# Patient Record
Sex: Male | Born: 1940
Health system: Southern US, Community
[De-identification: ages and names within clinical notes are randomized; demographics above are authoritative.]

## PROBLEM LIST (undated history)

## (undated) DIAGNOSIS — I219 Acute myocardial infarction, unspecified: Secondary | ICD-10-CM

## (undated) DIAGNOSIS — I639 Cerebral infarction, unspecified: Secondary | ICD-10-CM

## (undated) DIAGNOSIS — I2510 Atherosclerotic heart disease of native coronary artery without angina pectoris: Secondary | ICD-10-CM

## (undated) DIAGNOSIS — G8929 Other chronic pain: Secondary | ICD-10-CM

## (undated) DIAGNOSIS — M549 Dorsalgia, unspecified: Secondary | ICD-10-CM

## (undated) DIAGNOSIS — N189 Chronic kidney disease, unspecified: Secondary | ICD-10-CM

## (undated) DIAGNOSIS — M1990 Unspecified osteoarthritis, unspecified site: Secondary | ICD-10-CM

## (undated) DIAGNOSIS — E785 Hyperlipidemia, unspecified: Secondary | ICD-10-CM

## (undated) DIAGNOSIS — I10 Essential (primary) hypertension: Secondary | ICD-10-CM

## (undated) DIAGNOSIS — I714 Abdominal aortic aneurysm, without rupture, unspecified: Secondary | ICD-10-CM

## (undated) DIAGNOSIS — I5022 Chronic systolic (congestive) heart failure: Secondary | ICD-10-CM

## (undated) DIAGNOSIS — I779 Disorder of arteries and arterioles, unspecified: Secondary | ICD-10-CM

## (undated) DIAGNOSIS — J449 Chronic obstructive pulmonary disease, unspecified: Secondary | ICD-10-CM

## (undated) HISTORY — PX: EYE SURGERY: SHX253

## (undated) HISTORY — DX: Abdominal aortic aneurysm, without rupture: I71.4

## (undated) HISTORY — PX: CORONARY ANGIOPLASTY: SHX604

## (undated) HISTORY — DX: Abdominal aortic aneurysm, without rupture, unspecified: I71.40

## (undated) HISTORY — DX: Other chronic pain: G89.29

## (undated) HISTORY — PX: HERNIA REPAIR: SHX51

## (undated) HISTORY — DX: Dorsalgia, unspecified: M54.9

## (undated) HISTORY — DX: Atherosclerotic heart disease of native coronary artery without angina pectoris: I25.10

## (undated) HISTORY — DX: Essential (primary) hypertension: I10

## (undated) HISTORY — DX: Hyperlipidemia, unspecified: E78.5

## (undated) HISTORY — DX: Chronic systolic (congestive) heart failure: I50.22

## (undated) HISTORY — DX: Chronic obstructive pulmonary disease, unspecified: J44.9

---

## 1995-12-06 DIAGNOSIS — I219 Acute myocardial infarction, unspecified: Secondary | ICD-10-CM

## 1995-12-06 HISTORY — PX: CORONARY ARTERY BYPASS GRAFT: SHX141

## 1995-12-06 HISTORY — DX: Acute myocardial infarction, unspecified: I21.9

## 2002-05-10 ENCOUNTER — Encounter: Payer: Self-pay | Admitting: Emergency Medicine

## 2002-05-11 ENCOUNTER — Inpatient Hospital Stay (HOSPITAL_COMMUNITY): Admission: EM | Admit: 2002-05-11 | Discharge: 2002-05-22 | Payer: Self-pay | Admitting: Emergency Medicine

## 2002-05-16 ENCOUNTER — Encounter: Payer: Self-pay | Admitting: *Deleted

## 2002-12-07 ENCOUNTER — Emergency Department (HOSPITAL_COMMUNITY): Admission: EM | Admit: 2002-12-07 | Discharge: 2002-12-07 | Payer: Self-pay

## 2003-02-21 ENCOUNTER — Ambulatory Visit (HOSPITAL_COMMUNITY): Admission: RE | Admit: 2003-02-21 | Discharge: 2003-02-21 | Payer: Self-pay | Admitting: General Surgery

## 2003-03-25 ENCOUNTER — Ambulatory Visit (HOSPITAL_COMMUNITY): Admission: RE | Admit: 2003-03-25 | Discharge: 2003-03-25 | Payer: Self-pay | Admitting: Cardiology

## 2003-04-03 ENCOUNTER — Encounter: Payer: Self-pay | Admitting: Family Medicine

## 2003-04-03 ENCOUNTER — Ambulatory Visit (HOSPITAL_COMMUNITY): Admission: RE | Admit: 2003-04-03 | Discharge: 2003-04-03 | Payer: Self-pay | Admitting: Family Medicine

## 2003-12-06 HISTORY — PX: CYSTECTOMY: SUR359

## 2003-12-18 ENCOUNTER — Ambulatory Visit (HOSPITAL_COMMUNITY): Admission: RE | Admit: 2003-12-18 | Discharge: 2003-12-18 | Payer: Self-pay | Admitting: Family Medicine

## 2004-01-01 ENCOUNTER — Ambulatory Visit (HOSPITAL_COMMUNITY): Admission: RE | Admit: 2004-01-01 | Discharge: 2004-01-01 | Payer: Self-pay | Admitting: Family Medicine

## 2004-03-03 ENCOUNTER — Ambulatory Visit (HOSPITAL_COMMUNITY): Admission: RE | Admit: 2004-03-03 | Discharge: 2004-03-03 | Payer: Self-pay | Admitting: Family Medicine

## 2004-04-30 ENCOUNTER — Ambulatory Visit (HOSPITAL_COMMUNITY): Admission: RE | Admit: 2004-04-30 | Discharge: 2004-04-30 | Payer: Self-pay | Admitting: General Surgery

## 2008-11-19 ENCOUNTER — Ambulatory Visit (HOSPITAL_COMMUNITY): Admission: RE | Admit: 2008-11-19 | Discharge: 2008-11-19 | Payer: Self-pay | Admitting: Family Medicine

## 2009-09-30 ENCOUNTER — Ambulatory Visit (HOSPITAL_COMMUNITY): Admission: RE | Admit: 2009-09-30 | Discharge: 2009-09-30 | Payer: Self-pay | Admitting: Family Medicine

## 2009-10-12 ENCOUNTER — Ambulatory Visit (HOSPITAL_COMMUNITY): Admission: RE | Admit: 2009-10-12 | Discharge: 2009-10-12 | Payer: Self-pay | Admitting: Family Medicine

## 2010-06-28 ENCOUNTER — Ambulatory Visit (HOSPITAL_COMMUNITY): Admission: RE | Admit: 2010-06-28 | Discharge: 2010-06-28 | Payer: Self-pay | Admitting: Family Medicine

## 2011-02-10 ENCOUNTER — Other Ambulatory Visit (HOSPITAL_COMMUNITY): Payer: Self-pay | Admitting: Family Medicine

## 2011-02-10 DIAGNOSIS — I714 Abdominal aortic aneurysm, without rupture: Secondary | ICD-10-CM

## 2011-02-15 ENCOUNTER — Ambulatory Visit (HOSPITAL_COMMUNITY): Payer: Medicare Other

## 2011-03-01 ENCOUNTER — Ambulatory Visit (HOSPITAL_COMMUNITY)
Admission: RE | Admit: 2011-03-01 | Discharge: 2011-03-01 | Disposition: A | Discharge status: Home / Self Care | Payer: Medicare Other | Source: Ambulatory Visit | Attending: Family Medicine | Admitting: Family Medicine

## 2011-03-01 DIAGNOSIS — I714 Abdominal aortic aneurysm, without rupture, unspecified: Secondary | ICD-10-CM | POA: Insufficient documentation

## 2011-04-13 ENCOUNTER — Ambulatory Visit (HOSPITAL_COMMUNITY)
Admission: RE | Admit: 2011-04-13 | Discharge: 2011-04-13 | Disposition: A | Discharge status: Home / Self Care | Payer: Medicare Other | Source: Ambulatory Visit | Attending: Family Medicine | Admitting: Family Medicine

## 2011-04-13 ENCOUNTER — Other Ambulatory Visit: Payer: Self-pay | Admitting: Family Medicine

## 2011-04-13 DIAGNOSIS — M545 Low back pain, unspecified: Secondary | ICD-10-CM | POA: Insufficient documentation

## 2011-04-13 DIAGNOSIS — M51379 Other intervertebral disc degeneration, lumbosacral region without mention of lumbar back pain or lower extremity pain: Secondary | ICD-10-CM | POA: Insufficient documentation

## 2011-04-13 DIAGNOSIS — M5137 Other intervertebral disc degeneration, lumbosacral region: Secondary | ICD-10-CM | POA: Insufficient documentation

## 2011-04-22 NOTE — Cardiovascular Report (Signed)
[place]  Patient:    Peter [name], Peter [name] Visit Number: [id number] MRN: [id number]          Service Type: MED Location: [id number] Attending Physician:  Veneda Melter Dictated by:   Daisey Must, M.D. [facility] Proc. Date: 05/20/02 Admit Date:  05/10/2002   CC:         Lilyan Punt, M.D.  Arturo Morton. Riley Kill, M.D. [facility]   Cardiac Catheterization  PROCEDURE: 1. PTCA with stent placement utilizing the filter wire distal protection    device within the proximal portion of the saphenuos vein graft to the    posterior descending artery. 2. PTCA of the distal left anterior descending artery via the left internal    mammary artery.  INDICATIONS:  Peter [name] is a 70 year old male who has a history of prior coronary artery bypass graft surgery.  He underwent cardiac catheterization approximately one week ago which revealed an approximately 70 to 75% stenosis in the proximal portion of a sequential saphenuos vein graft to the posterior descending artery and posterolateral branch.  He also had diffuse disease in the distal LAD beyond the left internal mammary artery insertion.  There was a 90% followed by 99% followed by 95% in the very apical portion of the LAD.  We initially attempted medical therapy.  However, the patient had recurrent substernal chest pain with unstable angina symptoms in the hospital inspite of ongoing treatment with intravenous nitroglycerin.  With one of his episodes of chest pain, he had T wave inversions in leads V5 and V6.  Inspite of attempts at adjusting medications, he had refractory chest pain.  We therefore did a Cardiolite scan with chest pain.  This showed a significant perfusion defect in the apex which resolved on reimaging with resolution of his chest pain. There was also a question of some mild inferior reversibility.  After extensive review of these findings, we opted to proceed with percutaneous coronary intervention  because of the patients refractory chest pain.  Of note, the disease in the distal and apical LAD was very diffuse in nature and the vessel itself was very small and thus not ideal for percutaneous coronary intervention.  However, because of the patients refractory symptoms, we proceeded with percutaneous coronary intervention.  DESCRIPTION OF PROCEDURE:  A 7 French sheath was placed in the right femoral artery.  Angiomax was administered per protocol.  We initially treated the sequential vein graft to the posterior descending artery and posterolateral branch.  We used a 7 [place] multipurpose guiding catheter with sideholes.  A filter wire distal protection device was deployed in the midportion of the vein graft beyond the lesion site.  We then deployed a 3.0 x 33 mm Cypher drug-eluding stent across the lesion in the proximal portion of the vein graft at a deployment pressure of 20 atmospheres.  There was significant resistance in the midportion of the lesion to balloon inflation.  We went back with a 3.5 x 15 mm Quantum balloon and positioned it in the proximal aspect of the stent inflating it to 16 atmospheres.  We then put it in the midportion of the stent inflating it to 20 atmospheres.  We then positioned in the distal portion of the stent inflating it to 12 atmospheres.  Finally, we pulled it back again and positioned it in the midportion of the stent inflating it to 22 atmospheres.  The filter device was then retrieved.  Final angiographic images were obtained revealed patency of the saphenuos  vein graft with 20% residual stenosis and TIMI 3 flow.  We then turned our attention to the left anterior descending artery.  We used a 6 [place] internal mammary guiding catheter.  We initially attempted to cross the lesion with a Hi-Torque floppy wire, however, due to significant tortuosity of the vein graft, this wire would not pass beyond the lesion.  We therefore used a Riley Kill PT wire and  were successful in advancing this beyond the stenosis in the distal LAD and the tip of the wire was positioned in the apical LAD.  We then advanced a 2.0 x 20 mm Maverick balloon over this wire.  We initially positioned this balloon across the second of the three lesions in the mid LAD inflating it to 9 atmospheres.  We then pulled the balloon back and positioned it across the more proximal of the three lesions and inflated it to 10 atmospheres.  We then readvanced the balloon into the more distal of the three lesions inflating it to 9 atmospheres.  We then pulled it back again in the midlesion inflating it to 9 atmospheres and then finally again in the proximal lesion inflating it to 9 atmospheres.  Final angiographic images were then obtained revealing patency of the distal and apical LAD with 20% residual stenosis in the more proximal of three lesions, 25% residual stenosis in the mid of the three lesions, and 30% residual stenosis with haziness in the more distal apical of the three lesions in the distal LAD.  There was TIMI 3 flow into the distal vessel.  COMPLICATIONS:  None.  RESULTS: 1. Successful PTCA with stent placement in the proximal portion of the    sequential saphenuos vein graft to the posterior descending artery and    posterolateral branch.  A filter wire distal protection device was    utilized. A 75% stenosis was reduced to 20% residual with TIMI 3 flow    utilizing a drug-eluding stent. 2. Successful PTCA in multiple sites as described in the distal and apical    LAD. A 90% stenosis in the distal LAD was reduced to 20% residual, a 99%    stenosis was reduced to 25% residual, and a 95% stenosis in the apical    LAD was reduced to 30% residual with TIMI 3 flow into the distal vessel.  PLAN:  Angiomax will be discontinued at this point.  The patient will be continued Ticlopidine for a total of six months as he apparently has developed a Plavix allergy. Dictated by:    Daisey Must, M.D. [facility]  Attending Physician:  Veneda Melter DD:  05/20/02 TD:  05/22/02 Job: [id number]

## 2011-04-22 NOTE — Op Note (Signed)
   NAME:  Peter [name], Peter [name]                          ACCOUNT NO.:  [id number]   MEDICAL RECORD NO.:  [id number]                   PATIENT TYPE:  AMB   LOCATION:  DAY                                  FACILITY:  [facility]   PHYSICIAN:  Dalia Heading, M.D.               DATE OF BIRTH:  [birth year]   DATE OF PROCEDURE:  02/21/2003  DATE OF DISCHARGE:                                 OPERATIVE REPORT   PREOPERATIVE DIAGNOSIS:  Enlarging sebaceous cysts, scalp.   POSTOPERATIVE DIAGNOSIS:  Enlarging sebaceous cysts, scalp.   PROCEDURE:  Excision of 6-cm sebaceous cysts, scalp.   SURGEON:  Dalia Heading, M.D.   ANESTHESIA:  MAC.   INDICATIONS FOR PROCEDURE:  The patient is a 70 year old white male who  presents with two enlarging sebaceous cysts on the top of his scalp.  They  measure approximately 6 cm in greatest diameter.  They are starting to cause  discomfort and irritation.  The risks and benefits of the procedure were  fully explained to the patient who gave informed consent.   PROCEDURE:  The patient was placed in the supine position.  The hair over  the sebaceous cysts was shaved.  The area was prepped and draped using the  usual sterile technique with Betadine.  Xylocaine 1% with epinephrine was  used for local anesthesia.   An elliptical incision was made over the sebaceous cysts.  Both were removed  in total without difficulty.  The wound was irrigated with normal saline.  Excess skin was then excised in order to facilitate adequate closure.  The  skin edges were reapproximated using 4-0 nylon interrupted sutures.  Neosporin ointment was then applied.   All tape and needle counts were correct at the end of the procedure.  The  patient was transferred to the PACU in stable condition.   COMPLICATIONS:  None.    SPECIMENS:  Sebaceous cysts, scalp.   ESTIMATED BLOOD LOSS:  Minimal.                                               Dalia Heading, M.D.    MAJ/MEDQ  D:   02/21/2003  T:  02/21/2003  Job:  [id number]   cc:   Lorin Picket A. Gerda Diss, M.D.  [address], [address]  [place]  [place] [address]  Fax: [phone]

## 2011-04-22 NOTE — H&P (Signed)
   NAME:  Peter [name], Peter [name] NO.:  [id number]   MEDICAL RECORD NO.:  [id number]                    PATIENT TYPE:   LOCATION:                                       FACILITY:   PHYSICIAN:  Dalia Heading, M.D.               DATE OF BIRTH:   DATE OF ADMISSION:  DATE OF DISCHARGE:                                HISTORY & PHYSICAL   CHIEF COMPLAINT:  Enlarging cysts, chin and neck.   HISTORY OF PRESENT ILLNESS:  Patient is a 70 year old, white male who  presents with 2 enlarging sebaceous cysts on his chin and neck.  They have  been present for some time, but they are causing increasing discomfort.  No  purulent drainage has been noted.   PAST MEDICAL HISTORY:  Hypertension, atherosclerotic heart disease.   PAST SURGICAL HISTORY:  Excision of sebaceous cysts on scalp in March 2004,  CABG 1997.   CURRENT MEDICATIONS:  1. Atenolol 100 mg p.o. daily.  2. Another unknown blood pressure medication.   ALLERGIES:  No known drug allergies.   REVIEW OF SYSTEMS:  Patient has quit smoking.  Denies any recent angina,  shortness of breath, leg swelling.  He denies any CVA history.  He denies  any bleeding disorders.   PHYSICAL EXAMINATION:  General:  Patient is a well-developed, well-  nourished, white male in no acute distress.  He is afebrile and vital signs  are stable.  Lungs:  Clear to auscultation with equal breath sounds  bilaterally.  Heart:  Reveals a regular rate and rhythm without S3, S4, or  murmurs.  Skin:  Reveals a 2-cm cystic, inflamed sebaceous cyst just below  the chin and to the right.  A 3-cm tender, cystic subcutaneous mass is noted  on the left neck base posteriorly.   IMPRESSION:  Sebaceous cysts, chin and neck.    PLAN:  The patient is scheduled for excision of the masses, chin and neck,  on 08/20/2003.  The risks and benefits of the procedure were fully explained  to the patient, gave informed consent.                     Dalia Heading, M.D.    MAJ/MEDQ  D:  08/14/2003  T:  08/14/2003  Job:  [id number]   cc:   Lorin Picket A. Gerda Diss, M.D.  [address], [address]  [place]  [place] [address]  Fax: [phone]

## 2011-04-22 NOTE — Op Note (Signed)
NAME:  Peter [name], Peter [name]                          ACCOUNT NO.:  [id number]   MEDICAL RECORD NO.:  [id number]                   PATIENT TYPE:  AMB   LOCATION:  DAY                                  FACILITY:  [facility]   PHYSICIAN:  Dalia Heading, M.D.               DATE OF BIRTH:  Sep 10, 1941   DATE OF PROCEDURE:  DATE OF DISCHARGE:  04/30/2004                                 OPERATIVE REPORT   PREOPERATIVE DIAGNOSIS:  Sebaceous cysts, neck, and chin.   POSTOPERATIVE DIAGNOSIS:  Sebaceous cysts, neck, and chin.   PROCEDURE:  Excision of 2-cm sebaceous cyst of chin and excision of 3-cm  sebaceous cyst, neck.   SURGEON:  Dalia Heading, M.D.   ANESTHESIA:  General.   INDICATIONS:  The patient is a 70 year old white male who has 2 enlarging  sebaceous cysts on his chin and neck. The risks and benefits of the  procedure were fully explained to the patient, who gave informed consent.   DESCRIPTION OF PROCEDURE:  The patient was placed in the supine position.  After general anesthesia was administered, the chin and left posterior neck  regions were prepped and draped using the usual sterile technique with  Betadine.  Surgical site confirmation was performed.   An incision was made over the sebaceous cyst of the chin.  The sebaceous  cyst was excised without difficulty.  The skin was closed using a 5-0 nylon  interrupted suture.  The sebaceous cyst on the posterior left neck was  likewise excised without difficulty.  The skin was closed using a 4-0 nylon  interrupted suture.  Sensorcaine 0.5% were instilled in both wounds.  Both  wounds were bacitracin ointment and dry sterile dressing.   All tape and needle counts were correct at the end of the procedure.  The  patient was awakened and transferred to PACU in stable condition.   COMPLICATIONS:  None.   SPECIMENS:  Sebaceous cysts, neck and chin.   BLOOD LOSS:  None.      ___________________________________________                            Dalia Heading, M.D.   MAJ/MEDQ  D:  04/30/2004  T:  05/01/2004  Job:  [id number]   cc:   Dalia Heading, M.D.  [address], [place]  [place] [address]  Fax: [phone]   Peter [name] A. Gerda Diss, M.D.  [address], [address]  [place]  [place] [address]  Fax: [phone]

## 2011-04-22 NOTE — Discharge Summary (Signed)
[place]  Patient:    Peter [name], Peter [name] Visit Number: [id number] MRN: [id number]          Service Type: MED Location: [id number] Attending Physician:  Veneda Melter Dictated by:   Lavella Hammock, P.A. Admit Date:  05/10/2002 Disc. Date: 05/22/02   CC:         Arturo Morton. Riley Kill, M.D. [facility]  Mel Almond, M.D.   Referring Physician Discharge [place]  DATE OF BIRTH:  [birth year]  PROCEDURES: 1. Cardiac catheterization. 2. Coronary arteriogram. 3. Left ventriculogram. 4. Percutaneous intervention and stent to one vessel. 5. Nuclear medicine myocardial study.  HISTORY OF PRESENT ILLNESS:  The patient is a 70 year old male with a history of bypass surgery in 1997, who was admitted to the hospital on May 10, 2002, for substernal chest pain that was persistent the day before admission and was relieved in the emergency room with IV nitroglycerin and anticoagulants.  HOSPITAL COURSE:  He was admitted, placed on IV heparin and nitroglycerin, and evaluated for MI. His enzymes were negative for MI, but he was scheduled for a cardiac catheterization.  The cardiac catheterization showed a totaled LAD and a left main with 40-50% stenosis.  The circumflex had 50-60% stenoses and the ramus intermedius was occluded.  The OM1 had a 90% stenosis at the origin. The RCA had 50% then 70% and again 70% stenoses.  The LIMA to LAD was patent, but there was diffuse disease in the distal graft and in the distal LAD there was a 90% stenosis.  SVG to D1 and ramus intermedius was patent with a 20% stenosis and an SVG to OM1 and OM2 was patent as well.  SVG to PDA and PLA had a 60-70% stenosis in the proximal portion.  His EF was 60% with 1+ MR.  The films were evaluated by Daisey Must, M.D., and Cecil Cranker, M.D., who felt that medical therapy was indicated.  However, over the course of the next few days, the patient continued to have episodes of chest pain that  were relieved with increasing nitroglycerin and with morphine.  Each time the patients nitroglycerin was decreased or discontinued, he would have recurrent pain.  The films were reviewed again and it was felt that possibly intervention to the SVG to RCA (PDA/PLA) might benefit the patient.  This was performed on May 20, 2002.  Additionally, he had intervention on the distal LAD with PTCA, reducing one stenosis from 90% to 20%, another one from 99% to 25%, and another one from 95% to approximately 30%.  There was TIMI-3 flow post procedure.  The patient was treated with Angiomax and started on Ticlid, which was to be continued for six months.  Post procedure, the patient was doing well and ambulating with cardiac rehabilitation without chest pain.  He will be offered the option of cardiac rehabilitation in the [place], [place], area.  The patient developed a rash after admission and after he had been placed on Plavix.  It was felt that this medication was responsible and he was started on Benadryl p.o., as well as hydrocortisone cream.  This controlled his symptoms fairly well.  He was advised that if the symptoms continued for another four to five days after discharge and for a total of seven days after the medication was discontinued, he should see cardiology or his family physician for the possibility of a Ticlid reaction as well.  By May 22, 2002, the patient was ambulating without chest  pain and his medical therapy was felt to be stable.  He had follow-up arranged and was considered stable for discharge on May 22, 2002.  LABORATORY DATA:  Sodium 136, potassium 3.4, chloride 102, CO2 30, BUN 13, creatinine 1.2.  The glucose peak was 214.  Hemoglobin 14.0, hematocrit 40.1, WBC 7.2, platelets 124.  Hemoglobin A1C 6.4%.  Total cholesterol 171, triglycerides 108, HDL 41, LDL 108.  TSH 9.269 with a free T4 of 1.34 and a free T3 of 2.6.  Chest x-ray:  Left lower lobe  atelectasis versus bronchopneumonia, airway thickening potentially representing underlying bronchitis.  CONDITION ON DISCHARGE:  Improved.  DISCHARGE DIAGNOSES:  1. Coronary artery disease, status post percutaneous transluminal coronary     angioplasty and stent to the saphenous vein graft to right coronary artery     this admission, as well as percutaneous transluminal coronary angioplasty     x 3 to the left anterior descending.  2. Status post aortocoronary bypass surgery in 1997 with left internal     mammary artery to left anterior descending, saphenous vein graft to first     obtuse marginal and second obtuse marginal, saphenous vein graft to first     diagonal and ramus intermedius, and saphenous vein graft to posterior     descending artery and posterolateral artery.  3. Preserved left ventricular function with an ejection fraction of 60% and     1+ mitral regurgitation at catheterization.  4. Diet controlled diabetes.  5. Hyperlipidemia.  6. Hypokalemia (supplemented).  7. Hypertension.  8. Status post hernia repair.  9. Osteoarthritis. 10. History of tinnitus. 11. History of allergic reaction to Plavix.  ACTIVITY:  His activity is to include no driving or sexual or strenuous activity for two days and then increase gradually according to the rehabilitation guidelines.  DIET:  He is to stick to a low-fat diabetic diet.  WOUND CARE:  He is to call the office for problems with the catheterization site.  FOLLOW-UP:  He has an appointment with the [facility] on Tuesday, June 04, 2002, at 11 a.m.  He is to follow up with W. Lilyan Punt, M.D., as scheduled.  DISCHARGE MEDICATIONS: 1. Coated aspirin 325 mg q.d. 2. Altace 5 mg q.d. 3. Lipitor 10 mg q.d. 4. Tenormin 100 mg q.d. 5. Ticlid 250 mg b.i.d. for six months. 6. Benadryl 25-50 mg three to four times a day p.r.n. 7. Hydrocortisone cream p.r.n.  8. Nitroglycerin 0.4 mg sublingual  p.r.n. Dictated by:   Lavella Hammock, P.A. Attending Physician:  Veneda Melter DD:  05/22/02 TD:  05/22/02 Job: [id number]

## 2011-04-22 NOTE — H&P (Signed)
   NAME:  Peter [name], Peter [name] NO.:  [id number]   MEDICAL RECORD NO.:  [id number]                  PATIENT TYPE:   LOCATION:                                       FACILITY:   PHYSICIAN:  Dalia Heading, M.D.               DATE OF BIRTH:  1941/04/12   DATE OF ADMISSION:  02/21/2003  DATE OF DISCHARGE:                                HISTORY & PHYSICAL   CHIEF COMPLAINT:  Enlarging sebaceous cyst, scalp.   HISTORY OF PRESENT ILLNESS:  The patient is a 70 year-old white male who  presents with two enlarging sebaceous cysts on the scalp.  They have been  present for some time.  They measure approximately 6 cm in greatest  diameter.  They are starting to cause discomfort.   PAST MEDICAL HISTORY:  1. Hypertension.  2. Atherosclerotic heart disease.   PAST SURGICAL HISTORY:  Coronary artery bypass grafting in 1997.   CURRENT MEDICATIONS:  Atenolol 100 mg p.o. daily.   ALLERGIES:  No known drug allergies.   REVIEW OF SYMPTOMS:  PULMONARY:  The patient has quit smoking.  CARDIAC:  He  denies any recent angina, shortness of breath or leg swelling.  NEUROLOGIC:  He denies CVA.  ENDOCRINE:  He denies any diabetes mellitus.  HEMATOLOGIC:  He denies any bleeding disorder.   PHYSICAL EXAMINATION:  GENERAL:  The patient is a well developed, well  nourished white male in no acute distress.  VITAL SIGNS:  He is afebrile and vital signs are stable.  HEENT EXAM:  Scalp examination reveals two large sebaceous cysts on the top  portion of the scalp.  Mild erythema is noted.  No purulent drainage is  noted.  LUNGS:  Clear to auscultation with equal breath sounds bilaterally.  HEART EXAM:  Regular rate and rhythm without an S3, S4 or murmurs.   IMPRESSION:  Enlarging sebaceous cysts, irritated, scalp.    PLAN:  The patient is scheduled for excision of the sebaceous cysts of the  scalp on February 21, 2003.  The risks and benefits of the procedure including  bleeding,  infection or recurrence of the cysts were fully explained to the  patient.  He gave informed consent.                                               Dalia Heading, M.D.    MAJ/MEDQ  D:  02/11/2003  T:  02/11/2003  Job:  [id number]   cc:   Lorin Picket A. Gerda Diss, M.D.  [address], [address]  [place]  [place] [address]  Fax: [phone]

## 2011-04-22 NOTE — H&P (Signed)
Peter [name], Peter [name] NO.:  [id number]   MEDICAL RECORD NO.:  [id number]                  PATIENT TYPE:   LOCATION:                                       FACILITY:   PHYSICIAN:  Dalia Heading, M.D.               DATE OF BIRTH:  04/10/1941   DATE OF ADMISSION:  DATE OF DISCHARGE:                                HISTORY & PHYSICAL   CHIEF COMPLAINT:  Enlarging cysts, chin and neck.   HISTORY OF PRESENT ILLNESS:  The patient is a 70 year old white male who  presents with two enlarging sebaceous cysts on his chin and neck.  They have  been present for some time, but they are causing increasing discomfort.  No  recent purulent drainage has been noted.   PAST MEDICAL HISTORY:  1. Hypertension.  2. Atherosclerotic heart disease.  3. Non-insulin-dependent diabetes mellitus.   PAST SURGICAL HISTORY:  1. Excision of multiple cysts on the scalp in March 2004.  2. CABG in 1997.   CURRENT MEDICATIONS:  1. Atenolol 100 mg p.o. q. day.  2. Diovan 160 mg p.o. q. day.  3. Hydrochlorothiazide 25 mg p.o. q. day.  4. Glucophage 1 gram p.o. b.i.d.  5. Lipitor 10 mg p.o. q. day.   ALLERGIES:  No known drug allergies.   REVIEW OF SYSTEMS:  The patient does drink alcohol weekly.  He denies  tobacco use.  He denies any recent MI, chest pain, shortness of breath, CVA  or bleeding disorders.   PHYSICAL EXAMINATION:  GENERAL:  The patient is a well-developed, well-  nourished white male in no acute distress.  VITAL SIGNS:  He is afebrile and vital signs are stable.  HEENT:  A 2 cm sebaceous, inflamed cyst just below the chin.  He also has a  3 cm tender cystic subcutaneous mass noted along the left posterior base of  the neck.  LUNGS:  Clear to auscultation with equal breath sounds bilaterally.  HEART:  Regular rate and rhythm without S3, S4, or murmurs.   IMPRESSION:  Probable sebaceous cysts, chin and neck.   PLAN:  The patient is scheduled for an excision  of the masses, chin and  neck, on Apr 30, 2004.  The risks and benefits of the procedures including  bleeding, infection, and recurrence of the cysts were fully explained to the  patient and he gave informed consent.     ___________________________________________                                         Dalia Heading, M.D.   MAJ/MEDQ  D:  04/27/2004  T:  04/27/2004  Job:  [id number]   cc:   Dalia Heading, M.D.  [address] [place]  [place] [address]  Fax: [phone]  Scott A. Gerda Diss, M.D.  [address]  [place]  [place] [address]  Fax: [phone]

## 2011-04-22 NOTE — Cardiovascular Report (Signed)
[place]. [facility]  Patient:    Peter [name], Peter [name] Visit Number: [id number] MRN: [id number]          Service Type: MED Location: [phone] Attending Physician:  Veneda Melter Dictated by:   Daisey Must, M.D. [facility] Proc. Date: 05/13/02 Admit Date:  05/10/2002   CC:         Lilyan Punt, M.D.  Arturo Morton. Riley Kill, M.D. [facility]  Cardiac Catheterization Laboratory   Cardiac Catheterization  PROCEDURES PERFORMED: Left heart catheterization with coronary angiography, left ventriculography, and bypass graft angiography.  INDICATIONS: The patient is a 70 year old male with a history of prior coronary artery bypass surgery in 1997. He presented to the hospital with chest pain and was referred for cardiac catheterization.  DESCRIPTION OF PROCEDURE: A 6 French sheath was placed in the right femoral artery.  Standard Judkins 6 French catheters were utilized for the native   r arteries. The two left bypass grafts were injected with a JL4 catheter. The bypass graft to the right coronary artery was injected with an RCB catheter. The left internal mammary artery was visualized with an internal mammary catheter. Left ventriculography was performed with an angled pigtail catheter. Contrast was Omnipaque. There were no complications.  RESULTS:  HEMODYNAMICS: Left ventricular pressure 174/23.  Aortic pressure 174/100. There was no aortic valve gradient.  LEFT VENTRICULOGRAM: There is mild akinesis of the apex, otherwise wall motion is normal. Ejection fraction estimated at 60%. There is 1+ mild mitral regurgitation.  ABDOMINAL AORTOGRAM: Abdominal aortogram reveals moderate diffuse disease of the abdominal aorta and iliac arteries with diffuse ectasia of the aorta and iliac arteries. The renal arteries are not clearly visualized but appear to be patent. There may be moderate disease in the inferior of two left renal arteries.  CORONARY ARTERIOGRAPHY: (Right  dominant).  Left main has a 40% stenosis proximally.  Left anterior descending artery has a 50% stenosis at its origin followed by 100% occlusion of the proximal vessel.  Left circumflex has a 60% stenosis in the mid vessel and a diffuse 50% stenosis in the distal vessel. The circumflex gives rise to a normal sized ramus intermedius which is 100% occluded. There is a normal sized first obtuse marginal branch which has a 90% stenosis at its origin. There is also a normal sized second obtuse marginal branch.  The right coronary artery is diffusely diseased with a 50% stenosis at its origin followed by 70% in the proximal vessel. The mid vessel has a 70% followed by a 40% stenosis. In the distal AV groove portion of the right coronary artery, there are tandem 95% stenoses. The distal right coronary gives rise to a normal sized posterior descending artery and a normal sized posterolateral branch.  Left internal mammary artery to the distal LAD is patent. However, the distal LAD itself is diffusely diseased. In the distal and apical LAD there is 90% stenosis followed by tandem 95% stenoses in the apical LAD. Also proximal to the mammary artery insertion in the mid LAD, there is a 70% stenosis.  Sequential saphenous vein graft to the first diagonal branch and ramus intermediate is patent with a 20% stenosis in the proximal portion of this vein graft. This fills a small diagonal branch and a normal sized ramus intermediate.  Sequential saphenous vein graft to the first and second obtuse marginal branch is patent with diffuse ectasia of the vein graft but no significant stenoses.  Sequential saphenous vein graft to the posterior descending artery and  the posterolateral branch has a tubular 60-70% stenosis in the proximal portion of this vein graft. The graft is otherwise patent filling a normal sized posterior descending artery and a normal sized posterolateral branch.  IMPRESSIONS: 1.  Preserved left ventricular systolic function with mild mitral    regurgitation. 2. Native three-vessel coronary artery disease. 3. Status post coronary artery bypass surgery as described. All grafts are    patent. There is moderate disease in the proximal portion of the    saphenous vein graft to the posterior descending artery and posterolateral    branch. This appears to be of borderline severity. In addition, there is    diffuse disease in the distal left anterior descending beyond the internal    mammary graft insertion. It appears this may be the culprit for the    patients chest pain.  PLAN: An adenosine Cardiolite will be obtained to identify and localize ischemia. If this does confirm the presence of only apical ischemia, the patient will be managed medically. However, if there is significant ischemia seen in the inferior wall on this scan, then the saphenous vein graft to the posterior descending artery could be treated with percutaneous intervention. Dictated by:   Daisey Must, M.D. [facility] Attending Physician:  Veneda Melter DD:  05/13/02 TD:  05/15/02 Job: [id number]

## 2011-04-22 NOTE — H&P (Signed)
[place]. [facility]  Patient:    Peter [name], Peter [name] Visit Number: [id number] MRN: [id number]          Service Type: MED Location: [phone] Attending Physician:  Veneda Melter Dictated by:   Veneda Melter, M.D. Admit Date:  05/10/2002   CC:         Arturo Morton. Riley Kill, M.D. [facility]  Lilyan Punt, M.D.   History and Physical  CHIEF COMPLAINT: Chest pain.  HISTORY OF PRESENT ILLNESS: The patient is a 70 year old white male with a history of hypertension and coronary artery disease, who underwent coronary artery bypass graft surgery in 1997 who presents with substernal chest discomfort. The patient notes that over the past one week he has had some mild vague sternal discomfort that has not been associated with activity, rest, position, or other exacerbating factors. Yesterday he had a persistent episode in the right arm that was self-limiting and today he had chest pain on and off in the left chest and occasionally in the right arm as well. This was not associated with shortness of breath, nausea or vomiting. He had no syncope or presyncope. The patient was in [place] at the time and drove back and during the four hour ride had 3-4 episodes. He denies any exacerbation with position or exertion. He was especially concerned, however, about discomfort that he presented to the emergency room. Currently, he is pain-free after initiation of IV nitroglycerin and anticoagulants. He has not had any recent bright red blood per rectum, melena, hematuria, or dysuria. No syncope or presyncope. No orthopnea, paroxysmal nocturnal dyspnea, or lower extremity edema.  REVIEW OF SYSTEMS: Noncontributory.  PAST MEDICAL HISTORY: Notable for hypertension as well as coronary artery bypass graft surgery in 1997. At that time his discomfort started in his upper abdomen and then radiated up to his chest and was severe and persistent prompting his presentation. He also complains  that he has recent onset diabetes mellitus.  ALLERGIES: None.  MEDICATIONS: Atenolol 75 mg per day and aspirin when he can remember to take it.  SOCIAL HISTORY: The patient lives in [place]. He has been married for 42 years, has three children and no medical problems. He is a self-employed Education administrator. He has a long history of tobacco use of three packs-per-day for 40 years, quit approximately five years ago and also has moderate alcohol use. Denies drug use.  FAMILY HISTORY: Father died at the age of 40 with brain tumor, and was otherwise healthy. Mother died at the age of 63, unknown sudden cause. One sister, age 82 with a history of stokes.  PHYSICAL EXAMINATION:  GENERAL: On examination, he is a well-developed, well-nourished white male in no acute distress.  VITAL SIGNS: Blood pressure initially of 201/167, currently 134/85. Pulse 70, respirations 22. O2 saturations 99% on 2 L. Temperature 98.2.  HEENT: Pupils equal, round, and reactive to light. Extraocular muscles are intact. Oropharynx shows no lesions.  NECK: Supple without bruits.  HEART: Reveals regular rate and rhythm without murmurs.  LUNGS: Clear to auscultation.  ABDOMEN: Soft, nontender. Chest wall shows a well-healed sternal scar, no tenderness.  EXTREMITIES: Well-healed right lower extremity graft harvest site. There is no peripheral edema. Pulses are diminished but palpable. His motor strength is 5/5. Sensory is intact.  His cranial nerves II-XII intact.  ECG shows normal sinus rhythm of 64 and no acute ischemic changes. Chest x-ray shows mild cardiac enlargement, no infiltration or infusion. Laboratory data is pending.  ASSESSMENT AND PLAN: The  patient is a 70 year old white male with a known history of coronary artery disease, status post coronary artery bypass graft surgery who presents with a one-week history of crescendo chest discomfort with radiation to the arm. This pain is somewhat reminsant  of his prior discomfort, although not as severe. We will admit the patient to telemetry for monitoring of his unstable angina. Will start him on aspirin and Plavix. Continue his beta blocker and control his blood pressure symptoms with IV nitroglycerin. I will also start him on Lovenox. We will proceed with cardiac catheterization and possible percutaneous intervention if schedule permits. The risks, benefits and alternatives and such were discussed with the patient and he understands and agrees to proceed. Dictated by:   Veneda Melter, M.D. Attending Physician:  Veneda Melter DD:  05/11/02 TD:  05/13/02 Job: [id number]

## 2011-10-31 ENCOUNTER — Other Ambulatory Visit: Payer: Self-pay | Admitting: Family Medicine

## 2011-10-31 DIAGNOSIS — I714 Abdominal aortic aneurysm, without rupture: Secondary | ICD-10-CM

## 2011-11-02 ENCOUNTER — Other Ambulatory Visit: Payer: Self-pay | Admitting: Family Medicine

## 2011-11-02 ENCOUNTER — Ambulatory Visit (HOSPITAL_COMMUNITY)
Admission: RE | Admit: 2011-11-02 | Discharge: 2011-11-02 | Disposition: A | Discharge status: Home / Self Care | Payer: Medicare Other | Source: Ambulatory Visit | Attending: Family Medicine | Admitting: Family Medicine

## 2011-11-02 DIAGNOSIS — I714 Abdominal aortic aneurysm, without rupture, unspecified: Secondary | ICD-10-CM | POA: Insufficient documentation

## 2011-11-02 DIAGNOSIS — Z09 Encounter for follow-up examination after completed treatment for conditions other than malignant neoplasm: Secondary | ICD-10-CM | POA: Insufficient documentation

## 2012-06-18 ENCOUNTER — Other Ambulatory Visit: Payer: Self-pay | Admitting: Family Medicine

## 2012-06-18 DIAGNOSIS — I729 Aneurysm of unspecified site: Secondary | ICD-10-CM

## 2012-06-21 NOTE — H&P (Signed)
  NTS SOAP Note  Vital Signs:  Vitals as of: 06/21/2012: Systolic 152: Diastolic 96: Heart Rate 65: Temp 96.80F: Height 34ft 11in: Weight 197Lbs 0 Ounces: BMI 27  BMI : 27.48 kg/m2  Subjective: This 71 Years 2 Months old Male presents for cysts on the scalp and back.  Have been present for some time, but both are enlarging in size.  No recent drainage noted.  Review of Symptoms:  Constitutional:unremarkable   Head:unremarkable    Eyes:unremarkable   Nose/Mouth/Throat:unremarkable Cardiovascular:  unremarkable   Respiratory:unremarkable   Gastrointestinal:  unremarkable   Genitourinary:unremarkable     Musculoskeletal:unremarkable   as noted Hematolgic/Lymphatic:unremarkable     Allergic/Immunologic:unremarkable     Past Medical History:    Reviewed   Past Medical History  Surgical History: CABG 1997 Medical Problems:  Diabetes, High Blood pressure Allergies: nkda Medications: lisinopril, atenolol   Social History:Reviewed  Social History  Preferred Language: English ([place]) Race:  White Ethnicity: Not Hispanic / Latino Age: 71 Years 2 Months Marital Status:  M Alcohol: socially Recreational drug(s):  No   Smoking Status: Never smoker reviewed on 06/21/2012  Family History:  Reviewed   Family History  Is there a family history [id number]    Objective Information: General:  Well appearing, well nourished in no distress.      3cm sebaceous cyst noted on scalp superiorly and right lateral, small punctum noted over left scapula towards midline.  Both not currently draining. Heart:  RRR, no murmur Lungs:    CTA bilaterally, no wheezes, rhonchi, rales.  Breathing unlabored.  Assessment:Sebaceous cysts, back and scalp  Diagnosis &amp; Procedure: DiagnosisCode: 706.2, ProcedureCode: 69629,    Plan:Scheduled for excision of cysts, back and scalp on 07/09/12.   Patient  Education:Alternative treatments to surgery were discussed with patient (and family).  Risks and benefits  of procedure were fully explained to the patient (and family) who gave informed consent. Patient/family questions were addressed.  Follow-up:Pending Surgery

## 2012-06-22 ENCOUNTER — Encounter (HOSPITAL_COMMUNITY): Payer: Self-pay | Admitting: Pharmacy Technician

## 2012-06-25 ENCOUNTER — Ambulatory Visit (HOSPITAL_COMMUNITY): Payer: Medicare Other

## 2012-06-27 ENCOUNTER — Ambulatory Visit (HOSPITAL_COMMUNITY)
Admission: RE | Admit: 2012-06-27 | Discharge: 2012-06-27 | Disposition: A | Discharge status: Home / Self Care | Payer: Medicare Other | Source: Ambulatory Visit | Attending: Family Medicine | Admitting: Family Medicine

## 2012-06-27 DIAGNOSIS — I729 Aneurysm of unspecified site: Secondary | ICD-10-CM

## 2012-06-27 DIAGNOSIS — I714 Abdominal aortic aneurysm, without rupture, unspecified: Secondary | ICD-10-CM | POA: Insufficient documentation

## 2012-06-27 DIAGNOSIS — Z09 Encounter for follow-up examination after completed treatment for conditions other than malignant neoplasm: Secondary | ICD-10-CM | POA: Insufficient documentation

## 2012-07-02 ENCOUNTER — Encounter (HOSPITAL_COMMUNITY)
Admission: RE | Admit: 2012-07-02 | Discharge: 2012-07-02 | Disposition: A | Discharge status: Home / Self Care | Payer: Medicare Other | Source: Ambulatory Visit | Attending: General Surgery | Admitting: General Surgery

## 2012-07-02 ENCOUNTER — Encounter (HOSPITAL_COMMUNITY): Payer: Self-pay

## 2012-07-02 HISTORY — DX: Acute myocardial infarction, unspecified: I21.9

## 2012-07-02 LAB — BASIC METABOLIC PANEL
BUN: 11 mg/dL (ref 6–23)
CO2: 27 mEq/L (ref 19–32)
Calcium: 9.6 mg/dL (ref 8.4–10.5)
Chloride: 102 mEq/L (ref 96–112)
Creatinine, Ser: 1.05 mg/dL (ref 0.50–1.35)
GFR calc Af Amer: 81 mL/min — ABNORMAL LOW (ref >90)
GFR calc non Af Amer: 70 mL/min — ABNORMAL LOW (ref >90)
Glucose, Bld: 198 mg/dL — ABNORMAL HIGH (ref 70–99)
Potassium: 4.4 mEq/L (ref 3.5–5.1)
Sodium: 139 mEq/L (ref 135–145)

## 2012-07-02 LAB — CBC WITH DIFFERENTIAL/PLATELET
Basophils Absolute: 0 10*3/uL (ref 0.0–0.1)
Basophils Relative: 0 % (ref 0–1)
Eosinophils Absolute: 0.3 10*3/uL (ref 0.0–0.7)
Eosinophils Relative: 4 % (ref 0–5)
HCT: 43.7 % (ref 39.0–52.0)
Hemoglobin: 15.6 g/dL (ref 13.0–17.0)
Lymphocytes Relative: 42 % (ref 12–46)
Lymphs Abs: 2.8 10*3/uL (ref 0.7–4.0)
MCH: 34.4 pg — ABNORMAL HIGH (ref 26.0–34.0)
MCHC: 35.7 g/dL (ref 30.0–36.0)
MCV: 96.5 fL (ref 78.0–100.0)
Monocytes Absolute: 0.5 10*3/uL (ref 0.1–1.0)
Monocytes Relative: 7 % (ref 3–12)
Neutro Abs: 3.1 10*3/uL (ref 1.7–7.7)
Neutrophils Relative %: 47 % (ref 43–77)
Platelets: 105 10*3/uL — ABNORMAL LOW (ref 150–400)
RBC: 4.53 MIL/uL (ref 4.22–5.81)
RDW: 13.5 % (ref 11.5–15.5)
WBC: 6.7 10*3/uL (ref 4.0–10.5)

## 2012-07-02 LAB — SURGICAL PCR SCREEN
MRSA, PCR: NEGATIVE
Staphylococcus aureus: NEGATIVE

## 2012-07-02 NOTE — Patient Instructions (Addendum)
[name]  07/02/2012   Your procedure is scheduled on:  07/09/12  Report to [facility] at  840  AM.  Call this number if you have problems the morning of surgery: [phone]   Remember:   Do not eat food:After Midnight.  May have clear liquids:until Midnight .    Take these medicines the morning of surgery with A SIP OF WATER:  Atenolol, lisinopril   Do not wear jewelry, make-up or nail polish.  Do not wear lotions, powders, or perfumes. You may wear deodorant.  Do not shave 48 hours prior to surgery. Men may shave face and neck.  Do not bring valuables to the hospital.  Contacts, dentures or bridgework may not be worn into surgery.  Leave suitcase in the car. After surgery it may be brought to your room.  For patients admitted to the hospital, checkout time is 11:00 AM the day of discharge.   Patients discharged the day of surgery will not be allowed to drive home.  Name and phone number of your driver: family  Special Instructions: CHG Shower Use Special Wash: 1/2 bottle night before surgery and 1/2 bottle morning of surgery.   Please read over the following fact sheets that you were given: Pain Booklet, MRSA Information, Surgical Site Infection Prevention, Anesthesia Post-op Instructions and Care and Recovery After Surgery PATIENT INSTRUCTIONS POST-ANESTHESIA  IMMEDIATELY FOLLOWING SURGERY:  Do not drive or operate machinery for the first twenty four hours after surgery.  Do not make any important decisions for twenty four hours after surgery or while taking narcotic pain medications or sedatives.  If you develop intractable nausea and vomiting or a severe headache please notify your doctor immediately.  FOLLOW-UP:  Please make an appointment with your surgeon as instructed. You do not need to follow up with anesthesia unless specifically instructed to do so.  WOUND CARE INSTRUCTIONS (if applicable):  Keep a dry clean dressing on the anesthesia/puncture wound site if  there is drainage.  Once the wound has quit draining you may leave it open to air.  Generally you should leave the bandage intact for twenty four hours unless there is drainage.  If the epidural site drains for more than 36-48 hours please call the anesthesia department.  QUESTIONS?:  Please feel free to call your physician or the hospital operator if you have any questions, and they will be happy to assist you.      Cyst Removal Your caregiver has removed a cyst. A cyst is a sac containing a semi-solid material. Cysts may occur any place on your body. They may remain small for years or gradually get larger. A sebaceous cyst is an enlarged (dilated) sweat gland filled with old sweat (sebum). Unattended, these may become large (the size of a softball) over several years time. These are often removed for improved appearance (cosmetic) reasons or before they become infected to form an abscess. An abscess is an infected cyst. HOME CARE INSTRUCTIONS   Keep your bandage clean and dry. You may change your bandage after 24 hours. If your bandage sticks, use warm water to gently loosen it. Pat the area dry with a clean towel before putting on another bandage.   If possible, keep the area where the cyst was removed raised to relieve soreness, swelling, and promote healing.   If you have stitches, keep them clean and dry.   You may clean your stitches gently with a cotton swab dipped in warm soapy  water.   Do not soak the area where the cyst was removed or go swimming. You may shower.   Do not overuse the area where your cyst was removed.   Return in 7 days or as directed to have your stitches removed.   Take medicines as instructed by your caregiver.  SEEK IMMEDIATE MEDICAL CARE IF:   An oral temperature above 102 F (38.9 C) develops, not controlled by medication.   Blood continues to soak through the bandage.   You have increasing pain in the area where your cyst was removed.   You have  redness, swelling, pus, a bad smell, soreness (inflammation), or red streaks coming away from the stitches. These are signs of infection.  MAKE SURE YOU:   Understand these instructions.   Will watch your condition.   Will get help right away if you are not doing well or get worse.  Document Released: 11/18/2000 Document Revised: 11/10/2011 Document Reviewed: 03/13/2008 [facility] Patient Information 2012 [place], [place].

## 2012-07-09 ENCOUNTER — Encounter (HOSPITAL_COMMUNITY): Payer: Self-pay | Admitting: Anesthesiology

## 2012-07-09 ENCOUNTER — Encounter (HOSPITAL_COMMUNITY)
Admission: RE | Disposition: A | Discharge status: Home / Self Care | Payer: Self-pay | Source: Ambulatory Visit | Attending: General Surgery

## 2012-07-09 ENCOUNTER — Ambulatory Visit (HOSPITAL_COMMUNITY): Payer: Medicare Other | Admitting: Anesthesiology

## 2012-07-09 ENCOUNTER — Encounter (HOSPITAL_COMMUNITY): Payer: Self-pay | Admitting: *Deleted

## 2012-07-09 ENCOUNTER — Ambulatory Visit (HOSPITAL_COMMUNITY)
Admission: RE | Admit: 2012-07-09 | Discharge: 2012-07-09 | Disposition: A | Discharge status: Home / Self Care | Payer: Medicare Other | Source: Ambulatory Visit | Attending: General Surgery | Admitting: General Surgery

## 2012-07-09 DIAGNOSIS — Z01812 Encounter for preprocedural laboratory examination: Secondary | ICD-10-CM | POA: Insufficient documentation

## 2012-07-09 DIAGNOSIS — L909 Atrophic disorder of skin, unspecified: Secondary | ICD-10-CM | POA: Insufficient documentation

## 2012-07-09 DIAGNOSIS — L723 Sebaceous cyst: Secondary | ICD-10-CM | POA: Insufficient documentation

## 2012-07-09 DIAGNOSIS — Z01810 Encounter for preprocedural cardiovascular examination: Secondary | ICD-10-CM | POA: Insufficient documentation

## 2012-07-09 HISTORY — PX: EAR CYST EXCISION: SHX22

## 2012-07-09 SURGERY — CYST REMOVAL
Anesthesia: Monitor Anesthesia Care | Site: Scalp | Wound class: Clean

## 2012-07-09 MED ORDER — GLYCOPYRROLATE 0.2 MG/ML IJ SOLN
INTRAMUSCULAR | Status: AC
  Filled 2012-07-09: qty 1

## 2012-07-09 MED ORDER — MIDAZOLAM HCL 2 MG/2ML IJ SOLN
INTRAMUSCULAR | Status: AC
  Filled 2012-07-09: qty 2

## 2012-07-09 MED ORDER — GLYCOPYRROLATE 0.2 MG/ML IJ SOLN
0.2000 mg | Freq: Once | INTRAMUSCULAR | Status: AC
  Administered 2012-07-09: 0.2 mg via INTRAVENOUS

## 2012-07-09 MED ORDER — LIDOCAINE HCL (CARDIAC) 10 MG/ML IV SOLN
INTRAVENOUS | Status: DC | PRN
  Administered 2012-07-09: 10 mg via INTRAVENOUS
  Administered 2012-07-09: 15 mg via INTRAVENOUS

## 2012-07-09 MED ORDER — FENTANYL CITRATE 0.05 MG/ML IJ SOLN
INTRAMUSCULAR | Status: DC | PRN
  Administered 2012-07-09 (×4): 25 ug via INTRAVENOUS

## 2012-07-09 MED ORDER — LACTATED RINGERS IV SOLN
INTRAVENOUS | Status: DC
  Administered 2012-07-09: 1000 mL via INTRAVENOUS

## 2012-07-09 MED ORDER — LIDOCAINE HCL (PF) 1 % IJ SOLN
INTRAMUSCULAR | Status: AC
  Filled 2012-07-09: qty 5

## 2012-07-09 MED ORDER — CEFAZOLIN SODIUM-DEXTROSE 2-3 GM-% IV SOLR
2.0000 g | INTRAVENOUS | Status: AC
  Administered 2012-07-09: 2 g via INTRAVENOUS

## 2012-07-09 MED ORDER — MIDAZOLAM HCL 2 MG/2ML IJ SOLN
1.0000 mg | INTRAMUSCULAR | Status: DC | PRN
  Administered 2012-07-09: 2 mg via INTRAVENOUS

## 2012-07-09 MED ORDER — PROPOFOL 10 MG/ML IV EMUL
INTRAVENOUS | Status: AC
  Filled 2012-07-09: qty 20

## 2012-07-09 MED ORDER — LIDOCAINE-EPINEPHRINE 1 %-1:100000 IJ SOLN
INTRAMUSCULAR | Status: DC | PRN
  Administered 2012-07-09: 1 mL

## 2012-07-09 MED ORDER — SODIUM CHLORIDE 0.9 % IR SOLN
Status: DC | PRN
  Administered 2012-07-09: 1000 mL

## 2012-07-09 MED ORDER — ONDANSETRON HCL 4 MG/2ML IJ SOLN
INTRAMUSCULAR | Status: AC
  Administered 2012-07-09: 4 mg via INTRAVENOUS
  Filled 2012-07-09: qty 2

## 2012-07-09 MED ORDER — LIDOCAINE-EPINEPHRINE (PF) 1 %-1:200000 IJ SOLN
INTRAMUSCULAR | Status: AC
  Filled 2012-07-09: qty 10

## 2012-07-09 MED ORDER — CEFAZOLIN SODIUM-DEXTROSE 2-3 GM-% IV SOLR
INTRAVENOUS | Status: AC
  Filled 2012-07-09: qty 50

## 2012-07-09 MED ORDER — LIDOCAINE HCL (PF) 1 % IJ SOLN
INTRAMUSCULAR | Status: AC
  Filled 2012-07-09: qty 30

## 2012-07-09 MED ORDER — ONDANSETRON HCL 4 MG/2ML IJ SOLN
4.0000 mg | Freq: Once | INTRAMUSCULAR | Status: AC
  Administered 2012-07-09: 4 mg via INTRAVENOUS

## 2012-07-09 MED ORDER — BACITRACIN ZINC 500 UNIT/GM EX OINT
TOPICAL_OINTMENT | CUTANEOUS | Status: AC
  Filled 2012-07-09: qty 0.9

## 2012-07-09 MED ORDER — BACITRACIN ZINC 500 UNIT/GM EX OINT
TOPICAL_OINTMENT | CUTANEOUS | Status: DC | PRN
  Administered 2012-07-09: 1 via TOPICAL

## 2012-07-09 MED ORDER — FENTANYL CITRATE 0.05 MG/ML IJ SOLN
INTRAMUSCULAR | Status: AC
  Filled 2012-07-09: qty 2

## 2012-07-09 MED ORDER — HYDROCODONE-ACETAMINOPHEN 5-325 MG PO TABS: 1.0000 | ORAL_TABLET | ORAL | Status: DC | PRN

## 2012-07-09 MED ORDER — PROPOFOL 10 MG/ML IV EMUL
INTRAVENOUS | Status: DC | PRN
  Administered 2012-07-09: 50 ug/kg/min via INTRAVENOUS

## 2012-07-09 MED ORDER — POVIDONE-IODINE 10 % EX OINT
TOPICAL_OINTMENT | CUTANEOUS | Status: AC
  Filled 2012-07-09: qty 2

## 2012-07-09 SURGICAL SUPPLY — 30 items
BAG HAMPER (MISCELLANEOUS) ×4 IMPLANT
CLOTH BEACON ORANGE TIMEOUT ST (SAFETY) ×4 IMPLANT
COVER LIGHT HANDLE STERIS (MISCELLANEOUS) ×8 IMPLANT
DECANTER SPIKE VIAL GLASS SM (MISCELLANEOUS) ×4 IMPLANT
DERMABOND ADVANCED (GAUZE/BANDAGES/DRESSINGS)
DERMABOND ADVANCED .7 DNX12 (GAUZE/BANDAGES/DRESSINGS) IMPLANT
DRAPE EENT ADH APERT 15X15 STR (DRAPES) ×4 IMPLANT
DURAPREP 26ML APPLICATOR (WOUND CARE) ×4 IMPLANT
ELECT NEEDLE TIP 2.8 STRL (NEEDLE) ×4 IMPLANT
ELECT REM PT RETURN 9FT ADLT (ELECTROSURGICAL) ×4
ELECTRODE REM PT RTRN 9FT ADLT (ELECTROSURGICAL) ×3 IMPLANT
FORMALIN 10 PREFIL 120ML (MISCELLANEOUS) ×4 IMPLANT
GLOVE BIO SURGEON STRL SZ7.5 (GLOVE) ×4 IMPLANT
GLOVE BIOGEL PI IND STRL 7.0 (GLOVE) ×6 IMPLANT
GLOVE BIOGEL PI INDICATOR 7.0 (GLOVE) ×2
GLOVE ECLIPSE 6.5 STRL STRAW (GLOVE) ×4 IMPLANT
GOWN STRL REIN XL XLG (GOWN DISPOSABLE) ×12 IMPLANT
KIT ROOM TURNOVER APOR (KITS) ×4 IMPLANT
MANIFOLD NEPTUNE II (INSTRUMENTS) ×4 IMPLANT
NEEDLE HYPO 25X1 1.5 SAFETY (NEEDLE) ×4 IMPLANT
NS IRRIG 1000ML POUR BTL (IV SOLUTION) ×4 IMPLANT
PACK MINOR (CUSTOM PROCEDURE TRAY) IMPLANT
PAD ARMBOARD 7.5X6 YLW CONV (MISCELLANEOUS) ×4 IMPLANT
SET BASIN LINEN APH (SET/KITS/TRAYS/PACK) ×4 IMPLANT
SUT ETHILON 3 0 FSL (SUTURE) IMPLANT
SUT PROLENE 4 0 PS 2 18 (SUTURE) ×4 IMPLANT
SUT VIC AB 3-0 SH 27 (SUTURE)
SUT VIC AB 3-0 SH 27X BRD (SUTURE) IMPLANT
SUT VIC AB 4-0 PS2 27 (SUTURE) IMPLANT
SYR CONTROL 10ML LL (SYRINGE) ×4 IMPLANT

## 2012-07-09 NOTE — Interval H&P Note (Signed)
History and Physical Interval Note:  07/09/2012 9:11 AM  Peter [name]  has presented today for surgery, with the diagnosis of Sebaceous Cyst Scalp and Back  The various methods of treatment have been discussed with the patient and family. After consideration of risks, benefits and other options for treatment, the patient has consented to  Procedure(s) (LRB): CYST REMOVAL (N/A) CYST REMOVAL TRUNK (N/A) as a surgical intervention .  The patient's history has been reviewed, patient examined, no change in status, stable for surgery.  I have reviewed the patient's chart and labs.  Questions were answered to the patient's satisfaction.     Franky Macho A

## 2012-07-09 NOTE — Interval H&P Note (Signed)
History and Physical Interval Note:  07/09/2012 10:36 AM  Peter [name]  has presented today for surgery, with the diagnosis of Sebaceous Cyst Scalp and Back  The various methods of treatment have been discussed with the patient and family. After consideration of risks, benefits and other options for treatment, the patient has consented to  Procedure(s) (LRB): CYST REMOVAL (N/[name]) CYST REMOVAL TRUNK (N/[name]) as [name] surgical intervention .  The patient's history has been reviewed, patient examined, no change in status, stable for surgery.  I have reviewed the patient's chart and labs.  Questions were answered to the patient's satisfaction.     Peter [name]  Addendum:  Back cyst has resolved.  Patient would like skin tag removed from left ear.

## 2012-07-09 NOTE — Interval H&P Note (Signed)
History and Physical Interval Note:  07/09/2012 10:35 AM  Peter [name]  has presented today for surgery, with the diagnosis of Sebaceous Cyst Scalp and Back  The various methods of treatment have been discussed with the patient and family. After consideration of risks, benefits and other options for treatment, the patient has consented to  Procedure(s) (LRB): CYST REMOVAL (N/A) CYST REMOVAL TRUNK (N/A) as a surgical intervention .  The patient's history has been reviewed, patient examined, no change in status, stable for surgery.  I have reviewed the patient's chart and labs.  Questions were answered to the patient's satisfaction.     Lisa-Marie Rueger A  Addendum:  Back cyst has resolved.  Patient requests removal of skin tag, left ear.

## 2012-07-09 NOTE — Anesthesia Preprocedure Evaluation (Addendum)
Anesthesia Evaluation  Patient identified by MRN, date of birth, ID band Patient awake    Reviewed: Allergy & Precautions, H&P , NPO status , Patient's Chart, lab work & pertinent test results, reviewed documented beta blocker date and time   Airway Mallampati: I TM Distance: >3 FB Neck ROM: Full    Dental No notable dental hx.    Pulmonary neg pulmonary ROS,    Pulmonary exam normal       Cardiovascular hypertension, Pt. on medications and Pt. on home beta blockers + Past MI Rhythm:Regular Rate:Normal     Neuro/Psych negative neurological ROS  negative psych ROS   GI/Hepatic negative GI ROS, Neg liver ROS,   Endo/Other    Renal/GU negative Renal ROS     Musculoskeletal negative musculoskeletal ROS (+)   Abdominal Normal abdominal exam  (+)   Peds  Hematology negative hematology ROS (+)   Anesthesia Other Findings   Reproductive/Obstetrics                          Anesthesia Physical Anesthesia Plan  ASA: III  Anesthesia Plan: MAC   Post-op Pain Management:    Induction: Intravenous  Airway Management Planned: Nasal Cannula  Additional Equipment:   Intra-op Plan:   Post-operative Plan:   Informed Consent: I have reviewed the patients History and Physical, chart, labs and discussed the procedure including the risks, benefits and alternatives for the proposed anesthesia with the patient or authorized representative who has indicated his/her understanding and acceptance.     Plan Discussed with: CRNA  Anesthesia Plan Comments:         Anesthesia Quick Evaluation

## 2012-07-09 NOTE — Op Note (Signed)
Patient:  Peter [name]  DOB:  [birth year]  MRN:  [id number]   Preop Diagnosis:  Sebaceous cyst, scalp  Skin tag, left ear  Postop Diagnosis:  Same  Procedure:  Excision of 1.5 cm sebaceous cyst, scalp  Shave removal of skin tag, left ear  Surgeon:  Franky Macho, M.D.  Anes:  MAC  Indications:  Patient is a 71 year old white male who presents with an enlarging sebaceous cyst on the scalp. He also has a skin tag on her left ear which would like removed. The risks and benefits of the procedures were fully explained to the patient, gave informed consent.  Procedure note:  Patient is placed the supine position. After anesthesia was given, the left superior aspect of the scalp as well as the left ear were prepped and draped using usual sterile technique with DuraPrep. Surgical site confirmation was performed. 1% Xylocaine with epinephrine was used for local anesthesia.  An incision was made along the left top area of the scalp. A sebaceous cyst was found and was removed in total without difficulty. A bleeding was controlled using Bovie electrocautery. The skin was closed using 4-0 Prolene interrupted sutures. Neosporin ointment was applied.  Next, a skin tag along the inner tragus of the left ear was removed using shave cautery without difficulty. Neosporin ointment was applied.  All tape and needle counts were correct at the end of the procedures. The patient was transferred to PACU in stable condition.  Complications:  None  EBL:  Minimal  Specimen:  Sebaceous cyst, scalp and skin tag, left ear

## 2012-07-09 NOTE — Transfer of Care (Signed)
Immediate Anesthesia Transfer of Care Note  Patient: Peter [name]  Procedure(s) Performed: Procedure(s) (LRB): CYST REMOVAL (N/A) EXCISION OF SKIN TAG (Left)  Patient Location: Shortstay  Anesthesia Type: MAC  Level of Consciousness: awake  Airway & Oxygen Therapy: Patient Spontanous Breathing   Post-op Assessment: Report given to PACU RN, Post -op Vital signs reviewed and stable and Patient moving all extremities  Post vital signs: Reviewed and stable  Complications: No apparent anesthesia complications

## 2012-07-09 NOTE — Anesthesia Procedure Notes (Signed)
Procedure Name: MAC Date/Time: 07/09/2012 10:50 AM Performed by: Franco Nones Pre-anesthesia Checklist: Patient identified, Emergency Drugs available, Suction available, Timeout performed and Patient being monitored Patient Re-evaluated:Patient Re-evaluated prior to inductionOxygen Delivery Method: Nasal Cannula

## 2012-07-09 NOTE — Anesthesia Postprocedure Evaluation (Signed)
  Anesthesia Post-op Note  Patient: Peter [name]  Procedure(s) Performed: Procedure(s) (LRB): CYST REMOVAL (N/A) EXCISION OF SKIN TAG (Left)  Patient Location:  Short Stay  Anesthesia Type: MAC  Level of Consciousness: awake  Airway and Oxygen Therapy: Patient Spontanous Breathing  Post-op Pain: none  Post-op Assessment: Post-op Vital signs reviewed, Patient's Cardiovascular Status Stable, Respiratory Function Stable, Patent Airway, No signs of Nausea or vomiting and Pain level controlled  Post-op Vital Signs: Reviewed and stable  Complications: No apparent anesthesia complications

## 2012-07-10 ENCOUNTER — Encounter (HOSPITAL_COMMUNITY): Payer: Self-pay | Admitting: General Surgery

## 2013-03-13 ENCOUNTER — Ambulatory Visit (INDEPENDENT_AMBULATORY_CARE_PROVIDER_SITE_OTHER): Payer: Medicare Other | Admitting: Family Medicine

## 2013-03-13 ENCOUNTER — Encounter: Payer: Self-pay | Admitting: Family Medicine

## 2013-03-13 VITALS — BP 130/72 | Temp 99.0°F | Ht 72.0 in | Wt 195.0 lb

## 2013-03-13 DIAGNOSIS — E1122 Type 2 diabetes mellitus with diabetic chronic kidney disease: Secondary | ICD-10-CM | POA: Insufficient documentation

## 2013-03-13 DIAGNOSIS — E785 Hyperlipidemia, unspecified: Secondary | ICD-10-CM

## 2013-03-13 DIAGNOSIS — E119 Type 2 diabetes mellitus without complications: Secondary | ICD-10-CM | POA: Insufficient documentation

## 2013-03-13 DIAGNOSIS — R109 Unspecified abdominal pain: Secondary | ICD-10-CM | POA: Insufficient documentation

## 2013-03-13 DIAGNOSIS — E1142 Type 2 diabetes mellitus with diabetic polyneuropathy: Secondary | ICD-10-CM | POA: Insufficient documentation

## 2013-03-13 MED ORDER — OMEPRAZOLE 20 MG PO CPDR: 20.0000 mg | DELAYED_RELEASE_CAPSULE | Freq: Every day | ORAL | Status: DC

## 2013-03-13 NOTE — Progress Notes (Signed)
  Subjective:    Patient ID: Peter [name], male    DOB: 11/02/1941, 72 y.o.   MRN: [id number]  Gastrophageal Reflux He complains of abdominal pain. This is a new problem. The current episode started more than 1 month ago. The problem occurs occasionally. He has tried a diet change and an herbal remedy for the symptoms.  patient thinks that he has GERD. He states he stopped drinking beer needs relating intermittent abdominal pain sometimes are coming up the epigastric area sometimes on the sides at times he's had difficult T. Having a bowel movement he denies any blood in the stools. He denies vomiting the pain is woke him up a couple times. Symptoms been going on for at least a month he has not tried any medicines for it. No fevers with it no weight loss no wheezing night sweats denies diarrhea. No other symptoms. He does try to eat healthy. He has a history of type 2 diabetes and hyperlipidemia. He relates compliance with all of his medicines. He is a former smoker. End up to a few weeks ago was having a few beers a day no longer doing this.  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  see above. Family history social history all reviewed.    [id number] Objective:   Physical Exam  Vital signs stable. Lungs are clear heart is regular pulse normal skin warm dry abdomen soft no guarding rebound or tenderness. Extremities no edema he does have some subjective discomfort in the abdomen      Assessment & Plan:  History of abdominal aneurysm we will do an ultrasound in the summer Diabetes lab work ordered. Encourage healthy diet. Hyperlipidemia recheck lab work. May need medication. Abdominal pain-this is most concerning and the reason that brought him in I recommend that we go ahead and refer him to gastroenterology for further evaluation. We have  ordered lab work I believe this patient is need a colonoscopy quite possibly will need a CAT scan as well. He does have some risk factors and symptoms that could go along with colon cancer or even pancreatic cancer.

## 2013-03-13 NOTE — Patient Instructions (Signed)
Omeprazole 20 daily , best price may be OTC from [facility]  I will get referral to GI doc

## 2013-03-14 LAB — BASIC METABOLIC PANEL
BUN: 15 mg/dL (ref 6–23)
CO2: 26 mEq/L (ref 19–32)
Calcium: 9.1 mg/dL (ref 8.4–10.5)
Chloride: 101 mEq/L (ref 96–112)
Creat: 1.14 mg/dL (ref 0.50–1.35)
Glucose, Bld: 140 mg/dL — ABNORMAL HIGH (ref 70–99)
Potassium: 4.6 mEq/L (ref 3.5–5.3)
Sodium: 137 mEq/L (ref 135–145)

## 2013-03-14 LAB — HEMOGLOBIN A1C
Hgb A1c MFr Bld: 7.3 % — ABNORMAL HIGH (ref <5.7)
Mean Plasma Glucose: 163 mg/dL — ABNORMAL HIGH (ref <117)

## 2013-03-14 LAB — CBC WITH DIFFERENTIAL/PLATELET
Basophils Absolute: 0 10*3/uL (ref 0.0–0.1)
Basophils Relative: 0 % (ref 0–1)
Eosinophils Absolute: 0.3 10*3/uL (ref 0.0–0.7)
Eosinophils Relative: 4 % (ref 0–5)
HCT: 40.6 % (ref 39.0–52.0)
Hemoglobin: 14.5 g/dL (ref 13.0–17.0)
Lymphocytes Relative: 38 % (ref 12–46)
Lymphs Abs: 3.3 10*3/uL (ref 0.7–4.0)
MCH: 33.6 pg (ref 26.0–34.0)
MCHC: 35.7 g/dL (ref 30.0–36.0)
MCV: 94 fL (ref 78.0–100.0)
Monocytes Absolute: 0.7 10*3/uL (ref 0.1–1.0)
Monocytes Relative: 9 % (ref 3–12)
Neutro Abs: 4.3 10*3/uL (ref 1.7–7.7)
Neutrophils Relative %: 49 % (ref 43–77)
Platelets: 174 10*3/uL (ref 150–400)
RBC: 4.32 MIL/uL (ref 4.22–5.81)
RDW: 12.8 % (ref 11.5–15.5)
WBC: 8.6 10*3/uL (ref 4.0–10.5)

## 2013-03-14 LAB — LIPID PANEL
Cholesterol: 187 mg/dL (ref 0–200)
HDL: 29 mg/dL — ABNORMAL LOW (ref >39)
LDL Cholesterol: 142 mg/dL — ABNORMAL HIGH (ref 0–99)
Total CHOL/HDL Ratio: 6.4 Ratio
Triglycerides: 79 mg/dL (ref <150)
VLDL: 16 mg/dL (ref 0–40)

## 2013-03-14 LAB — HEPATIC FUNCTION PANEL
ALT: 24 U/L (ref 0–53)
AST: 27 U/L (ref 0–37)
Albumin: 3.8 g/dL (ref 3.5–5.2)
Alkaline Phosphatase: 61 U/L (ref 39–117)
Bilirubin, Direct: 0.3 mg/dL (ref 0.0–0.3)
Indirect Bilirubin: 0.8 mg/dL (ref 0.0–0.9)
Total Bilirubin: 1.1 mg/dL (ref 0.3–1.2)
Total Protein: 6.9 g/dL (ref 6.0–8.3)

## 2013-03-14 LAB — LIPASE: Lipase: 25 U/L (ref 0–75)

## 2013-03-28 ENCOUNTER — Ambulatory Visit: Payer: Medicare Other | Admitting: Gastroenterology

## 2013-03-29 ENCOUNTER — Ambulatory Visit (INDEPENDENT_AMBULATORY_CARE_PROVIDER_SITE_OTHER): Payer: Medicare Other | Admitting: Family Medicine

## 2013-03-29 VITALS — BP 160/92 | Temp 98.6°F | Wt 196.4 lb

## 2013-03-29 DIAGNOSIS — J019 Acute sinusitis, unspecified: Secondary | ICD-10-CM

## 2013-03-29 DIAGNOSIS — W57XXXA Bitten or stung by nonvenomous insect and other nonvenomous arthropods, initial encounter: Secondary | ICD-10-CM

## 2013-03-29 DIAGNOSIS — T148 Other injury of unspecified body region: Secondary | ICD-10-CM

## 2013-03-29 MED ORDER — AMOXICILLIN 500 MG PO TABS: 500.0000 mg | ORAL_TABLET | Freq: Two times a day (BID) | ORAL | Status: DC

## 2013-03-29 MED ORDER — TRIAMCINOLONE ACETONIDE 0.1 % EX CREA: TOPICAL_CREAM | Freq: Two times a day (BID) | CUTANEOUS | Status: DC

## 2013-03-29 NOTE — Patient Instructions (Addendum)
Deer Tick Bite Deer ticks are brown arachnids (spider family) that vary in size from as small as the head of a pin to 1/4 inch (1/2 cm) diameter. They thrive in wooded areas. Deer are the preferred host of adult deer ticks. Small rodents are the host of young ticks (nymphs). When a person walks in a field or wooded area, young and adult ticks in the surrounding grass and vegetation can attach themselves to the skin. They can suck blood for hours to days if unnoticed. Ticks are found all over the [place] Some ticks carry a specific bacteria (Borrelia burgdorferi) that causes an infection called Lyme disease. The bacteria is typically passed into a person during the blood sucking process. This happens after the tick has been attached for at least a number of hours. While ticks can be found all over the [place] those carrying the bacteria that causes Lyme disease are most common in [place] and the [place]. Only a small proportion of ticks in these areas carry the Lyme disease bacteria and cause human infections. Ticks usually attach to warm spots on the body, such as the:  Head.  Back.  Neck.  Armpits.  Groin. SYMPTOMS  Most of the time, a deer tick bite will not be felt. You may or may not see the attached tick. You may notice mild irritation or redness around the bite site. If the deer tick passes the Lyme disease bacteria to a person, a round, red rash may be noticed 2 to 3 days after the bite. The rash may be clear in the middle, like a bull's-eye or target. If not treated, other symptoms may develop several days to weeks after the onset of the rash. These symptoms may include:  New rash lesions.  Fatigue and weakness.  General ill feeling and achiness.  Chills.  Headache and neck pain.  Swollen lymph glands.  Sore muscles and joints. 5 to 15% of untreated people with Lyme disease may develop more severe illnesses after several weeks to months. This may include inflammation of the  brain lining (meningitis), nerve palsies, an abnormal heartbeat, or severe muscle and joint pain and inflammation (myositis or arthritis). DIAGNOSIS   Physical exam and medical history.  Viewing the tick if it was saved for confirmation.  Blood tests (to check or confirm the presence of Lyme disease). TREATMENT  Most ticks do not carry disease. If found, an attached tick should be removed using tweezers. Tweezers should be placed under the body of the tick so it is removed by its attachment parts (pincers). If there are signs or symptoms of being sick, or Lyme disease is confirmed, medicines (antibiotics) that kill germs are usually prescribed. In more severe cases, antibiotics may be given through an intravenous (IV) access. HOME CARE INSTRUCTIONS   Always remove ticks with tweezers. Do not use petroleum jelly or other methods to kill or remove the tick. Slide the tweezers under the body and pull out as much as you can. If you are not sure what it is, save it in a jar and show your caregiver.  Once you remove the tick, the skin will heal on its own. Wash your hands and the affected area with water and soap. You may place a bandage on the affected area.  Take medicine as directed. You may be advised to take a full course of antibiotics.  Follow up with your caregiver as recommended. FINDING OUT THE RESULTS OF YOUR TEST Not all test results are available   during your visit. If your test results are not back during the visit, make an appointment with your caregiver to find out the results. Do not assume everything is normal if you have not heard from your caregiver or the medical facility. It is important for you to follow up on all of your test results. PROGNOSIS  If Lyme disease is confirmed, early treatment with antibiotics is very effective. Following preventive guidelines is important since it is possible to get the disease more than once. PREVENTION   Wear long sleeves and long pants in  wooded or grassy areas. Tuck your pants into your socks.  Use an insect repellent while hiking.  Check yourself, your children, and your pets regularly for ticks after playing outside.  Clear piles of leaves or brush from your yard. Ticks might live there. SEEK MEDICAL CARE IF:   You or your child has an oral temperature above 102 F (38.9 C).  You develop a severe headache following the bite.  You feel generally ill.  You notice a rash.  You are having trouble removing the tick.  The bite area has red skin or yellow drainage. SEEK IMMEDIATE MEDICAL CARE IF:   Your face is weak and droopy or you have other neurological symptoms.  You have severe joint pain or weakness. MAKE SURE YOU:   Understand these instructions.  Will watch your condition.  Will get help right away if you are not doing well or get worse. FOR MORE INFORMATION Centers for Disease Control and Prevention: FootballExhibition.com.br American Academy of [employer]icians: www.https://powers.com/ Document Released: 02/15/2010 Document Revised: 02/13/2012 Document Reviewed: 02/15/2010 [facility] Patient Information 2013 [place], [place].   CALL IN July FOR LABWORK/ULTRASOUND/ AND OFFICE VISIT

## 2013-03-29 NOTE — Progress Notes (Signed)
  Subjective:    Patient ID: Peter [name], male    DOB: 12/31/1940, 72 y.o.   MRN: [id number]  Sore Throat  This is a new problem. The current episode started in the past 7 days. The problem has been gradually worsening. Neither side of throat is experiencing more pain than the other. There has been no fever. The fever has been present for less than 1 day. The pain is at a severity of 4/10. The pain is mild. Associated symptoms comments: No sneezing or itchy eyes No sinus pain.   Patient denies any fever. He does state she has a tick behind the left knee that attached yesterday. He has a hard time getting it off. He also relates has a rash on his right buttock that itches some. Past medical history family history reviewed   Review of Systemsreview of systems generally negative no fever please see above     Objective:   Physical Exam Eardrums are normal throat is normal neck is supple no masses lungs are clear no crackles heart is regular abdomen is soft there is an excoriated rash on the right buttock is very small tick was removed from behind the left knee there is no redness is removed without difficulty plus also patient does have mild head congestion sinus pressure       Assessment & Plan:  Acute sinusitis-amoxicillin 3 times a day 10 days call if progressive troubles or worse Atopic dermatitis-Kenalog cream twice a day when necessary Tick removal talked about warning signs no need for antibiotics for this currently.  He was advised to call back in July for his lab work plus also to set up ultrasound of the aortic aneurysm plus also followup for a wellness exam at that time.

## 2013-04-02 ENCOUNTER — Ambulatory Visit (INDEPENDENT_AMBULATORY_CARE_PROVIDER_SITE_OTHER): Payer: Medicare Other | Admitting: Gastroenterology

## 2013-04-02 ENCOUNTER — Encounter: Payer: Self-pay | Admitting: Gastroenterology

## 2013-04-02 VITALS — BP 155/88 | HR 68 | Temp 98.3°F | Ht 72.0 in | Wt 195.8 lb

## 2013-04-02 DIAGNOSIS — F1011 Alcohol abuse, in remission: Secondary | ICD-10-CM | POA: Insufficient documentation

## 2013-04-02 DIAGNOSIS — R198 Other specified symptoms and signs involving the digestive system and abdomen: Secondary | ICD-10-CM

## 2013-04-02 DIAGNOSIS — R194 Change in bowel habit: Secondary | ICD-10-CM | POA: Insufficient documentation

## 2013-04-02 DIAGNOSIS — R109 Unspecified abdominal pain: Secondary | ICD-10-CM

## 2013-04-02 NOTE — Progress Notes (Signed)
Forwarded to PCP.

## 2013-04-02 NOTE — Assessment & Plan Note (Signed)
72 y/o male with recent bowel habit change. Reports new onset constipation when he stopped consuming etoh 01/2013. Previously had loose stool. Denies brbpr, melena. H/O intermittent etoh abuse over past five years but with periods of no etoh use for 6 months at a time. Previously with some vague abdominal discomfort but states this has resolved. GI symptoms possibly related to etoh gastritis/colopathy but cannot exclude pancreatitic etiology. Denies GERD symptoms. Did not take the PPI recommended by Dr. Gerda Diss a few weeks ago. Tried some "home remedies." Discussed the recommendations of pursing colonoscopy for bowel changes which could be secondary to d/c etoh use but need to exclude malignancy. Discussed risks at length with patient including pros/cons of procedure. He would like to think about it before agreeing to scheduling a colonoscopy. He has also requesting to know expense of procedure and our office staff will help him with that.   He will call us and let us know what he decides.   If he has recurrent abdominal pain, would consider CT A/P at that time. He will continue to have u/s surveillance of his AAA with Dr. Gerda Diss as planned.

## 2013-04-02 NOTE — Progress Notes (Signed)
Primary Care Physician:  Lilyan Punt, MD  Primary Gastroenterologist:  Roetta Sessions, MD   Chief Complaint  Patient presents with  . Abdominal Pain    HPI:  Peter [name] is a 72 y.o. male here for further evaluation of abdominal pain. Symptoms began about 5 or 6 weeks ago. He saw Dr. Lilyan Punt of 03/13/2013. He reports that he was having a "acid problem". Denies heartburn, dysphagia, odynophagia, vomiting. States the symptoms began about 3 months ago when he quit drinking beer. 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 In 2013 he quit drinking for about 6 months. He quit drinking because of the expense. Denies any history of DTs. In the past he would have loose stools while drinking beer. When he quit he started replacing the beer with sweets. This "got my system all out of balance". Started having problems with constipation. Took Aloe Vera Juice which helped. H/O IBS as teenager. States he is out now. His bowel movements are regular. He denies melena or rectal bleeding. Diabetes is diet controlled. Denies weight loss. No prior colonoscopy.  Reports that he is kind of scared of having a colonoscopy because his father died after having what sounds like a Gastrografin enema for impaction. Father also had incurable brain cancer at the time and on radiation therapy.  He spent a great deal of time today voicing his opinion regarding the state of the health care system.   Current Outpatient Prescriptions  Medication Sig Dispense Refill  . amoxicillin (AMOXIL) 500 MG capsule Take 500 mg by mouth 3 (three) times daily.      Marland Kitchen atenolol (TENORMIN) 100 MG tablet Take 100 mg by mouth daily.      Marland Kitchen lisinopril (PRINIVIL,ZESTRIL) 10 MG tablet Take 10 mg by mouth daily.       No current facility-administered medications for this visit.    Allergies as of 04/02/2013 - Review Complete 04/02/2013  Allergen Reaction Noted  . Dye fdc red (red  dye)  07/02/2012    Past Medical History  Diagnosis Date  . Myocardial infarction 1997    stents x2 (2003/2007)  . Diabetes mellitus     diet controlled  . AAA (abdominal aortic aneurysm)     3.6 X 3.4cm on u/s 06/2012    Past Surgical History  Procedure Laterality Date  . Cystectomy  2005    top of head-Jenkins  . Coronary angioplasty  2003/2007    2 stents  . 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 Ear cyst excision  07/09/2012    Procedure: CYST REMOVAL;  Surgeon: Dalia Heading, MD;  Location: [facility];  Service: General;  Laterality: N/A;    Family History  Problem Relation Age of Onset  . Other Father     deceased after ?TCS or barium enema, age 70s  . Colon cancer Neg Hx   . Liver disease Neg Hx     History   Social History  . Marital Status: Legally Separated    Spouse Name: N/A    Number of Children: N/A  . Years of Education: N/A   Occupational History  . Not on file.   Social History Main Topics  . Smoking status: Former Smoker -- 3.00 packs/day for 40 years    Types: Cigarettes    Quit date: 07/02/2000  . Smokeless tobacco: Not on file  . Alcohol Use: No  Comment: quit 3 months (01/2013). 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  . Drug Use: No  . Sexually Active: No   Other Topics Concern  . Not on file   Social History Narrative  . No narrative on file      ROS:  General: Negative for anorexia, weight loss, fever, chills, fatigue, weakness. Eyes: Negative for vision changes.  ENT: Negative for hoarseness, difficulty swallowing. Recently started amoxicillin for sinusitis. CV: Negative for chest pain, angina, palpitations, dyspnea on exertion, peripheral edema.  Respiratory: Negative for dyspnea at rest, dyspnea on exertion, cough, sputum, wheezing.  GI: See history of present illness. GU:  Negative for dysuria, hematuria, urinary incontinence, urinary frequency, nocturnal urination.  MS: Negative for  joint pain, low back pain.  Derm: Negative for rash or itching.  Neuro: Negative for weakness, abnormal sensation, seizure, frequent headaches, memory loss, confusion.  Psych: Negative for anxiety, depression, suicidal ideation, hallucinations.  Endo: Negative for unusual weight change.  Heme: Negative for bruising or bleeding. Allergy: Negative for rash or hives.    Physical Examination:  BP 155/88  Pulse 68  Temp(Src) 98.3 F (36.8 C) (Oral)  Ht 6' (1.829 m)  Wt 195 lb 12.8 oz (88.814 kg)  BMI 26.55 kg/m2   General: Well-nourished, well-developed in no acute distress.  Head: Normocephalic, atraumatic.   Eyes: Conjunctiva pink, no icterus. Mouth: Oropharyngeal mucosa moist and pink , no lesions erythema or exudate. Neck: Supple without thyromegaly, masses, or lymphadenopathy.  Lungs: Clear to auscultation bilaterally.  Heart: Regular rate and rhythm, no murmurs rubs or gallops.  Abdomen: Bowel sounds are normal, nontender, nondistended, no hepatosplenomegaly or masses, no abdominal bruits or    hernia , no rebound or guarding.   Rectal: not performed Extremities: No lower extremity edema. No clubbing or deformities.  Neuro: Alert and oriented x 4 , grossly normal neurologically.  Skin: Warm and dry, no rash or jaundice.   Psych: Alert and cooperative, normal mood and affect.  Labs: Lab Results  Component Value Date   WBC 8.6 03/13/2013   HGB 14.5 03/13/2013   HCT 40.6 03/13/2013   MCV 94.0 03/13/2013   PLT 174 03/13/2013   Lab Results  Component Value Date   LIPASE 25 03/13/2013   Lab Results  Component Value Date   CREATININE 1.14 03/13/2013   BUN 15 03/13/2013   NA 137 03/13/2013   K 4.6 03/13/2013   CL 101 03/13/2013   CO2 26 03/13/2013   Lab Results  Component Value Date   ALT 24 03/13/2013   AST 27 03/13/2013   ALKPHOS 61 03/13/2013   BILITOT 1.1 03/13/2013   Lab Results  Component Value Date   HGBA1C 7.3* 03/13/2013   Lab Results  Component Value Date   CHOL 187 03/13/2013    HDL 29* 03/13/2013   LDLCALC 142* 03/13/2013   TRIG 79 03/13/2013   CHOLHDL 6.4 03/13/2013      Imaging Studies: No results found.

## 2013-04-02 NOTE — Patient Instructions (Addendum)
We have recommended you have a colonoscopy by Dr. Jena Gauss to evaluate for colon polyps, colon cancer. Please let us know when you decide if you want to proceed. You will need to schedule your colonoscopy within 30 days of this office visit or you will be required to be seen in the office again.  I will have our office help determine what your expense for a routine colonoscopy would be.  Please call if you have any questions or concerns.

## 2013-04-18 ENCOUNTER — Other Ambulatory Visit: Payer: Self-pay | Admitting: Family Medicine

## 2013-05-30 ENCOUNTER — Other Ambulatory Visit: Payer: Self-pay | Admitting: Family Medicine

## 2013-07-07 ENCOUNTER — Other Ambulatory Visit: Payer: Self-pay | Admitting: Family Medicine

## 2013-07-08 ENCOUNTER — Other Ambulatory Visit: Payer: Self-pay | Admitting: *Deleted

## 2013-07-08 MED ORDER — ATENOLOL 100 MG PO TABS: 100.0000 mg | ORAL_TABLET | Freq: Every day | ORAL | Status: DC

## 2013-07-23 ENCOUNTER — Encounter: Payer: Self-pay | Admitting: Family Medicine

## 2013-07-23 ENCOUNTER — Ambulatory Visit (INDEPENDENT_AMBULATORY_CARE_PROVIDER_SITE_OTHER): Payer: Medicare Other | Admitting: Family Medicine

## 2013-07-23 ENCOUNTER — Other Ambulatory Visit: Payer: Self-pay | Admitting: Family Medicine

## 2013-07-23 VITALS — BP 128/70 | Temp 98.5°F | Ht 72.0 in | Wt 189.0 lb

## 2013-07-23 DIAGNOSIS — J019 Acute sinusitis, unspecified: Secondary | ICD-10-CM

## 2013-07-23 MED ORDER — AMOXICILLIN 500 MG PO CAPS: 500.0000 mg | ORAL_CAPSULE | Freq: Three times a day (TID) | ORAL | Status: AC

## 2013-07-23 NOTE — Progress Notes (Signed)
  Subjective:    Patient ID: Peter [name], male    DOB: 1941/05/15, 72 y.o.   MRN: [id number]  Sore Throat  This is a new problem. The current episode started 1 to 4 weeks ago. Neither side of throat is experiencing more pain than the other. Treatments tried: amoxil from urgent care center. The treatment provided moderate relief.   Patient has been working up in [employer] over the past few weeks a lot of humidity as well as he. He relates sore throat some drainage and coughing denies any wheezing or difficulty breathing and not had this problem until the past couple weeks. Had this problem several months ago but did not occur in between time. He denies any high fever chills sweats nausea vomiting currently. He does have chronic health problems for which he states he will set up a regular health followup visit in the near future. He relates compliance with his medicine.  Review of Systems See above.    Objective:   Physical Exam Lungs are clear hearts regular pulse normal extremities no edema skin warm dry neurologic grossly normal in addition to this throat minimal erythema eardrums normal       Assessment & Plan:  URI possible sinusitis amoxicillin over the next 10 days followup if ongoing trouble Patient was told to followup for regular health checkup somewhere by fall time Patient was told of recurring sore throat may need referral to ear nose throat.

## 2013-07-25 ENCOUNTER — Encounter: Payer: Self-pay | Admitting: *Deleted

## 2013-09-24 ENCOUNTER — Other Ambulatory Visit: Payer: Self-pay | Admitting: Family Medicine

## 2013-12-16 ENCOUNTER — Other Ambulatory Visit: Payer: Self-pay | Admitting: *Deleted

## 2013-12-16 MED ORDER — LISINOPRIL 10 MG PO TABS: 10.0000 mg | ORAL_TABLET | Freq: Every day | ORAL | Status: DC

## 2014-02-03 ENCOUNTER — Encounter: Payer: Self-pay | Admitting: Family Medicine

## 2014-02-03 ENCOUNTER — Ambulatory Visit (INDEPENDENT_AMBULATORY_CARE_PROVIDER_SITE_OTHER): Payer: Medicare Other | Admitting: Family Medicine

## 2014-02-03 VITALS — BP 138/86 | Ht 72.0 in | Wt 197.0 lb

## 2014-02-03 DIAGNOSIS — I714 Abdominal aortic aneurysm, without rupture, unspecified: Secondary | ICD-10-CM | POA: Insufficient documentation

## 2014-02-03 DIAGNOSIS — E119 Type 2 diabetes mellitus without complications: Secondary | ICD-10-CM

## 2014-02-03 DIAGNOSIS — E785 Hyperlipidemia, unspecified: Secondary | ICD-10-CM

## 2014-02-03 DIAGNOSIS — I10 Essential (primary) hypertension: Secondary | ICD-10-CM

## 2014-02-03 DIAGNOSIS — Z79899 Other long term (current) drug therapy: Secondary | ICD-10-CM

## 2014-02-03 LAB — POCT GLYCOSYLATED HEMOGLOBIN (HGB A1C): Hemoglobin A1C: 7.1

## 2014-02-03 MED ORDER — ATENOLOL 100 MG PO TABS: 100.0000 mg | ORAL_TABLET | Freq: Every day | ORAL | Status: DC

## 2014-02-03 MED ORDER — LISINOPRIL 10 MG PO TABS: 10.0000 mg | ORAL_TABLET | Freq: Every day | ORAL | Status: DC

## 2014-02-03 MED ORDER — TRIAMCINOLONE ACETONIDE 0.1 % EX CREA: 1.0000 "application " | TOPICAL_CREAM | Freq: Two times a day (BID) | CUTANEOUS | Status: DC

## 2014-02-03 MED ORDER — METFORMIN HCL 500 MG PO TABS: ORAL_TABLET | ORAL | Status: DC

## 2014-02-03 NOTE — Progress Notes (Signed)
   Subjective:    Patient ID: Peter [name], male    DOB: [birth year], 73 y.o.   MRN: [id number]  HPI Patient arrives for a follow up on blood pressure. Patient states he would like a years supply on blood pressure meds. Patient also has a spot on right and a old tick bite on right leg he would like looked at.  Patient also has concerns about COPD and would like lungs examined.   Hx- DM/LIPID/HTN Former smoker Greater than 25 minutes was spent discussing diabetes, hyperlipidemia, hypertension  Review of Systems  Constitutional: Negative for activity change, appetite change and fatigue.  HENT: Negative for congestion.   Respiratory: Negative for cough.   Cardiovascular: Negative for chest pain.  Gastrointestinal: Negative for abdominal pain.  Endocrine: Negative for polydipsia and polyphagia.  Neurological: Negative for weakness.  Psychiatric/Behavioral: Negative for confusion.       Objective:   Physical Exam  Vitals reviewed. Constitutional: He appears well-nourished. No distress.  HENT:  Head: Normocephalic and atraumatic.  Neck: No tracheal deviation present. No thyromegaly present.  Cardiovascular: Normal rate, regular rhythm and normal heart sounds.   No murmur heard. Pulmonary/Chest: Effort normal and breath sounds normal. No respiratory distress.  Abdominal: Soft. He exhibits no distension and no mass. There is no guarding.  Musculoskeletal: He exhibits no edema.  Lymphadenopathy:    He has no cervical adenopathy.  Neurological: He is alert.  Skin: Skin is warm and dry.  Psychiatric: His behavior is normal.   Multiple moles on the back were looked at none of him were concerning for cancer he does have an abrasion with scaling in the right leg does not look like squamous cell       Assessment & Plan:  abd U/S-this patient has a history of aortic aneurysm we need to repeat the ultrasound this was ordered await the findings. Patient was informed the importance of  doing this.  DM-he reluctantly agrees to do metformin 500 mg one half tablet twice a day he relates he will work on diet. Recheck A1c in 3 months he will let us know if any problems with metformin.  Hyperlip-he does not want to be on additional medications but agrees to get lab testing done.  HTN-under decent control currently continue current medications.  Colonoscopy rec- defers- heme times 3 ordered  Try steroid cream on the small spot on the right leg if this doesn't improve over the next few months referral to dermatology

## 2014-02-03 NOTE — Patient Instructions (Signed)
Start Metformin 500 mg- take 1/2 tablet twice  A day  Also take 81 mg aspirin daily to prevent heart attack

## 2014-02-05 ENCOUNTER — Ambulatory Visit (HOSPITAL_COMMUNITY)
Admission: RE | Admit: 2014-02-05 | Discharge: 2014-02-05 | Disposition: A | Discharge status: Home / Self Care | Payer: Medicare Other | Source: Ambulatory Visit | Attending: Family Medicine | Admitting: Family Medicine

## 2014-02-05 DIAGNOSIS — I714 Abdominal aortic aneurysm, without rupture, unspecified: Secondary | ICD-10-CM | POA: Insufficient documentation

## 2014-02-05 LAB — BASIC METABOLIC PANEL
BUN: 14 mg/dL (ref 6–23)
CO2: 27 mEq/L (ref 19–32)
Calcium: 9.5 mg/dL (ref 8.4–10.5)
Chloride: 100 mEq/L (ref 96–112)
Creat: 1.1 mg/dL (ref 0.50–1.35)
Glucose, Bld: 153 mg/dL — ABNORMAL HIGH (ref 70–99)
Potassium: 4.8 mEq/L (ref 3.5–5.3)
Sodium: 137 mEq/L (ref 135–145)

## 2014-02-05 LAB — LIPID PANEL
Cholesterol: 225 mg/dL — ABNORMAL HIGH (ref 0–200)
HDL: 36 mg/dL — ABNORMAL LOW (ref >39)
LDL Cholesterol: 162 mg/dL — ABNORMAL HIGH (ref 0–99)
Total CHOL/HDL Ratio: 6.3 Ratio
Triglycerides: 134 mg/dL (ref <150)
VLDL: 27 mg/dL (ref 0–40)

## 2014-02-06 ENCOUNTER — Other Ambulatory Visit: Payer: Self-pay | Admitting: Family Medicine

## 2014-02-06 MED ORDER — PRAVASTATIN SODIUM 20 MG PO TABS: 20.0000 mg | ORAL_TABLET | Freq: Every evening | ORAL | Status: DC

## 2014-02-06 NOTE — Progress Notes (Signed)
Patient notified and verbalized understanding of the test results. No further questions. 

## 2014-02-06 NOTE — Progress Notes (Signed)
Patient notified and verbalized understanding of the test results. No further questions. Placed in Peter [name].

## 2014-02-07 NOTE — Addendum Note (Signed)
Addended byCharolotte Capuchin D on: 02/07/2014 09:42 AM   Modules accepted: Orders

## 2014-02-07 NOTE — Progress Notes (Signed)
Patient notified and verbalized understanding. 

## 2014-02-18 ENCOUNTER — Telehealth: Payer: Self-pay | Admitting: Family Medicine

## 2014-02-18 NOTE — Telephone Encounter (Signed)
See attached to chart   Call pt when done

## 2014-02-18 NOTE — Telephone Encounter (Signed)
Pt notified to pick up handicap card.

## 2014-04-01 ENCOUNTER — Other Ambulatory Visit: Payer: Self-pay | Admitting: Family Medicine

## 2014-05-28 ENCOUNTER — Other Ambulatory Visit: Payer: Self-pay | Admitting: Family Medicine

## 2014-07-30 ENCOUNTER — Telehealth: Payer: Self-pay | Admitting: *Deleted

## 2014-07-30 NOTE — Telephone Encounter (Signed)
BP was up today bottom number between 105 - 109 he doesn't remember what the top number was. It is now down to 163/99 the last he checked today. No headache or chest pain. Taking lisinopril 10mg  one a day and atenolol 100mg  daily. He also states his blood sugar has been running between  160 -180 fasting. Taking metformin 500mg  one half BID. He states he has not been feeling good. Having fatigue and under a lot of stress.

## 2014-07-31 ENCOUNTER — Emergency Department (HOSPITAL_COMMUNITY)
Admission: EM | Admit: 2014-07-31 | Discharge: 2014-07-31 | Disposition: A | Discharge status: Home / Self Care | Payer: Medicare Other | Attending: Emergency Medicine | Admitting: Emergency Medicine

## 2014-07-31 ENCOUNTER — Encounter (HOSPITAL_COMMUNITY): Payer: Self-pay | Admitting: Emergency Medicine

## 2014-07-31 ENCOUNTER — Emergency Department (HOSPITAL_COMMUNITY): Payer: Medicare Other

## 2014-07-31 DIAGNOSIS — Z87891 Personal history of nicotine dependence: Secondary | ICD-10-CM | POA: Diagnosis not present

## 2014-07-31 DIAGNOSIS — H9319 Tinnitus, unspecified ear: Secondary | ICD-10-CM | POA: Insufficient documentation

## 2014-07-31 DIAGNOSIS — I2510 Atherosclerotic heart disease of native coronary artery without angina pectoris: Secondary | ICD-10-CM | POA: Diagnosis not present

## 2014-07-31 DIAGNOSIS — Z9861 Coronary angioplasty status: Secondary | ICD-10-CM | POA: Diagnosis not present

## 2014-07-31 DIAGNOSIS — I252 Old myocardial infarction: Secondary | ICD-10-CM | POA: Diagnosis not present

## 2014-07-31 DIAGNOSIS — E119 Type 2 diabetes mellitus without complications: Secondary | ICD-10-CM | POA: Insufficient documentation

## 2014-07-31 DIAGNOSIS — Z951 Presence of aortocoronary bypass graft: Secondary | ICD-10-CM | POA: Insufficient documentation

## 2014-07-31 DIAGNOSIS — I10 Essential (primary) hypertension: Secondary | ICD-10-CM | POA: Diagnosis present

## 2014-07-31 DIAGNOSIS — R5381 Other malaise: Secondary | ICD-10-CM | POA: Insufficient documentation

## 2014-07-31 DIAGNOSIS — Z7982 Long term (current) use of aspirin: Secondary | ICD-10-CM | POA: Insufficient documentation

## 2014-07-31 DIAGNOSIS — Z79899 Other long term (current) drug therapy: Secondary | ICD-10-CM | POA: Diagnosis not present

## 2014-07-31 DIAGNOSIS — R5383 Other fatigue: Secondary | ICD-10-CM

## 2014-07-31 DIAGNOSIS — R0789 Other chest pain: Secondary | ICD-10-CM | POA: Diagnosis not present

## 2014-07-31 LAB — BASIC METABOLIC PANEL
Anion gap: 11 (ref 5–15)
BUN: 16 mg/dL (ref 6–23)
CO2: 25 mEq/L (ref 19–32)
Calcium: 9 mg/dL (ref 8.4–10.5)
Chloride: 100 mEq/L (ref 96–112)
Creatinine, Ser: 1.06 mg/dL (ref 0.50–1.35)
GFR calc Af Amer: 79 mL/min — ABNORMAL LOW (ref >90)
GFR calc non Af Amer: 68 mL/min — ABNORMAL LOW (ref >90)
Glucose, Bld: 176 mg/dL — ABNORMAL HIGH (ref 70–99)
Potassium: 4.2 mEq/L (ref 3.7–5.3)
Sodium: 136 mEq/L — ABNORMAL LOW (ref 137–147)

## 2014-07-31 LAB — CBC WITH DIFFERENTIAL/PLATELET
Basophils Absolute: 0 10*3/uL (ref 0.0–0.1)
Basophils Relative: 0 % (ref 0–1)
Eosinophils Absolute: 0.2 10*3/uL (ref 0.0–0.7)
Eosinophils Relative: 3 % (ref 0–5)
HCT: 41.7 % (ref 39.0–52.0)
Hemoglobin: 15 g/dL (ref 13.0–17.0)
Lymphocytes Relative: 34 % (ref 12–46)
Lymphs Abs: 2.1 10*3/uL (ref 0.7–4.0)
MCH: 34.1 pg — ABNORMAL HIGH (ref 26.0–34.0)
MCHC: 36 g/dL (ref 30.0–36.0)
MCV: 94.8 fL (ref 78.0–100.0)
Monocytes Absolute: 0.4 10*3/uL (ref 0.1–1.0)
Monocytes Relative: 7 % (ref 3–12)
Neutro Abs: 3.4 10*3/uL (ref 1.7–7.7)
Neutrophils Relative %: 56 % (ref 43–77)
Platelets: 98 10*3/uL — ABNORMAL LOW (ref 150–400)
RBC: 4.4 MIL/uL (ref 4.22–5.81)
RDW: 12.8 % (ref 11.5–15.5)
WBC: 6.2 10*3/uL (ref 4.0–10.5)

## 2014-07-31 LAB — TROPONIN I: Troponin I: <0.3 ng/mL (ref <0.30)

## 2014-07-31 MED ORDER — NITROGLYCERIN 0.4 MG SL SUBL: 0.4000 mg | SUBLINGUAL_TABLET | SUBLINGUAL | Status: DC | PRN

## 2014-07-31 NOTE — ED Provider Notes (Signed)
CSN: [id number]     Arrival date & time 07/31/14  1150 History   This chart was scribed for Janice Norrie, MD, by Neta Ehlers, ED Scribe. This patient was seen in room APA19/AP[facility]19 and the patient's care was started at 12:24 PM.  First MD Initiated Contact with Patient 07/31/14 1209     Chief Complaint  Patient presents with  . Hypertension    The history is provided by the patient. No language interpreter was used.   HPI Comments: OSHAY [name] is a 73 y.o. male, with a h/o MI, HTN, and DM, who presents to the Emergency Department complaining of elevated blood pressure. He states that when he measured it at home it was 200/110 this morning; in the ED his BP is 151/78. He reports he measured his BP because he "felt different."   He also states intermittent mild chest pain which he characterizes as a "ping." He denies burning associated with the pain. He denies the current chest pain as similar to his previous MI in 1997. He states his symptoms with the MI included diaphoresis; he did not have chest pain at that time. He reports increased stress in the past three months and he states he experienced increased stress prior to his previous MI. He reports  open heart surgery in 1997. He had stents placed in 2003 and 2007. He is not followed by a cardiologist now but was seeing Dr Lattie Haw.   He also reports that his blood sugar has not been controlled, and that he will "feel crazy" like he is weak. He also states that with elevated blood sugar, he experiences  a "burning" to his chest with exertion. He states these symptoms have occurred intermittently for a year.   He denies headache, nausea, emesis, blurred vision, lower extremity swelling, numbness/paresthesia to his extremities. He denies chest pain at this time.   He states that intermittently he experiences a sensation to his arm "as if it is under stress- like indigestion." It lasts 1 second.   Peter [name] also reports intermittent sounds  like a "heart beat or someone typing" in his right ear. He denies the sound as being in concordance with is heart beat. He endorses a h/o tinnitus. He also reports trauma to the ear 35 years ago.   He is a former smoker. He quit in 1998; he "lit up" three packs a day. Peter [name] states that he had gone a year without drinking. But in the past few months, he has started drinking 4-5 beers a day, though he has not had any beer in the past week.  The pt 's wife is in a nursing home and needed a dialysis port removed yesterday at [facility]. He was with her in the hospital.  He states his wife will be in the nursing home permanently as she has vascular dementia. He reports a lot of stress with her medical illnesses she has had in the past 4 months.   PCP Dr [facility] Dr Lattie Haw   Past Medical History  Diagnosis Date  . Myocardial infarction 1997    stents x2 (2003/2007)  . Diabetes mellitus     diet controlled  . AAA (abdominal aortic aneurysm)     3.6 X 3.4cm on u/s 06/2012  . Hypertension   . CAD (coronary artery disease)   . Dyslipidemia    Past Surgical History  Procedure Laterality Date  . Cystectomy  2005    top of head-Jenkins  .  Coronary angioplasty  2003/2007    2 stents  . 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 Ear cyst excision  07/09/2012    Procedure: CYST REMOVAL;  Surgeon: Jamesetta So, MD;  Location: [facility];  Service: General;  Laterality: N/A;   Family History  Problem Relation Age of Onset  . Other Father     deceased after ?TCS or barium enema, age 53s  . Hypertension Father   . Colon cancer Neg Hx   . Liver disease Neg Hx   . Heart attack Mother    History  Substance Use Topics  . Smoking status: Former Smoker -- 3.00 packs/day for 40 years    Types: Cigarettes    Quit date: 07/02/2000  . Smokeless tobacco: Not on file  . Alcohol Use: No     Comment: quit 3 months (01/2013). 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  living alone while wife is in NH  Review of Systems  HENT: Positive for tinnitus.   Eyes: Negative for visual disturbance.  Cardiovascular: Positive for chest pain. Negative for leg swelling.  Gastrointestinal: Negative for nausea and vomiting.  Musculoskeletal: Positive for myalgias.  Neurological: Positive for weakness. Negative for numbness and headaches.  All other systems reviewed and are negative.    Allergies  Dye fdc red and Ivp dye  Home Medications   Prior to Admission medications   Medication Sig Start Date End Date Taking? Authorizing Provider  atenolol (TENORMIN) 100 MG tablet Take 1 tablet (100 mg total) by mouth daily. 02/03/14  Yes Kathyrn Drown, MD  lisinopril (PRINIVIL,ZESTRIL) 10 MG tablet Take 1 tablet (10 mg total) by mouth daily. 02/03/14  Yes Kathyrn Drown, MD  aspirin 325 MG EC tablet Take 325 mg by mouth as needed (lower blood pressure).    Historical Provider, MD  aspirin 81 MG tablet Take 81 mg by mouth as needed (lower blood pressure).     Historical Provider, MD  metFORMIN (GLUCOPHAGE) 500 MG tablet Take 250 mg by mouth 2 (two) times daily with a meal.    Historical Provider, MD  nitroGLYCERIN (NITROSTAT) 0.4 MG SL tablet Place 1 tablet (0.4 mg total) under the tongue every 5 (five) minutes as needed for chest pain. 07/31/14   Janice Norrie, MD   Triage Vitals: BP 189/86  Pulse 63  Temp(Src) 99 F (37.2 C) (Oral)  Resp 19  Ht 5\' 10"  (1.778 m)  Wt 200 lb (90.719 kg)  BMI 28.70 kg/m2  SpO2 99%  Vital signs normal except hypertension   Physical Exam  Nursing note and vitals reviewed. Constitutional: He is oriented to person, place, and time. He appears well-developed and well-nourished.  Non-toxic appearance. He does not appear ill. No distress.  HENT:  Head: Normocephalic and atraumatic.  Right Ear: External ear normal.  Left Ear: External ear normal.  Nose: Nose normal. No mucosal edema or rhinorrhea.  Mouth/Throat: Oropharynx is clear  and moist and mucous membranes are normal. No dental abscesses or uvula swelling.  Eyes: Conjunctivae and EOM are normal. Pupils are equal, round, and reactive to light.  Neck: Normal range of motion and full passive range of motion without pain. Neck supple.  Cardiovascular: Normal rate, regular rhythm and normal heart sounds.  Exam reveals no gallop and no friction rub.   No murmur heard. Pulmonary/Chest: Effort normal and breath sounds normal. No respiratory distress. He has no wheezes. He has  no rhonchi. He has no rales. He exhibits no tenderness and no crepitus.  Abdominal: Soft. Normal appearance and bowel sounds are normal. He exhibits no distension. There is no tenderness. There is no rebound and no guarding.  Musculoskeletal: Normal range of motion. He exhibits no edema and no tenderness.  Moves all extremities well.   Neurological: He is alert and oriented to person, place, and time. He has normal strength. No cranial nerve deficit.  Skin: Skin is warm, dry and intact. No rash noted. No erythema. No pallor.  Psychiatric: He has a normal mood and affect. His speech is normal and behavior is normal. His mood appears not anxious.    ED Course  Procedures (including critical care time)  DIAGNOSTIC STUDIES: Oxygen Saturation is 99% on room air, normal by my interpretation.    COORDINATION OF CARE:  12:37 PM- Discussed treatment plan with patient, and the patient agreed to the plan.   13:30 BP 137/79 without treatment.   14:20 when I went to discuss discharge with patient he now states he has "a funny feeling" in the right side of his face that started yesterday. He denies numbness, states it feels "like when you sleep on your face wrong". Pt has no facial drooping. He is agreeable to get MRI, hopefully normal and discharge. We discussed using SL NTG for the chest burning and to get back with a cardiologist for further evaluation.   15:50 pt discharged after MR shows no acute  changes  Labs Review Results for orders placed during the hospital encounter of 07/31/14  CBC WITH DIFFERENTIAL      Result Value Ref Range   WBC 6.2  4.0 - 10.5 K/uL   RBC 4.40  4.22 - 5.81 MIL/uL   Hemoglobin 15.0  13.0 - 17.0 g/dL   HCT 41.7  39.0 - 52.0 %   MCV 94.8  78.0 - 100.0 fL   MCH 34.1 (*) 26.0 - 34.0 pg   MCHC 36.0  30.0 - 36.0 g/dL   RDW 12.8  11.5 - 15.5 %   Platelets 98 (*) 150 - 400 K/uL   Neutrophils Relative % 56  43 - 77 %   Neutro Abs 3.4  1.7 - 7.7 K/uL   Lymphocytes Relative 34  12 - 46 %   Lymphs Abs 2.1  0.7 - 4.0 K/uL   Monocytes Relative 7  3 - 12 %   Monocytes Absolute 0.4  0.1 - 1.0 K/uL   Eosinophils Relative 3  0 - 5 %   Eosinophils Absolute 0.2  0.0 - 0.7 K/uL   Basophils Relative 0  0 - 1 %   Basophils Absolute 0.0  0.0 - 0.1 K/uL  BASIC METABOLIC PANEL      Result Value Ref Range   Sodium 136 (*) 137 - 147 mEq/L   Potassium 4.2  3.7 - 5.3 mEq/L   Chloride 100  96 - 112 mEq/L   CO2 25  19 - 32 mEq/L   Glucose, Bld 176 (*) 70 - 99 mg/dL   BUN 16  6 - 23 mg/dL   Creatinine, Ser 1.06  0.50 - 1.35 mg/dL   Calcium 9.0  8.4 - 10.5 mg/dL   GFR calc non Af Amer 68 (*) >90 mL/min   GFR calc Af Amer 79 (*) >90 mL/min   Anion gap 11  5 - 15  TROPONIN I      Result Value Ref Range   Troponin I <0.30  <0.30  ng/mL   Laboratory interpretation all normal except hyperglycemia     Imaging Review Mr Brain Wo Contrast  07/31/2014   CLINICAL DATA:  Hypertension.  Right facial dysesthesias.  EXAM: MRI HEAD WITHOUT CONTRAST  TECHNIQUE: Multiplanar, multiecho pulse sequences of the brain and surrounding structures were obtained without intravenous contrast.  COMPARISON:  MRI 01/01/2004  FINDINGS: Mild atrophy. Mild chronic microvascular ischemic change in the white matter. Basal ganglia and brainstem are normal. Cerebellum normal.  Negative for acute infarct.  Negative for hemorrhage or mass lesion.  Mild mucosal edema in the paranasal sinuses.   Craniocervical junction normal.  Pituitary not enlarged.  IMPRESSION: Normal for age.  Mild atrophy and mild chronic white matter changes   Electronically Signed   By: Franchot Gallo M.D.   On: 07/31/2014 16:39     EKG Interpretation   Date/Time:  Thursday July 31 2014 12:06:04 EDT Ventricular Rate:  60 PR Interval:  200 QRS Duration: 104 QT Interval:  455 QTC Calculation: 455 R Axis:   -39 Text Interpretation:  Sinus rhythm Abnormal R-wave progression, late  transition Inferior infarct, old Lateral leads are also involved T-wave  inversion in Lateral leads No significant change since last tracing 02 Jul 2012 Confirmed by Dareen Gutzwiller  MD-I, Dacie Mandel ([id number]) on 07/31/2014 12:43:34 PM      MDM   Final diagnoses:  Atypical chest pain    New Prescriptions   NITROGLYCERIN (NITROSTAT) 0.4 MG SL TABLET    Place 1 tablet (0.4 mg total) under the tongue every 5 (five) minutes as needed for chest pain.     Plan discharge  Peter Porter, MD, FACEP   I personally performed the services described in this documentation, which was scribed in my presence. The recorded information has been reviewed and considered.  Peter Porter, MD, Abram Sander     Janice Norrie, MD 07/31/14 [phone]

## 2014-07-31 NOTE — Discharge Instructions (Signed)
Your blood pressure improved while in the ED without specific treatment. You should consider seeing a cardiologist again for the chest pain you have been having with exertion. Try the nitroglycerin if it happens again. Go to the nearest ED if you get chest pain that is lasting more than 20-30 minuets constantly. You can have your ear rechecked by Dr Benjamine Mola, call his office for an appointment and you can see him in [place] or in [place].

## 2014-07-31 NOTE — ED Notes (Signed)
Pt states his BP has been running high for a few days. Pt has extensive cardiac HX including MI and AAA. Pt concerned about high trending readings.

## 2014-07-31 NOTE — Telephone Encounter (Signed)
Patient had to go to the ER on the 27th. I would like to see him for followup next week. Please call him

## 2014-08-01 NOTE — Telephone Encounter (Signed)
Patient transferred to front desk to schedule appointment.  

## 2014-08-06 ENCOUNTER — Ambulatory Visit (INDEPENDENT_AMBULATORY_CARE_PROVIDER_SITE_OTHER): Payer: Medicare Other | Admitting: Family Medicine

## 2014-08-06 ENCOUNTER — Encounter: Payer: Self-pay | Admitting: Family Medicine

## 2014-08-06 VITALS — BP 138/88 | Ht 72.0 in | Wt 195.0 lb

## 2014-08-06 DIAGNOSIS — R079 Chest pain, unspecified: Secondary | ICD-10-CM

## 2014-08-06 DIAGNOSIS — I10 Essential (primary) hypertension: Secondary | ICD-10-CM

## 2014-08-06 DIAGNOSIS — R9431 Abnormal electrocardiogram [ECG] [EKG]: Secondary | ICD-10-CM

## 2014-08-06 DIAGNOSIS — E119 Type 2 diabetes mellitus without complications: Secondary | ICD-10-CM

## 2014-08-06 NOTE — Progress Notes (Signed)
   Subjective:    Patient ID: Peter [name], male    DOB: 1941/06/06, 73 y.o.   MRN: [id number]  Hypertension This is a chronic problem. Past treatments include lifestyle changes. Compliance problems include diet and exercise.    Patient has concerns about ringing in ears. Moles on the top of his head. And patient states that his blood pressure went up last week @ [facility] hospital while there with his wife for her procedure. Patient stated he felt bad & asked for a BP check & it was 188/90. Came to [facility] hospital following day BP was high again. While there he had a MRI brain scan to see if he may have had a stroke. Results of MRI he was told was negative. 30 minutes was spent with this patient discussing what is going on with him plus also talking about the stress he is undergoing with his wife's illness  Review of Systems    he does relate some shortness of breath with activity he also relates blood pressure being up a few different times under a lot of stress Objective:   Physical Exam Lungs are clear heart regular pulse normal blood pressure checked twice is good abdomen soft extremities no edema  He does have a lesion on his head but it does not look cancerous referral to dermatology will be necessary but that will be down around once cardiology has finished     Assessment & Plan:  30-35 minutes spent with this patient. He does have nitroglycerin at home. He does have adequate medication blood pressure is good  Benign appearing dermatology consult will be pending until cardiology has finished  He does have abdominal aortic aneurysm but I do not believe this is playing a role in his current problem  I suspect this the patient is developing some angina related to previous coronary artery disease he gets shortness of breath with walking he is diabetic so he will not get the classic symptoms. He has a T-wave abnormality on the lateral EKG which indicates possibility of ischemia versus strain. The  T-wave is very symmetrical inverted.  This patient was counseled that should he have severe pain and discomfort he needs to go to emergency department or call 911  This patient is a minimalist when it comes to medications. He admits that he doesn't always take his metformin he also admits that he stopped taking the statin because he didn't feel he needed. I comes him that his LDL should be below 70 and it is not. We will bring this patient back in 4-6 weeks we will go ahead and refer to cardiology appointment was set up for next week.

## 2014-08-07 LAB — HEPATIC FUNCTION PANEL
ALT: 25 U/L (ref 0–53)
AST: 26 U/L (ref 0–37)
Albumin: 4.1 g/dL (ref 3.5–5.2)
Alkaline Phosphatase: 61 U/L (ref 39–117)
Bilirubin, Direct: 0.2 mg/dL (ref 0.0–0.3)
Indirect Bilirubin: 0.7 mg/dL (ref 0.2–1.2)
Total Bilirubin: 0.9 mg/dL (ref 0.2–1.2)
Total Protein: 7.1 g/dL (ref 6.0–8.3)

## 2014-08-07 LAB — HEMOGLOBIN A1C
Hgb A1c MFr Bld: 7 % — ABNORMAL HIGH (ref <5.7)
Mean Plasma Glucose: 154 mg/dL — ABNORMAL HIGH (ref <117)

## 2014-08-07 LAB — BASIC METABOLIC PANEL
BUN: 12 mg/dL (ref 6–23)
CO2: 27 mEq/L (ref 19–32)
Calcium: 9 mg/dL (ref 8.4–10.5)
Chloride: 100 mEq/L (ref 96–112)
Creat: 1.05 mg/dL (ref 0.50–1.35)
Glucose, Bld: 134 mg/dL — ABNORMAL HIGH (ref 70–99)
Potassium: 4.5 mEq/L (ref 3.5–5.3)
Sodium: 138 mEq/L (ref 135–145)

## 2014-08-07 LAB — LIPID PANEL
Cholesterol: 183 mg/dL (ref 0–200)
HDL: 36 mg/dL — ABNORMAL LOW (ref >39)
LDL Cholesterol: 124 mg/dL — ABNORMAL HIGH (ref 0–99)
Total CHOL/HDL Ratio: 5.1 Ratio
Triglycerides: 115 mg/dL (ref <150)
VLDL: 23 mg/dL (ref 0–40)

## 2014-08-12 ENCOUNTER — Encounter: Payer: Self-pay | Admitting: Cardiology

## 2014-08-12 ENCOUNTER — Other Ambulatory Visit: Payer: Self-pay | Admitting: Cardiology

## 2014-08-12 ENCOUNTER — Encounter (HOSPITAL_COMMUNITY): Payer: Self-pay

## 2014-08-12 ENCOUNTER — Ambulatory Visit (INDEPENDENT_AMBULATORY_CARE_PROVIDER_SITE_OTHER): Payer: Medicare Other | Admitting: Cardiology

## 2014-08-12 VITALS — BP 142/98 | HR 60 | Ht 71.0 in | Wt 194.0 lb

## 2014-08-12 DIAGNOSIS — I714 Abdominal aortic aneurysm, without rupture, unspecified: Secondary | ICD-10-CM

## 2014-08-12 DIAGNOSIS — I10 Essential (primary) hypertension: Secondary | ICD-10-CM

## 2014-08-12 DIAGNOSIS — I2510 Atherosclerotic heart disease of native coronary artery without angina pectoris: Secondary | ICD-10-CM

## 2014-08-12 DIAGNOSIS — E785 Hyperlipidemia, unspecified: Secondary | ICD-10-CM

## 2014-08-12 DIAGNOSIS — R0789 Other chest pain: Secondary | ICD-10-CM

## 2014-08-12 MED ORDER — PREDNISONE 20 MG PO TABS: ORAL_TABLET | ORAL | Status: DC

## 2014-08-12 MED ORDER — ATORVASTATIN CALCIUM 80 MG PO TABS: 80.0000 mg | ORAL_TABLET | Freq: Every day | ORAL | Status: DC

## 2014-08-12 MED ORDER — AMLODIPINE BESYLATE 5 MG PO TABS: 5.0000 mg | ORAL_TABLET | Freq: Every day | ORAL | Status: DC

## 2014-08-12 NOTE — Patient Instructions (Addendum)
Your physician recommends that you schedule a follow-up appointment in: To be determined after heart cath   Your physician has recommended you make the following change in your medication:     START Lipitor 80 mg daily  START Norvasc 5 mg daily    Your physician has requested that you have a cardiac catheterization Thursday September 10 th at 12 pm,arrive at 10 am Cardiac catheterization is used to diagnose and/or treat various heart conditions. Doctors may recommend this procedure for a number of different reasons. The most common reason is to evaluate chest pain. Chest pain can be a symptom of coronary artery disease (CAD), and cardiac catheterization can show whether plaque is narrowing or blocking your heart's arteries. This procedure is also used to evaluate the valves, as well as measure the blood flow and oxygen levels in different parts of your heart. For further information please visit [facility]. Please follow instruction sheet, as given.  PLEASE TAKE THE FOLLOWING MEDICATIONS BEFORE YOUR CATH  Take Prednisone 60 mg (3- 20 mg tablets) at 6 pm the nite before your cath and and again at 6 am the morning of your cath Take Benadryl 25 mg at 6 pm the nite before your cath and again at 6 am the morning of your cath Take zantac 150 mg at 6 pm the nite before your cath and again at 6 am the morning of your cath      Thank you for choosing [facility] !

## 2014-08-12 NOTE — Progress Notes (Addendum)
Clinical Summary Peter [name] is a 73 y.o.male seen today as a new patient for the following medical problems.  1. CAD - hx of prior CABG 1997 at [facility]  - stent to SVG-PDA and PTCA of the LAD in 05/2002. Normal LVEF at that time - 2008 heart cath [facility] had repeat stenting, he is unsure of the details - episodes of chest pain starting 2 weeks ago. Described as burning in midchest, 3/10. Typically comes on with exertion, no SOB. Does not occur at rest. Symptoms last just a few minutes, resolves with rest.  - no LE edema, no orthopnea.  - compliant with meds - seen in ER 07/2014, chronic TWI lateral precoridal leads old. Trop neg.   2. AAA - stable by [place] 02/2014 at 3.6 cm   3. HTN - compliant with meds  4. Hyperlipidemia - he stopped statin on his own, no particular reason. - 08/2014 TC 183 TG 115 HDL 36 LDL 124    Past Medical History  Diagnosis Date  . Myocardial infarction 1997    stents x2 (2003/2007)  . Diabetes mellitus     diet controlled  . AAA (abdominal aortic aneurysm)     3.6 X 3.4cm on u/s 06/2012  . Hypertension   . CAD (coronary artery disease)   . Dyslipidemia      Allergies  Allergen Reactions  . Dye Fdc Red [Red Dye]     hives  . Ivp Dye [Iodinated Diagnostic Agents] Hives     Current Outpatient Prescriptions  Medication Sig Dispense Refill  . aspirin 81 MG tablet Take 81 mg by mouth as needed (lower blood pressure).       Marland Kitchen atenolol (TENORMIN) 100 MG tablet Take 1 tablet (100 mg total) by mouth daily.  90 tablet  1  . lisinopril (PRINIVIL,ZESTRIL) 10 MG tablet Take 1 tablet (10 mg total) by mouth daily.  90 tablet  1  . metFORMIN (GLUCOPHAGE) 500 MG tablet Take 250 mg by mouth 2 (two) times daily with a meal.      . nitroGLYCERIN (NITROSTAT) 0.4 MG SL tablet Place 1 tablet (0.4 mg total) under the tongue every 5 (five) minutes as needed for chest pain.  30 tablet  0   No current facility-administered medications for this visit.      Past Surgical History  Procedure Laterality Date  . Cystectomy  2005    top of head-Jenkins  . Coronary angioplasty  2003/2007    2 stents  . 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 Ear cyst excision  07/09/2012    Procedure: CYST REMOVAL;  Surgeon: Jamesetta So, MD;  Location: [facility];  Service: General;  Laterality: N/A;     Allergies  Allergen Reactions  . Dye Fdc Red [Red Dye]     hives  . Ivp Dye [Iodinated Diagnostic Agents] Hives      Family History  Problem Relation Age of Onset  . Other Father     deceased after ?TCS or barium enema, age 89s  . Hypertension Father   . Colon cancer Neg Hx   . Liver disease Neg Hx   . Heart attack Mother      Social History Mr. Kissling reports that he quit smoking about 14 years ago. His smoking use included Cigarettes. He has a 120 pack-year smoking history. He does not have any smokeless tobacco history on file. Mr. Haik reports that he does  not drink alcohol.   Review of Systems CONSTITUTIONAL: No weight loss, fever, chills, weakness or fatigue.  HEENT: Eyes: No visual loss, blurred vision, double vision or yellow sclerae.No hearing loss, sneezing, congestion, runny nose or sore throat.  SKIN: No rash or itching.  CARDIOVASCULAR: per HPI RESPIRATORY: No shortness of breath, cough or sputum.  GASTROINTESTINAL: No anorexia, nausea, vomiting or diarrhea. No abdominal pain or blood.  GENITOURINARY: No burning on urination, no polyuria NEUROLOGICAL: No headache, dizziness, syncope, paralysis, ataxia, numbness or tingling in the extremities. No change in bowel or bladder control.  MUSCULOSKELETAL: No muscle, back pain, joint pain or stiffness.  LYMPHATICS: No enlarged nodes. No history of splenectomy.  PSYCHIATRIC: No history of depression or anxiety.  ENDOCRINOLOGIC: No reports of sweating, cold or heat intolerance. No polyuria or polydipsia.  Marland Kitchen   Physical Examination p 60 bp 142/98 Wt 194 lbs BMI  27 Gen: resting comfortably, no acute distress HEENT: no scleral icterus, pupils equal round and reactive, no palptable cervical adenopathy,  CV: RRR, no m/r/g, no JVD, no carotid bruits Resp: Clear to auscultation bilaterally GI: abdomen is soft, non-tender, non-distended, normal bowel sounds, no hepatosplenomegaly MSK: extremities are warm, no edema.  Skin: warm, no rash Neuro:  no focal deficits Psych: appropriate affect   Diagnostic Studies     Assessment and Plan  1. CAD - patient with new onset exertional chest pain starting approx 2 weeks ago - mild, stable symptoms since onset. We discussed non-invasive testing for further risk stratification, however he is very concerned given his past history and past presentation and would prefer to undergo a definitive diagnostic cath - will add norvasc as additional antianginal - will arrange LHC with [facility] ang[facility] at [facility] - history of dye allergy, our nurses have arranged the appropriate protocol  2. AAA - continue routine surveillance, stable by last [place]  3. HTN - follow after adding norvac  4. Hyperlipidemia - change to high dose statin in setting CAD     Arnoldo Lenis, M.D., F.A.C.C.

## 2014-08-14 ENCOUNTER — Encounter (HOSPITAL_COMMUNITY)
Admission: RE | Disposition: A | Discharge status: Home / Self Care | Payer: Medicare Other | Source: Ambulatory Visit | Attending: Cardiovascular Disease

## 2014-08-14 ENCOUNTER — Ambulatory Visit (HOSPITAL_COMMUNITY)
Admission: RE | Admit: 2014-08-14 | Discharge: 2014-08-15 | Disposition: A | Discharge status: Home / Self Care | Payer: Medicare Other | Source: Ambulatory Visit | Attending: Cardiovascular Disease | Admitting: Cardiovascular Disease

## 2014-08-14 DIAGNOSIS — E1142 Type 2 diabetes mellitus with diabetic polyneuropathy: Secondary | ICD-10-CM | POA: Diagnosis present

## 2014-08-14 DIAGNOSIS — I208 Other forms of angina pectoris: Secondary | ICD-10-CM | POA: Diagnosis present

## 2014-08-14 DIAGNOSIS — I25810 Atherosclerosis of coronary artery bypass graft(s) without angina pectoris: Secondary | ICD-10-CM | POA: Diagnosis not present

## 2014-08-14 DIAGNOSIS — Z955 Presence of coronary angioplasty implant and graft: Secondary | ICD-10-CM

## 2014-08-14 DIAGNOSIS — I714 Abdominal aortic aneurysm, without rupture, unspecified: Secondary | ICD-10-CM | POA: Diagnosis not present

## 2014-08-14 DIAGNOSIS — I2510 Atherosclerotic heart disease of native coronary artery without angina pectoris: Secondary | ICD-10-CM | POA: Insufficient documentation

## 2014-08-14 DIAGNOSIS — E1122 Type 2 diabetes mellitus with diabetic chronic kidney disease: Secondary | ICD-10-CM | POA: Diagnosis present

## 2014-08-14 DIAGNOSIS — J449 Chronic obstructive pulmonary disease, unspecified: Secondary | ICD-10-CM | POA: Diagnosis not present

## 2014-08-14 DIAGNOSIS — Z91041 Radiographic dye allergy status: Secondary | ICD-10-CM | POA: Diagnosis not present

## 2014-08-14 DIAGNOSIS — Z87891 Personal history of nicotine dependence: Secondary | ICD-10-CM | POA: Insufficient documentation

## 2014-08-14 DIAGNOSIS — E785 Hyperlipidemia, unspecified: Secondary | ICD-10-CM | POA: Diagnosis present

## 2014-08-14 DIAGNOSIS — I10 Essential (primary) hypertension: Secondary | ICD-10-CM | POA: Diagnosis present

## 2014-08-14 DIAGNOSIS — E119 Type 2 diabetes mellitus without complications: Secondary | ICD-10-CM | POA: Diagnosis not present

## 2014-08-14 DIAGNOSIS — I25709 Atherosclerosis of coronary artery bypass graft(s), unspecified, with unspecified angina pectoris: Secondary | ICD-10-CM | POA: Diagnosis present

## 2014-08-14 DIAGNOSIS — Z889 Allergy status to unspecified drugs, medicaments and biological substances status: Secondary | ICD-10-CM

## 2014-08-14 DIAGNOSIS — I200 Unstable angina: Secondary | ICD-10-CM

## 2014-08-14 DIAGNOSIS — J4489 Other specified chronic obstructive pulmonary disease: Secondary | ICD-10-CM | POA: Insufficient documentation

## 2014-08-14 DIAGNOSIS — R0789 Other chest pain: Secondary | ICD-10-CM

## 2014-08-14 DIAGNOSIS — Z7982 Long term (current) use of aspirin: Secondary | ICD-10-CM | POA: Diagnosis not present

## 2014-08-14 DIAGNOSIS — T50995A Adverse effect of other drugs, medicaments and biological substances, initial encounter: Secondary | ICD-10-CM | POA: Diagnosis present

## 2014-08-14 DIAGNOSIS — I2089 Other forms of angina pectoris: Secondary | ICD-10-CM | POA: Diagnosis present

## 2014-08-14 HISTORY — PX: LEFT HEART CATHETERIZATION WITH CORONARY/GRAFT ANGIOGRAM: SHX5450

## 2014-08-14 HISTORY — PX: CORONARY ANGIOPLASTY WITH STENT PLACEMENT: SHX49

## 2014-08-14 LAB — CBC
HCT: 40.4 % (ref 39.0–52.0)
Hemoglobin: 14.5 g/dL (ref 13.0–17.0)
MCH: 33.3 pg (ref 26.0–34.0)
MCHC: 35.9 g/dL (ref 30.0–36.0)
MCV: 92.9 fL (ref 78.0–100.0)
Platelets: 137 10*3/uL — ABNORMAL LOW (ref 150–400)
RBC: 4.35 MIL/uL (ref 4.22–5.81)
RDW: 12.6 % (ref 11.5–15.5)
WBC: 12.4 10*3/uL — ABNORMAL HIGH (ref 4.0–10.5)

## 2014-08-14 LAB — PROTIME-INR
INR: 1.12 (ref 0.00–1.49)
Prothrombin Time: 14.4 seconds (ref 11.6–15.2)

## 2014-08-14 LAB — GLUCOSE, CAPILLARY
Glucose-Capillary: 140 mg/dL — ABNORMAL HIGH (ref 70–99)
Glucose-Capillary: 210 mg/dL — ABNORMAL HIGH (ref 70–99)

## 2014-08-14 LAB — POCT ACTIVATED CLOTTING TIME
Activated Clotting Time: 202 seconds
Activated Clotting Time: 270 seconds

## 2014-08-14 SURGERY — LEFT HEART CATHETERIZATION WITH CORONARY/GRAFT ANGIOGRAM
Anesthesia: LOCAL

## 2014-08-14 MED ORDER — INSULIN ASPART 100 UNIT/ML ~~LOC~~ SOLN: 0.0000 [IU] | Freq: Every day | SUBCUTANEOUS | Status: DC

## 2014-08-14 MED ORDER — LIDOCAINE HCL (PF) 1 % IJ SOLN
INTRAMUSCULAR | Status: AC
  Filled 2014-08-14: qty 30

## 2014-08-14 MED ORDER — ATENOLOL 100 MG PO TABS
100.0000 mg | ORAL_TABLET | Freq: Every day | ORAL | Status: DC
  Administered 2014-08-15: 100 mg via ORAL
  Filled 2014-08-14: qty 1

## 2014-08-14 MED ORDER — SODIUM CHLORIDE 0.9 % IJ SOLN: 3.0000 mL | INTRAMUSCULAR | Status: DC | PRN

## 2014-08-14 MED ORDER — SODIUM CHLORIDE 0.9 % IV SOLN: 250.0000 mL | INTRAVENOUS | Status: DC | PRN

## 2014-08-14 MED ORDER — PRASUGREL HCL 10 MG PO TABS
ORAL_TABLET | ORAL | Status: AC
  Filled 2014-08-14: qty 1

## 2014-08-14 MED ORDER — HEPARIN (PORCINE) IN NACL 2-0.9 UNIT/ML-% IJ SOLN
INTRAMUSCULAR | Status: AC
  Filled 2014-08-14: qty 1000

## 2014-08-14 MED ORDER — ACETAMINOPHEN 325 MG PO TABS: 650.0000 mg | ORAL_TABLET | ORAL | Status: DC | PRN

## 2014-08-14 MED ORDER — FENTANYL CITRATE 0.05 MG/ML IJ SOLN
INTRAMUSCULAR | Status: AC
  Filled 2014-08-14: qty 2

## 2014-08-14 MED ORDER — ASPIRIN 81 MG PO CHEW
81.0000 mg | CHEWABLE_TABLET | ORAL | Status: AC
  Administered 2014-08-14: 81 mg via ORAL

## 2014-08-14 MED ORDER — INSULIN ASPART 100 UNIT/ML ~~LOC~~ SOLN
0.0000 [IU] | Freq: Three times a day (TID) | SUBCUTANEOUS | Status: DC
  Administered 2014-08-14: 17:00:00 2 [IU] via SUBCUTANEOUS

## 2014-08-14 MED ORDER — DIAZEPAM 5 MG PO TABS: 5.0000 mg | ORAL_TABLET | ORAL | Status: DC | PRN

## 2014-08-14 MED ORDER — LISINOPRIL 10 MG PO TABS
10.0000 mg | ORAL_TABLET | Freq: Every day | ORAL | Status: DC
  Administered 2014-08-15: 10 mg via ORAL
  Filled 2014-08-14: qty 1

## 2014-08-14 MED ORDER — SODIUM CHLORIDE 0.9 % IV SOLN
1.0000 mL/kg/h | INTRAVENOUS | Status: AC
  Administered 2014-08-14: 1 mL/kg/h via INTRAVENOUS

## 2014-08-14 MED ORDER — MIDAZOLAM HCL 2 MG/2ML IJ SOLN
INTRAMUSCULAR | Status: AC
  Filled 2014-08-14: qty 2

## 2014-08-14 MED ORDER — NITROGLYCERIN 1 MG/10 ML FOR IR/CATH LAB
INTRA_ARTERIAL | Status: AC
  Filled 2014-08-14: qty 10

## 2014-08-14 MED ORDER — ATORVASTATIN CALCIUM 80 MG PO TABS
80.0000 mg | ORAL_TABLET | Freq: Every day | ORAL | Status: DC
  Administered 2014-08-14: 80 mg via ORAL
  Filled 2014-08-14 (×2): qty 1

## 2014-08-14 MED ORDER — NITROGLYCERIN 0.4 MG SL SUBL
0.4000 mg | SUBLINGUAL_TABLET | SUBLINGUAL | Status: DC | PRN
  Administered 2014-08-14 (×2): 0.4 mg via SUBLINGUAL
  Filled 2014-08-14: qty 1

## 2014-08-14 MED ORDER — SODIUM CHLORIDE 0.9 % IV SOLN
INTRAVENOUS | Status: DC
  Administered 2014-08-14: 11:00:00 via INTRAVENOUS

## 2014-08-14 MED ORDER — OXYCODONE-ACETAMINOPHEN 5-325 MG PO TABS: 1.0000 | ORAL_TABLET | ORAL | Status: DC | PRN

## 2014-08-14 MED ORDER — AMLODIPINE BESYLATE 5 MG PO TABS
5.0000 mg | ORAL_TABLET | Freq: Every day | ORAL | Status: DC
  Administered 2014-08-15: 11:00:00 5 mg via ORAL
  Filled 2014-08-14: qty 1

## 2014-08-14 MED ORDER — HEPARIN SODIUM (PORCINE) 1000 UNIT/ML IJ SOLN
INTRAMUSCULAR | Status: AC
  Filled 2014-08-14: qty 1

## 2014-08-14 MED ORDER — SODIUM CHLORIDE 0.9 % IJ SOLN: 3.0000 mL | Freq: Two times a day (BID) | INTRAMUSCULAR | Status: DC

## 2014-08-14 MED ORDER — ONDANSETRON HCL 4 MG/2ML IJ SOLN: 4.0000 mg | Freq: Four times a day (QID) | INTRAMUSCULAR | Status: DC | PRN

## 2014-08-14 MED ORDER — ASPIRIN 81 MG PO TABS: 81.0000 mg | ORAL_TABLET | Freq: Every day | ORAL | Status: DC

## 2014-08-14 MED ORDER — ASPIRIN 81 MG PO CHEW
CHEWABLE_TABLET | ORAL | Status: AC
  Administered 2014-08-14: 81 mg via ORAL
  Filled 2014-08-14: qty 1

## 2014-08-14 MED ORDER — VERAPAMIL HCL 2.5 MG/ML IV SOLN
INTRAVENOUS | Status: AC
  Filled 2014-08-14: qty 2

## 2014-08-14 MED ORDER — ASPIRIN EC 81 MG PO TBEC
81.0000 mg | DELAYED_RELEASE_TABLET | Freq: Every day | ORAL | Status: DC
  Administered 2014-08-15: 11:00:00 81 mg via ORAL
  Filled 2014-08-14: qty 1

## 2014-08-14 MED ORDER — PRASUGREL HCL 10 MG PO TABS
10.0000 mg | ORAL_TABLET | Freq: Every day | ORAL | Status: DC
  Administered 2014-08-15: 10 mg via ORAL
  Filled 2014-08-14: qty 1

## 2014-08-14 NOTE — CV Procedure (Signed)
Cardiac Catheterization Procedure Note  Name: Peter [name] MRN: [id number] DOB: 18-Feb-1941  Procedure: Left Heart Cath, Selective Coronary Angiography, LV angiography, PTCA and stenting of the LIMA-LAD insertion site (mid-LAD)  Indication: CCS Class 3 angina, crescendo symptoms. 73 year-old male with known CAD s/p remote CABG and multiple PCI procedures.  Procedural Details:  The left wrist was prepped, draped, and anesthetized with 1% lidocaine. Using the modified Seldinger technique, a 5/6 French Slender sheath was introduced into the right radial artery. 3 mg of verapamil was administered through the sheath, weight-based unfractionated heparin was administered intravenously. Standard Judkins catheters were used for selective coronary angiography and left ventriculography. An AL-1 catheter was used for saphenous vein graft angiography. A multipurpose catheter was used to image the saphenous vein graft to RCA. The LIMA was very difficult to selectively engage. Ultimately, I had to wire the LIMA with a cougar guidewire in order to engage a 5 [place] guide catheter to take better images. Catheter exchanges were performed over an exchange length guidewire.  PROCEDURAL FINDINGS Hemodynamics: AO 152/77 with a mean of 109 LV 156/18   Coronary angiography: Coronary dominance: right  Left mainstem: The left main is calcified and diffusely diseased with scattered 30% stenosis.  Left anterior descending (LAD): The LAD is severely calcified. The proximal vessel is totally occluded at the level of the first septal perforator. There is very severe calcification visualized of the mid LAD.  Left circumflex (LCx): The left circumflex is calcified. The vessel is totally occluded in its proximal portion. The first OM branch is visualized and is critically diseased and very small throughout with 99% stenosis and it visually appears to be less than 1 mm in diameter  Right coronary artery (RCA): 100%  occlusion proximally  Saphenous vein sequential to OM 1 and 2. The vein graft is patent throughout. It is diffusely irregular without any areas of high-grade stenosis. The distal native coronary vessels are patent but very small in caliber.  Saphenous vein graft sequential to diagonal 1 ramus intermedius: This graft is also patent. There is 20-30% stenosis in the proximal body of the graft. The diagonal branch is tiny. The intermediate branch is patent the anastomotic site, but there is severe stenosis where it bifurcates into 2 very small subbranches with 90% stenosis at each branch origin. These branches are not amenable to PCI because of their small caliber  Saphenous vein graft to PDA and PLA: Total occlusion in the proximal body the graft within the stented segment  LIMA to LAD: The LIMA graft is widely patent throughout. The graft is moderately tortuous. The LAD retrograde fills back through its midportion. 2 diagonals fill from this graft. Distal to the graft insertion site, there is 90% stenosis just beyond the graft insertion site with mild diffuse disease leading into a stented segment that covers the mid LAD and to the apical portion of the LAD. The stent is small in caliber that appears patent throughout with only mild diffuse in-stent restenosis.  Left ventriculography: The anterolateral and apical LV wall segments contract vigorously. There is akinesis of the inferior wall from base to near the apex. The LVEF is estimated at 45%. There is no significant mitral regurgitation appreciated.  PCI Note:  Following the diagnostic procedure, the decision was made to proceed with PCI of the native LAD through the LIMA graft near the graft insertion site.  Weight-based heparin was given for anticoagulation. Once a therapeutic ACT (270) was achieved, a 5 [place] LIMA  guide catheter was inserted.  A cougar coronary guidewire was used to cross the lesion.  The lesion was predilated with a 2.0 x 15 mm  balloon.  The lesion was then stented with a 2.25x18 mm Xience DES.  The stent was postdilated with a 2.5 mm noncompliant balloon.  Following PCI, there was 0% residual stenosis and TIMI-3 flow. Final angiography confirmed an excellent result. The patient tolerated the procedure well. There were no immediate procedural complications. A TR band was used for radial hemostasis. The patient was transferred to the post catheterization recovery area for further monitoring.  PCI Data: Vessel - LAD/Segment - mid (LIMA insertion site) Percent Stenosis (pre)  90 TIMI-flow 3 Stent 2.25 x 18 mm Xience DES Percent Stenosis (post) 0 TIMI-flow (post) 3  Contrast: 120 cc Ominipaque  Radiation dose/Fluoro time: 20.8 min  Estimated Blood Loss: minimal  Final Conclusions:   1. Severe native three-vessel coronary artery disease with total occlusion of the LAD, total occlusion left circumflex, and total occlusion of the RCA  2. Status post aortocoronary bypass surgery with continued patency of the saphenous vein graft sequential to OM1 and OM 2, saphenous vein graft sequential to ramus intermedius and first diagonal, and LIMA to LAD  3. Total occlusion of the saphenous vein graft to PDA and PLA  4. Severe stenosis of the mid LAD at the LIMA insertion site treated successfully with PCI using a single drug-eluting stent  5. Mild to moderate segmental LV systolic dysfunction with inferior wall akinesis and an estimated LVEF of 45%   Recommendations:  The patient is Plavix allergic. Recommend treatment with aspirin and effient for 12 months.  Sherren Mocha MD, [facility] 08/14/2014, 3:47 PM

## 2014-08-14 NOTE — Progress Notes (Signed)
Pt c/o chest pressure at 19:45 with pain score of 5/10. V/S stable. Ekg done without changes. Ntg SL x 2 doses given with relief. Dr. Tommi Rumps informed.

## 2014-08-14 NOTE — H&P (View-Only) (Signed)
Clinical Summary Mr. Beltran is a 73 y.o.male seen today as a new patient for the following medical problems.  1. CAD - hx of prior CABG 1997 at [facility]  - stent to SVG-PDA and PTCA of the LAD in 05/2002. Normal LVEF at that time - 2008 heart cath [facility] had repeat stenting, he is unsure of the details - episodes of chest pain starting 2 weeks ago. Described as burning in midchest, 3/10. Typically comes on with exertion, no SOB. Does not occur at rest. Symptoms last just a few minutes, resolves with rest.  - no LE edema, no orthopnea.  - compliant with meds - seen in ER 07/2014, chronic TWI lateral precoridal leads old. Trop neg.   2. AAA - stable by [place] 02/2014 at 3.6 cm   3. HTN - compliant with meds  4. Hyperlipidemia - he stopped statin on his own, no particular reason. - 08/2014 TC 183 TG 115 HDL 36 LDL 124    Past Medical History  Diagnosis Date  . Myocardial infarction 1997    stents x2 (2003/2007)  . Diabetes mellitus     diet controlled  . AAA (abdominal aortic aneurysm)     3.6 X 3.4cm on u/s 06/2012  . Hypertension   . CAD (coronary artery disease)   . Dyslipidemia      Allergies  Allergen Reactions  . Dye Fdc Red [Red Dye]     hives  . Ivp Dye [Iodinated Diagnostic Agents] Hives     Current Outpatient Prescriptions  Medication Sig Dispense Refill  . aspirin 81 MG tablet Take 81 mg by mouth as needed (lower blood pressure).       Marland Kitchen atenolol (TENORMIN) 100 MG tablet Take 1 tablet (100 mg total) by mouth daily.  90 tablet  1  . lisinopril (PRINIVIL,ZESTRIL) 10 MG tablet Take 1 tablet (10 mg total) by mouth daily.  90 tablet  1  . metFORMIN (GLUCOPHAGE) 500 MG tablet Take 250 mg by mouth 2 (two) times daily with a meal.      . nitroGLYCERIN (NITROSTAT) 0.4 MG SL tablet Place 1 tablet (0.4 mg total) under the tongue every 5 (five) minutes as needed for chest pain.  30 tablet  0   No current facility-administered medications for this visit.      Past Surgical History  Procedure Laterality Date  . Cystectomy  2005    top of head-Jenkins  . Coronary angioplasty  2003/2007    2 stents  . 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 Ear cyst excision  07/09/2012    Procedure: CYST REMOVAL;  Surgeon: Jamesetta So, MD;  Location: [facility];  Service: General;  Laterality: N/A;     Allergies  Allergen Reactions  . Dye Fdc Red [Red Dye]     hives  . Ivp Dye [Iodinated Diagnostic Agents] Hives      Family History  Problem Relation Age of Onset  . Other Father     deceased after ?TCS or barium enema, age 69s  . Hypertension Father   . Colon cancer Neg Hx   . Liver disease Neg Hx   . Heart attack Mother      Social History Mr. Jared reports that he quit smoking about 14 years ago. His smoking use included Cigarettes. He has a 120 pack-year smoking history. He does not have any smokeless tobacco history on file. Mr. Boulay reports that he does  not drink alcohol.   Review of Systems CONSTITUTIONAL: No weight loss, fever, chills, weakness or fatigue.  HEENT: Eyes: No visual loss, blurred vision, double vision or yellow sclerae.No hearing loss, sneezing, congestion, runny nose or sore throat.  SKIN: No rash or itching.  CARDIOVASCULAR: per HPI RESPIRATORY: No shortness of breath, cough or sputum.  GASTROINTESTINAL: No anorexia, nausea, vomiting or diarrhea. No abdominal pain or blood.  GENITOURINARY: No burning on urination, no polyuria NEUROLOGICAL: No headache, dizziness, syncope, paralysis, ataxia, numbness or tingling in the extremities. No change in bowel or bladder control.  MUSCULOSKELETAL: No muscle, back pain, joint pain or stiffness.  LYMPHATICS: No enlarged nodes. No history of splenectomy.  PSYCHIATRIC: No history of depression or anxiety.  ENDOCRINOLOGIC: No reports of sweating, cold or heat intolerance. No polyuria or polydipsia.  Marland Kitchen   Physical Examination p 60 bp 142/98 Wt 194 lbs BMI  27 Gen: resting comfortably, no acute distress HEENT: no scleral icterus, pupils equal round and reactive, no palptable cervical adenopathy,  CV: RRR, no m/r/g, no JVD, no carotid bruits Resp: Clear to auscultation bilaterally GI: abdomen is soft, non-tender, non-distended, normal bowel sounds, no hepatosplenomegaly MSK: extremities are warm, no edema.  Skin: warm, no rash Neuro:  no focal deficits Psych: appropriate affect   Diagnostic Studies     Assessment and Plan  1. CAD - patient with new onset exertional chest pain starting approx 2 weeks ago - mild, stable symptoms since onset. We discussed non-invasive testing for further risk stratification, however he is very concerned given his past history and past presentation and would prefer to undergo a definitive diagnostic cath - will add norvasc as additional antianginal - will arrange LHC with coronary angio at [facility] - history of dye allergy, our nurses have arranged the appropriate protocol  2. AAA - continue routine surveillance, stable by last [place]  3. HTN - follow after adding norvac  4. Hyperlipidemia - change to high dose statin in setting CAD     Arnoldo Lenis, M.D., F.A.C.C.

## 2014-08-14 NOTE — Interval H&P Note (Signed)
History and Physical Interval Note:  08/14/2014 2:24 PM  Peter [name]  has presented today for surgery, with the diagnosis of cp  The various methods of treatment have been discussed with the patient and family. After consideration of risks, benefits and other options for treatment, the patient has consented to  Procedure(s): LEFT HEART CATHETERIZATION WITH CORONARY/GRAFT ANGIOGRAM (N/A) as a surgical intervention .  The patient's history has been reviewed, patient examined, no change in status, stable for surgery.  I have reviewed the patient's chart and labs.  Questions were answered to the patient's satisfaction.    Cath Lab Visit (complete for each Cath Lab visit)  Clinical Evaluation Leading to the Procedure:   ACS: No.  Non-ACS:    Anginal Classification: CCS III  Anti-ischemic medical therapy: Minimal Therapy (1 class of medications)  Non-Invasive Test Results: No non-invasive testing performed  Prior CABG: Previous CABG       Sherren Mocha

## 2014-08-15 ENCOUNTER — Encounter (HOSPITAL_COMMUNITY): Payer: Self-pay | Admitting: Cardiology

## 2014-08-15 ENCOUNTER — Ambulatory Visit (HOSPITAL_COMMUNITY): Payer: Medicare Other

## 2014-08-15 DIAGNOSIS — Z955 Presence of coronary angioplasty implant and graft: Secondary | ICD-10-CM

## 2014-08-15 DIAGNOSIS — I25810 Atherosclerosis of coronary artery bypass graft(s) without angina pectoris: Secondary | ICD-10-CM | POA: Diagnosis not present

## 2014-08-15 DIAGNOSIS — I25709 Atherosclerosis of coronary artery bypass graft(s), unspecified, with unspecified angina pectoris: Secondary | ICD-10-CM | POA: Diagnosis present

## 2014-08-15 DIAGNOSIS — R0789 Other chest pain: Secondary | ICD-10-CM

## 2014-08-15 DIAGNOSIS — T50995A Adverse effect of other drugs, medicaments and biological substances, initial encounter: Secondary | ICD-10-CM | POA: Diagnosis present

## 2014-08-15 DIAGNOSIS — E119 Type 2 diabetes mellitus without complications: Secondary | ICD-10-CM

## 2014-08-15 DIAGNOSIS — I200 Unstable angina: Secondary | ICD-10-CM | POA: Diagnosis not present

## 2014-08-15 DIAGNOSIS — I2510 Atherosclerotic heart disease of native coronary artery without angina pectoris: Secondary | ICD-10-CM | POA: Diagnosis not present

## 2014-08-15 DIAGNOSIS — Z889 Allergy status to unspecified drugs, medicaments and biological substances status: Secondary | ICD-10-CM

## 2014-08-15 DIAGNOSIS — I209 Angina pectoris, unspecified: Secondary | ICD-10-CM

## 2014-08-15 DIAGNOSIS — J449 Chronic obstructive pulmonary disease, unspecified: Secondary | ICD-10-CM | POA: Diagnosis present

## 2014-08-15 HISTORY — PX: CORONARY STENT PLACEMENT: SHX1402

## 2014-08-15 LAB — BASIC METABOLIC PANEL
Anion gap: 12 (ref 5–15)
BUN: 18 mg/dL (ref 6–23)
CO2: 23 mEq/L (ref 19–32)
Calcium: 8.3 mg/dL — ABNORMAL LOW (ref 8.4–10.5)
Chloride: 104 mEq/L (ref 96–112)
Creatinine, Ser: 1.15 mg/dL (ref 0.50–1.35)
GFR calc Af Amer: 72 mL/min — ABNORMAL LOW (ref >90)
GFR calc non Af Amer: 62 mL/min — ABNORMAL LOW (ref >90)
Glucose, Bld: 185 mg/dL — ABNORMAL HIGH (ref 70–99)
Potassium: 3.8 mEq/L (ref 3.7–5.3)
Sodium: 139 mEq/L (ref 137–147)

## 2014-08-15 LAB — CBC
HCT: 36.7 % — ABNORMAL LOW (ref 39.0–52.0)
Hemoglobin: 13 g/dL (ref 13.0–17.0)
MCH: 34.1 pg — ABNORMAL HIGH (ref 26.0–34.0)
MCHC: 35.4 g/dL (ref 30.0–36.0)
MCV: 96.3 fL (ref 78.0–100.0)
Platelets: 100 10*3/uL — ABNORMAL LOW (ref 150–400)
RBC: 3.81 MIL/uL — ABNORMAL LOW (ref 4.22–5.81)
RDW: 13.1 % (ref 11.5–15.5)
WBC: 10.7 10*3/uL — ABNORMAL HIGH (ref 4.0–10.5)

## 2014-08-15 LAB — GLUCOSE, CAPILLARY: Glucose-Capillary: 128 mg/dL — ABNORMAL HIGH (ref 70–99)

## 2014-08-15 MED ORDER — PRASUGREL HCL 10 MG PO TABS: 10.0000 mg | ORAL_TABLET | Freq: Every day | ORAL | Status: DC

## 2014-08-15 MED ORDER — ACETAMINOPHEN 325 MG PO TABS: 650.0000 mg | ORAL_TABLET | ORAL | Status: DC | PRN

## 2014-08-15 MED ORDER — METFORMIN HCL 500 MG PO TABS: 250.0000 mg | ORAL_TABLET | Freq: Two times a day (BID) | ORAL | Status: DC

## 2014-08-15 NOTE — Progress Notes (Signed)
Received referral from inpatient RNCM for [facility] services. Came to speak with patient at bedside to explain and offer services. Patient endorses he has issues with affording his medications at times and has been under a lot of stress with his wife being in SNF. Will make appropriate referrals to [facility] team. Consents obtained. He will receive post hospital discharge call and will be evaluated for monthly home visits. Also made referral to [facility] as well.  Left [facility] Care Management packet and contact information at bedside. Appreciative of visit.  Marthenia Rolling, MSN- [facility] [phone]

## 2014-08-15 NOTE — Discharge Summary (Signed)
Peter [name] ambulated without difficulty. He has had no chest discomfort. The radial cath site is unremarkable. He has undergone teaching with reference to antiplatelet therapy. He is scheduled for followup. The above discharge summary and plan is correct and was under my direction. The patient is being discharged in improved condition

## 2014-08-15 NOTE — Progress Notes (Signed)
    Subjective:  No chest pain  Objective:  Vital Signs in the last 24 hours: Temp:  [97.3 F (36.3 C)-98.5 F (36.9 C)] 98.3 F (36.8 C) (09/11 0748) Pulse Rate:  [61-75] 61 (09/11 0748) Resp:  [17-20] 18 (09/11 0748) BP: (136-179)/(60-97) 169/79 mmHg (09/11 0748) SpO2:  [92 %-98 %] 95 % (09/11 0748) Weight:  [190 lb (86.183 kg)-193 lb 5.5 oz (87.7 kg)] 193 lb 5.5 oz (87.7 kg) (09/11 0009)  Intake/Output from previous day:  Intake/Output Summary (Last 24 hours) at 08/15/14 0820 Last data filed at 08/15/14 0748  Gross per 24 hour  Intake 1163.4 ml  Output    600 ml  Net  563.4 ml    Physical Exam: General appearance: alert, cooperative and no distress Lungs: clear to auscultation bilaterally Heart: regular rate and rhythm Extremities: Rt radial site without hematoma   Rate: 66  Rhythm: normal sinus rhythm  Lab Results:  Recent Labs  08/14/14 1108 08/15/14 0400  WBC 12.4* 10.7*  HGB 14.5 13.0  PLT 137* 100*    Recent Labs  08/15/14 0400  NA 139  K 3.8  CL 104  CO2 23  GLUCOSE 185*  BUN 18  CREATININE 1.15   No results found for this basename: TROPONINI, CK, MB,  in the last 72 hours  Recent Labs  08/14/14 1108  INR 1.12    Imaging: No results found.  Cardiac Studies:  Assessment/Plan:  73 y.o.male with a history of CABG in '97 with subsequent PCI's '03, and '08. He saw Dr Harl Bowie in [place] with complaints of chest pain c/w angina. It was decided to proceed directly to cath which was done 08/14/14. This revealed a 90% stenosis just beyond the graft insertion site of the LIMA-LAD. This was intervened on with a DES.     Principal Problem:   Exertional angina Active Problems:    PCI- LIMA-LAD insertion site DES 08/14/14   Type II diabetes mellitus, well controlled   HTN (hypertension), benign   CAD-CABG '97- multiple PCIs since   Dyslipidemia   AAA (abdominal aortic aneurysm) without rupture    PLAN: Check CXR before discharge- long  time smoker, last CXR 2003. ASA and Effient as he has a Plavix allergy. F/U Dr Harl Bowie.  Kerin Ransom PA-C [facility] [phone] 08/15/2014, 8:20 AM

## 2014-08-15 NOTE — Discharge Summary (Signed)
Patient ID: Peter [name],  MRN: [id number], DOB/AGE: [birth year] 73 y.o.  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 He saw Dr Harl Bowie in [place] with complaints of chest pain c/w angina. It was decided to proceed directly to cath which was done 08/14/14. The pt has a contrast allergy and he was premedicated with steroids and H2 blocker. This revealed a 90% stenosis just beyond the graft insertion site of the LIMA-LAD. This was intervened on with a DES. He tolerated the procedure well and was seen by Dr Tamala Julian 08/15/14 and felt to be stable for discharge. He also has an allergy to Plavix so he was discharged on Effient.    Discharge Vitals:  Blood pressure 169/79, pulse 61, temperature 98.3 F (36.8 C), temperature source Oral, resp. rate 18, height 6' (1.829 m), weight 193 lb 5.5 oz (87.7 kg), SpO2 95.00%.    Labs: Results for orders placed during the hospital encounter of 08/14/14 (from the past 24 hour(s))  PROTIME-INR     Status: None   Collection Time    08/14/14 11:08 AM      Result Value Ref Range   Prothrombin Time 14.4  11.6 - 15.2 seconds   INR 1.12  0.00 - 1.49  CBC     Status: Abnormal   Collection Time    08/14/14 11:08 AM      Result Value Ref Range   WBC 12.4 (*) 4.0 - 10.5 K/uL   RBC 4.35  4.22 - 5.81 MIL/uL   Hemoglobin 14.5  13.0 -  17.0 g/dL   HCT 40.4  39.0 - 52.0 %   MCV 92.9  78.0 - 100.0 fL   MCH 33.3  26.0 - 34.0 pg   MCHC 35.9  30.0 - 36.0 g/dL   RDW 12.6  11.5 - 15.5 %   Platelets 137 (*) 150 - 400 K/uL  POCT ACTIVATED CLOTTING TIME     Status: None   Collection Time    08/14/14  3:07 PM      Result Value Ref Range   Activated Clotting Time 202    POCT ACTIVATED CLOTTING TIME     Status: None   Collection Time    08/14/14  3:22 PM      Result Value Ref Range   Activated Clotting Time 270    GLUCOSE, CAPILLARY     Status: Abnormal   Collection Time    08/14/14  4:15 PM      Result Value Ref Range   Glucose-Capillary 140 (*) 70 - 99 mg/dL   Comment 1 Documented in Chart    GLUCOSE, CAPILLARY     Status: Abnormal   Collection Time    08/14/14  8:57 PM      Result Value Ref Range   Glucose-Capillary 210 (*) 70 - 99 mg/dL   Comment 1  Notify RN    BASIC METABOLIC PANEL     Status: Abnormal   Collection Time    08/15/14  4:00 AM      Result Value Ref Range   Sodium 139  137 - 147 mEq/L   Potassium 3.8  3.7 - 5.3 mEq/L   Chloride 104  96 - 112 mEq/L   CO2 23  19 - 32 mEq/L   Glucose, Bld 185 (*) 70 - 99 mg/dL   BUN 18  6 - 23 mg/dL   Creatinine, Ser 1.15  0.50 - 1.35 mg/dL   Calcium 8.3 (*) 8.4 - 10.5 mg/dL   GFR calc non Af Amer 62 (*) >90 mL/min   GFR calc Af Amer 72 (*) >90 mL/min   Anion gap 12  5 - 15  CBC     Status: Abnormal   Collection Time    08/15/14  4:00 AM      Result Value Ref Range   WBC 10.7 (*) 4.0 - 10.5 K/uL   RBC 3.81 (*) 4.22 - 5.81 MIL/uL   Hemoglobin 13.0  13.0 - 17.0 g/dL   HCT 36.7 (*) 39.0 - 52.0 %   MCV 96.3  78.0 - 100.0 fL   MCH 34.1 (*) 26.0 - 34.0 pg   MCHC 35.4  30.0 - 36.0 g/dL   RDW 13.1  11.5 - 15.5 %   Platelets 100 (*) 150 - 400 K/uL  GLUCOSE, CAPILLARY     Status: Abnormal   Collection Time    08/15/14  6:26 AM      Result Value Ref Range   Glucose-Capillary 128 (*) 70 - 99 mg/dL   Comment 1 Notify RN      Disposition:      Follow-up  Information   Follow up with Carlyle Dolly, F, MD. (Office will call you)    Specialty:  Cardiology   Contact information:   [address] [place] [address] [phone]       Discharge Medications:    Medication List    STOP taking these medications       predniSONE 20 MG tablet  Commonly known as:  DELTASONE     ranitidine 150 MG capsule  Commonly known as:  ZANTAC      TAKE these medications       acetaminophen 325 MG tablet  Commonly known as:  TYLENOL  Take 2 tablets (650 mg total) by mouth every 4 (four) hours as needed for headache or mild pain.     amLODipine 5 MG tablet  Commonly known as:  NORVASC  Take 1 tablet (5 mg total) by mouth daily.     aspirin 81 MG tablet  Take 81 mg by mouth daily.     atenolol 100 MG tablet  Commonly known as:  TENORMIN  Take 1 tablet (100 mg total) by mouth daily.     atorvastatin 80 MG tablet  Commonly known as:  LIPITOR  Take 1 tablet (80 mg total) by mouth daily.     diphenhydrAMINE 25 MG tablet  Commonly known as:  SOMINEX  Take 25 mg by mouth See admin instructions. Take 25mg  at 6 pm the night before your cath and again take 25 mg at 6 am the morning of your cath     ibuprofen 200 MG tablet  Commonly known as:  ADVIL,MOTRIN  Take 600 mg by mouth daily as needed (pain).     lisinopril 10 MG tablet  Commonly known  as:  PRINIVIL,ZESTRIL  Take 1 tablet (10 mg total) by mouth daily.     metFORMIN 500 MG tablet  Commonly known as:  GLUCOPHAGE  Take 0.5 tablets (250 mg total) by mouth 2 (two) times daily with a meal.  Start taking on:  08/17/2014     nitroGLYCERIN 0.4 MG SL tablet  Commonly known as:  NITROSTAT  Place 1 tablet (0.4 mg total) under the tongue every 5 (five) minutes as needed for chest pain.     prasugrel 10 MG Tabs tablet  Commonly known as:  EFFIENT  Take 1 tablet (10 mg total) by mouth daily.         Duration of Discharge Encounter: Greater than 30 minutes including physician  time.  Angelena Form PA-C 08/15/2014 9:43 AM

## 2014-08-15 NOTE — Progress Notes (Signed)
CARDIAC REHAB PHASE I   PRE:  Rate/Rhythm: 65 SR  BP:  Supine: 169/79  Sitting:   Standing:    SaO2: 93%RA  MODE:  Ambulation: 450 ft   POST:  Rate/Rhythm: 80 SR  BP:  Supine:   Sitting: 178/75  Standing:    SaO2: 94%RA 0805-0855 Pt walked 450 ft on RA with steady gait. No CP. Education completed with pt who voiced understanding. Has attended CRP 2 before and does not want to repeat at this time. Gave effient booklet. Reviewed NTG use. Gave diabetic diet and discussed some healthy tips. Pt under a lot of stress with wife in SNF.   Graylon Good, RN BSN  08/15/2014 8:53 AM

## 2014-08-15 NOTE — Progress Notes (Signed)
Patient is doing well post procedure. He's had no recurrence of chest pain. If he ambulates well I believe he would be eligible for discharge later this morning.

## 2014-08-15 NOTE — Discharge Instructions (Signed)
DO NOT TAKE METFORMIN AGAIN UNTIL Sunday SEPT13 Coronary Angiogram with Stent Coronary angiography with stent placement is a procedure to widen or open a narrow blood vessel of the heart (coronary artery). When a coronary artery becomes partially blocked, it decreases blood flow to that area. This may lead to chest pain or a heart attack (myocardial infarction). Arteries may become blocked by cholesterol buildup (plaque) in the lining or wall.  A stent is a small piece of metal that looks like a mesh or a spring. Stent placement may be done right after a coronary angiography in which a blocked artery is found or as a treatment for a heart attack.  LET [facility] CARE PROVIDER KNOW ABOUT:  Any allergies you have.   All medicines you are taking, including vitamins, herbs, eye drops, creams, and over-the-counter medicines.   Previous problems you or members of your family have had with the use of anesthetics.   Any blood disorders you have.   Previous surgeries you have had.   Medical conditions you have. RISKS AND COMPLICATIONS Generally, coronary angiography with stent is a safe procedure. However, problems can occur and include:  Damage to the heart or its blood vessels.   A return of blockage.   Bleeding, infection, or bruising at the insertion site.   A collection of blood under the skin (hematoma) at the insertion site.  Blood clot in another part of the body.   Kidney injury.   Allergic reaction to the dye or contrast used.   Bleeding into the abdomen (retroperitoneal bleeding). BEFORE THE PROCEDURE  Do not eat or drink anything after midnight on the night before the procedure or as directed by your health care provider.  Ask your health care provider about changing or stopping your regular medicines. This is especially important if you are taking diabetes medicines or blood thinners.  Your health care provider will make sure you understand the procedure as  well as the risks and potential problems associated with the procedure.  PROCEDURE  You may be given a medicine to help you relax before and during the procedure (sedative). This medicine will be given through an IV tube that is put into one of your veins.   The area where the catheter will be inserted will be shaved and cleaned. This is usually done in the groin but may be done in the fold of your arm (near your elbow) or in the wrist.   A medicine will be given to numb the area where the catheter will be inserted (local anesthetic).   The catheter will be inserted into an artery using a guide wire. A type of X-ray (fluoroscopy) will be used to help guide the catheter to the opening of the blocked artery.   A dye will then be injected into the catheter, and X-rays will be taken. The dye will help to show where any narrowing or blockages are located in the heart arteries.   A tiny wire will be guided to the blocked spot, and a balloon will be inflated to make the artery wider. The stent will be expanded and will crush the plaque into the wall of the vessel. The stent will hold the area open like a scaffolding and improve the blood flow.   Sometimes the artery may be made wider using a laser or other tools to remove plaque.   When the blood flow is better, the catheter will be removed. The lining of the artery will grow over the  stent, which stays where it was placed.  AFTER THE PROCEDURE  If the procedure is done through the leg, you will be kept in bed lying flat for about 6 hours. You will be instructed to not bend or cross your legs.   The insertion site will be checked frequently.   The pulse in your feet or wrist will be checked frequently.   Additional blood tests, X-rays, and electrocardiography may be done. Document Released: 05/28/2003 Document Revised: 04/07/2014 Document Reviewed: 05/30/2013 [facility] Patient Information 2015 [place], [place]. This information is not  intended to replace advice given to you by your health care provider. Make sure you discuss any questions you have with your health care provider.

## 2014-08-15 NOTE — Care Management Note (Signed)
    Page 1 of 1   08/15/2014     12:05:39 PM CARE MANAGEMENT NOTE 08/15/2014  Patient:  Peter [name], Peter [name]   Account Number:  [id number]  Date Initiated:  08/15/2014  Documentation initiated by:  [facility]  Subjective/Objective Assessment:   Indication: CCS Class 3 angina, crescendo symptoms. 73 year-old male with known CAD s/p remote CABG and multiple PCI procedures.//Home alone in [place], [place]     Action/Plan:   Procedure: Left Heart Cath, Selective Coronary Angiography, LV angiography, PTCA and stenting of the LIMA-LAD insertion site (mid-LAD).//Benefits check   Anticipated DC Date:  08/15/2014   Anticipated DC Plan:  [place]  CM consult      Choice offered to / List presented to:             Status of service:  Completed, signed off Medicare Important Message given?   (If response is "NO", the following Medicare IM given date fields will be blank) Date Medicare IM given:   Medicare IM given by:   Date Additional Medicare IM given:   Additional Medicare IM given by:    Discharge Disposition:    Per UR Regulation:    If discussed at Long Length of Stay Meetings, dates discussed:    Comments:  08/15/14 1015 Shonteria Abeln J. Clydene Laming, RN, BSN, [employer] [phone] Spoke with pt at bedside regarding benefits check for Effient.  Pt has brochure with 30 day free card and refill assistance card intact.  Pt utilizes [employer] in [place] for prescription needs. Per rep at [facility], patient has NO part D coverage for prescriptions.   NCM called pharmacy to confirm availability of medication. Information relayed to pt.  Pt verbalizes importance of filling medication upon discharge.  NCM consulted [facility] for pharmacy and community resourses for Pt after discharge.  Marthenia Rolling, RN to see pt prior to discharge to talk about enrollment opportunities.

## 2014-08-28 ENCOUNTER — Ambulatory Visit: Payer: Medicare Other | Admitting: Family Medicine

## 2014-08-29 NOTE — Progress Notes (Signed)
HPI: [name] is a 73 year old patient of Dr. Kelby Fam we are following his hospitalization with known history of CAD, history of CABG in 1997, subsequent PCI in 2003, and 2008. The patient was seen by Dr. branch in our office on 08/14/2014 with complaints very worrisome for angina. He was subsequently transferred to [facility], where cardiac catheterization was completed.  Cardiac catheterization demonstrating 90 percent stenosis just beyond the graft insertion site of the LIMA to LAD, this is intervened with a drug-eluting stent. The patient was discharged on Effient. He was continued on amlodipine, aspirin, atenolol, statin, and is here for post hospitalization followup.  He comes today feeling well but is concerned about the cost of Effient. He is on a limited income and would like to have assistance on the medication. He is without complaints of chest pain.     Allergies  Allergen Reactions  . Dye Fdc Red [Red Dye]     hives  . Ivp Dye [Iodinated Diagnostic Agents] Hives  . Plavix [Clopidogrel] Hives    Unsure if dye or Plavix    Current Outpatient Prescriptions  Medication Sig Dispense Refill  . acetaminophen (TYLENOL) 325 MG tablet Take 2 tablets (650 mg total) by mouth every 4 (four) hours as needed for headache or mild pain.      Marland Kitchen amLODipine (NORVASC) 5 MG tablet Take 1 tablet (5 mg total) by mouth daily.  90 tablet  3  . aspirin 81 MG tablet Take 81 mg by mouth daily.       Marland Kitchen atenolol (TENORMIN) 100 MG tablet Take 1 tablet (100 mg total) by mouth daily.  90 tablet  1  . atorvastatin (LIPITOR) 80 MG tablet Take 1 tablet (80 mg total) by mouth daily.  90 tablet  3  . ibuprofen (ADVIL,MOTRIN) 200 MG tablet Take 600 mg by mouth daily as needed (pain).      Marland Kitchen lisinopril (PRINIVIL,ZESTRIL) 10 MG tablet Take 1 tablet (10 mg total) by mouth daily.  90 tablet  1  . metFORMIN (GLUCOPHAGE) 500 MG tablet Take 0.5 tablets (250 mg total) by mouth 2 (two) times daily with a  meal.      . nitroGLYCERIN (NITROSTAT) 0.4 MG SL tablet Place 1 tablet (0.4 mg total) under the tongue every 5 (five) minutes as needed for chest pain.  30 tablet  0  . prasugrel (EFFIENT) 10 MG TABS tablet Take 1 tablet (10 mg total) by mouth daily.  30 tablet  11   No current facility-administered medications for this visit.    Past Medical History  Diagnosis Date  . Myocardial infarction 1997    stents x2 (2003/2007)  . AAA (abdominal aortic aneurysm)   . Hypertension   . CAD (coronary artery disease)     CABG '97, multiple PCIs  . Dyslipidemia   . Diabetes mellitus     diet controlled    Past Surgical History  Procedure Laterality Date  . Cystectomy  2005    top of head-Jenkins  . Coronary angioplasty  2003/2007    2 stents  . 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 Ear cyst excision  07/09/2012    Procedure: CYST REMOVAL;  Surgeon: Jamesetta So, MD;  Location: [facility];  Service: General;  Laterality: N/A;  . Coronary angioplasty with stent placement  08/14/14    DES to LIMA-LAD insertion  . Coronary stent placement  08/15/2014    MID LAD  DES  by Dr Burt Knack    ROS: Review of systems complete and found to be negative unless listed above  PHYSICAL EXAM BP 160/89  Pulse 66  Ht 6' (1.829 m)  Wt 193 lb (87.544 kg)  BMI 26.17 kg/m2 General: Well developed, well nourished, in no acute distress Head: Eyes PERRLA, No xanthomas.   Normal cephalic and atramatic  Lungs: Clear bilaterally to auscultation and percussion. Heart: HRRR S1 S2, without MRG.  Pulses are 2+ & equal.            No carotid bruit. No JVD.  No abdominal bruits. No femoral bruits. Abdomen: Bowel sounds are positive, abdomen soft and non-tender without masses or  Hernia's noted. Msk:  Back normal, normal gait. Normal strength and tone for age. Extremities: No clubbing, cyanosis or edema.  DP +1 Neuro: Alert and oriented X 3. Psych:  Good affect, responds appropriately   ASSESSMENT AND PLAN

## 2014-09-01 ENCOUNTER — Ambulatory Visit (INDEPENDENT_AMBULATORY_CARE_PROVIDER_SITE_OTHER): Payer: Medicare Other | Admitting: Adult Health

## 2014-09-01 ENCOUNTER — Ambulatory Visit: Payer: Medicare Other | Admitting: Family Medicine

## 2014-09-01 ENCOUNTER — Encounter: Payer: Self-pay | Admitting: Adult Health

## 2014-09-01 VITALS — BP 160/89 | HR 66 | Ht 72.0 in | Wt 193.0 lb

## 2014-09-01 DIAGNOSIS — E785 Hyperlipidemia, unspecified: Secondary | ICD-10-CM

## 2014-09-01 DIAGNOSIS — I10 Essential (primary) hypertension: Secondary | ICD-10-CM

## 2014-09-01 DIAGNOSIS — I25812 Atherosclerosis of bypass graft of coronary artery of transplanted heart without angina pectoris: Secondary | ICD-10-CM

## 2014-09-01 MED ORDER — LOVASTATIN 20 MG PO TABS: 20.0000 mg | ORAL_TABLET | Freq: Every day | ORAL | Status: DC

## 2014-09-01 NOTE — Assessment & Plan Note (Signed)
In order save him money, I have changed his atorvastatin to lovastatin 20 mg daily. Follow up labs in 3 months.

## 2014-09-01 NOTE — Progress Notes (Deleted)
Name: Peter [name]    DOB: 07-10-1941  Age: 73 y.o.  MR#: [id number]       PCP:  Sallee Lange, MD      Insurance: Payor: [employer] MEDICARE / Plan: AARP MEDICARE COMPLETE / Product Type: *No Product type* /   CC:    Chief Complaint  Patient presents with  . Coronary Artery Disease  . Hypertension    VS Filed Vitals:   09/01/14 1411  BP: 160/89  Pulse: 66  Height: 6' (1.829 m)  Weight: 193 lb (87.544 kg)    Weights Current Weight  09/01/14 193 lb (87.544 kg)  08/15/14 193 lb 5.5 oz (87.7 kg)  08/15/14 193 lb 5.5 oz (87.7 kg)    Blood Pressure  BP Readings from Last 3 Encounters:  09/01/14 160/89  08/15/14 169/79  08/15/14 169/79     Admit date:  (Not on file) Last encounter with RMR:  Visit date not found   Allergy Dye fdc red; Ivp dye; and Plavix  Current Outpatient Prescriptions  Medication Sig Dispense Refill  . acetaminophen (TYLENOL) 325 MG tablet Take 2 tablets (650 mg total) by mouth every 4 (four) hours as needed for headache or mild pain.      Marland Kitchen amLODipine (NORVASC) 5 MG tablet Take 1 tablet (5 mg total) by mouth daily.  90 tablet  3  . aspirin 81 MG tablet Take 81 mg by mouth daily.       Marland Kitchen atenolol (TENORMIN) 100 MG tablet Take 1 tablet (100 mg total) by mouth daily.  90 tablet  1  . atorvastatin (LIPITOR) 80 MG tablet Take 1 tablet (80 mg total) by mouth daily.  90 tablet  3  . ibuprofen (ADVIL,MOTRIN) 200 MG tablet Take 600 mg by mouth daily as needed (pain).      Marland Kitchen lisinopril (PRINIVIL,ZESTRIL) 10 MG tablet Take 1 tablet (10 mg total) by mouth daily.  90 tablet  1  . metFORMIN (GLUCOPHAGE) 500 MG tablet Take 0.5 tablets (250 mg total) by mouth 2 (two) times daily with a meal.      . nitroGLYCERIN (NITROSTAT) 0.4 MG SL tablet Place 1 tablet (0.4 mg total) under the tongue every 5 (five) minutes as needed for chest pain.  30 tablet  0  . prasugrel (EFFIENT) 10 MG TABS tablet Take 1 tablet (10 mg total) by mouth daily.  30 tablet  11   No current  facility-administered medications for this visit.    Discontinued Meds:    Medications Discontinued During This Encounter  Medication Reason  . diphenhydrAMINE (SOMINEX) 25 MG tablet Error    Patient Active Problem List   Diagnosis Date Noted  . 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  PCI- LIMA-LAD insertion site DES 08/14/14 08/15/2014  . COPD (chronic obstructive pulmonary disease) 08/15/2014  . 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 Plavix allergy 08/15/2014  . Exertional angina 08/14/2014  . HTN (hypertension), benign 02/03/2014  . AAA (abdominal aortic aneurysm) without rupture 02/03/2014  . H/O ETOH abuse 04/02/2013  . Bowel habit changes 04/02/2013  . Dyslipidemia 03/13/2013  . Type II diabetes mellitus, well controlled 03/13/2013  . Pain, abdominal, nonspecific 03/13/2013    LABS    Component Value Date/Time   NA 139 08/15/2014 0400   NA 138 08/06/2014 1146   NA 136* 07/31/2014 1210   K 3.8 08/15/2014 0400   K 4.5 08/06/2014 1146   K 4.2 07/31/2014 1210   CL 104 08/15/2014 0400  CL 100 08/06/2014 1146   CL 100 07/31/2014 1210   CO2 23 08/15/2014 0400   CO2 27 08/06/2014 1146   CO2 25 07/31/2014 1210   GLUCOSE 185* 08/15/2014 0400   GLUCOSE 134* 08/06/2014 1146   GLUCOSE 176* 07/31/2014 1210   BUN 18 08/15/2014 0400   BUN 12 08/06/2014 1146   BUN 16 07/31/2014 1210   CREATININE 1.15 08/15/2014 0400   CREATININE 1.05 08/06/2014 1146   CREATININE 1.06 07/31/2014 1210   CREATININE 1.10 02/05/2014 1000   CREATININE 1.14 03/13/2013 1158   CREATININE 1.05 07/02/2012 1125   CALCIUM 8.3* 08/15/2014 0400   CALCIUM 9.0 08/06/2014 1146   CALCIUM 9.0 07/31/2014 1210   GFRNONAA 62* 08/15/2014 0400   GFRNONAA 68* 07/31/2014 1210   GFRNONAA 70* 07/02/2012 1125   GFRAA 72* 08/15/2014 0400   GFRAA 79* 07/31/2014 1210   GFRAA 81* 07/02/2012 1125   CMP     Component Value Date/Time   NA 139 08/15/2014 0400   K 3.8 08/15/2014 0400   CL 104 08/15/2014 0400   CO2 23 08/15/2014 0400   GLUCOSE 185*  08/15/2014 0400   BUN 18 08/15/2014 0400   CREATININE 1.15 08/15/2014 0400   CREATININE 1.05 08/06/2014 1146   CALCIUM 8.3* 08/15/2014 0400   PROT 7.1 08/06/2014 1146   ALBUMIN 4.1 08/06/2014 1146   AST 26 08/06/2014 1146   ALT 25 08/06/2014 1146   ALKPHOS 61 08/06/2014 1146   BILITOT 0.9 08/06/2014 1146   GFRNONAA 62* 08/15/2014 0400   GFRAA 72* 08/15/2014 0400       Component Value Date/Time   WBC 10.7* 08/15/2014 0400   WBC 12.4* 08/14/2014 1108   WBC 6.2 07/31/2014 1210   HGB 13.0 08/15/2014 0400   HGB 14.5 08/14/2014 1108   HGB 15.0 07/31/2014 1210   HCT 36.7* 08/15/2014 0400   HCT 40.4 08/14/2014 1108   HCT 41.7 07/31/2014 1210   MCV 96.3 08/15/2014 0400   MCV 92.9 08/14/2014 1108   MCV 94.8 07/31/2014 1210    Lipid Panel     Component Value Date/Time   CHOL 183 08/06/2014 1146   TRIG 115 08/06/2014 1146   HDL 36* 08/06/2014 1146   CHOLHDL 5.1 08/06/2014 1146   VLDL 23 08/06/2014 1146   LDLCALC 124* 08/06/2014 1146    ABG No results found for this basename: phart, pco2, pco2art, po2, po2art, hco3, tco2, acidbasedef, o2sat     No results found for this basename: TSH   BNP (last 3 results) No results found for this basename: PROBNP,  in the last 8760 hours Cardiac Panel (last 3 results) No results found for this basename: CKTOTAL, CKMB, TROPONINI, RELINDX,  in the last 72 hours  Iron/TIBC/Ferritin/ %Sat No results found for this basename: iron, tibc, ferritin, ironpctsat     EKG Orders placed in visit on 08/20/14  . 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 Reports new onset constipation when he stopped consuming etoh 01/2013. Previously had loose stool. Denies brbpr, melena. H/O intermittent etoh abuse over past five years but with periods of no etoh use for 6 months at a time. Previously with some vague abdominal discomfort but states this has resolved. GI symptoms possibly related to etoh gastritis/colopathy but cannot exclude pancreatitic etiology. Denies GERD symptoms. Did not take the PPI recommended by Dr. Wolfgang Phoenix a few weeks ago. Tried some "home remedies." Discussed the recommendations of pursing colonoscopy for bowel changes which could be secondary to d/c etoh use but need to exclude malignancy. Discussed risks at length with patient including pros/cons of procedure. He would like to think about it before agreeing to scheduling a colonoscopy. He has also requesting to know expense of procedure and our office staff will help him with that.   He will call us and let us know what he decides.   If he has recurrent abdominal pain, would consider CT A/P at that time. He will continue to have u/s surveillance of his AAA with Dr. Wolfgang Phoenix as planned.     PCI- LIMA-LAD insertion site DES 08/14/14       Imaging: Dg Chest 2 View  08/15/2014   CLINICAL DATA:  Post catheterization.  History of smoking.  EXAM: CHEST  2 VIEW  COMPARISON:  None.  FINDINGS: Borderline enlarged cardiac silhouette and mediastinal contours post median sternotomy and CABG. Atherosclerotic plaque within a tortuous thoracic aorta. The lungs are hyperexpanded with mild diffuse slightly nodular thickening of the pulmonary interstitium. No focal airspace opacities. No pleural effusion or pneumothorax. No evidence of edema. No acute osseus abnormalities.  IMPRESSION: Hyperexpanded lungs and bronchitic change without  acute cardiopulmonary disease.   Electronically Signed   By: Sandi Mariscal M.D.   On: 08/15/2014 09:23

## 2014-09-01 NOTE — Assessment & Plan Note (Signed)
He is s/p DES to the LIMA to LAD insertion. He is feeling better and is compliant with his medications, He is concerned with the cost of the antiplatelet medications. I will give him samples of Effient, he is given paperwork to fill out for reduced cost Rx. He is advised to begin cardiac rehab. He otherwise will stay on current regimen. Will see him again in 3 months.

## 2014-09-01 NOTE — Assessment & Plan Note (Signed)
His is slighlty hypertensive today, but admits to white coat syndrome He is still taking lisinopril and amlodipine. If remains elevated on next visit, may need to titrate up the amlodipine or ACE on next visit.

## 2014-09-01 NOTE — Patient Instructions (Signed)
Your physician recommends that you schedule a follow-up appointment in: 3 months    STOP Lipitor   START Lovastatin 20 mg daily     I have given you 30 day supply of Effient today      Thank you for choosing [facility] !

## 2014-09-03 ENCOUNTER — Encounter: Payer: Self-pay | Admitting: Family Medicine

## 2014-09-03 ENCOUNTER — Ambulatory Visit (INDEPENDENT_AMBULATORY_CARE_PROVIDER_SITE_OTHER): Payer: Medicare Other | Admitting: Family Medicine

## 2014-09-03 VITALS — BP 120/72 | Ht 72.0 in | Wt 193.6 lb

## 2014-09-03 DIAGNOSIS — I10 Essential (primary) hypertension: Secondary | ICD-10-CM

## 2014-09-03 NOTE — Progress Notes (Signed)
   Subjective:    Patient ID: Peter [name], male    DOB: [birth year], 73 y.o.   MRN: [id number]  HPI  Patient arrives for a follow up on stent and blood pressure. Patient followed up with Cardiologist last week. He denies chest tightness pressure pain shortness of breath he states he is trying to stay active try to ride is not smoking taking his medicines as directed Review of Systems  Constitutional: Negative for activity change, appetite change and fatigue.  HENT: Negative for congestion.   Respiratory: Negative for cough.   Cardiovascular: Negative for chest pain.  Gastrointestinal: Negative for abdominal pain.  Endocrine: Negative for polydipsia and polyphagia.  Neurological: Negative for weakness.  Psychiatric/Behavioral: Negative for confusion.       Objective:   Physical Exam  Vitals reviewed. Constitutional: He appears well-nourished. No distress.  Cardiovascular: Normal rate, regular rhythm and normal heart sounds.   No murmur heard. Pulmonary/Chest: Effort normal and breath sounds normal. No respiratory distress.  Musculoskeletal: He exhibits no edema.  Lymphadenopathy:    He has no cervical adenopathy.  Neurological: He is alert.  Psychiatric: His behavior is normal.          Assessment & Plan:  Heart disease-patient is doing the best he can and try and stay healthy with his eating in compliance with his medicines.  I encouraged him strongly to get a flu vaccine and pneumonia vaccine he defers on both He states he will followup with [place] again in 2-3 months. We will check A1c at that visit

## 2014-10-10 ENCOUNTER — Other Ambulatory Visit: Payer: Self-pay | Admitting: Family Medicine

## 2014-10-24 ENCOUNTER — Telehealth: Payer: Self-pay | Admitting: *Deleted

## 2014-10-24 MED ORDER — PRASUGREL HCL 10 MG PO TABS: 10.0000 mg | ORAL_TABLET | Freq: Every day | ORAL | Status: DC

## 2014-10-24 NOTE — Telephone Encounter (Signed)
We gave patient a card for free effient, He needs Rx called in to [facility] [place] so he can use this card for this month

## 2014-10-24 NOTE — Telephone Encounter (Signed)
escribed to [facility]

## 2014-10-24 NOTE — Telephone Encounter (Signed)
[facility] [employer]  declined pt assistance as pt is over 5 and is medicare eligible,wife made aware

## 2014-11-12 ENCOUNTER — Encounter: Payer: Self-pay | Admitting: Family Medicine

## 2014-11-12 ENCOUNTER — Ambulatory Visit: Payer: Medicare Other | Admitting: Nurse Practitioner

## 2014-11-12 ENCOUNTER — Ambulatory Visit (INDEPENDENT_AMBULATORY_CARE_PROVIDER_SITE_OTHER): Payer: Medicare Other | Admitting: Family Medicine

## 2014-11-12 VITALS — BP 132/90 | Temp 98.1°F | Ht 72.0 in | Wt 197.2 lb

## 2014-11-12 DIAGNOSIS — W57XXXA Bitten or stung by nonvenomous insect and other nonvenomous arthropods, initial encounter: Secondary | ICD-10-CM

## 2014-11-12 DIAGNOSIS — J0100 Acute maxillary sinusitis, unspecified: Secondary | ICD-10-CM

## 2014-11-12 DIAGNOSIS — T148 Other injury of unspecified body region: Secondary | ICD-10-CM

## 2014-11-12 MED ORDER — DOXYCYCLINE HYCLATE 100 MG PO CAPS: 100.0000 mg | ORAL_CAPSULE | Freq: Two times a day (BID) | ORAL | Status: DC

## 2014-11-12 MED ORDER — AMOXICILLIN 500 MG PO TABS: 500.0000 mg | ORAL_TABLET | Freq: Three times a day (TID) | ORAL | Status: DC

## 2014-11-12 NOTE — Progress Notes (Signed)
   Subjective:    Patient ID: Peter [name], male    DOB: 06/20/1941, 73 y.o.   MRN: [id number]  Sinusitis This is a new problem. The current episode started 1 to 4 weeks ago. The problem is unchanged. There has been no fever. The pain is moderate. Associated symptoms include congestion, coughing and a sore throat. Pertinent negatives include no ear pain. Past treatments include antibiotics. The treatment provided mild relief.   Patient states that he removed a tick off of his left upper leg on Sunday. Patient states that the area is red.  He also relates head congestion drainage coughing sinus pressure denies high fever chills  Review of Systems  Constitutional: Negative for fever and activity change.  HENT: Positive for congestion, rhinorrhea and sore throat. Negative for ear pain.   Eyes: Negative for discharge.  Respiratory: Positive for cough. Negative for wheezing.   Cardiovascular: Negative for chest pain.       Objective:   Physical Exam  Constitutional: He appears well-developed.  HENT:  Head: Normocephalic.  Mouth/Throat: Oropharynx is clear and moist. No oropharyngeal exudate.  Neck: Normal range of motion.  Cardiovascular: Normal rate, regular rhythm and normal heart sounds.   No murmur heard. Pulmonary/Chest: Effort normal and breath sounds normal. He has no wheezes.  Lymphadenopathy:    He has no cervical adenopathy.  Neurological: He exhibits normal muscle tone.  Skin: Skin is warm and dry.  Nursing note and vitals reviewed.  Some redness proximally 4 inches in diameter noted on the left upper thigh where a tick bite occurred       Assessment & Plan:  Acute sinusitis antibiotics prescribed  Tick bite with localized infection dockside can twice a day 10 days  If high fevers chills bruising or other problems occur immediately follow-up here or ER

## 2014-11-13 ENCOUNTER — Encounter (HOSPITAL_COMMUNITY): Payer: Self-pay | Admitting: Cardiovascular Disease

## 2014-11-18 ENCOUNTER — Other Ambulatory Visit: Payer: Self-pay | Admitting: Family Medicine

## 2014-11-19 ENCOUNTER — Ambulatory Visit: Payer: Medicare Other | Admitting: Family Medicine

## 2014-12-01 ENCOUNTER — Encounter: Payer: Medicare Other | Admitting: Adult Health

## 2014-12-01 NOTE — Progress Notes (Signed)
    ERROR No Show

## 2014-12-04 ENCOUNTER — Telehealth: Payer: Self-pay | Admitting: *Deleted

## 2014-12-04 ENCOUNTER — Other Ambulatory Visit: Payer: Self-pay | Admitting: *Deleted

## 2014-12-04 DIAGNOSIS — I714 Abdominal aortic aneurysm, without rupture, unspecified: Secondary | ICD-10-CM

## 2014-12-04 NOTE — Telephone Encounter (Signed)
Pt in [place] file for repeat aorta ultrasound. Need to call pt and find out a good time to schedule. Order has been put in.

## 2014-12-08 ENCOUNTER — Encounter: Payer: Medicare Other | Admitting: Adult Health

## 2014-12-10 NOTE — Telephone Encounter (Signed)
Pt can go anytime next week except Monday. He prefers appt between 1 - 2:30

## 2014-12-11 ENCOUNTER — Ambulatory Visit (INDEPENDENT_AMBULATORY_CARE_PROVIDER_SITE_OTHER): Payer: Medicare Other | Admitting: Adult Health

## 2014-12-11 ENCOUNTER — Encounter: Payer: Self-pay | Admitting: Adult Health

## 2014-12-11 VITALS — BP 138/90 | HR 57 | Ht 72.0 in | Wt 191.8 lb

## 2014-12-11 DIAGNOSIS — I10 Essential (primary) hypertension: Secondary | ICD-10-CM | POA: Diagnosis not present

## 2014-12-11 DIAGNOSIS — Z955 Presence of coronary angioplasty implant and graft: Secondary | ICD-10-CM | POA: Diagnosis not present

## 2014-12-11 NOTE — Assessment & Plan Note (Signed)
  Blood pressure is well controlled. No changes in medication regimen at this time. Will see him again in 6 months unless symptomatic.,

## 2014-12-11 NOTE — Progress Notes (Deleted)
Name: Peter [name]    DOB: February 20, 1941  Age: 74 y.o.  MR#: [id number]       PCP:  Sallee Lange, MD      Insurance: Payor: [employer] MEDICARE / Plan: [facility] MEDICARE / Product Type: *No Product type* /   CC:    Chief Complaint  Patient presents with  . Coronary Artery Disease    CABG 1997, PCI 2013, and 2008    VS Filed Vitals:   12/11/14 1316  BP: 138/90  Pulse: 57  Height: 6' (1.829 m)  Weight: 191 lb 12.8 oz (87 kg)    Weights Current Weight  12/11/14 191 lb 12.8 oz (87 kg)  11/12/14 197 lb 4 oz (89.472 kg)  09/03/14 193 lb 9.6 oz (87.816 kg)    Blood Pressure  BP Readings from Last 3 Encounters:  12/11/14 138/90  11/12/14 132/90  09/03/14 120/72     Admit date:  (Not on file) Last encounter with RMR:  09/01/2014   Allergy Dye fdc red; Ivp dye; and Plavix  Current Outpatient Prescriptions  Medication Sig Dispense Refill  . ACCU-CHEK COMPACT PLUS test strip USE AS DIRECTED TO TEST BLOOD GLUCOSE FOUR TIMES DAILY 102 each 0  . acetaminophen (TYLENOL) 325 MG tablet Take 2 tablets (650 mg total) by mouth every 4 (four) hours as needed for headache or mild pain.    Marland Kitchen amLODipine (NORVASC) 5 MG tablet Take 1 tablet (5 mg total) by mouth daily. 90 tablet 3  . amoxicillin (AMOXIL) 500 MG tablet Take 1 tablet (500 mg total) by mouth 3 (three) times daily. 30 tablet 0  . aspirin 81 MG tablet Take 81 mg by mouth daily.     Marland Kitchen atenolol (TENORMIN) 100 MG tablet TAKE ONE TABLET BY MOUTH ONCE DAILY 90 tablet 1  . doxycycline (VIBRAMYCIN) 100 MG capsule Take 1 capsule (100 mg total) by mouth 2 (two) times daily. 20 capsule 0  . ibuprofen (ADVIL,MOTRIN) 200 MG tablet Take 600 mg by mouth daily as needed (pain).    Marland Kitchen lisinopril (PRINIVIL,ZESTRIL) 10 MG tablet Take 1 tablet (10 mg total) by mouth daily. 90 tablet 1  . lovastatin (MEVACOR) 20 MG tablet Take 1 tablet (20 mg total) by mouth at bedtime. 90 tablet 3  . metFORMIN (GLUCOPHAGE) 500 MG tablet Take 0.5 tablets (250 mg total)  by mouth 2 (two) times daily with a meal.    . nitroGLYCERIN (NITROSTAT) 0.4 MG SL tablet Place 1 tablet (0.4 mg total) under the tongue every 5 (five) minutes as needed for chest pain. 30 tablet 0  . prasugrel (EFFIENT) 10 MG TABS tablet Take 1 tablet (10 mg total) by mouth daily. 30 tablet 11   No current facility-administered medications for this visit.    Discontinued Meds:   There are no discontinued medications.  Patient Active Problem List   Diagnosis Date Noted  . 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  PCI- LIMA-LAD insertion site DES 08/14/14 08/15/2014  . COPD (chronic obstructive pulmonary disease) 08/15/2014  . 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 Plavix allergy 08/15/2014  . Exertional angina 08/14/2014  . HTN (hypertension), benign 02/03/2014  . AAA (abdominal aortic aneurysm) without rupture 02/03/2014  . H/O ETOH abuse 04/02/2013  . Bowel habit changes 04/02/2013  . Dyslipidemia 03/13/2013  . Type II diabetes mellitus, well controlled 03/13/2013  . Pain, abdominal, nonspecific 03/13/2013    LABS    Component Value Date/Time   NA 139 08/15/2014 0400  NA 138 08/06/2014 1146   NA 136* 07/31/2014 1210   K 3.8 08/15/2014 0400   K 4.5 08/06/2014 1146   K 4.2 07/31/2014 1210   CL 104 08/15/2014 0400   CL 100 08/06/2014 1146   CL 100 07/31/2014 1210   CO2 23 08/15/2014 0400   CO2 27 08/06/2014 1146   CO2 25 07/31/2014 1210   GLUCOSE 185* 08/15/2014 0400   GLUCOSE 134* 08/06/2014 1146   GLUCOSE 176* 07/31/2014 1210   BUN 18 08/15/2014 0400   BUN 12 08/06/2014 1146   BUN 16 07/31/2014 1210   CREATININE 1.15 08/15/2014 0400   CREATININE 1.05 08/06/2014 1146   CREATININE 1.06 07/31/2014 1210   CREATININE 1.10 02/05/2014 1000   CREATININE 1.14 03/13/2013 1158   CREATININE 1.05 07/02/2012 1125   CALCIUM 8.3* 08/15/2014 0400   CALCIUM 9.0 08/06/2014 1146   CALCIUM 9.0 07/31/2014 1210   GFRNONAA 62* 08/15/2014 0400   GFRNONAA 68* 07/31/2014 1210    GFRNONAA 70* 07/02/2012 1125   GFRAA 72* 08/15/2014 0400   GFRAA 79* 07/31/2014 1210   GFRAA 81* 07/02/2012 1125   CMP     Component Value Date/Time   NA 139 08/15/2014 0400   K 3.8 08/15/2014 0400   CL 104 08/15/2014 0400   CO2 23 08/15/2014 0400   GLUCOSE 185* 08/15/2014 0400   BUN 18 08/15/2014 0400   CREATININE 1.15 08/15/2014 0400   CREATININE 1.05 08/06/2014 1146   CALCIUM 8.3* 08/15/2014 0400   PROT 7.1 08/06/2014 1146   ALBUMIN 4.1 08/06/2014 1146   AST 26 08/06/2014 1146   ALT 25 08/06/2014 1146   ALKPHOS 61 08/06/2014 1146   BILITOT 0.9 08/06/2014 1146   GFRNONAA 62* 08/15/2014 0400   GFRAA 72* 08/15/2014 0400       Component Value Date/Time   WBC 10.7* 08/15/2014 0400   WBC 12.4* 08/14/2014 1108   WBC 6.2 07/31/2014 1210   HGB 13.0 08/15/2014 0400   HGB 14.5 08/14/2014 1108   HGB 15.0 07/31/2014 1210   HCT 36.7* 08/15/2014 0400   HCT 40.4 08/14/2014 1108   HCT 41.7 07/31/2014 1210   MCV 96.3 08/15/2014 0400   MCV 92.9 08/14/2014 1108   MCV 94.8 07/31/2014 1210    Lipid Panel     Component Value Date/Time   CHOL 183 08/06/2014 1146   TRIG 115 08/06/2014 1146   HDL 36* 08/06/2014 1146   CHOLHDL 5.1 08/06/2014 1146   VLDL 23 08/06/2014 1146   LDLCALC 124* 08/06/2014 1146    ABG No results found for: PHART, PCO2ART, PO2ART, HCO3, TCO2, ACIDBASEDEF, O2SAT   No results found for: TSH BNP (last 3 results) No results for input(s): PROBNP in the last 8760 hours. Cardiac Panel (last 3 results) No results for input(s): CKTOTAL, CKMB, TROPONINI, RELINDX in the last 72 hours.  Iron/TIBC/Ferritin/ %Sat No results found for: IRON, TIBC, FERRITIN, IRONPCTSAT   EKG Orders placed or performed in visit on 08/20/14  . EKG 12-Lead     Prior Assessment and Plan Problem List as of 12/11/2014    Dyslipidemia   Last Assessment & Plan   09/01/2014 Office Visit Written 09/01/2014  2:41 PM by Lendon Colonel, NP    In order save him money, I have changed his  atorvastatin to lovastatin 20 mg daily. Follow up labs in 3 months.     Type II diabetes mellitus, well controlled   Pain, abdominal, nonspecific   H/O ETOH abuse   Bowel habit changes  Last Assessment & Plan   04/02/2013 Office Visit Written 04/02/2013 11:19 AM by Mahala Menghini, PA-C    74 y/o male with recent bowel habit change. Reports new onset constipation when he stopped consuming etoh 01/2013. Previously had loose stool. Denies brbpr, melena. H/O intermittent etoh abuse over past five years but with periods of no etoh use for 6 months at a time. Previously with some vague abdominal discomfort but states this has resolved. GI symptoms possibly related to etoh gastritis/colopathy but cannot exclude pancreatitic etiology. Denies GERD symptoms. Did not take the PPI recommended by Dr. Wolfgang Phoenix a few weeks ago. Tried some "home remedies." Discussed the recommendations of pursing colonoscopy for bowel changes which could be secondary to d/c etoh use but need to exclude malignancy. Discussed risks at length with patient including pros/cons of procedure. He would like to think about it before agreeing to scheduling a colonoscopy. He has also requesting to know expense of procedure and our office staff will help him with that.   He will call us and let us know what he decides.   If he has recurrent abdominal pain, would consider CT A/P at that time. He will continue to have u/s surveillance of his AAA with Dr. Wolfgang Phoenix as planned.    HTN (hypertension), benign   Last Assessment & Plan   09/01/2014 Office Visit Written 09/01/2014  2:40 PM by Lendon Colonel, NP    His is slighlty hypertensive today, but admits to white coat syndrome He is still taking lisinopril and amlodipine. If remains elevated on next visit, may need to titrate up the amlodipine or ACE on next visit.     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 He is feeling better and is compliant with his medications, He is concerned with the cost of the antiplatelet medications. I will give him samples of Effient, he is given paperwork to fill out for reduced cost Rx. He is advised to begin cardiac rehab. He otherwise will stay on current regimen. Will see him again in 3 months.      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

## 2014-12-11 NOTE — Patient Instructions (Addendum)
Your physician wants you to follow-up in: 6 months. You will receive a reminder letter in the mail two months in advance. If you don't receive a letter, please call our office to schedule the follow-up appointment.  Your physician recommends that you continue on your current medications as directed. Please refer to the Current Medication list given to you today.  You were given samples to Effient today  Thank you for choosing [facility]!

## 2014-12-11 NOTE — Progress Notes (Signed)
HPI: [name] is a 74 year old patient of Dr. Carlyle [name], we follow for ongoing assessment and management of CAD, with history of CABG in 1997 with subsequent PCI 2003 and 2008, most recent cardiac catheterization demonstrating 90% stenosis just beyond the graft insertion site of the LIMA to LAD.  This was intervened with drug-eluting stent in September of 2015.  He was last seen in the office post hospitalization was doing well with the exception of cost of apixaban.  He was given some samples, and also filled out paperwork for reduced price.  He was also changed from atorvastatin to lovastatin as this was on a lower price level for him.     He has recently gotten over a cold and has had some GI issues. He has broken a tooth and is to have some dental surgery soon. Wants to know if ok to stop Effient for a day or two around the time of surgery. Otherwise no cardiac complaints of bleeding or chest pain.  Allergies  Allergen Reactions  . Dye Fdc Red [Red Dye]     hives  . Ivp Dye [Iodinated Diagnostic Agents] Hives  . Plavix [Clopidogrel] Hives    Unsure if dye or Plavix    Current Outpatient Prescriptions  Medication Sig Dispense Refill  . ACCU-CHEK COMPACT PLUS test strip USE AS DIRECTED TO TEST BLOOD GLUCOSE FOUR TIMES DAILY 102 each 0  . acetaminophen (TYLENOL) 325 MG tablet Take 2 tablets (650 mg total) by mouth every 4 (four) hours as needed for headache or mild pain.    Marland Kitchen amLODipine (NORVASC) 5 MG tablet Take 1 tablet (5 mg total) by mouth daily. 90 tablet 3  . amoxicillin (AMOXIL) 500 MG tablet Take 1 tablet (500 mg total) by mouth 3 (three) times daily. 30 tablet 0  . aspirin 81 MG tablet Take 81 mg by mouth daily.     Marland Kitchen atenolol (TENORMIN) 100 MG tablet TAKE ONE TABLET BY MOUTH ONCE DAILY 90 tablet 1  . doxycycline (VIBRAMYCIN) 100 MG capsule Take 1 capsule (100 mg total) by mouth 2 (two) times daily. 20 capsule 0  . ibuprofen (ADVIL,MOTRIN) 200 MG tablet Take 600 mg by  mouth daily as needed (pain).    Marland Kitchen lisinopril (PRINIVIL,ZESTRIL) 10 MG tablet Take 1 tablet (10 mg total) by mouth daily. 90 tablet 1  . lovastatin (MEVACOR) 20 MG tablet Take 1 tablet (20 mg total) by mouth at bedtime. 90 tablet 3  . metFORMIN (GLUCOPHAGE) 500 MG tablet Take 0.5 tablets (250 mg total) by mouth 2 (two) times daily with a meal.    . nitroGLYCERIN (NITROSTAT) 0.4 MG SL tablet Place 1 tablet (0.4 mg total) under the tongue every 5 (five) minutes as needed for chest pain. 30 tablet 0  . prasugrel (EFFIENT) 10 MG TABS tablet Take 1 tablet (10 mg total) by mouth daily. 30 tablet 11   No current facility-administered medications for this visit.    Past Medical History  Diagnosis Date  . Myocardial infarction 1997    stents x2 (2003/2007)  . AAA (abdominal aortic aneurysm)   . Hypertension   . CAD (coronary artery disease)     CABG '97, multiple PCIs  . Dyslipidemia   . Diabetes mellitus     diet controlled    Past Surgical History  Procedure Laterality Date  . Cystectomy  2005    top of head-Peter [name]  . Coronary angioplasty  2003/2007    2 stents  . Coronary artery  bypass graft  1997    7 vessels  . Ear cyst excision  07/09/2012    Procedure: CYST REMOVAL;  Surgeon: Peter So, MD;  Location: [facility];  Service: General;  Laterality: N/A;  . Coronary angioplasty with stent placement  08/14/14    DES to LIMA-LAD insertion  . Coronary stent placement  08/15/2014    MID LAD  DES  by Dr Peter [name]  . Left heart catheterization with coronary/graft angiogram N/A 08/14/2014    Procedure: LEFT HEART CATHETERIZATION WITH Peter [name];  Surgeon: Peter Ohara, MD;  Location: [facility] CATH LAB;  Service: Cardiovascular;  Laterality: N/A;    [id number] review of systems performed and found to be negative unless outlined above  PHYSICAL EXAM BP 138/90 mmHg  Pulse 57  Ht 6' (1.829 m)  Wt 191 lb 12.8 oz (87 kg)  BMI 26.01 kg/m2 General: Well developed, well nourished,  in no acute distress Head: Eyes PERRLA, No xanthomas.   Normal cephalic and atramatic  Lungs: Clear bilaterally to auscultation and percussion. Heart: HRRR S1 S2, soft 1/6 systolic murmur..  Pulses are 2+ & equal.            No carotid bruit. No JVD.  No abdominal bruits. No femoral bruits. Abdomen: Bowel sounds are positive, abdomen soft and non-tender without masses or                  Hernia's noted. Msk:  Back normal, normal gait. Normal strength and tone for age. Extremities: No clubbing, cyanosis or edema.  DP +1 Neuro: Alert and oriented X 3. Psych:  Good affect, responds appropriately  ASSESSMENT AND PLAN

## 2014-12-11 NOTE — Telephone Encounter (Signed)
Patient notified and verbalized understanding. [place] scheduled for Tuesday, Jan 12th at [facility]

## 2014-12-11 NOTE — Assessment & Plan Note (Signed)
I have explained to him that he should remain on Effient for a minimum of one year, if surgery is indicated, usually wait for 6 months before stopping temporarily, He is anxious to have teeth pulled due to broken teeth. I have asked him to discuss this with oral surgeon concerning his preference about stopping Effient or doing extractions on this medication. It is our recommendation that he wait until March of 2016 for elective oral surgery. He verbalizes understanding. I have given him samples of Effient.

## 2014-12-16 ENCOUNTER — Ambulatory Visit (HOSPITAL_COMMUNITY): Payer: Self-pay

## 2014-12-18 ENCOUNTER — Ambulatory Visit (HOSPITAL_COMMUNITY)
Admission: RE | Admit: 2014-12-18 | Discharge: 2014-12-18 | Disposition: A | Discharge status: Home / Self Care | Payer: Medicare Other | Source: Ambulatory Visit | Attending: Family Medicine | Admitting: Family Medicine

## 2014-12-18 DIAGNOSIS — I714 Abdominal aortic aneurysm, without rupture: Secondary | ICD-10-CM | POA: Insufficient documentation

## 2014-12-19 ENCOUNTER — Telehealth: Payer: Self-pay | Admitting: *Deleted

## 2014-12-19 NOTE — Telephone Encounter (Signed)
Notes Recorded by Dairl Ponder, RN on 12/19/2014 at 1:39 PM Left message to return call with patient (Already put in [place]) ------  Notes Recorded by Kathyrn Drown, MD on 12/18/2014 at 12:41 PM The aneurysm has grown some but it is not at a level that needs surgery. When it starts getting in the greater than 5 cm range then it will need surgery. I would recommend that this patient follow-up with me in March or April. We can discuss it further at that time. He will need a repeat ultrasound in 6 months in June. Please put this in the [place] file. More than likely we will be referring him to a vascular surgeon after our discussion in March or April

## 2014-12-22 NOTE — Telephone Encounter (Signed)
Discussed with patient. Pt transferred to front to schedule office visit in march/april. Pt was already in [facility] file for June 2016.

## 2014-12-30 ENCOUNTER — Other Ambulatory Visit: Payer: Self-pay | Admitting: Family Medicine

## 2015-01-21 ENCOUNTER — Other Ambulatory Visit: Payer: Self-pay | Admitting: Family Medicine

## 2015-03-03 ENCOUNTER — Encounter: Payer: Self-pay | Admitting: Family Medicine

## 2015-03-03 ENCOUNTER — Ambulatory Visit (INDEPENDENT_AMBULATORY_CARE_PROVIDER_SITE_OTHER): Payer: Medicare Other | Admitting: Family Medicine

## 2015-03-03 VITALS — BP 128/70 | Ht 72.0 in | Wt 192.0 lb

## 2015-03-03 DIAGNOSIS — E785 Hyperlipidemia, unspecified: Secondary | ICD-10-CM | POA: Diagnosis not present

## 2015-03-03 DIAGNOSIS — I714 Abdominal aortic aneurysm, without rupture, unspecified: Secondary | ICD-10-CM

## 2015-03-03 DIAGNOSIS — I723 Aneurysm of iliac artery: Secondary | ICD-10-CM | POA: Diagnosis not present

## 2015-03-03 DIAGNOSIS — D649 Anemia, unspecified: Secondary | ICD-10-CM

## 2015-03-03 DIAGNOSIS — I10 Essential (primary) hypertension: Secondary | ICD-10-CM

## 2015-03-03 DIAGNOSIS — E119 Type 2 diabetes mellitus without complications: Secondary | ICD-10-CM | POA: Diagnosis not present

## 2015-03-03 LAB — POCT GLYCOSYLATED HEMOGLOBIN (HGB A1C): Hemoglobin A1C: 7.2

## 2015-03-03 MED ORDER — GLIPIZIDE 5 MG PO TABS: 2.5000 mg | ORAL_TABLET | Freq: Two times a day (BID) | ORAL | Status: DC

## 2015-03-03 NOTE — Progress Notes (Addendum)
   Subjective:    Patient ID: Peter [name], male    DOB: 27-Dec-1940, 74 y.o.   MRN: [id number]  Diabetes He presents for his follow-up diabetic visit. He has type 2 diabetes mellitus. He is following a generally unhealthy diet. His breakfast blood glucose range is generally 140-180 mg/dl. He does not see a podiatrist.Eye exam is not current.   Pt states he stopped taking metformin and lovastatin because of side effects.  Greater than 25 minutes was spent today discussing his diabetes cholesterol side effects of his medications as well as the aortic aneurysm and iliac aneurysm. He was also talked to at length that if he should start having severe abdominal pains or other problems that would point toward the possibility of a ruptured aneurysm to immediately seek help at hospital Follow up on aorta aneurysm.  Follow up on groin pain. Pain comes and goes.    Review of Systems     Objective:   Physical Exam His lungs are clear hearts regular flank nontender abdomen soft no masses felt no tenderness. Extremities no edema skin warm dry diabetic foot exam completed patient normal       Assessment & Plan:  Scrotal pain-I believe this is scar tissue from previous vasectomy I find no evidence of any type of tumor growth or hernia  AAA-gradually getting better attempt 4 cm. I'd recommend repeat ultrasound in June/July with follow-up office visit at that point in time if continuing to get larger we will transfer this area over to the vascular surgeon. Patient denies any pain  DM-fair control cannot tolerate Glucophage will use glipizide hopefully this will help he was educated about low sugar spells in the goals of his sugars. He will notify us if any problems he'll watch his diet stay physically active  Hyperlipidemia-he states that he will try the Mevacor half tablet 3 days a week but states taking it every day makes him feel bad he will let us know if ongoing troubles.  Repeat u/s July-this  was placed in the [facility] file. He was encouraged to follow-up in July with an ultrasound and follow-up office visit

## 2015-03-03 NOTE — Patient Instructions (Signed)
Glucose reading goals  Am/fasting 100 to 130  premeal below 150  prebedtime below 160

## 2015-03-06 ENCOUNTER — Other Ambulatory Visit: Payer: Self-pay | Admitting: Family Medicine

## 2015-03-09 ENCOUNTER — Telehealth: Payer: Self-pay | Admitting: Adult Health

## 2015-03-09 NOTE — Telephone Encounter (Signed)
Please call patient about refilling his effient / tg

## 2015-03-11 ENCOUNTER — Ambulatory Visit: Payer: Self-pay | Admitting: Family Medicine

## 2015-04-06 ENCOUNTER — Encounter: Payer: Self-pay | Admitting: Licensed Clinical Social Worker

## 2015-04-06 NOTE — Patient Outreach (Signed)
  [facility]) Care Management  [facility] Social Work  04/06/2015  Peter [name] 1941-02-28 [id number]  Subjective:    Objective:   Current Medications:  Current Outpatient Prescriptions  Medication Sig Dispense Refill  . ACCU-CHEK COMPACT PLUS test strip USE AS DIRECTED TO TEST BLOOD GLUCOSE FOUR TIMES A DAY 100 each 5  . acetaminophen (TYLENOL) 325 MG tablet Take 2 tablets (650 mg total) by mouth every 4 (four) hours as needed for headache or mild pain.    Marland Kitchen aspirin 81 MG tablet Take 81 mg by mouth daily.     Marland Kitchen atenolol (TENORMIN) 100 MG tablet TAKE ONE TABLET BY MOUTH ONCE DAILY 90 tablet 1  . glipiZIDE (GLUCOTROL) 5 MG tablet Take 0.5 tablets (2.5 mg total) by mouth 2 (two) times daily before a meal. 30 tablet 5  . ibuprofen (ADVIL,MOTRIN) 200 MG tablet Take 600 mg by mouth daily as needed (pain).    Marland Kitchen lisinopril (PRINIVIL,ZESTRIL) 10 MG tablet TAKE ONE TABLET BY MOUTH ONCE DAILY **NEEDS OFFICE VISIT** 30 tablet 2  . lovastatin (MEVACOR) 20 MG tablet Take 20 mg by mouth. Take one half tablet mon, wed, and friday    . nitroGLYCERIN (NITROSTAT) 0.4 MG SL tablet Place 1 tablet (0.4 mg total) under the tongue every 5 (five) minutes as needed for chest pain. 30 tablet 0  . prasugrel (EFFIENT) 10 MG TABS tablet Take 1 tablet (10 mg total) by mouth daily. 30 tablet 11   No current facility-administered medications for this visit.    Functional Status:  In your present state of health, do you have any difficulty performing the following activities: 04/06/2015 08/22/2014  Hearing? N -  Vision? N Y  Difficulty concentrating or making decisions? N -  Walking or climbing stairs? N -  Dressing or bathing? N -  Doing errands, shopping? N -  Preparing Food and eating ? N -  Using the Toilet? N -  In the past six months, have you accidently leaked urine? N -  Do you have problems with loss of bowel control? N -  Managing your Medications? N -  Managing your Finances? Y -   Housekeeping or managing your Housekeeping? N -    Fall/Depression Screening:  PHQ 2/9 Scores 09/03/2014 08/22/2014  PHQ - 2 Score 0 0    Assessment:   Previous documentation in [facility] record.  CSW reviewed chart on client on 04/06/15.  CSW updated CSW chart information on client in [place] on 04/06/15.Marland Kitchen  Client has experienced some stress issues related to ongoing care needs of client's wife.  Client is supportive to the care needs of his wife. Client has some support for client from his daughter and from his son. Client has transportation to needed medical appointments.  Client has some financial challenges and has been working for several months to develop and follow budget plan for client. Medication costs for client have been high and client has been challenges to pay for prescribed medications.    Plan:  Client to take medications as prescribed and to attend scheduled medical appointments. Client to continue to work towards completion of care plan goals of client. Client to communicate with primary doctor of client to discuss medical needs of client. CSW to call client next week to assess needs of client at that time.   [name].Kippy Gohman MSW, LCSW Licensed Clinical Social Worker [facility] Care Management [phone]

## 2015-04-08 ENCOUNTER — Encounter: Payer: Self-pay | Admitting: Licensed Clinical Social Worker

## 2015-04-08 NOTE — Patient Outreach (Signed)
  [facility]) Care Management  [facility] Social Work  04/08/2015  IVAAN LIDDY [birth year] [id number]  Subjective:    Objective:   Current Medications:  Current Outpatient Prescriptions  Medication Sig Dispense Refill  . ACCU-CHEK COMPACT PLUS test strip USE AS DIRECTED TO TEST BLOOD GLUCOSE FOUR TIMES A DAY 100 each 5  . acetaminophen (TYLENOL) 325 MG tablet Take 2 tablets (650 mg total) by mouth every 4 (four) hours as needed for headache or mild pain.    Marland Kitchen aspirin 81 MG tablet Take 81 mg by mouth daily.     Marland Kitchen atenolol (TENORMIN) 100 MG tablet TAKE ONE TABLET BY MOUTH ONCE DAILY 90 tablet 1  . glipiZIDE (GLUCOTROL) 5 MG tablet Take 0.5 tablets (2.5 mg total) by mouth 2 (two) times daily before a meal. 30 tablet 5  . ibuprofen (ADVIL,MOTRIN) 200 MG tablet Take 600 mg by mouth daily as needed (pain).    Marland Kitchen lisinopril (PRINIVIL,ZESTRIL) 10 MG tablet TAKE ONE TABLET BY MOUTH ONCE DAILY **NEEDS OFFICE VISIT** 30 tablet 2  . lovastatin (MEVACOR) 20 MG tablet Take 20 mg by mouth. Take one half tablet mon, wed, and friday    . nitroGLYCERIN (NITROSTAT) 0.4 MG SL tablet Place 1 tablet (0.4 mg total) under the tongue every 5 (five) minutes as needed for chest pain. 30 tablet 0  . prasugrel (EFFIENT) 10 MG TABS tablet Take 1 tablet (10 mg total) by mouth daily. 30 tablet 11   No current facility-administered medications for this visit.    Functional Status:  In your present state of health, do you have any difficulty performing the following activities: 04/06/2015 08/22/2014  Hearing? N -  Vision? N Y  Difficulty concentrating or making decisions? N -  Walking or climbing stairs? N -  Dressing or bathing? N -  Doing errands, shopping? N -  Preparing Food and eating ? N -  Using the Toilet? N -  In the past six months, have you accidently leaked urine? N -  Do you have problems with loss of bowel control? N -  Managing your Medications? N -  Managing your Finances? Y -   Housekeeping or managing your Housekeeping? N -    Fall/Depression Screening:  PHQ 2/9 Scores 09/03/2014 08/22/2014  PHQ - 2 Score 0 0    Assessment:   Previous documentation in [facility] record.  CSW updated care plan documentation section for client in [place] on 04/08/15.    Client has not been working towards his care plan goals.  Though CSW has talked with client about these goals, client has not been working to reach goals set for client through client's care plan. Client has discussed care plan goals with CSW but then client does not take any action or show any motivation to work to reach any care plan goals for client.   Plan: CSW to call client within the next week to discuss care plan goals for client . Client to take medications as prescribed and to attend scheduled medical appointments.   [name].Taysha Majewski MSW, LCSW Licensed Clinical Social Worker [facility] Care Management [phone]

## 2015-04-09 ENCOUNTER — Other Ambulatory Visit: Payer: Self-pay | Admitting: Licensed Clinical Social Worker

## 2015-04-09 ENCOUNTER — Encounter: Payer: Self-pay | Admitting: Licensed Clinical Social Worker

## 2015-04-09 NOTE — Patient Outreach (Signed)
   Assessment: Previous documentation in [facility] record. CSW called home phone number for client on 04/09/15.  CSW spoke via phone with client on 04/09/15.  CSW verified identity of client. CSW and client spoke of [facility] program support and of the current needs of client. Related to ongoing past care plan issues, CSW spoke with Peter [name] about his medication cost issues. Peter [name] said that he was able to get effient medication through the heart clinic in [place], [place] (samples).  He said because he was able to get that particular medication through the heart clinic in [place], [place], he was then able to buy his other needed monthly prescribed medications.  He said he did have current medications prescribed.  He did not mention any plans to further check into LIS (low income subsidy application) process.  He did not mention any plans to develop a monthly medication budget.  He said he drives himself to most medical appointments. He said he has some support as needed from family members. He said he continues to care for the ongoing medical and daily needs of his wife.  He said he is attending scheduled medical appointments with his primary care physician; also, he attends scheduled medical appointments at local heart clinic in [place], [place].  [place] asked client about current social work needs of client. Client said he thought his current social work needs were met at present.  CSW informed client that since client's social work needs were met at present, that [place] would discharge client, close case for client for [facility] clinical social work services on 04/09/15.  Also, CSW informed Peter [name] that CSW would send letter to Peter [name], primary doctor of client, to inform Dr. Wolfgang Phoenix that [place] discharged client from social work services on 04/09/15 due to fact that social work goals for client had been met.  Client, Peter [name], agreed to discharge from [facility] work services. Peter [name] agreed for CSW to send letter to Peter [name]  informing Peter [name] of client discharge from [facility] thanked Peter [name] for phone call. Peter [name] was appreciative to [place] for support of CSW through the [facility] program over the past months.  Plan:  CSW is discharging Peter [name]. [place] from [place] work services on 04/09/15 due to fact that social work goals for client have been met. CSW to inform [name] on 04/09/15 that CSW discharged client from clinical social work services on 04/09/15 due to client's having met social work goals for client  [name].Catarina Huntley MSW, LCSW Licensed Clinical Social Worker [facility] Care Management [phone]

## 2015-04-13 NOTE — Patient Outreach (Signed)
[facility]) Care Management  04/13/2015  Peter [name] 21-May-1941 [id number]   Received notification from [name], [name] to close case due to patient met all SW goals with [facility] Care Management.  [name]. [place] CM Assistant Phone: [phone] Fax: [phone]

## 2015-05-14 ENCOUNTER — Telehealth: Payer: Self-pay | Admitting: *Deleted

## 2015-05-14 DIAGNOSIS — I714 Abdominal aortic aneurysm, without rupture, unspecified: Secondary | ICD-10-CM

## 2015-05-14 NOTE — Telephone Encounter (Signed)
appt June 17th register 2pm.

## 2015-05-14 NOTE — Telephone Encounter (Signed)
Pt notified of appt 

## 2015-05-14 NOTE — Telephone Encounter (Signed)
Pt in [place] file for repeat aorta U/S to be done after June 14th. Dx. Abdominal aortic aneurysm surveillance. Order for test put in epic.

## 2015-05-16 ENCOUNTER — Other Ambulatory Visit: Payer: Self-pay | Admitting: Family Medicine

## 2015-05-18 ENCOUNTER — Ambulatory Visit (INDEPENDENT_AMBULATORY_CARE_PROVIDER_SITE_OTHER): Payer: Medicare Other | Admitting: Nurse Practitioner

## 2015-05-18 ENCOUNTER — Telehealth: Payer: Self-pay | Admitting: Family Medicine

## 2015-05-18 ENCOUNTER — Encounter: Payer: Self-pay | Admitting: Nurse Practitioner

## 2015-05-18 VITALS — BP 140/90 | Temp 97.4°F | Ht 72.0 in | Wt 195.0 lb

## 2015-05-18 DIAGNOSIS — J310 Chronic rhinitis: Secondary | ICD-10-CM

## 2015-05-18 DIAGNOSIS — J329 Chronic sinusitis, unspecified: Secondary | ICD-10-CM

## 2015-05-18 MED ORDER — AMOXICILLIN 500 MG PO CAPS: 500.0000 mg | ORAL_CAPSULE | Freq: Three times a day (TID) | ORAL | Status: DC

## 2015-05-18 NOTE — Telephone Encounter (Signed)
ERROR

## 2015-05-19 ENCOUNTER — Encounter: Payer: Self-pay | Admitting: Nurse Practitioner

## 2015-05-19 NOTE — Progress Notes (Signed)
Subjective:  Presents for complaints of head congestion for the past 2 days. No fever. Sinus pressure with slight headache. Sore throat. No cough or pain wheezing. Took 2 amoxicillin that he had at home which helped his symptoms.  Objective:   BP 140/90 mmHg  Temp(Src) 97.4 F (36.3 C) (Oral)  Ht 6' (1.829 m)  Wt 195 lb (88.451 kg)  BMI 26.44 kg/m2 NAD. Alert, oriented. TMs mild clear effusion, no erythema. Pharynx mildly erythematous with PND noted. Neck supple with mild soft anterior adenopathy. Lungs clear. Heart regular rate rhythm.  Assessment: Rhinosinusitis  Plan:  Meds ordered this encounter  Medications  . amoxicillin (AMOXIL) 500 MG capsule    Sig: Take 1 capsule (500 mg total) by mouth 3 (three) times daily.    Dispense:  30 capsule    Refill:  0    Order Specific Question:  Supervising Provider    Answer:  Mikey Kirschner [[id number]]   OTC meds as directed for congestion. Call back if worsens or persists. Follow-up with Dr. Nicki Reaper next week as planned.

## 2015-05-22 ENCOUNTER — Ambulatory Visit (HOSPITAL_COMMUNITY)
Admission: RE | Admit: 2015-05-22 | Discharge: 2015-05-22 | Disposition: A | Discharge status: Home / Self Care | Payer: Medicare Other | Source: Ambulatory Visit | Attending: Family Medicine | Admitting: Family Medicine

## 2015-05-22 DIAGNOSIS — I723 Aneurysm of iliac artery: Secondary | ICD-10-CM | POA: Diagnosis not present

## 2015-05-22 DIAGNOSIS — I714 Abdominal aortic aneurysm, without rupture: Secondary | ICD-10-CM | POA: Insufficient documentation

## 2015-05-22 DIAGNOSIS — Z87891 Personal history of nicotine dependence: Secondary | ICD-10-CM | POA: Insufficient documentation

## 2015-05-22 DIAGNOSIS — E780 Pure hypercholesterolemia: Secondary | ICD-10-CM | POA: Insufficient documentation

## 2015-05-22 DIAGNOSIS — I10 Essential (primary) hypertension: Secondary | ICD-10-CM | POA: Diagnosis not present

## 2015-05-22 DIAGNOSIS — I2510 Atherosclerotic heart disease of native coronary artery without angina pectoris: Secondary | ICD-10-CM | POA: Insufficient documentation

## 2015-05-22 DIAGNOSIS — E119 Type 2 diabetes mellitus without complications: Secondary | ICD-10-CM | POA: Diagnosis not present

## 2015-05-28 DIAGNOSIS — D649 Anemia, unspecified: Secondary | ICD-10-CM | POA: Diagnosis not present

## 2015-05-28 DIAGNOSIS — I10 Essential (primary) hypertension: Secondary | ICD-10-CM | POA: Diagnosis not present

## 2015-05-28 DIAGNOSIS — E785 Hyperlipidemia, unspecified: Secondary | ICD-10-CM | POA: Diagnosis not present

## 2015-05-28 DIAGNOSIS — E119 Type 2 diabetes mellitus without complications: Secondary | ICD-10-CM | POA: Diagnosis not present

## 2015-05-29 LAB — CBC WITH DIFFERENTIAL/PLATELET
Basophils Absolute: 0 10*3/uL (ref 0.0–0.2)
Basos: 0 %
EOS (ABSOLUTE): 0.3 10*3/uL (ref 0.0–0.4)
Eos: 4 %
Hematocrit: 43.5 % (ref 37.5–51.0)
Hemoglobin: 15 g/dL (ref 12.6–17.7)
Immature Grans (Abs): 0 10*3/uL (ref 0.0–0.1)
Immature Granulocytes: 0 %
Lymphocytes Absolute: 2.8 10*3/uL (ref 0.7–3.1)
Lymphs: 35 %
MCH: 32.3 pg (ref 26.6–33.0)
MCHC: 34.5 g/dL (ref 31.5–35.7)
MCV: 94 fL (ref 79–97)
Monocytes Absolute: 0.5 10*3/uL (ref 0.1–0.9)
Monocytes: 6 %
Neutrophils Absolute: 4.3 10*3/uL (ref 1.4–7.0)
Neutrophils: 55 %
Platelets: 147 10*3/uL — ABNORMAL LOW (ref 150–379)
RBC: 4.65 x10E6/uL (ref 4.14–5.80)
RDW: 13.7 % (ref 12.3–15.4)
WBC: 8 10*3/uL (ref 3.4–10.8)

## 2015-05-29 LAB — LIPID PANEL
Chol/HDL Ratio: 6.7 ratio units — ABNORMAL HIGH (ref 0.0–5.0)
Cholesterol, Total: 209 mg/dL — ABNORMAL HIGH (ref 100–199)
HDL: 31 mg/dL — ABNORMAL LOW (ref >39)
LDL Calculated: 147 mg/dL — ABNORMAL HIGH (ref 0–99)
Triglycerides: 155 mg/dL — ABNORMAL HIGH (ref 0–149)
VLDL Cholesterol Cal: 31 mg/dL (ref 5–40)

## 2015-05-29 LAB — BASIC METABOLIC PANEL
BUN/Creatinine Ratio: 11 (ref 10–22)
BUN: 12 mg/dL (ref 8–27)
CO2: 23 mmol/L (ref 18–29)
Calcium: 9.3 mg/dL (ref 8.6–10.2)
Chloride: 98 mmol/L (ref 97–108)
Creatinine, Ser: 1.12 mg/dL (ref 0.76–1.27)
GFR calc Af Amer: 75 mL/min/{1.73_m2} (ref >59)
GFR calc non Af Amer: 65 mL/min/{1.73_m2} (ref >59)
Glucose: 177 mg/dL — ABNORMAL HIGH (ref 65–99)
Potassium: 4.7 mmol/L (ref 3.5–5.2)
Sodium: 138 mmol/L (ref 134–144)

## 2015-05-29 LAB — MICROALBUMIN, URINE: Microalbumin, Urine: 3.4 ug/mL

## 2015-06-01 ENCOUNTER — Encounter: Payer: Self-pay | Admitting: Family Medicine

## 2015-06-01 ENCOUNTER — Ambulatory Visit (INDEPENDENT_AMBULATORY_CARE_PROVIDER_SITE_OTHER): Payer: Medicare Other | Admitting: Family Medicine

## 2015-06-01 VITALS — BP 130/90 | Ht 72.0 in | Wt 193.5 lb

## 2015-06-01 DIAGNOSIS — I10 Essential (primary) hypertension: Secondary | ICD-10-CM | POA: Diagnosis not present

## 2015-06-01 DIAGNOSIS — I714 Abdominal aortic aneurysm, without rupture, unspecified: Secondary | ICD-10-CM

## 2015-06-01 DIAGNOSIS — E785 Hyperlipidemia, unspecified: Secondary | ICD-10-CM

## 2015-06-01 DIAGNOSIS — E119 Type 2 diabetes mellitus without complications: Secondary | ICD-10-CM

## 2015-06-01 LAB — POCT GLYCOSYLATED HEMOGLOBIN (HGB A1C): Hemoglobin A1C: 7

## 2015-06-01 MED ORDER — METFORMIN HCL 500 MG PO TABS: 500.0000 mg | ORAL_TABLET | Freq: Two times a day (BID) | ORAL | Status: DC

## 2015-06-01 MED ORDER — AMOXICILLIN 500 MG PO CAPS: 500.0000 mg | ORAL_CAPSULE | Freq: Three times a day (TID) | ORAL | Status: DC

## 2015-06-01 MED ORDER — LISINOPRIL 10 MG PO TABS: ORAL_TABLET | ORAL | Status: DC

## 2015-06-01 NOTE — Patient Instructions (Signed)
Plantar Fasciitis  Plantar fasciitis is a common condition that causes foot pain. It is soreness (inflammation) of the band of tough fibrous tissue on the bottom of the foot that runs from the heel bone (calcaneus) to the ball of the foot. The cause of this soreness may be from excessive standing, poor fitting shoes, running on hard surfaces, being overweight, having an abnormal walk, or overuse (this is common in runners) of the painful foot or feet. It is also common in aerobic exercise dancers and ballet dancers.  SYMPTOMS   Most people with plantar fasciitis complain of:   Severe pain in the morning on the bottom of their foot especially when taking the first steps out of bed. This pain recedes after a few minutes of walking.   Severe pain is experienced also during walking following a long period of inactivity.   Pain is worse when walking barefoot or up stairs  DIAGNOSIS    Your caregiver will diagnose this condition by examining and feeling your foot.   Special tests such as X-rays of your foot, are usually not needed.  PREVENTION    Consult a sports medicine professional before beginning a new exercise program.   Walking programs offer a good workout. With walking there is a lower chance of overuse injuries common to runners. There is less impact and less jarring of the joints.   Begin all new exercise programs slowly. If problems or pain develop, decrease the amount of time or distance until you are at a comfortable level.   Wear good shoes and replace them regularly.   Stretch your foot and the heel cords at the back of the ankle (Achilles tendon) both before and after exercise.   Run or exercise on even surfaces that are not hard. For example, asphalt is better than pavement.   Do not run barefoot on hard surfaces.   If using a treadmill, vary the incline.   Do not continue to workout if you have foot or joint problems. Seek professional help if they do not improve.  HOME CARE INSTRUCTIONS     Avoid activities that cause you pain until you recover.   Use ice or cold packs on the problem or painful areas after working out.   Only take over-the-counter or prescription medicines for pain, discomfort, or fever as directed by your caregiver.   Soft shoe inserts or athletic shoes with air or gel sole cushions may be helpful.   If problems continue or become more severe, consult a sports medicine caregiver or your own health care provider. Cortisone is a potent anti-inflammatory medication that may be injected into the painful area. You can discuss this treatment with your caregiver.  MAKE SURE YOU:    Understand these instructions.   Will watch your condition.   Will get help right away if you are not doing well or get worse.  Document Released: 08/16/2001 Document Revised: 02/13/2012 Document Reviewed: 10/15/2008  ExitCare Patient Information 2015 [employer]. This information is not intended to replace advice given to you by your health care provider. Make sure you discuss any questions you have with your health care provider.

## 2015-06-01 NOTE — Progress Notes (Signed)
   Subjective:    Patient ID: Peter [name], male    DOB: Oct 01, 1941, 74 y.o.   MRN: [id number]  Diabetes He presents for his follow-up diabetic visit. He has type 2 diabetes mellitus. There are no hypoglycemic associated symptoms. Pertinent negatives for hypoglycemia include no confusion. There are no diabetic associated symptoms. Pertinent negatives for diabetes include no chest pain, no fatigue, no polydipsia, no polyphagia and no weakness. There are no hypoglycemic complications. There are no diabetic complications. There are no known risk factors for coronary artery disease. Current diabetic treatment includes oral agent (monotherapy). He is compliant with treatment all of the time.   Patient wants a 90 day supply of his lisinopril.   Patient states he has no other concerns at this time.  I discussed the patient's cholesterol issues with them he stopped taking a medicine because he just really fell he was on too many medicines He also states he stopped taking his side because he is concerned side effects He also states that he did do the ultrasound for the aortic aneurysm area 25 minutes spent with patient greater than half in discussion Review of Systems  Constitutional: Negative for activity change, appetite change and fatigue.  HENT: Negative for congestion.   Respiratory: Negative for cough.   Cardiovascular: Negative for chest pain.  Gastrointestinal: Negative for abdominal pain.  Endocrine: Negative for polydipsia and polyphagia.  Neurological: Negative for weakness.  Psychiatric/Behavioral: Negative for confusion.       Objective:   Physical Exam  Constitutional: He appears well-nourished. No distress.  Cardiovascular: Normal rate, regular rhythm and normal heart sounds.   No murmur heard. Pulmonary/Chest: Effort normal and breath sounds normal. No respiratory distress.  Musculoskeletal: He exhibits no edema.  Lymphadenopathy:    He has no cervical adenopathy.    Neurological: He is alert.  Psychiatric: His behavior is normal.  Vitals reviewed.         Assessment & Plan:  diabetes-fair control currently continue current measures patient admits that he did not take the glipizide he wants to go back metformin and in fact he restarted therefore metformin 500 mg 1 twice a day  hyperlipidemia-patient has not been taking his medication I talked to him at length of the importance of taking it I recommend we increase the dose 40 mg daily. He does not want to go on Lipitor or Crestor. Repeat lipid liver profile along with A1c in 3 months  HTN good control continue current measures  Aortic aneurysm stable repeat it in 6 months

## 2015-06-09 ENCOUNTER — Ambulatory Visit (INDEPENDENT_AMBULATORY_CARE_PROVIDER_SITE_OTHER): Payer: Medicare Other | Admitting: Adult Health

## 2015-06-09 ENCOUNTER — Encounter: Payer: Self-pay | Admitting: Adult Health

## 2015-06-09 VITALS — BP 136/80 | HR 64 | Ht 72.0 in | Wt 192.0 lb

## 2015-06-09 DIAGNOSIS — I2510 Atherosclerotic heart disease of native coronary artery without angina pectoris: Secondary | ICD-10-CM

## 2015-06-09 DIAGNOSIS — E7800 Pure hypercholesterolemia, unspecified: Secondary | ICD-10-CM

## 2015-06-09 NOTE — Progress Notes (Signed)
Cardiology Office Note   Date:  06/09/2015   ID:  Peter [name], DOB [birth year], MRN [id number]  PCP:  Sallee [place], MD  Cardiologist:  Cloria Spring, NP   Chief Complaint  Patient presents with  . Coronary Artery Disease  . Hypertension      History of Present Illness: Peter [name] is a 74 y.o. male who presents for ongoing assessment and management of CAD, with hx of CABG, PCI in 2003 and 2008, with most recent cardiac cath demonstrating 90% stenosis just beyond the graft insertion site of the LIMA to LAD, with DES placed in 2015. He was last seen in Jan of 2016. He wishes to have teeth pulled due to several broken teeth. He was advised to wait 6 month until doing so.   He went ahead and had his teeth pulled and held the Effient for one day. He states he has been doing well, but does not want to take statins. He states that he does not want to be on so many medications. He has seen his PCP who was concerned that his LDL was >140 and has asked him to take statin medication but he is adamant in his refusal to do do. He denies chest pain or DOE. Some "burning with exertion" but not enough to take NTG SL.   Past Medical History  Diagnosis Date  . Myocardial infarction 1997    stents x2 (2003/2007)  . AAA (abdominal aortic aneurysm)   . Hypertension   . CAD (coronary artery disease)     CABG '97, multiple PCIs  . Dyslipidemia   . Diabetes mellitus     diet controlled  . COPD (chronic obstructive pulmonary disease)     Past Surgical History  Procedure Laterality Date  . Cystectomy  2005    top of head-[place]  . Coronary angioplasty  2003/2007    2 stents  . 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 Ear cyst excision  07/09/2012    Procedure: CYST REMOVAL;  Surgeon: Jamesetta So, MD;  Location: [facility];  Service: General;  Laterality: N/A;  . Coronary angioplasty with stent placement  08/14/14    DES to LIMA-LAD insertion  . Coronary stent placement   08/15/2014    MID LAD  DES  by Dr Burt Knack  . Left heart catheterization with coronary/graft angiogram N/A 08/14/2014    Procedure: LEFT HEART CATHETERIZATION WITH Beatrix Fetters;  Surgeon: Blane Ohara, MD;  Location: [facility] CATH LAB;  Service: Cardiovascular;  Laterality: N/A;     Current Outpatient Prescriptions  Medication Sig Dispense Refill  . ACCU-CHEK COMPACT PLUS test strip USE AS DIRECTED TO TEST BLOOD GLUCOSE FOUR TIMES A DAY 100 each 5  . acetaminophen (TYLENOL) 325 MG tablet Take 2 tablets (650 mg total) by mouth every 4 (four) hours as needed for headache or mild pain.    Marland Kitchen amoxicillin (AMOXIL) 500 MG capsule Take 1 capsule (500 mg total) by mouth 3 (three) times daily. 30 capsule 0  . aspirin 81 MG tablet Take 81 mg by mouth daily.     Marland Kitchen atenolol (TENORMIN) 100 MG tablet TAKE ONE TABLET BY MOUTH ONCE DAILY 90 tablet 0  . ibuprofen (ADVIL,MOTRIN) 200 MG tablet Take 600 mg by mouth daily as needed (pain).    Marland Kitchen lisinopril (PRINIVIL,ZESTRIL) 10 MG tablet TAKE ONE TABLET BY MOUTH ONCE DAILY 90 tablet 3  . lovastatin (MEVACOR) 20 MG tablet Take 20 mg  by mouth. Take one half tablet mon, wed, and friday    . metFORMIN (GLUCOPHAGE) 500 MG tablet Take 1 tablet (500 mg total) by mouth 2 (two) times daily with a meal. 180 tablet 3  . nitroGLYCERIN (NITROSTAT) 0.4 MG SL tablet Place 1 tablet (0.4 mg total) under the tongue every 5 (five) minutes as needed for chest pain. 30 tablet 0  . prasugrel (EFFIENT) 10 MG TABS tablet Take 1 tablet (10 mg total) by mouth daily. 30 tablet 11   No current facility-administered medications for this visit.    Allergies:   Dye fdc red; Ivp dye; Plavix; and Metformin and related    Social History:  The patient  reports that he quit smoking about 14 years ago. His smoking use included Cigarettes. He has a 120 pack-year smoking history. He has never used smokeless tobacco. He reports that he does not drink alcohol or use illicit drugs.   Family  History:  The patient's family history includes Heart attack in his mother; Hypertension in his father; Other in his father. There is no history of Colon cancer or Liver disease.    ROS: .   All other systems are reviewed and negative.Unless otherwise mentioned in H&P above.   PHYSICAL EXAM: VS:  There were no vitals taken for this visit. , BMI There is no weight on file to calculate BMI. GEN: Well nourished, well developed, in no acute distress HEENT: normal Neck: no JVD, carotid bruits, or masses Cardiac: RRR; 1/6 systolic murmur  rubs, or gallops,no edema  Respiratory: Clear to auscultation bilaterally, normal work of breathing GI: soft, nontender, nondistended, + BS MS: no deformity or atrophy Skin: warm and dry, no rash Neuro:  Strength and sensation are intact Psych: euthymic mood, full affect  Recent Labs: 08/06/2014: ALT 25 08/15/2014: Hemoglobin 13.0; Platelets 100* 05/28/2015: BUN 12; Creatinine, Ser 1.12; Potassium 4.7; Sodium 138    Lipid Panel    Component Value Date/Time   CHOL 209* 05/28/2015 1120   CHOL 183 08/06/2014 1146   TRIG 155* 05/28/2015 1120   HDL 31* 05/28/2015 1120   HDL 36* 08/06/2014 1146   CHOLHDL 6.7* 05/28/2015 1120   CHOLHDL 5.1 08/06/2014 1146   VLDL 23 08/06/2014 1146   LDLCALC 147* 05/28/2015 1120   LDLCALC 124* 08/06/2014 1146      Wt Readings from Last 3 Encounters:  06/01/15 193 lb 8 oz (87.771 kg)  05/18/15 195 lb (88.451 kg)  03/03/15 192 lb (87.091 kg)      Other studies Reviewed: Additional studies/ records that were reviewed today include: None Review of the above records demonstrates: None   ASSESSMENT AND PLAN:  1. CAD" S/P multiple stents with bypass. He is without compliant I have answered multiple questions concerning his stents and CAD. I have spoken to him and counseled him on need to have statin therapy with his history and need of cholesterol control. He refuses statin.I have asked him to consider Fish Oil which  he states he is willing to take. He is to be mindful of a low cholesterol diet. Will see him again in 6 months unless symptomatic.He is to keep fresh bottle of NTG with him. Continue Effient for another year.   2. Hypercholesterolemia: As documented. Despite counseling and education, he refuses statin therapy.    Current medicines are reviewed at length with the patient today.    Labs/ tests ordered today include: None will request cholesterol labs from Dr. Wolfgang Phoenix. No orders of the defined  types were placed in this encounter.     Disposition:   FU with 6 months.   Signed, Jory Sims, NP  06/09/2015 7:19 AM    [facility] [address] [place], [place] [address] Phone: [phone]; Fax: [phone]

## 2015-06-09 NOTE — Progress Notes (Deleted)
Name: Peter [name]    DOB: [birth year]  Age: 74 y.o.  MR#: [id number]       PCP:  Sallee Lange, MD      Insurance: Payor: [employer] MEDICARE / Plan: [facility] MEDICARE / Product Type: *No Product type* /   CC:    Chief Complaint  Patient presents with  . Coronary Artery Disease  . Hypertension    VS Filed Vitals:   06/09/15 1426  BP: 136/80  Pulse: 64  Height: 6' (1.829 m)  Weight: 192 lb (87.091 kg)  SpO2: 98%    Weights Current Weight  06/09/15 192 lb (87.091 kg)  06/01/15 193 lb 8 oz (87.771 kg)  05/18/15 195 lb (88.451 kg)    Blood Pressure  BP Readings from Last 3 Encounters:  06/09/15 136/80  06/01/15 130/90  05/18/15 140/90     Admit date:  (Not on file) Last encounter with RMR:  03/09/2015   Allergy Dye fdc red; Ivp dye; and Plavix  Current Outpatient Prescriptions  Medication Sig Dispense Refill  . ACCU-CHEK COMPACT PLUS test strip USE AS DIRECTED TO TEST BLOOD GLUCOSE FOUR TIMES A DAY 100 each 5  . acetaminophen (TYLENOL) 325 MG tablet Take 2 tablets (650 mg total) by mouth every 4 (four) hours as needed for headache or mild pain.    Marland Kitchen aspirin 81 MG tablet Take 81 mg by mouth daily.     Marland Kitchen atenolol (TENORMIN) 100 MG tablet TAKE ONE TABLET BY MOUTH ONCE DAILY 90 tablet 0  . ibuprofen (ADVIL,MOTRIN) 200 MG tablet Take 600 mg by mouth daily as needed (pain).    Marland Kitchen lisinopril (PRINIVIL,ZESTRIL) 10 MG tablet TAKE ONE TABLET BY MOUTH ONCE DAILY 90 tablet 3  . metFORMIN (GLUCOPHAGE) 500 MG tablet Take 500 mg by mouth daily with breakfast.    . nitroGLYCERIN (NITROSTAT) 0.4 MG SL tablet Place 1 tablet (0.4 mg total) under the tongue every 5 (five) minutes as needed for chest pain. 30 tablet 0  . prasugrel (EFFIENT) 10 MG TABS tablet Take 1 tablet (10 mg total) by mouth daily. 30 tablet 11   No current facility-administered medications for this visit.    Discontinued Meds:    Medications Discontinued During This Encounter  Medication Reason  . amoxicillin  (AMOXIL) 500 MG capsule Error  . lovastatin (MEVACOR) 20 MG tablet Error  . metFORMIN (GLUCOPHAGE) 500 MG tablet Error    Patient Active Problem List   Diagnosis Date Noted  . 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  PCI- LIMA-LAD insertion site DES 08/14/14 08/15/2014  . COPD (chronic obstructive pulmonary disease) 08/15/2014  . 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 Plavix allergy 08/15/2014  . Exertional angina 08/14/2014  . HTN (hypertension), benign 02/03/2014  . AAA (abdominal aortic aneurysm) without rupture 02/03/2014  . H/O ETOH abuse 04/02/2013  . Bowel habit changes 04/02/2013  . Dyslipidemia 03/13/2013  . Type II diabetes mellitus, well controlled 03/13/2013  . Pain, abdominal, nonspecific 03/13/2013    LABS    Component Value Date/Time   NA 138 05/28/2015 1120   NA 139 08/15/2014 0400   NA 138 08/06/2014 1146   NA 136* 07/31/2014 1210   K 4.7 05/28/2015 1120   K 3.8 08/15/2014 0400   K 4.5 08/06/2014 1146   CL 98 05/28/2015 1120   CL 104 08/15/2014 0400   CL 100 08/06/2014 1146   CO2 23 05/28/2015 1120   CO2 23 08/15/2014 0400   CO2 27 08/06/2014  1146   GLUCOSE 177* 05/28/2015 1120   GLUCOSE 185* 08/15/2014 0400   GLUCOSE 134* 08/06/2014 1146   GLUCOSE 176* 07/31/2014 1210   BUN 12 05/28/2015 1120   BUN 18 08/15/2014 0400   BUN 12 08/06/2014 1146   BUN 16 07/31/2014 1210   CREATININE 1.12 05/28/2015 1120   CREATININE 1.15 08/15/2014 0400   CREATININE 1.05 08/06/2014 1146   CREATININE 1.06 07/31/2014 1210   CREATININE 1.10 02/05/2014 1000   CREATININE 1.14 03/13/2013 1158   CALCIUM 9.3 05/28/2015 1120   CALCIUM 8.3* 08/15/2014 0400   CALCIUM 9.0 08/06/2014 1146   GFRNONAA 65 05/28/2015 1120   GFRNONAA 62* 08/15/2014 0400   GFRNONAA 68* 07/31/2014 1210   GFRAA 75 05/28/2015 1120   GFRAA 72* 08/15/2014 0400   GFRAA 79* 07/31/2014 1210   CMP     Component Value Date/Time   NA 138 05/28/2015 1120   NA 139 08/15/2014 0400   K 4.7 05/28/2015  1120   CL 98 05/28/2015 1120   CO2 23 05/28/2015 1120   GLUCOSE 177* 05/28/2015 1120   GLUCOSE 185* 08/15/2014 0400   BUN 12 05/28/2015 1120   BUN 18 08/15/2014 0400   CREATININE 1.12 05/28/2015 1120   CREATININE 1.05 08/06/2014 1146   CALCIUM 9.3 05/28/2015 1120   PROT 7.1 08/06/2014 1146   ALBUMIN 4.1 08/06/2014 1146   AST 26 08/06/2014 1146   ALT 25 08/06/2014 1146   ALKPHOS 61 08/06/2014 1146   BILITOT 0.9 08/06/2014 1146   GFRNONAA 65 05/28/2015 1120   GFRAA 75 05/28/2015 1120       Component Value Date/Time   WBC 8.0 05/28/2015 1120   WBC 10.7* 08/15/2014 0400   WBC 12.4* 08/14/2014 1108   WBC 6.2 07/31/2014 1210   HGB 13.0 08/15/2014 0400   HGB 14.5 08/14/2014 1108   HGB 15.0 07/31/2014 1210   HCT 43.5 05/28/2015 1120   HCT 36.7* 08/15/2014 0400   HCT 40.4 08/14/2014 1108   HCT 41.7 07/31/2014 1210   MCV 96.3 08/15/2014 0400   MCV 92.9 08/14/2014 1108   MCV 94.8 07/31/2014 1210    Lipid Panel     Component Value Date/Time   CHOL 209* 05/28/2015 1120   CHOL 183 08/06/2014 1146   TRIG 155* 05/28/2015 1120   HDL 31* 05/28/2015 1120   HDL 36* 08/06/2014 1146   CHOLHDL 6.7* 05/28/2015 1120   CHOLHDL 5.1 08/06/2014 1146   VLDL 23 08/06/2014 1146   LDLCALC 147* 05/28/2015 1120   LDLCALC 124* 08/06/2014 1146    ABG No results found for: PHART, PCO2ART, PO2ART, HCO3, TCO2, ACIDBASEDEF, O2SAT   No results found for: TSH BNP (last 3 results) No results for input(s): BNP in the last 8760 hours.  ProBNP (last 3 results) No results for input(s): PROBNP in the last 8760 hours.  Cardiac Panel (last 3 results) No results for input(s): CKTOTAL, CKMB, TROPONINI, RELINDX in the last 72 hours.  Iron/TIBC/Ferritin/ %Sat No results found for: IRON, TIBC, FERRITIN, IRONPCTSAT   EKG Orders placed or performed in visit on 08/20/14  . EKG 12-Lead     Prior Assessment and Plan Problem List as of 06/09/2015    Dyslipidemia   Last Assessment & Plan 09/01/2014 Office  Visit Written 09/01/2014  2:41 PM by Lendon Colonel, NP    In order save him money, I have changed his atorvastatin to lovastatin 20 mg daily. Follow up labs in 3 months.       Type II  diabetes mellitus, well controlled   Pain, abdominal, nonspecific   H/O ETOH abuse   Bowel habit changes   Last Assessment & Plan 04/02/2013 Office Visit Written 04/02/2013 11:19 AM by Mahala Menghini, PA-C    74 y/o male with recent bowel habit change. Reports new onset constipation when he stopped consuming etoh 01/2013. Previously had loose stool. Denies brbpr, melena. H/O intermittent etoh abuse over past five years but with periods of no etoh use for 6 months at a time. Previously with some vague abdominal discomfort but states this has resolved. GI symptoms possibly related to etoh gastritis/colopathy but cannot exclude pancreatitic etiology. Denies GERD symptoms. Did not take the PPI recommended by Dr. Wolfgang Phoenix a few weeks ago. Tried some "home remedies." Discussed the recommendations of pursing colonoscopy for bowel changes which could be secondary to d/c etoh use but need to exclude malignancy. Discussed risks at length with patient including pros/cons of procedure. He would like to think about it before agreeing to scheduling a colonoscopy. He has also requesting to know expense of procedure and our office staff will help him with that.   He will call us and let us know what he decides.   If he has recurrent abdominal pain, would consider CT A/P at that time. He will continue to have u/s surveillance of his AAA with Dr. Wolfgang Phoenix as planned.      HTN (hypertension), benign   Last Assessment & Plan 12/11/2014 Office Visit Written 12/11/2014  1:36 PM by Lendon Colonel, NP     Blood pressure is well controlled. No changes in medication regimen at this time. 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 He is feeling better and is compliant with his medications, He is concerned with the cost of the antiplatelet medications. I will give him samples of Effient, he is given paperwork to fill out for reduced cost Rx. He is advised to begin cardiac rehab. He otherwise will stay on current regimen. Will see him again in 3 months.        PCI- LIMA-LAD insertion site DES 08/14/14   Last Assessment & Plan 12/11/2014 Office Visit Written 12/11/2014  1:36 PM by Lendon Colonel, NP    I have explained to him that he should remain on Effient for a minimum of one year, if surgery is indicated, usually wait for 6 months before stopping temporarily, He is anxious to have teeth pulled due to broken teeth. I have asked him to discuss this with oral surgeon concerning his preference about stopping Effient or doing extractions on this medication. It is our recommendation that he wait until March of 2016 for elective oral surgery. He verbalizes understanding. I have given him samples of Effient.       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  COMPARISON:  12/18/2014  FINDINGS: Abdominal Aorta  Aneurysmal dilatation of mid abdominal aorta extending to bifurcation. Associated scattered atherosclerotic changes. Common iliac arteries are aneurysmal at 2.0 cm greatest diameter RIGHT and 1.6 cm diameter LEFT.  Maximum AP  Diameter:  4.0 cm  Maximum TRV  Diameter: 4.1 cm  IMPRESSION: Mid  abdominal aortic aneurysm extending to aortic bifurcation, maximal dimensions of 4.0 x 4.1 cm, not significantly changed from the 4.0 x 4.0 cm identified on the previous exam.  Common iliac artery aneurysmal dilatation bilaterally, 2.0 cm RIGHT and 1.6 cm LEFT.   Electronically Signed   By: Lavonia Dana M.D.   On: 05/22/2015 15:23

## 2015-06-09 NOTE — Progress Notes (Signed)
Name: Peter [name]    DOB: [birth year]  Age: 74 y.o.  MR#: [id number]       PCP:  Sallee Lange, MD      Insurance: Payor: [employer] MEDICARE / Plan: [facility] MEDICARE / Product Type: *No Product type* /   CC:    Chief Complaint  Patient presents with  . Coronary Artery Disease  . Hypertension    VS Filed Vitals:   06/09/15 1426  BP: 136/80  Pulse: 64  Height: 6' (1.829 m)  Weight: 192 lb (87.091 kg)  SpO2: 98%    Weights Current Weight  06/09/15 192 lb (87.091 kg)  06/01/15 193 lb 8 oz (87.771 kg)  05/18/15 195 lb (88.451 kg)    Blood Pressure  BP Readings from Last 3 Encounters:  06/09/15 136/80  06/01/15 130/90  05/18/15 140/90     Admit date:  (Not on file) Last encounter with RMR:  03/09/2015   Allergy Dye fdc red; Ivp dye; and Plavix  Current Outpatient Prescriptions  Medication Sig Dispense Refill  . ACCU-CHEK COMPACT PLUS test strip USE AS DIRECTED TO TEST BLOOD GLUCOSE FOUR TIMES A DAY 100 each 5  . acetaminophen (TYLENOL) 325 MG tablet Take 2 tablets (650 mg total) by mouth every 4 (four) hours as needed for headache or mild pain.    Marland Kitchen aspirin 81 MG tablet Take 81 mg by mouth daily.     Marland Kitchen atenolol (TENORMIN) 100 MG tablet TAKE ONE TABLET BY MOUTH ONCE DAILY 90 tablet 0  . ibuprofen (ADVIL,MOTRIN) 200 MG tablet Take 600 mg by mouth daily as needed (pain).    Marland Kitchen lisinopril (PRINIVIL,ZESTRIL) 10 MG tablet TAKE ONE TABLET BY MOUTH ONCE DAILY 90 tablet 3  . metFORMIN (GLUCOPHAGE) 500 MG tablet Take 500 mg by mouth daily with breakfast.    . nitroGLYCERIN (NITROSTAT) 0.4 MG SL tablet Place 1 tablet (0.4 mg total) under the tongue every 5 (five) minutes as needed for chest pain. 30 tablet 0  . prasugrel (EFFIENT) 10 MG TABS tablet Take 1 tablet (10 mg total) by mouth daily. 30 tablet 11   No current facility-administered medications for this visit.    Discontinued Meds:    Medications Discontinued During This Encounter  Medication Reason  . amoxicillin  (AMOXIL) 500 MG capsule Error  . lovastatin (MEVACOR) 20 MG tablet Error  . metFORMIN (GLUCOPHAGE) 500 MG tablet Error    Patient Active Problem List   Diagnosis Date Noted  . 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  PCI- LIMA-LAD insertion site DES 08/14/14 08/15/2014  . COPD (chronic obstructive pulmonary disease) 08/15/2014  . 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 Plavix allergy 08/15/2014  . Exertional angina 08/14/2014  . HTN (hypertension), benign 02/03/2014  . AAA (abdominal aortic aneurysm) without rupture 02/03/2014  . H/O ETOH abuse 04/02/2013  . Bowel habit changes 04/02/2013  . Dyslipidemia 03/13/2013  . Type II diabetes mellitus, well controlled 03/13/2013  . Pain, abdominal, nonspecific 03/13/2013    LABS    Component Value Date/Time   NA 138 05/28/2015 1120   NA 139 08/15/2014 0400   NA 138 08/06/2014 1146   NA 136* 07/31/2014 1210   K 4.7 05/28/2015 1120   K 3.8 08/15/2014 0400   K 4.5 08/06/2014 1146   CL 98 05/28/2015 1120   CL 104 08/15/2014 0400   CL 100 08/06/2014 1146   CO2 23 05/28/2015 1120   CO2 23 08/15/2014 0400   CO2 27 08/06/2014  1146   GLUCOSE 177* 05/28/2015 1120   GLUCOSE 185* 08/15/2014 0400   GLUCOSE 134* 08/06/2014 1146   GLUCOSE 176* 07/31/2014 1210   BUN 12 05/28/2015 1120   BUN 18 08/15/2014 0400   BUN 12 08/06/2014 1146   BUN 16 07/31/2014 1210   CREATININE 1.12 05/28/2015 1120   CREATININE 1.15 08/15/2014 0400   CREATININE 1.05 08/06/2014 1146   CREATININE 1.06 07/31/2014 1210   CREATININE 1.10 02/05/2014 1000   CREATININE 1.14 03/13/2013 1158   CALCIUM 9.3 05/28/2015 1120   CALCIUM 8.3* 08/15/2014 0400   CALCIUM 9.0 08/06/2014 1146   GFRNONAA 65 05/28/2015 1120   GFRNONAA 62* 08/15/2014 0400   GFRNONAA 68* 07/31/2014 1210   GFRAA 75 05/28/2015 1120   GFRAA 72* 08/15/2014 0400   GFRAA 79* 07/31/2014 1210   CMP     Component Value Date/Time   NA 138 05/28/2015 1120   NA 139 08/15/2014 0400   K 4.7 05/28/2015  1120   CL 98 05/28/2015 1120   CO2 23 05/28/2015 1120   GLUCOSE 177* 05/28/2015 1120   GLUCOSE 185* 08/15/2014 0400   BUN 12 05/28/2015 1120   BUN 18 08/15/2014 0400   CREATININE 1.12 05/28/2015 1120   CREATININE 1.05 08/06/2014 1146   CALCIUM 9.3 05/28/2015 1120   PROT 7.1 08/06/2014 1146   ALBUMIN 4.1 08/06/2014 1146   AST 26 08/06/2014 1146   ALT 25 08/06/2014 1146   ALKPHOS 61 08/06/2014 1146   BILITOT 0.9 08/06/2014 1146   GFRNONAA 65 05/28/2015 1120   GFRAA 75 05/28/2015 1120       Component Value Date/Time   WBC 8.0 05/28/2015 1120   WBC 10.7* 08/15/2014 0400   WBC 12.4* 08/14/2014 1108   WBC 6.2 07/31/2014 1210   HGB 13.0 08/15/2014 0400   HGB 14.5 08/14/2014 1108   HGB 15.0 07/31/2014 1210   HCT 43.5 05/28/2015 1120   HCT 36.7* 08/15/2014 0400   HCT 40.4 08/14/2014 1108   HCT 41.7 07/31/2014 1210   MCV 96.3 08/15/2014 0400   MCV 92.9 08/14/2014 1108   MCV 94.8 07/31/2014 1210    Lipid Panel     Component Value Date/Time   CHOL 209* 05/28/2015 1120   CHOL 183 08/06/2014 1146   TRIG 155* 05/28/2015 1120   HDL 31* 05/28/2015 1120   HDL 36* 08/06/2014 1146   CHOLHDL 6.7* 05/28/2015 1120   CHOLHDL 5.1 08/06/2014 1146   VLDL 23 08/06/2014 1146   LDLCALC 147* 05/28/2015 1120   LDLCALC 124* 08/06/2014 1146    ABG No results found for: PHART, PCO2ART, PO2ART, HCO3, TCO2, ACIDBASEDEF, O2SAT   No results found for: TSH BNP (last 3 results) No results for input(s): BNP in the last 8760 hours.  ProBNP (last 3 results) No results for input(s): PROBNP in the last 8760 hours.  Cardiac Panel (last 3 results) No results for input(s): CKTOTAL, CKMB, TROPONINI, RELINDX in the last 72 hours.  Iron/TIBC/Ferritin/ %Sat No results found for: IRON, TIBC, FERRITIN, IRONPCTSAT   EKG Orders placed or performed in visit on 08/20/14  . EKG 12-Lead     Prior Assessment and Plan Problem List as of 06/09/2015      Cardiovascular and Mediastinum   HTN (hypertension),  benign   Last Assessment & Plan 12/11/2014 Office Visit Written 12/11/2014  1:36 PM by Lendon Colonel, NP     Blood pressure is well controlled. No changes in medication regimen at this time. 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 He is feeling better and is compliant with his medications, He is concerned with the cost of the antiplatelet medications. I will give him samples of Effient, he is given paperwork to fill out for reduced cost Rx. He is advised to begin cardiac rehab. He otherwise will stay on current regimen. Will see him again in 3 months.       Exertional angina     Respiratory   COPD (chronic obstructive pulmonary disease)     Endocrine   Type II diabetes mellitus, well controlled     Other   Dyslipidemia   Last Assessment & Plan 09/01/2014 Office Visit Written 09/01/2014  2:41 PM by Lendon Colonel, NP    In order save him money, I have changed his atorvastatin to lovastatin 20 mg daily. Follow up labs in 3 months.       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 Reports new onset constipation when he stopped consuming etoh 01/2013. Previously had loose stool. Denies brbpr, melena. H/O intermittent etoh abuse over past five years but with periods of no etoh use for 6 months at a time. Previously with some vague abdominal discomfort but states this has resolved. GI symptoms possibly related to etoh gastritis/colopathy but cannot exclude pancreatitic etiology. Denies GERD symptoms. Did not take the PPI recommended by Dr.  Wolfgang Phoenix a few weeks ago. Tried some "home remedies." Discussed the recommendations of pursing colonoscopy for bowel changes which could be secondary to d/c etoh use but need to exclude malignancy. Discussed risks at length with patient including pros/cons of procedure. He would like to think about it before agreeing to scheduling a colonoscopy. He has also requesting to know expense of procedure and our office staff will help him with that.   He will call us and let us know what he decides.   If he has recurrent abdominal pain, would consider CT A/P at that time. He will continue to have u/s surveillance of his AAA with Dr. Wolfgang Phoenix as planned.       PCI- LIMA-LAD insertion site DES 08/14/14   Last Assessment & Plan 12/11/2014 Office Visit Written 12/11/2014  1:36 PM by Lendon Colonel, NP    I have explained to him that he should remain on Effient for a minimum of one year, if surgery is indicated, usually wait for 6 months before stopping temporarily, He is anxious to have teeth pulled due to broken teeth. I have asked him to discuss this with oral surgeon concerning his preference about stopping Effient or doing extractions on this medication. It is our recommendation that he wait until March of 2016 for elective oral surgery. He verbalizes understanding. I have given him samples of Effient.           Imaging: US Aorta  05/22/2015   CLINICAL DATA:  Abdominal aortic aneurysm, followup ; history coronary artery disease, MI, diabetes mellitus, hypertension, hypercholesterolemia, former smoker  EXAM: ULTRASOUND OF ABDOMINAL AORTA  TECHNIQUE: Ultrasound examination of the abdominal aorta was performed to evaluate for abdominal aortic aneurysm.  COMPARISON:  12/18/2014  FINDINGS: Abdominal Aorta  Aneurysmal dilatation of mid abdominal aorta extending to bifurcation. Associated scattered atherosclerotic changes. Common iliac arteries are aneurysmal at 2.0 cm greatest diameter RIGHT and 1.6 cm diameter LEFT.   Maximum AP  Diameter:  4.0 cm  Maximum TRV  Diameter: 4.1 cm  IMPRESSION: Mid abdominal aortic aneurysm extending to aortic bifurcation, maximal dimensions of 4.0 x 4.1 cm, not significantly changed from the 4.0 x 4.0 cm identified on the previous exam.  Common iliac artery aneurysmal dilatation bilaterally, 2.0 cm RIGHT and 1.6 cm LEFT.   Electronically Signed   By: Lavonia Dana M.D.   On: 05/22/2015 15:23

## 2015-06-09 NOTE — Patient Instructions (Signed)
Your physician recommends that you schedule a follow-up appointment in: 6 months with Arnold Long, NP  Your physician has recommended you make the following change in your medication:   Please start taking Fish Oil   You have been given samples of Effient today.   Thank you for choosing [facility]!

## 2015-07-10 ENCOUNTER — Telehealth: Payer: Self-pay | Admitting: Family Medicine

## 2015-07-10 MED ORDER — CHLORZOXAZONE 500 MG PO TABS: 500.0000 mg | ORAL_TABLET | Freq: Three times a day (TID) | ORAL | Status: DC | PRN

## 2015-07-10 MED ORDER — TRAMADOL HCL 50 MG PO TABS: 50.0000 mg | ORAL_TABLET | Freq: Three times a day (TID) | ORAL | Status: DC | PRN

## 2015-07-10 NOTE — Telephone Encounter (Signed)
Pt pulled something in his back last week and is wanting something called in for it if possible.  [facility]

## 2015-07-10 NOTE — Telephone Encounter (Signed)
He is on a medication Effient which can increase the risk of bleeding. I would not recommend any strong anti-inflammatory's. Instead I would recommend Parafon forte one 3 times a day when necessary muscle tightness spasms to use at home not when driving, #85, 1 refill. Also tramadol 50 mg 1 3 times a day when necessary discomfort, #21, no refill. Follow-up if ongoing troubles warm compresses gentle stretching can help as well

## 2015-07-10 NOTE — Telephone Encounter (Signed)
Notified patient he is on a medication Effient which can increase the risk of bleeding. Would not recommend any strong anti-inflammatory's. Instead would recommend Parafon forte one 3 times a day when necessary muscle tightness spasms to use at home not when driving, #76, 1 refill. Also tramadol 50 mg 1 3 times a day when necessary discomfort, #21, no refill. Follow-up if ongoing troubles warm compresses gentle stretching can help as well. Med sent to pharmacy. Patient was notified and verbalized understanding.

## 2015-07-10 NOTE — Telephone Encounter (Signed)
Taking advil without relief

## 2015-07-14 ENCOUNTER — Ambulatory Visit (HOSPITAL_COMMUNITY)
Admission: RE | Admit: 2015-07-14 | Discharge: 2015-07-14 | Disposition: A | Discharge status: Home / Self Care | Payer: Medicare Other | Source: Ambulatory Visit | Attending: Family Medicine | Admitting: Family Medicine

## 2015-07-14 ENCOUNTER — Ambulatory Visit (INDEPENDENT_AMBULATORY_CARE_PROVIDER_SITE_OTHER): Payer: Medicare Other | Admitting: Family Medicine

## 2015-07-14 VITALS — Ht 72.0 in | Wt 193.0 lb

## 2015-07-14 DIAGNOSIS — M5432 Sciatica, left side: Secondary | ICD-10-CM

## 2015-07-14 MED ORDER — HYDROCODONE-ACETAMINOPHEN 10-325 MG PO TABS: 1.0000 | ORAL_TABLET | ORAL | Status: DC | PRN

## 2015-07-14 MED ORDER — PREDNISONE 20 MG PO TABS: ORAL_TABLET | ORAL | Status: DC

## 2015-07-14 NOTE — Progress Notes (Signed)
   Subjective:    Patient ID: Peter [name], male    DOB: 26-Sep-1941, 74 y.o.   MRN: [id number]  HPI  Patient with c/o back pain for two week. Patient has tried Parafon forte and tramadol but still having pain No fever or chills no vomiting no diarrhea Review of Systems    pain radiates down the left leg worse with certain movements. Denies any difficulty with bowels or urination Objective:   Physical Exam Lungs are clear hearts regular abdomen soft positive straight leg raise on the left negative on the right low back pain subjective discomfort       Assessment & Plan:  Will go ahead and do x-rays. Gentle range of motion recommended, short course prednisone taper, pain medicine every 4 hours when necessary severe pain caution drowsiness, hold off on MRI currently. Follow-up within the next few weeks if ongoing troubles.

## 2015-07-15 ENCOUNTER — Ambulatory Visit: Payer: Medicare Other | Admitting: Family Medicine

## 2015-07-23 ENCOUNTER — Telehealth: Payer: Self-pay | Admitting: Family Medicine

## 2015-07-23 MED ORDER — TRAMADOL HCL 50 MG PO TABS
50.0000 mg | ORAL_TABLET | Freq: Three times a day (TID) | ORAL | Status: DC | PRN
Start: 2015-07-23 — End: 2015-09-02

## 2015-07-23 NOTE — Telephone Encounter (Signed)
Last seen 07/14/15

## 2015-07-23 NOTE — Telephone Encounter (Signed)
plz refill this and 4 additiona;l

## 2015-07-23 NOTE — Telephone Encounter (Signed)
Patient needs refill on tramadol 50 mg called into [facility]

## 2015-07-23 NOTE — Telephone Encounter (Signed)
Notified patient script faxed to pharmacy.

## 2015-08-04 ENCOUNTER — Other Ambulatory Visit: Payer: Self-pay | Admitting: Family Medicine

## 2015-08-28 ENCOUNTER — Other Ambulatory Visit: Payer: Self-pay | Admitting: Family Medicine

## 2015-09-02 ENCOUNTER — Ambulatory Visit (INDEPENDENT_AMBULATORY_CARE_PROVIDER_SITE_OTHER): Payer: Medicare Other | Admitting: Family Medicine

## 2015-09-02 ENCOUNTER — Encounter: Payer: Self-pay | Admitting: Family Medicine

## 2015-09-02 VITALS — BP 126/88 | Ht 72.0 in | Wt 183.4 lb

## 2015-09-02 DIAGNOSIS — E785 Hyperlipidemia, unspecified: Secondary | ICD-10-CM | POA: Diagnosis not present

## 2015-09-02 DIAGNOSIS — Z1211 Encounter for screening for malignant neoplasm of colon: Secondary | ICD-10-CM | POA: Diagnosis not present

## 2015-09-02 DIAGNOSIS — E119 Type 2 diabetes mellitus without complications: Secondary | ICD-10-CM | POA: Diagnosis not present

## 2015-09-02 DIAGNOSIS — I10 Essential (primary) hypertension: Secondary | ICD-10-CM

## 2015-09-02 LAB — POCT GLYCOSYLATED HEMOGLOBIN (HGB A1C): Hemoglobin A1C: 6

## 2015-09-02 NOTE — Progress Notes (Signed)
   Subjective:    Patient ID: Peter [name], male    DOB: 1940/12/11, 74 y.o.   MRN: [id number]  Diabetes He presents for his follow-up diabetic visit. He has type 2 diabetes mellitus. Pertinent negatives for hypoglycemia include no confusion. Pertinent negatives for diabetes include no chest pain, no fatigue, no polydipsia, no polyphagia and no weakness. He has not had a previous visit with a dietitian. He does not see a podiatrist.Eye exam is not current.   Patient states no concerns this visit. Patient with cardiac history also blood pressure issue takes his medicine keeps it under good control watch his diet closely He denies any chest pressure tightness or pain with activity Under a lot of stress his wife is disabled he does all the main caretaking Has some joint pains discomforts uses mainly Tylenol for discomfort occasionally tramadol Hemoglobin a1c today is: 6.0   Review of Systems  Constitutional: Negative for activity change, appetite change and fatigue.  HENT: Negative for congestion.   Respiratory: Negative for cough.   Cardiovascular: Negative for chest pain.  Gastrointestinal: Negative for abdominal pain.  Endocrine: Negative for polydipsia and polyphagia.  Neurological: Negative for weakness.  Psychiatric/Behavioral: Negative for confusion.       Objective:   Physical Exam  Constitutional: He appears well-nourished. No distress.  Cardiovascular: Normal rate, regular rhythm and normal heart sounds.   No murmur heard. Pulmonary/Chest: Effort normal and breath sounds normal. No respiratory distress.  Musculoskeletal: He exhibits no edema.  Lymphadenopathy:    He has no cervical adenopathy.  Neurological: He is alert.  Psychiatric: His behavior is normal.  Vitals reviewed.      Assessment & Plan:  1. Type 2 diabetes mellitus without complication Diabetes good control watch diet stay active - POCT HgB A1C  2. HTN (hypertension), benign Blood pressure good  control continue current measures, recommend 81 mg aspirin  3. Hyperlipidemia Hyperlipidemia check lipid profile. - Lipid panel  4. Screening for colon cancer Pt defers oncolonoscopy - POC Hemoccult Bld/Stl (3-Cd Home Screen); Future

## 2015-09-04 ENCOUNTER — Other Ambulatory Visit: Payer: Self-pay | Admitting: Family Medicine

## 2015-11-09 DIAGNOSIS — E785 Hyperlipidemia, unspecified: Secondary | ICD-10-CM | POA: Diagnosis not present

## 2015-11-10 LAB — LIPID PANEL
Chol/HDL Ratio: 3.6 ratio units (ref 0.0–5.0)
Cholesterol, Total: 186 mg/dL (ref 100–199)
HDL: 52 mg/dL (ref >39)
LDL Calculated: 116 mg/dL — ABNORMAL HIGH (ref 0–99)
Triglycerides: 91 mg/dL (ref 0–149)
VLDL Cholesterol Cal: 18 mg/dL (ref 5–40)

## 2015-11-13 ENCOUNTER — Other Ambulatory Visit: Payer: Self-pay | Admitting: *Deleted

## 2015-11-13 MED ORDER — PRASUGREL HCL 10 MG PO TABS: 10.0000 mg | ORAL_TABLET | Freq: Every day | ORAL | Status: DC

## 2015-12-31 ENCOUNTER — Other Ambulatory Visit: Payer: Self-pay | Admitting: Family Medicine

## 2016-03-02 ENCOUNTER — Encounter: Payer: Self-pay | Admitting: Family Medicine

## 2016-03-02 ENCOUNTER — Other Ambulatory Visit: Payer: Self-pay | Admitting: *Deleted

## 2016-03-02 ENCOUNTER — Ambulatory Visit (INDEPENDENT_AMBULATORY_CARE_PROVIDER_SITE_OTHER): Payer: Medicare Other | Admitting: Family Medicine

## 2016-03-02 VITALS — BP 132/74 | Ht 72.0 in | Wt 188.4 lb

## 2016-03-02 DIAGNOSIS — E785 Hyperlipidemia, unspecified: Secondary | ICD-10-CM

## 2016-03-02 DIAGNOSIS — I714 Abdominal aortic aneurysm, without rupture, unspecified: Secondary | ICD-10-CM

## 2016-03-02 DIAGNOSIS — I10 Essential (primary) hypertension: Secondary | ICD-10-CM | POA: Diagnosis not present

## 2016-03-02 DIAGNOSIS — E119 Type 2 diabetes mellitus without complications: Secondary | ICD-10-CM | POA: Diagnosis not present

## 2016-03-02 DIAGNOSIS — Z7901 Long term (current) use of anticoagulants: Secondary | ICD-10-CM | POA: Diagnosis not present

## 2016-03-02 DIAGNOSIS — Z0000 Encounter for general adult medical examination without abnormal findings: Secondary | ICD-10-CM | POA: Diagnosis not present

## 2016-03-02 LAB — POCT GLYCOSYLATED HEMOGLOBIN (HGB A1C): Hemoglobin A1C: 6.2

## 2016-03-02 MED ORDER — ATENOLOL 100 MG PO TABS: 100.0000 mg | ORAL_TABLET | Freq: Every day | ORAL | Status: DC

## 2016-03-02 MED ORDER — TRAMADOL HCL 50 MG PO TABS: 50.0000 mg | ORAL_TABLET | Freq: Three times a day (TID) | ORAL | Status: DC | PRN

## 2016-03-02 NOTE — Progress Notes (Signed)
   Subjective:    Patient ID: Peter [name], male    DOB: 22-Dec-1940, 75 y.o.   MRN: [id number]  HPI AWV- Annual Wellness Visit  The patient was seen for their annual wellness visit. The patient's past medical history, surgical history, and family history were reviewed. Pertinent vaccines were reviewed ( tetanus, pneumonia, shingles, flu) The patient's medication list was reviewed and updated.  The height and weight were entered. The patient's current BMI is:  Cognitive screening was completed. Outcome of Mini - Cog: Pass  Falls within the past 6 months:None  Current tobacco usage: None (All patients who use tobacco were given written and verbal information on quitting)  Recent listing of emergency department/hospitalizations over the past year were reviewed.  current specialist the patient sees on a regular basis: None   Medicare annual wellness visit patient questionnaire was reviewed.  A written screening schedule for the patient for the next 5-10 years was given. Appropriate discussion of followup regarding next visit was discussed.  Patient would like to discuss pain to groin area.     Review of Systems  Constitutional: Negative for fever, activity change and appetite change.  HENT: Negative for congestion and rhinorrhea.   Eyes: Negative for discharge.  Respiratory: Negative for cough and wheezing.   Cardiovascular: Negative for chest pain.  Gastrointestinal: Negative for vomiting, abdominal pain and blood in stool.  Genitourinary: Negative for frequency and difficulty urinating.  Musculoskeletal: Negative for neck pain.  Skin: Negative for rash.  Allergic/Immunologic: Negative for environmental allergies and food allergies.  Neurological: Negative for weakness and headaches.  Psychiatric/Behavioral: Negative for agitation.       Objective:   Physical Exam  Constitutional: He appears well-developed and well-nourished.  HENT:  Head: Normocephalic and  atraumatic.  Right Ear: External ear normal.  Left Ear: External ear normal.  Nose: Nose normal.  Mouth/Throat: Oropharynx is clear and moist.  Eyes: EOM are normal. Pupils are equal, round, and reactive to light.  Neck: Normal range of motion. Neck supple. No thyromegaly present.  Cardiovascular: Normal rate, regular rhythm and normal heart sounds.   No murmur heard. Pulmonary/Chest: Effort normal and breath sounds normal. No respiratory distress. He has no wheezes.  Abdominal: Soft. Bowel sounds are normal. He exhibits no distension and no mass. There is no tenderness.  Genitourinary: Penis normal.  Musculoskeletal: Normal range of motion. He exhibits no edema.  Lymphadenopathy:    He has no cervical adenopathy.  Neurological: He is alert. He exhibits normal muscle tone.  Skin: Skin is warm and dry. No erythema.  Psychiatric: He has a normal mood and affect. His behavior is normal. Judgment normal.          Assessment & Plan:  Safety dietary discussed Importance of taking medications discussed Follow-up within 3-4 months for standard medical check Groin area no hernia detected Patient has ultrasound coming up

## 2016-03-07 ENCOUNTER — Ambulatory Visit (HOSPITAL_COMMUNITY): Admission: RE | Admit: 2016-03-07 | Payer: Medicare Other | Source: Ambulatory Visit

## 2016-03-07 DIAGNOSIS — E785 Hyperlipidemia, unspecified: Secondary | ICD-10-CM | POA: Diagnosis not present

## 2016-03-07 DIAGNOSIS — Z7901 Long term (current) use of anticoagulants: Secondary | ICD-10-CM | POA: Diagnosis not present

## 2016-03-07 DIAGNOSIS — E119 Type 2 diabetes mellitus without complications: Secondary | ICD-10-CM | POA: Diagnosis not present

## 2016-03-07 DIAGNOSIS — I10 Essential (primary) hypertension: Secondary | ICD-10-CM | POA: Diagnosis not present

## 2016-03-08 ENCOUNTER — Ambulatory Visit: Payer: Medicare Other | Admitting: Adult Health

## 2016-03-08 LAB — BASIC METABOLIC PANEL
BUN/Creatinine Ratio: 12 (ref 10–24)
BUN: 13 mg/dL (ref 8–27)
CO2: 25 mmol/L (ref 18–29)
Calcium: 9.2 mg/dL (ref 8.6–10.2)
Chloride: 99 mmol/L (ref 96–106)
Creatinine, Ser: 1.12 mg/dL (ref 0.76–1.27)
GFR calc Af Amer: 75 mL/min/{1.73_m2} (ref >59)
GFR calc non Af Amer: 65 mL/min/{1.73_m2} (ref >59)
Glucose: 116 mg/dL — ABNORMAL HIGH (ref 65–99)
Potassium: 4.9 mmol/L (ref 3.5–5.2)
Sodium: 139 mmol/L (ref 134–144)

## 2016-03-08 LAB — CBC WITH DIFFERENTIAL/PLATELET
Basophils Absolute: 0 10*3/uL (ref 0.0–0.2)
Basos: 0 %
EOS (ABSOLUTE): 0.1 10*3/uL (ref 0.0–0.4)
Eos: 1 %
Hematocrit: 42.7 % (ref 37.5–51.0)
Hemoglobin: 14.8 g/dL (ref 12.6–17.7)
Immature Grans (Abs): 0 10*3/uL (ref 0.0–0.1)
Immature Granulocytes: 0 %
Lymphocytes Absolute: 2.5 10*3/uL (ref 0.7–3.1)
Lymphs: 36 %
MCH: 33.6 pg — ABNORMAL HIGH (ref 26.6–33.0)
MCHC: 34.7 g/dL (ref 31.5–35.7)
MCV: 97 fL (ref 79–97)
Monocytes Absolute: 0.5 10*3/uL (ref 0.1–0.9)
Monocytes: 7 %
Neutrophils Absolute: 3.8 10*3/uL (ref 1.4–7.0)
Neutrophils: 56 %
Platelets: 127 10*3/uL — ABNORMAL LOW (ref 150–379)
RBC: 4.4 x10E6/uL (ref 4.14–5.80)
RDW: 12.4 % (ref 12.3–15.4)
WBC: 7 10*3/uL (ref 3.4–10.8)

## 2016-03-08 LAB — HEPATIC FUNCTION PANEL
ALT: 28 IU/L (ref 0–44)
AST: 21 IU/L (ref 0–40)
Albumin: 4.4 g/dL (ref 3.5–4.8)
Alkaline Phosphatase: 62 IU/L (ref 39–117)
Bilirubin Total: 1 mg/dL (ref 0.0–1.2)
Bilirubin, Direct: 0.23 mg/dL (ref 0.00–0.40)
Total Protein: 7.1 g/dL (ref 6.0–8.5)

## 2016-03-08 LAB — LIPID PANEL
Chol/HDL Ratio: 6.2 ratio units — ABNORMAL HIGH (ref 0.0–5.0)
Cholesterol, Total: 205 mg/dL — ABNORMAL HIGH (ref 100–199)
HDL: 33 mg/dL — ABNORMAL LOW (ref >39)
LDL Calculated: 149 mg/dL — ABNORMAL HIGH (ref 0–99)
Triglycerides: 114 mg/dL (ref 0–149)
VLDL Cholesterol Cal: 23 mg/dL (ref 5–40)

## 2016-03-09 ENCOUNTER — Ambulatory Visit (HOSPITAL_COMMUNITY)
Admission: RE | Admit: 2016-03-09 | Discharge: 2016-03-09 | Disposition: A | Discharge status: Home / Self Care | Payer: Medicare Other | Source: Ambulatory Visit | Attending: Family Medicine | Admitting: Family Medicine

## 2016-03-09 DIAGNOSIS — I714 Abdominal aortic aneurysm, without rupture: Secondary | ICD-10-CM | POA: Diagnosis not present

## 2016-03-11 ENCOUNTER — Encounter: Payer: Self-pay | Admitting: Adult Health

## 2016-03-11 ENCOUNTER — Encounter: Payer: Medicare Other | Admitting: Adult Health

## 2016-03-11 NOTE — Progress Notes (Signed)
ERROR

## 2016-04-11 ENCOUNTER — Ambulatory Visit (INDEPENDENT_AMBULATORY_CARE_PROVIDER_SITE_OTHER): Payer: Medicare Other | Admitting: Adult Health

## 2016-04-11 ENCOUNTER — Encounter: Payer: Self-pay | Admitting: Adult Health

## 2016-04-11 VITALS — BP 160/80 | HR 66 | Ht 72.0 in | Wt 189.0 lb

## 2016-04-11 DIAGNOSIS — I2510 Atherosclerotic heart disease of native coronary artery without angina pectoris: Secondary | ICD-10-CM

## 2016-04-11 DIAGNOSIS — I10 Essential (primary) hypertension: Secondary | ICD-10-CM

## 2016-04-11 DIAGNOSIS — E7800 Pure hypercholesterolemia, unspecified: Secondary | ICD-10-CM | POA: Diagnosis not present

## 2016-04-11 NOTE — Progress Notes (Deleted)
Name: Peter [name]    DOB: 1941-03-09  Age: 75 y.o.  MR#: [id number]       PCP:  Sallee Lange, MD      Insurance: Payor: [employer] MEDICARE / Plan: [facility] MEDICARE / Product Type: *No Product type* /   CC:   No chief complaint on file.   VS Filed Vitals:   04/11/16 1513  BP: 160/80  Pulse: 66  Height: 6' (1.829 m)  Weight: 189 lb (85.73 kg)  SpO2: 93%    Weights Current Weight  04/11/16 189 lb (85.73 kg)  03/02/16 188 lb 6 oz (85.446 kg)  09/02/15 183 lb 6 oz (83.178 kg)    Blood Pressure  BP Readings from Last 3 Encounters:  04/11/16 160/80  03/02/16 132/74  09/02/15 126/88     Admit date:  (Not on file) Last encounter with RMR:  Visit date not found   Allergy Dye fdc red; Ivp dye; and Plavix  Current Outpatient Prescriptions  Medication Sig Dispense Refill  . ACCU-CHEK COMPACT PLUS test strip USE AS DIRECTED TO TEST BLOOD SUGAR FOUR TIMES DAILY 100 each 5  . atenolol (TENORMIN) 100 MG tablet Take 1 tablet (100 mg total) by mouth daily. 90 tablet 3  . chlorzoxazone (PARAFON) 500 MG tablet Take 1 tablet (500 mg total) by mouth 3 (three) times daily as needed for muscle spasms. 21 tablet 1  . HYDROcodone-acetaminophen (NORCO) 10-325 MG per tablet Take 1 tablet by mouth every 4 (four) hours as needed. 40 tablet 0  . ibuprofen (ADVIL,MOTRIN) 200 MG tablet Take 600 mg by mouth daily as needed (pain). Reported on 03/02/2016    . lisinopril (PRINIVIL,ZESTRIL) 10 MG tablet TAKE ONE TABLET BY MOUTH ONCE DAILY 90 tablet 3  . nitroGLYCERIN (NITROSTAT) 0.4 MG SL tablet Place 1 tablet (0.4 mg total) under the tongue every 5 (five) minutes as needed for chest pain. 30 tablet 0  . traMADol (ULTRAM) 50 MG tablet Take 1 tablet (50 mg total) by mouth 3 (three) times daily as needed. 35 tablet 2   No current facility-administered medications for this visit.    Discontinued Meds:    Medications Discontinued During This Encounter  Medication Reason  . acetaminophen (TYLENOL) 325  MG tablet Error  . aspirin 81 MG tablet Error  . metFORMIN (GLUCOPHAGE) 500 MG tablet Error  . prasugrel (EFFIENT) 10 MG TABS tablet Error    Patient Active Problem List   Diagnosis Date Noted  . 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  PCI- LIMA-LAD insertion site DES 08/14/14 08/15/2014  . COPD (chronic obstructive pulmonary disease) ([place]) 08/15/2014  . 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 Plavix allergy 08/15/2014  . Exertional angina ([place]) 08/14/2014  . HTN (hypertension), benign 02/03/2014  . AAA (abdominal aortic aneurysm) without rupture ([place]) 02/03/2014  . H/O ETOH abuse 04/02/2013  . Bowel habit changes 04/02/2013  . Dyslipidemia 03/13/2013  . Type II diabetes mellitus, well controlled ([place]) 03/13/2013  . Pain, abdominal, nonspecific 03/13/2013    LABS    Component Value Date/Time   NA 139 03/07/2016 0924   NA 138 05/28/2015 1120   NA 139 08/15/2014 0400   NA 138 08/06/2014 1146   NA 136* 07/31/2014 1210   K 4.9 03/07/2016 0924   K 4.7 05/28/2015 1120   K 3.8 08/15/2014 0400   CL 99 03/07/2016 0924   CL 98 05/28/2015 1120   CL 104 08/15/2014 0400   CO2 25 03/07/2016 0924  CO2 23 05/28/2015 1120   CO2 23 08/15/2014 0400   GLUCOSE 116* 03/07/2016 0924   GLUCOSE 177* 05/28/2015 1120   GLUCOSE 185* 08/15/2014 0400   GLUCOSE 134* 08/06/2014 1146   GLUCOSE 176* 07/31/2014 1210   BUN 13 03/07/2016 0924   BUN 12 05/28/2015 1120   BUN 18 08/15/2014 0400   BUN 12 08/06/2014 1146   BUN 16 07/31/2014 1210   CREATININE 1.12 03/07/2016 0924   CREATININE 1.12 05/28/2015 1120   CREATININE 1.15 08/15/2014 0400   CREATININE 1.05 08/06/2014 1146   CREATININE 1.10 02/05/2014 1000   CREATININE 1.14 03/13/2013 1158   CALCIUM 9.2 03/07/2016 0924   CALCIUM 9.3 05/28/2015 1120   CALCIUM 8.3* 08/15/2014 0400   GFRNONAA 65 03/07/2016 0924   GFRNONAA 65 05/28/2015 1120   GFRNONAA 62* 08/15/2014 0400   GFRAA 75 03/07/2016 0924   GFRAA 75 05/28/2015 1120   GFRAA 72*  08/15/2014 0400   CMP     Component Value Date/Time   NA 139 03/07/2016 0924   NA 139 08/15/2014 0400   K 4.9 03/07/2016 0924   CL 99 03/07/2016 0924   CO2 25 03/07/2016 0924   GLUCOSE 116* 03/07/2016 0924   GLUCOSE 185* 08/15/2014 0400   BUN 13 03/07/2016 0924   BUN 18 08/15/2014 0400   CREATININE 1.12 03/07/2016 0924   CREATININE 1.05 08/06/2014 1146   CALCIUM 9.2 03/07/2016 0924   PROT 7.1 03/07/2016 0924   PROT 7.1 08/06/2014 1146   ALBUMIN 4.4 03/07/2016 0924   ALBUMIN 4.1 08/06/2014 1146   AST 21 03/07/2016 0924   ALT 28 03/07/2016 0924   ALKPHOS 62 03/07/2016 0924   BILITOT 1.0 03/07/2016 0924   BILITOT 0.9 08/06/2014 1146   GFRNONAA 65 03/07/2016 0924   GFRAA 75 03/07/2016 0924       Component Value Date/Time   WBC 7.0 03/07/2016 0924   WBC 8.0 05/28/2015 1120   WBC 10.7* 08/15/2014 0400   WBC 12.4* 08/14/2014 1108   WBC 6.2 07/31/2014 1210   HGB 13.0 08/15/2014 0400   HGB 14.5 08/14/2014 1108   HGB 15.0 07/31/2014 1210   HCT 42.7 03/07/2016 0924   HCT 43.5 05/28/2015 1120   HCT 36.7* 08/15/2014 0400   HCT 40.4 08/14/2014 1108   HCT 41.7 07/31/2014 1210   MCV 97 03/07/2016 0924   MCV 94 05/28/2015 1120   MCV 96.3 08/15/2014 0400   MCV 92.9 08/14/2014 1108   MCV 94.8 07/31/2014 1210    Lipid Panel     Component Value Date/Time   CHOL 205* 03/07/2016 0924   CHOL 183 08/06/2014 1146   TRIG 114 03/07/2016 0924   HDL 33* 03/07/2016 0924   HDL 36* 08/06/2014 1146   CHOLHDL 6.2* 03/07/2016 0924   CHOLHDL 5.1 08/06/2014 1146   VLDL 23 08/06/2014 1146   LDLCALC 149* 03/07/2016 0924   LDLCALC 124* 08/06/2014 1146    ABG No results found for: PHART, PCO2ART, PO2ART, HCO3, TCO2, ACIDBASEDEF, O2SAT   No results found for: TSH BNP (last 3 results) No results for input(s): BNP in the last 8760 hours.  ProBNP (last 3 results) No results for input(s): PROBNP in the last 8760 hours.  Cardiac Panel (last 3 results) No results for input(s): CKTOTAL,  CKMB, TROPONINI, RELINDX in the last 72 hours.  Iron/TIBC/Ferritin/ %Sat No results found for: IRON, TIBC, FERRITIN, IRONPCTSAT   EKG Orders placed or performed in visit on 04/11/16  . EKG 12-Lead     Prior  Assessment and Plan Problem List as of 04/11/2016      Cardiovascular and Mediastinum   HTN (hypertension), benign   Last Assessment & Plan 12/11/2014 Office Visit Written 12/11/2014  1:36 PM by Lendon Colonel, NP     Blood pressure is well controlled. No changes in medication regimen at this time. Will see him again in 6 months unless symptomatic.,      AAA (abdominal aortic aneurysm) without rupture [facility])   CAD-CABG '97- multiple PCIs since   Last Assessment & Plan 09/01/2014 Office Visit Written 09/01/2014  2:38 PM by Lendon Colonel, NP    He is s/p DES to the LIMA to LAD insertion. He is feeling better and is compliant with his medications, He is concerned with the cost of the antiplatelet medications. I will give him samples of Effient, he is given paperwork to fill out for reduced cost Rx. He is advised to begin cardiac rehab. He otherwise will stay on current regimen. Will see him again in 3 months.       Exertional angina (HCC)     Respiratory   COPD (chronic obstructive pulmonary disease) ([place])     Endocrine   Type II diabetes mellitus, well controlled ([place])     Other   Dyslipidemia   Last Assessment & Plan 09/01/2014 Office Visit Written 09/01/2014  2:41 PM by Lendon Colonel, NP    In order save him money, I have changed his atorvastatin to lovastatin 20 mg daily. Follow up labs in 3 months.       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 Reports new onset constipation when he stopped consuming etoh 01/2013. Previously had loose stool. Denies brbpr,  melena. H/O intermittent etoh abuse over past five years but with periods of no etoh use for 6 months at a time. Previously with some vague abdominal discomfort but states this has resolved. GI symptoms possibly related to etoh gastritis/colopathy but cannot exclude pancreatitic etiology. Denies GERD symptoms. Did not take the PPI recommended by Dr. Wolfgang Phoenix a few weeks ago. Tried some "home remedies." Discussed the recommendations of pursing colonoscopy for bowel changes which could be secondary to d/c etoh use but need to exclude malignancy. Discussed risks at length with patient including pros/cons of procedure. He would like to think about it before agreeing to scheduling a colonoscopy. He has also requesting to know expense of procedure and our office staff will help him with that.   He will call us and let us know what he decides.   If he has recurrent abdominal pain, would consider CT A/P at that time. He will continue to have u/s surveillance of his AAA with Dr. Wolfgang Phoenix as planned.       PCI- LIMA-LAD insertion site DES 08/14/14   Last Assessment & Plan 12/11/2014 Office Visit Written 12/11/2014  1:36 PM by Lendon Colonel, NP    I have explained to him that he should remain on Effient for a minimum of one year, if surgery is indicated, usually wait for 6 months before stopping temporarily, He is anxious to have teeth pulled due to broken teeth. I have asked him to discuss this with oral surgeon concerning his preference about stopping Effient or  doing extractions on this medication. It is our recommendation that he wait until March of 2016 for elective oral surgery. He verbalizes understanding. I have given him samples of Effient.           Imaging: No results found.

## 2016-04-11 NOTE — Patient Instructions (Signed)
Medication Instructions:  START ASPIRIN 81 MG DAILY   Labwork: NONE  Testing/Procedures: NONE  Follow-Up: Your physician wants you to follow-up in: 6 MONTHS .  You will receive a reminder letter in the mail two months in advance. If you don't receive a letter, please call our office to schedule the follow-up appointment.   Any Other Special Instructions Will Be Listed Below (If Applicable).     If you need a refill on your cardiac medications before your next appointment, please call your pharmacy.

## 2016-04-11 NOTE — Progress Notes (Signed)
Cardiology Office Note   Date:  04/11/2016   ID:  CARDER BELOW, DOB May 25, 1941, MRN [id number]  PCP:  Sallee Lange, MD  Cardiologist: Cloria Spring, NP   Chief Complaint  Patient presents with  . Coronary Artery Disease      History of Present Illness: Peter [name] is a 75 y.o. male who presents for for ongoing assessment and management of CAD, with hx of CABG, PCI in 2003 and 2008, with most recent cardiac cath demonstrating 90% stenosis just beyond the graft insertion site of the LIMA to LAD, with DES placed in 2015.he was last seen in July of 2016 and was doing well. Consideration for stopping Effient on this office appointment will be discussed.  He is here today without any cardiac complaints. He has stopped taking F. In for approximately 2 months which she has chosen to do so on his own. He said he was getting too many bruises and bleeding spots on his arm and decided to stop it. He denies any recurrent chest pain dyspnea on exertion or further bruising or bleeding issues. The patient is adamant about limiting the amount of medication he needs to take daily. He remains active,  He takes care of an invalid wife.  Past Medical History  Diagnosis Date  . Myocardial infarction [facility]) 1997    stents x2 (2003/2007)  . AAA (abdominal aortic aneurysm) ([place])   . Hypertension   . CAD (coronary artery disease)     CABG '97, multiple PCIs  . Dyslipidemia   . Diabetes mellitus     diet controlled  . COPD (chronic obstructive pulmonary disease) [facility])     Past Surgical History  Procedure Laterality Date  . Cystectomy  2005    top of head-[place]  . Coronary angioplasty  2003/2007    2 stents  . 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 Ear cyst excision  07/09/2012    Procedure: CYST REMOVAL;  Surgeon: Jamesetta So, MD;  Location: [facility];  Service: General;  Laterality: N/A;  . Coronary angioplasty with stent placement  08/14/14    DES to LIMA-LAD insertion  .  Coronary stent placement  08/15/2014    MID LAD  DES  by Dr Burt Knack  . Left heart catheterization with coronary/graft angiogram N/A 08/14/2014    Procedure: LEFT HEART CATHETERIZATION WITH Beatrix Fetters;  Surgeon: Blane Ohara, MD;  Location: [facility] CATH LAB;  Service: Cardiovascular;  Laterality: N/A;     Current Outpatient Prescriptions  Medication Sig Dispense Refill  . ACCU-CHEK COMPACT PLUS test strip USE AS DIRECTED TO TEST BLOOD SUGAR FOUR TIMES DAILY 100 each 5  . aspirin 81 MG tablet Take 81 mg by mouth daily.    Marland Kitchen atenolol (TENORMIN) 100 MG tablet Take 1 tablet (100 mg total) by mouth daily. 90 tablet 3  . chlorzoxazone (PARAFON) 500 MG tablet Take 1 tablet (500 mg total) by mouth 3 (three) times daily as needed for muscle spasms. 21 tablet 1  . HYDROcodone-acetaminophen (NORCO) 10-325 MG per tablet Take 1 tablet by mouth every 4 (four) hours as needed. 40 tablet 0  . ibuprofen (ADVIL,MOTRIN) 200 MG tablet Take 600 mg by mouth daily as needed (pain). Reported on 03/02/2016    . lisinopril (PRINIVIL,ZESTRIL) 10 MG tablet TAKE ONE TABLET BY MOUTH ONCE DAILY 90 tablet 3  . nitroGLYCERIN (NITROSTAT) 0.4 MG SL tablet Place 1 tablet (0.4 mg total) under the tongue every  5 (five) minutes as needed for chest pain. 30 tablet 0  . traMADol (ULTRAM) 50 MG tablet Take 1 tablet (50 mg total) by mouth 3 (three) times daily as needed. 35 tablet 2   No current facility-administered medications for this visit.    Allergies:   Dye fdc red; Ivp dye; and Plavix    Social History:  The patient  reports that he quit smoking about 15 years ago. His smoking use included Cigarettes. He has a 120 pack-year smoking history. He has never used smokeless tobacco. He reports that he does not drink alcohol or use illicit drugs.   Family History:  The patient's family history includes Heart attack in his mother; Hypertension in his father; Other in his father. There is no history of Colon cancer or Liver  disease.    ROS: All other systems are reviewed and negative. Unless otherwise mentioned in H&P    PHYSICAL EXAM: VS:  BP 160/80 mmHg  Pulse 66  Ht 6' (1.829 m)  Wt 189 lb (85.73 kg)  BMI 25.63 kg/m2  SpO2 93% , BMI Body mass index is 25.63 kg/(m^2). GEN: Well nourished, well developed, in no acute distress HEENT: normal Neck: no JVD, carotid bruits, or masses Cardiac:RRR; 1/6 systolic murmur, no murmurs, rubs, or gallops,no edema  Respiratory:  clear to auscultation bilaterally, normal work of breathing GI: soft, nontender, nondistended, + BS MS: no deformity or atrophy Skin: warm and dry, no rash Neuro:  Strength and sensation are intact Psych: euthymic mood, full affect   EKG:  The ekg ordered today demonstrates normal sinus rhythmwith biphasic T waves noted in V4 and V5 with nonspecific flattening noted in leads II-III. Rate of 67 beats per minute.   Recent Labs: 03/07/2016: ALT 28; BUN 13; Creatinine, Ser 1.12; Platelets 127*; Potassium 4.9; Sodium 139    Lipid Panel    Component Value Date/Time   CHOL 205* 03/07/2016 0924   CHOL 183 08/06/2014 1146   TRIG 114 03/07/2016 0924   HDL 33* 03/07/2016 0924   HDL 36* 08/06/2014 1146   CHOLHDL 6.2* 03/07/2016 0924   CHOLHDL 5.1 08/06/2014 1146   VLDL 23 08/06/2014 1146   LDLCALC 149* 03/07/2016 0924   LDLCALC 124* 08/06/2014 1146      Wt Readings from Last 3 Encounters:  04/11/16 189 lb (85.73 kg)  03/02/16 188 lb 6 oz (85.446 kg)  09/02/15 183 lb 6 oz (83.178 kg)     ASSESSMENT AND PLAN:  1. Coronary artery disease: history PCI to the LIMA to LAD, at the insertion site in September of 2015. Patient also has a stent to the apical portion of the LAD. The stent is small in caliber and appeared patent with only mild diffuse in-stent restenosis per cath in 2015.he has stopped taking Effient on his own for the last 2 months due to bleeding and bruising issues in his arms. I will start him on aspirin 81 mg daily. He will  continue taking atenolol 100 mg daily, is noted that he is not on statin therapy. Labs are followed by his primary care physician. Most recent labs drawn in April 2017.  2. Hypercholesterolemia:most recent labs find his cholesterol the 205, triglycerides 114, HDL 33, LDL 149. It is recommended that the patient be on a statin therapy. LDL should be 70 in patients with known history of CAD.Marland Kitchen He had been on atorvastatin in the past. He no longer wishes to be on statin therapy. He is advised on a low cholesterol  diet.he wishes to limit the amount of medications he is taking.  3. Hypertension:patient blood pressure was elevated in the office initially, but this was rechecked in the exam room and had come down to 146/82. He will continue current medication regimen as described.   Current medicines are reviewed at length with the patient today.    Labs/ tests ordered today include:   Orders Placed This Encounter  Procedures  . EKG 12-Lead     Disposition:   FU with 6 months.    Signed, Jory Sims, NP  04/11/2016 4:29 PM    [facility] [address], [place], [place] [address] Phone: [phone]; Fax: [phone]

## 2016-04-12 ENCOUNTER — Ambulatory Visit: Payer: Medicare Other | Admitting: Adult Health

## 2016-04-19 ENCOUNTER — Other Ambulatory Visit: Payer: Self-pay | Admitting: *Deleted

## 2016-04-19 MED ORDER — DOXYCYCLINE HYCLATE 100 MG PO TABS: 100.0000 mg | ORAL_TABLET | Freq: Two times a day (BID) | ORAL | Status: DC

## 2016-04-29 ENCOUNTER — Telehealth: Payer: Self-pay | Admitting: Family Medicine

## 2016-04-29 MED ORDER — TRIAMCINOLONE ACETONIDE 0.1 % EX CREA: 1.0000 "application " | TOPICAL_CREAM | Freq: Two times a day (BID) | CUTANEOUS | Status: AC

## 2016-04-29 NOTE — Telephone Encounter (Signed)
Rx sent electronically to pharmacy. Patient notified. 

## 2016-04-29 NOTE — Telephone Encounter (Signed)
triamcinolone cream (KENALOG) 0.1 %  [facility]   Having issues with dry ears and itching

## 2016-04-29 NOTE — Telephone Encounter (Signed)
6 refills please 

## 2016-05-11 ENCOUNTER — Other Ambulatory Visit: Payer: Self-pay | Admitting: Family Medicine

## 2016-07-29 ENCOUNTER — Encounter: Payer: Self-pay | Admitting: Family Medicine

## 2016-07-29 ENCOUNTER — Ambulatory Visit (INDEPENDENT_AMBULATORY_CARE_PROVIDER_SITE_OTHER): Payer: Medicare Other | Admitting: Family Medicine

## 2016-07-29 ENCOUNTER — Telehealth: Payer: Self-pay | Admitting: Family Medicine

## 2016-07-29 ENCOUNTER — Ambulatory Visit (HOSPITAL_COMMUNITY)
Admission: RE | Admit: 2016-07-29 | Discharge: 2016-07-29 | Disposition: A | Discharge status: Home / Self Care | Payer: Medicare Other | Source: Ambulatory Visit | Attending: Family Medicine | Admitting: Family Medicine

## 2016-07-29 VITALS — BP 130/80 | Ht 72.0 in | Wt 182.8 lb

## 2016-07-29 DIAGNOSIS — E119 Type 2 diabetes mellitus without complications: Secondary | ICD-10-CM

## 2016-07-29 DIAGNOSIS — I10 Essential (primary) hypertension: Secondary | ICD-10-CM

## 2016-07-29 DIAGNOSIS — L03115 Cellulitis of right lower limb: Secondary | ICD-10-CM | POA: Diagnosis not present

## 2016-07-29 DIAGNOSIS — M7989 Other specified soft tissue disorders: Secondary | ICD-10-CM | POA: Insufficient documentation

## 2016-07-29 DIAGNOSIS — M79671 Pain in right foot: Secondary | ICD-10-CM | POA: Diagnosis not present

## 2016-07-29 DIAGNOSIS — M5432 Sciatica, left side: Secondary | ICD-10-CM

## 2016-07-29 DIAGNOSIS — E785 Hyperlipidemia, unspecified: Secondary | ICD-10-CM

## 2016-07-29 MED ORDER — CEPHALEXIN 500 MG PO CAPS: 500.0000 mg | ORAL_CAPSULE | Freq: Four times a day (QID) | ORAL | 0 refills | Status: DC

## 2016-07-29 MED ORDER — MUPIROCIN 2 % EX OINT: TOPICAL_OINTMENT | CUTANEOUS | 0 refills | Status: AC

## 2016-07-29 MED ORDER — HYDROCODONE-ACETAMINOPHEN 10-325 MG PO TABS: 1.0000 | ORAL_TABLET | ORAL | 0 refills | Status: DC | PRN

## 2016-07-29 MED ORDER — MUPIROCIN 2 % EX OINT: TOPICAL_OINTMENT | CUTANEOUS | 0 refills | Status: DC

## 2016-07-29 NOTE — Telephone Encounter (Signed)
Discussed patient call with Dr.Scott Luking in real time and was given verbal orders for Xray of the right foot. Order entered in epic. Patient notified and verbalized understanding.

## 2016-07-29 NOTE — Addendum Note (Signed)
Addended by: Ofilia Neas R on: 07/29/2016 11:27 AM   Modules accepted: Orders

## 2016-07-29 NOTE — Progress Notes (Signed)
   Subjective:    Patient ID: Peter [name], male    DOB: 27-Aug-1941, 75 y.o.   MRN: [id number]  HPI Patient arrives with c/o right foot pain Patient body hip foot turned red painful discomfort 1 and an x-ray we did an x-ray was negative but he has ongoing redness swelling and discomfort so he comes here today to have this evaluated. He also needs a refill on his pain medicine uses them sparingly.  Review of Systems Denies high fever chills sweats relates pain discomfort swelling in the foot    Objective:   Physical Exam  He has cellulitis of the right foot primarily around the third and fourth digit no sign of any fracture. Has swelling tenderness redness and maceration between the toes      Assessment & Plan:  Recommend Bactroban ointment between the toes daily, antibiotic prescribed, x-ray negative, ibuprofen discomfort  Pain medication renewed for chronic back pain and knee pain  Labs ordered patient to follow-up for recheck of the foot and follow-up on labs

## 2016-07-29 NOTE — Telephone Encounter (Signed)
PT states that he would like to have an XR done before coming in. Advised the pt that he may need to be seen first. Says he hit his foot on something a couple days ago and cannot bend his toes.

## 2016-08-04 ENCOUNTER — Other Ambulatory Visit: Payer: Self-pay | Admitting: *Deleted

## 2016-08-04 ENCOUNTER — Telehealth: Payer: Self-pay | Admitting: Family Medicine

## 2016-08-04 DIAGNOSIS — I10 Essential (primary) hypertension: Secondary | ICD-10-CM | POA: Diagnosis not present

## 2016-08-04 DIAGNOSIS — E119 Type 2 diabetes mellitus without complications: Secondary | ICD-10-CM | POA: Diagnosis not present

## 2016-08-04 DIAGNOSIS — E785 Hyperlipidemia, unspecified: Secondary | ICD-10-CM | POA: Diagnosis not present

## 2016-08-04 MED ORDER — DOXYCYCLINE HYCLATE 100 MG PO TABS: 100.0000 mg | ORAL_TABLET | Freq: Two times a day (BID) | ORAL | 0 refills | Status: DC

## 2016-08-04 NOTE — Telephone Encounter (Signed)
Patient given Keflex on 07/29/16 for cellulitis of foot

## 2016-08-04 NOTE — Telephone Encounter (Signed)
If develops trouble breathing go to ER  Stop keflex,  Start doxy 100 bid ten d

## 2016-08-04 NOTE — Telephone Encounter (Signed)
Prescription sent electronically to pharmacy. Patient notified and advised to stop keflex and go to ER if develops trouble breathing. Patient verbalized understanding.

## 2016-08-04 NOTE — Telephone Encounter (Signed)
Patient is having unusual hoarseness.  This is a side effect of the medication cephALEXin (KEFLEX) 500 MG capsule and the pamphlet said to call his doctor ASAP if this occurs.  Please advise.

## 2016-08-05 LAB — HEPATIC FUNCTION PANEL
ALT: 29 IU/L (ref 0–44)
AST: 30 IU/L (ref 0–40)
Albumin: 4.1 g/dL (ref 3.5–4.8)
Alkaline Phosphatase: 67 IU/L (ref 39–117)
Bilirubin Total: 0.5 mg/dL (ref 0.0–1.2)
Bilirubin, Direct: 0.16 mg/dL (ref 0.00–0.40)
Total Protein: 6.7 g/dL (ref 6.0–8.5)

## 2016-08-05 LAB — LIPID PANEL
Chol/HDL Ratio: 3.2 ratio units (ref 0.0–5.0)
Cholesterol, Total: 159 mg/dL (ref 100–199)
HDL: 49 mg/dL (ref >39)
LDL Calculated: 94 mg/dL (ref 0–99)
Triglycerides: 78 mg/dL (ref 0–149)
VLDL Cholesterol Cal: 16 mg/dL (ref 5–40)

## 2016-08-05 LAB — BASIC METABOLIC PANEL
BUN/Creatinine Ratio: 12 (ref 10–24)
BUN: 13 mg/dL (ref 8–27)
CO2: 26 mmol/L (ref 18–29)
Calcium: 8.9 mg/dL (ref 8.6–10.2)
Chloride: 97 mmol/L (ref 96–106)
Creatinine, Ser: 1.11 mg/dL (ref 0.76–1.27)
GFR calc Af Amer: 75 mL/min/{1.73_m2} (ref >59)
GFR calc non Af Amer: 65 mL/min/{1.73_m2} (ref >59)
Glucose: 116 mg/dL — ABNORMAL HIGH (ref 65–99)
Potassium: 4.4 mmol/L (ref 3.5–5.2)
Sodium: 137 mmol/L (ref 134–144)

## 2016-08-05 LAB — HEMOGLOBIN A1C
Est. average glucose Bld gHb Est-mCnc: 128 mg/dL
Hgb A1c MFr Bld: 6.1 % — ABNORMAL HIGH (ref 4.8–5.6)

## 2016-08-10 ENCOUNTER — Ambulatory Visit (INDEPENDENT_AMBULATORY_CARE_PROVIDER_SITE_OTHER): Payer: Medicare Other | Admitting: Family Medicine

## 2016-08-10 ENCOUNTER — Encounter: Payer: Self-pay | Admitting: Family Medicine

## 2016-08-10 VITALS — BP 132/74 | Temp 98.4°F | Ht 72.0 in | Wt 185.0 lb

## 2016-08-10 DIAGNOSIS — E785 Hyperlipidemia, unspecified: Secondary | ICD-10-CM

## 2016-08-10 DIAGNOSIS — E119 Type 2 diabetes mellitus without complications: Secondary | ICD-10-CM | POA: Diagnosis not present

## 2016-08-10 DIAGNOSIS — L03115 Cellulitis of right lower limb: Secondary | ICD-10-CM | POA: Diagnosis not present

## 2016-08-10 DIAGNOSIS — I10 Essential (primary) hypertension: Secondary | ICD-10-CM

## 2016-08-10 DIAGNOSIS — I714 Abdominal aortic aneurysm, without rupture, unspecified: Secondary | ICD-10-CM

## 2016-08-10 NOTE — Patient Instructions (Signed)
Diabetes and Foot Care Diabetes may cause you to have problems because of poor blood supply (circulation) to your feet and legs. This may cause the skin on your feet to become thinner, break easier, and heal more slowly. Your skin may become dry, and the skin may peel and crack. You may also have nerve damage in your legs and feet causing decreased feeling in them. You may not notice minor injuries to your feet that could lead to infections or more serious problems. Taking care of your feet is one of the most important things you can do for yourself.  HOME CARE INSTRUCTIONS  Wear shoes at all times, even in the house. Do not go barefoot. Bare feet are easily injured.  Check your feet daily for blisters, cuts, and redness. If you cannot see the bottom of your feet, use a mirror or ask someone for help.  Wash your feet with warm water (do not use hot water) and mild soap. Then pat your feet and the areas between your toes until they are completely dry. Do not soak your feet as this can dry your skin.  Apply a moisturizing lotion or petroleum jelly (that does not contain alcohol and is unscented) to the skin on your feet and to dry, brittle toenails. Do not apply lotion between your toes.  Trim your toenails straight across. Do not dig under them or around the cuticle. File the edges of your nails with an emery board or nail file.  Do not cut corns or calluses or try to remove them with medicine.  Wear clean socks or stockings every day. Make sure they are not too tight. Do not wear knee-high stockings since they may decrease blood flow to your legs.  Wear shoes that fit properly and have enough cushioning. To break in new shoes, wear them for just a few hours a day. This prevents you from injuring your feet. Always look in your shoes before you put them on to be sure there are no objects inside.  Do not cross your legs. This may decrease the blood flow to your feet.  If you find a minor scrape,  cut, or break in the skin on your feet, keep it and the skin around it clean and dry. These areas may be cleansed with mild soap and water. Do not cleanse the area with peroxide, alcohol, or iodine.  When you remove an adhesive bandage, be sure not to damage the skin around it.  If you have a wound, look at it several times a day to make sure it is healing.  Do not use heating pads or hot water bottles. They may burn your skin. If you have lost feeling in your feet or legs, you may not know it is happening until it is too late.  Make sure your health care provider performs a complete foot exam at least annually or more often if you have foot problems. Report any cuts, sores, or bruises to your health care provider immediately. SEEK MEDICAL CARE IF:   You have an injury that is not healing.  You have cuts or breaks in the skin.  You have an ingrown nail.  You notice redness on your legs or feet.  You feel burning or tingling in your legs or feet.  You have pain or cramps in your legs and feet.  Your legs or feet are numb.  Your feet always feel cold. SEEK IMMEDIATE MEDICAL CARE IF:   There is increasing redness,   swelling, or pain in or around a wound.  There is a red line that goes up your leg.  Pus is coming from a wound.  You develop a fever or as directed by your health care provider.  You notice a bad smell coming from an ulcer or wound.   This information is not intended to replace advice given to you by your health care provider. Make sure you discuss any questions you have with your health care provider.   Document Released: 11/18/2000 Document Revised: 07/24/2013 Document Reviewed: 04/30/2013 Elsevier Interactive Patient Education 2016 [employer]  

## 2016-08-10 NOTE — Progress Notes (Signed)
   Subjective:    Patient ID: Peter [name], male    DOB: 09-23-1941, 75 y.o.   MRN: [id number]  HPI Patient arrives for a recheck from cellulitis of right leg.Patient relates he's been taking the antibiotics the foot area is looking much better the area between the fourth and fifth toe is looking better as well he wears very tight fitting shoes I discussed with him at length about the importance of trying to find shoes that are less constricting  Patient having a lot of fatigue and tiredness related to taking care of his disabled wife but he states he's trying to do the best he can Patient denies any cardiac complications no chest tightness pressure pain he does take his blood pressure medicine on a regular basis  Patient also has intermittent arthralgias for which he uses tramadol for occasionally  Patient does try to watch fats in his diet but does not one of be on any statins  Patient understands importance of pneumonia and flu vaccine but does not one any vaccines discussion held regarding safety of vaccines encouragement to get these.        Review of Systems  Constitutional: Negative for activity change, appetite change and fatigue.  HENT: Negative for congestion.   Respiratory: Negative for cough.   Cardiovascular: Negative for chest pain.  Gastrointestinal: Negative for abdominal pain.  Endocrine: Negative for polydipsia and polyphagia.  Neurological: Negative for weakness.  Psychiatric/Behavioral: Negative for confusion.   He denies any chest tightness pressure pain denies any shortness of breath relates his energy level doing fairly well considering all that he goes through     Objective:   Physical Exam  Constitutional: He appears well-nourished. No distress.  Cardiovascular: Normal rate, regular rhythm and normal heart sounds.   No murmur heard. Pulmonary/Chest: Effort normal and breath sounds normal. No respiratory distress.  Musculoskeletal: He exhibits no  edema.  Lymphadenopathy:    He has no cervical adenopathy.  Neurological: He is alert.  Psychiatric: His behavior is normal.  Vitals reviewed.    25 minutes was spent with the patient. Greater than half the time was spent in discussion and answering questions and counseling regarding the issues that the patient came in for today. Immunizations recommended patient defers     Assessment & Plan:Hypertension good control continue current medicines  Aortic aneurysm repeat scan in the spring. Aneurysm not at a size that's worrisome currently  Dyslipidemia- importance of watching fats in the diet exercising. Patient does not want to be on any medication  Recent lab work including cholesterol metabolic 7 and [id number] was reviewed with the patient  Diabetes under good control continue current measures watch diet closely  Foot infection doing well warning signs discussed regarding advancement of infection recheck foot approximate 3 weeks

## 2016-08-16 ENCOUNTER — Other Ambulatory Visit: Payer: Self-pay

## 2016-08-16 ENCOUNTER — Telehealth: Payer: Self-pay | Admitting: Family Medicine

## 2016-08-16 MED ORDER — CEFPROZIL 500 MG PO TABS: 500.0000 mg | ORAL_TABLET | Freq: Two times a day (BID) | ORAL | 0 refills | Status: DC

## 2016-08-16 NOTE — Telephone Encounter (Signed)
Notified patient discontinue doxycycline, Cefzil 500 mg twice a day for 7 days-note-patient's drug profile states allergy to Keflex but it's really more of a side effects of therefore he should be able to take Cefzil. Patient verbalized understanding, med sent to pharmacy.

## 2016-08-16 NOTE — Telephone Encounter (Signed)
Discontinue doxycycline, Cefzil 500 mg twice a day for 7 days-note-patient's drug profile states allergy to Keflex but it's really more of a side effects of therefore he should be able to take Cefzil

## 2016-08-16 NOTE — Telephone Encounter (Signed)
Patient was prescribed doxycycline 100 mg BID x 10 days

## 2016-08-16 NOTE — Telephone Encounter (Signed)
Patient was given an antibiotic for his foot last week for cellulitis.  He said it is not completely healed up and thinks he may need another antibiotic.   [facility]

## 2016-08-17 ENCOUNTER — Telehealth: Payer: Self-pay | Admitting: Family Medicine

## 2016-08-17 MED ORDER — DOXYCYCLINE HYCLATE 100 MG PO TABS: 100.0000 mg | ORAL_TABLET | Freq: Two times a day (BID) | ORAL | 0 refills | Status: DC

## 2016-08-17 NOTE — Telephone Encounter (Signed)
Patient had Rx for cefPROZIL (CEFZIL) 500 MG tablet called in yesterday, but he refused to pick it up because of the price and he was talking to the pharmacist who believed he may have a reaction to the medication.  He wants to know if we can call in the doxycyline that he was prescribed previously.  [facility]

## 2016-08-17 NOTE — Telephone Encounter (Signed)
Doxy 100 bi ten d

## 2016-08-17 NOTE — Telephone Encounter (Signed)
Notified patient medication sent to pharmacy 

## 2016-09-01 ENCOUNTER — Encounter: Payer: Self-pay | Admitting: Family Medicine

## 2016-09-01 ENCOUNTER — Ambulatory Visit (INDEPENDENT_AMBULATORY_CARE_PROVIDER_SITE_OTHER): Payer: Medicare Other | Admitting: Family Medicine

## 2016-09-01 VITALS — BP 144/86 | Ht 72.0 in | Wt 181.4 lb

## 2016-09-01 DIAGNOSIS — L03115 Cellulitis of right lower limb: Secondary | ICD-10-CM

## 2016-09-01 DIAGNOSIS — M25571 Pain in right ankle and joints of right foot: Secondary | ICD-10-CM

## 2016-09-01 NOTE — Progress Notes (Signed)
   Subjective:    Patient ID: Peter [name], male    DOB: 27-Sep-1941, 75 y.o.   MRN: [id number]  HPI Patient in today for a recheck to cellulitis of the right foot. Patient also has concerns of bump to back. This area on his foot seems to be healing better. Mother still some soreness with it. There is no drainage with it. No known injury.  He also has a lump on the back that he states got out some dark material from it. Denies any pain or discomfort with it. Review of Systems No chest tightness pressure pain shortness breath    Objective:   Physical Exam Lungs clear hearts regular has a sebaceous cyst on his back not infected he does have small sore on his foot in between the fourth and fifth digit on the right foot. This seems to be healing there is a callus in between I believe it is how his feet are pressing against each other. He would benefit from seen podiatry       Assessment & Plan:  Infection is healing Referral to podiatry Sebaceous cyst on back patient does not want removal currently if it starts bothering him referral to surgery

## 2016-09-02 ENCOUNTER — Encounter: Payer: Self-pay | Admitting: Family Medicine

## 2016-09-07 ENCOUNTER — Encounter: Payer: Self-pay | Admitting: Family Medicine

## 2016-09-13 DIAGNOSIS — L89892 Pressure ulcer of other site, stage 2: Secondary | ICD-10-CM | POA: Diagnosis not present

## 2016-09-13 DIAGNOSIS — M25775 Osteophyte, left foot: Secondary | ICD-10-CM | POA: Diagnosis not present

## 2016-09-13 DIAGNOSIS — M79672 Pain in left foot: Secondary | ICD-10-CM | POA: Diagnosis not present

## 2016-09-26 DIAGNOSIS — M25775 Osteophyte, left foot: Secondary | ICD-10-CM | POA: Diagnosis not present

## 2016-09-26 DIAGNOSIS — M79672 Pain in left foot: Secondary | ICD-10-CM | POA: Diagnosis not present

## 2016-09-26 DIAGNOSIS — L89892 Pressure ulcer of other site, stage 2: Secondary | ICD-10-CM | POA: Diagnosis not present

## 2016-10-06 ENCOUNTER — Other Ambulatory Visit: Payer: Self-pay | Admitting: Family Medicine

## 2016-10-25 DIAGNOSIS — M25775 Osteophyte, left foot: Secondary | ICD-10-CM | POA: Diagnosis not present

## 2016-10-25 DIAGNOSIS — L89892 Pressure ulcer of other site, stage 2: Secondary | ICD-10-CM | POA: Diagnosis not present

## 2016-10-25 DIAGNOSIS — M79672 Pain in left foot: Secondary | ICD-10-CM | POA: Diagnosis not present

## 2016-12-13 DIAGNOSIS — M79672 Pain in left foot: Secondary | ICD-10-CM | POA: Diagnosis not present

## 2016-12-13 DIAGNOSIS — L89892 Pressure ulcer of other site, stage 2: Secondary | ICD-10-CM | POA: Diagnosis not present

## 2016-12-13 DIAGNOSIS — M25775 Osteophyte, left foot: Secondary | ICD-10-CM | POA: Diagnosis not present

## 2016-12-28 ENCOUNTER — Ambulatory Visit (INDEPENDENT_AMBULATORY_CARE_PROVIDER_SITE_OTHER): Payer: Medicare Other | Admitting: Family Medicine

## 2016-12-28 ENCOUNTER — Encounter: Payer: Self-pay | Admitting: Family Medicine

## 2016-12-28 VITALS — BP 132/70 | Ht 72.0 in | Wt 181.2 lb

## 2016-12-28 DIAGNOSIS — E785 Hyperlipidemia, unspecified: Secondary | ICD-10-CM | POA: Diagnosis not present

## 2016-12-28 DIAGNOSIS — I714 Abdominal aortic aneurysm, without rupture, unspecified: Secondary | ICD-10-CM

## 2016-12-28 DIAGNOSIS — I10 Essential (primary) hypertension: Secondary | ICD-10-CM | POA: Diagnosis not present

## 2016-12-28 DIAGNOSIS — E119 Type 2 diabetes mellitus without complications: Secondary | ICD-10-CM

## 2016-12-28 LAB — POCT GLYCOSYLATED HEMOGLOBIN (HGB A1C): Hemoglobin A1C: 5.1

## 2016-12-28 MED ORDER — LISINOPRIL 10 MG PO TABS: ORAL_TABLET | ORAL | 1 refills | Status: DC

## 2016-12-28 MED ORDER — CHLORZOXAZONE 500 MG PO TABS: 500.0000 mg | ORAL_TABLET | Freq: Three times a day (TID) | ORAL | 1 refills | Status: DC | PRN

## 2016-12-28 MED ORDER — ASPIRIN EC 81 MG PO TBEC: 81.0000 mg | DELAYED_RELEASE_TABLET | Freq: Every day | ORAL | 0 refills | Status: AC

## 2016-12-28 MED ORDER — HYDROCODONE-ACETAMINOPHEN 10-325 MG PO TABS: 1.0000 | ORAL_TABLET | ORAL | 0 refills | Status: DC | PRN

## 2016-12-28 MED ORDER — ATENOLOL 100 MG PO TABS: 100.0000 mg | ORAL_TABLET | Freq: Every day | ORAL | 1 refills | Status: DC

## 2016-12-28 NOTE — Progress Notes (Signed)
   Subjective:    Patient ID: Peter [name], male    DOB: 14-Nov-1941, 76 y.o.   MRN: [id number]  Diabetes  He presents for his follow-up diabetic visit. He has type 2 diabetes mellitus. There are no hypoglycemic associated symptoms. Pertinent negatives for hypoglycemia include no confusion. There are no diabetic associated symptoms. Pertinent negatives for diabetes include no chest pain, no fatigue, no polydipsia, no polyphagia and no weakness. There are no hypoglycemic complications. There are no diabetic complications. There are no known risk factors for coronary artery disease. Current diabetic treatment includes diet. He is compliant with treatment all of the time.   Results for orders placed or performed in visit on 12/28/16  POCT glycosylated hemoglobin (Hb A1C)  Result Value Ref Range   Hemoglobin A1C 5.1    The patient is done a remarkable job of controlling his diabetes with diet and exercise. Has not had to be on any medication recently  Patient has heart disease. He does not want to take statins. And he recently stopped taking aspirin for no particular reason  Patient does get intermittent low back pain and foot pain he uses pain medication rarely for this denies abuse of it. Patient has a history of aortic aneurysm that needs a follow-up ultrasound at the end of March he denies abdominal pain  Review of Systems  Constitutional: Negative for activity change, appetite change and fatigue.  HENT: Negative for congestion.   Respiratory: Negative for cough.   Cardiovascular: Negative for chest pain.  Gastrointestinal: Negative for abdominal pain.  Endocrine: Negative for polydipsia and polyphagia.  Neurological: Negative for weakness.  Psychiatric/Behavioral: Negative for confusion.       Objective:   Physical Exam  Constitutional: He appears well-nourished. No distress.  Cardiovascular: Normal rate, regular rhythm and normal heart sounds.   No murmur heard. Pulmonary/Chest:  Effort normal and breath sounds normal. No respiratory distress.  Musculoskeletal: He exhibits no edema.  Lymphadenopathy:    He has no cervical adenopathy.  Neurological: He is alert.  Psychiatric: His behavior is normal.  Vitals reviewed.         Assessment & Plan:  Diabetes good control with diet and exercise HTN good control continue current measures Intermittent low back pain pain medication prescribed not for frequent use Abdominal aortic aneurysm without rupture follow-up ultrasound at the end of March Known heart disease I encouraged patient to take 81 mg aspirin he states he will at least take it every other day. He was educated about the possibility of a GI bleed Hyperlipidemia encouraged to take a statin does not one of be on any medicines he will do lab work coming up in late February he will follow-up here within 4-6 months Previous labs reviewed with patient

## 2017-02-07 ENCOUNTER — Ambulatory Visit: Payer: Medicare Other | Admitting: Family Medicine

## 2017-03-01 ENCOUNTER — Telehealth: Payer: Self-pay | Admitting: *Deleted

## 2017-03-01 DIAGNOSIS — I714 Abdominal aortic aneurysm, without rupture, unspecified: Secondary | ICD-10-CM

## 2017-03-01 NOTE — Telephone Encounter (Signed)
Follow up Aorta Ultrasound scheduled at [facility]  03/09/17 at 1:30 pm arrive at 1:15pm-patient notified and verbalized understanding.

## 2017-03-09 ENCOUNTER — Ambulatory Visit (HOSPITAL_COMMUNITY): Admission: RE | Admit: 2017-03-09 | Payer: Medicare Other | Source: Ambulatory Visit

## 2017-03-13 ENCOUNTER — Ambulatory Visit (HOSPITAL_COMMUNITY)
Admission: RE | Admit: 2017-03-13 | Discharge: 2017-03-13 | Disposition: A | Discharge status: Home / Self Care | Payer: Medicare Other | Source: Ambulatory Visit | Attending: Family Medicine | Admitting: Family Medicine

## 2017-03-13 DIAGNOSIS — I714 Abdominal aortic aneurysm, without rupture: Secondary | ICD-10-CM | POA: Diagnosis not present

## 2017-03-14 NOTE — Addendum Note (Signed)
Addended by: Dairl Ponder on: 03/14/2017 11:09 AM   Modules accepted: Orders

## 2017-03-28 ENCOUNTER — Ambulatory Visit (INDEPENDENT_AMBULATORY_CARE_PROVIDER_SITE_OTHER): Payer: Medicare Other | Admitting: Family Medicine

## 2017-03-28 DIAGNOSIS — R0789 Other chest pain: Secondary | ICD-10-CM | POA: Diagnosis not present

## 2017-03-28 NOTE — Progress Notes (Signed)
Chest pain-probably related to the dream he had. Please see below. EKG looks good. He is not having any exertional chest pain or shortness of breath currently. Follow-up if ongoing troubles.  Lab work was ordered on last visit he needs to do this.  See assessment and plan above Subjective-patient states he was having a dream several nights ago when he woke up with his arms across his chest and he felt an unusual sensation in his chest he denies it being true angina pain just fell slightly heavy but then it went away he states he is not had any shortness breath denies any chest pressure tightness pain no bradycardia no radiation down the arm and states she's felt well during the day is just when he woke up from sleep E noticed that because of his health history he 1 and to get it checked out  His lungs clear no crackles heart is regular pulse normal BP is good extremities no edema skin warm dry neurologic grossly normal  EKG does not show any acute ST segment changes

## 2017-03-29 ENCOUNTER — Encounter: Payer: Self-pay | Admitting: Family Medicine

## 2017-03-31 ENCOUNTER — Other Ambulatory Visit: Payer: Self-pay | Admitting: Family Medicine

## 2017-04-04 ENCOUNTER — Other Ambulatory Visit: Payer: Self-pay | Admitting: *Deleted

## 2017-04-04 DIAGNOSIS — I10 Essential (primary) hypertension: Secondary | ICD-10-CM | POA: Diagnosis not present

## 2017-04-04 DIAGNOSIS — E785 Hyperlipidemia, unspecified: Secondary | ICD-10-CM

## 2017-04-04 DIAGNOSIS — E119 Type 2 diabetes mellitus without complications: Secondary | ICD-10-CM | POA: Diagnosis not present

## 2017-04-05 ENCOUNTER — Encounter: Payer: Self-pay | Admitting: Family Medicine

## 2017-04-05 LAB — BASIC METABOLIC PANEL
BUN/Creatinine Ratio: 11 (ref 10–24)
BUN: 13 mg/dL (ref 8–27)
CO2: 26 mmol/L (ref 18–29)
Calcium: 9.2 mg/dL (ref 8.6–10.2)
Chloride: 98 mmol/L (ref 96–106)
Creatinine, Ser: 1.16 mg/dL (ref 0.76–1.27)
GFR calc Af Amer: 71 mL/min/{1.73_m2} (ref >59)
GFR calc non Af Amer: 61 mL/min/{1.73_m2} (ref >59)
Glucose: 127 mg/dL — ABNORMAL HIGH (ref 65–99)
Potassium: 4.7 mmol/L (ref 3.5–5.2)
Sodium: 140 mmol/L (ref 134–144)

## 2017-04-05 LAB — HEPATIC FUNCTION PANEL
ALT: 21 IU/L (ref 0–44)
AST: 24 IU/L (ref 0–40)
Albumin: 4.6 g/dL (ref 3.5–4.8)
Alkaline Phosphatase: 68 IU/L (ref 39–117)
Bilirubin Total: 0.7 mg/dL (ref 0.0–1.2)
Bilirubin, Direct: 0.17 mg/dL (ref 0.00–0.40)
Total Protein: 7.5 g/dL (ref 6.0–8.5)

## 2017-04-05 LAB — LIPID PANEL
Chol/HDL Ratio: 5.8 ratio — ABNORMAL HIGH (ref 0.0–5.0)
Cholesterol, Total: 228 mg/dL — ABNORMAL HIGH (ref 100–199)
HDL: 39 mg/dL — ABNORMAL LOW (ref >39)
LDL Calculated: 162 mg/dL — ABNORMAL HIGH (ref 0–99)
Triglycerides: 136 mg/dL (ref 0–149)
VLDL Cholesterol Cal: 27 mg/dL (ref 5–40)

## 2017-04-14 ENCOUNTER — Encounter: Payer: Medicare Other | Admitting: Vascular Surgery

## 2017-04-21 ENCOUNTER — Encounter: Payer: Self-pay | Admitting: Vascular Surgery

## 2017-04-28 ENCOUNTER — Ambulatory Visit (INDEPENDENT_AMBULATORY_CARE_PROVIDER_SITE_OTHER): Payer: Medicare Other | Admitting: Vascular Surgery

## 2017-04-28 VITALS — BP 178/90 | HR 72 | Temp 97.4°F | Resp 16 | Ht 72.0 in | Wt 193.0 lb

## 2017-04-28 DIAGNOSIS — I714 Abdominal aortic aneurysm, without rupture, unspecified: Secondary | ICD-10-CM

## 2017-04-28 NOTE — Progress Notes (Signed)
Patient ID: Peter [name], male   DOB: 1941-11-13, 76 y.o.   MRN: [id number]  Reason for Consult: New Evaluation (Epic referral from [place]. Dr. Wolfgang Phoenix.  AAA increased to 4.5)   Referred by Kathyrn Drown, MD  Subjective:     HPI:  Peter [name] is a 76 y.o. male here for evaluation of abdominal aortic aneurysm. He has known about this for sometime and has been followed with ultrasound. Most recently was found to be 4.5 cm by ultrasound this was in the midportion infrarenally. He does not have any new back or abdominal pain. He does have a history of coronary artery disease status post 7 bypasses in the remote past. He does not have family history of aneurysm. He is a former smoker having quit after his bypass surgery. He is also diabetic. He can walk without limitation is the primary caregiver of his wife. He is not having any complaints related to today's visit.  Past Medical History:  Diagnosis Date  . AAA (abdominal aortic aneurysm) ([place])   . CAD (coronary artery disease)    CABG '97, multiple PCIs  . COPD (chronic obstructive pulmonary disease) ([place])   . Diabetes mellitus    diet controlled  . Dyslipidemia   . Hypertension   . Myocardial infarction 1997   stents x2 (2003/2007)   Family History  Problem Relation Age of Onset  . Other Father        deceased after ?TCS or barium enema, age 41s  . Hypertension Father   . Colon cancer Neg Hx   . Liver disease Neg Hx   . Heart attack Mother    Past Surgical History:  Procedure Laterality Date  . CORONARY ANGIOPLASTY  2003/2007   2 stents  . CORONARY ANGIOPLASTY WITH STENT PLACEMENT  08/14/14   DES to LIMA-LAD insertion  . CORONARY ARTERY BYPASS GRAFT  1997   7 vessels  . CORONARY STENT PLACEMENT  08/15/2014   MID LAD  DES  by Dr Burt Knack  . CYSTECTOMY  2005   top of head-[place]  . EAR CYST EXCISION  07/09/2012   Procedure: CYST REMOVAL;  Surgeon: Jamesetta So, MD;  Location: [facility];  Service: General;   Laterality: N/A;  . LEFT HEART CATHETERIZATION WITH CORONARY/GRAFT ANGIOGRAM N/A 08/14/2014   Procedure: LEFT HEART CATHETERIZATION WITH Beatrix Fetters;  Surgeon: Blane Ohara, MD;  Location: [facility] CATH LAB;  Service: Cardiovascular;  Laterality: N/A;    Short Social History:  Social History  Substance Use Topics  . Smoking status: Former Smoker    Packs/day: 3.00    Years: 40.00    Types: Cigarettes    Quit date: 07/02/2000  . Smokeless tobacco: Never Used  . Alcohol use No     Comment: quit 3 months (01/2013). 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    Allergies  Allergen Reactions  . Dye Fdc Red [Red Dye]     hives  . Ivp Dye [Iodinated Diagnostic Agents] Hives  . Keflex [Cephalexin]     hoarsness  . Plavix [Clopidogrel] Hives    Unsure if dye or Plavix    Current Outpatient Prescriptions  Medication Sig Dispense Refill  . ACCU-CHEK COMPACT PLUS test strip USE TO CHECK BLOOD SUGAR FOUR TIMES DAILY 100 each 5  . atenolol (TENORMIN) 100 MG tablet Take 1 tablet (100 mg total) by mouth daily. 90 tablet 1  . chlorzoxazone (PARAFON) 500 MG tablet Take  1 tablet (500 mg total) by mouth 3 (three) times daily as needed for muscle spasms. 21 tablet 1  . HYDROcodone-acetaminophen (NORCO) 10-325 MG tablet Take 1 tablet by mouth every 4 (four) hours as needed. 30 tablet 0  . ibuprofen (ADVIL,MOTRIN) 200 MG tablet Take 600 mg by mouth daily as needed (pain). Reported on 03/02/2016    . lisinopril (PRINIVIL,ZESTRIL) 10 MG tablet TAKE ONE TABLET BY MOUTH ONCE DAILY 90 tablet 1  . mupirocin ointment (BACTROBAN) 2 % Apply to affected area 3 times daily 22 g 0  . nitroGLYCERIN (NITROSTAT) 0.4 MG SL tablet Place 1 tablet (0.4 mg total) under the tongue every 5 (five) minutes as needed for chest pain. 30 tablet 0   No current facility-administered medications for this visit.     Review of Systems  Constitutional:  Constitutional negative. HENT: HENT negative.    Eyes: Eyes negative.  Respiratory: Respiratory negative.  Cardiovascular: Cardiovascular negative.  GI: Gastrointestinal negative.  GU:       Erectile dysfunction Musculoskeletal: Musculoskeletal negative.  Skin: Skin negative.  Neurological: Neurological negative. Hematologic: Hematologic/lymphatic negative.  Psychiatric: Psychiatric negative.        Objective:  Objective   Vitals:   04/28/17 1311  BP: (!) 178/90  Pulse: 72  Resp: 16  Temp: 97.4 F (36.3 C)  TempSrc: Oral  SpO2: 96%  Weight: 193 lb (87.5 kg)  Height: 6' (1.829 m)   Body mass index is 26.18 kg/m.  Physical Exam  Constitutional: He is oriented to person, place, and time. He appears well-developed.  HENT:  Head: Normocephalic.  Eyes: Pupils are equal, round, and reactive to light.  Neck: Normal range of motion.  Cardiovascular: Normal rate.   Pulses:      Posterior tibial pulses are 1+ on the right side, and 1+ on the left side.  No widened popliteal pulses  Pulmonary/Chest: Effort normal.  Abdominal: Soft. He exhibits no mass.  Musculoskeletal: Normal range of motion. He exhibits no edema.  Neurological: He is alert and oriented to person, place, and time.  Skin: Skin is warm and dry.  Psychiatric: He has a normal mood and affect. His behavior is normal. Judgment and thought content normal.    Data: Ultrasound report demonstrates a 4.2 x 4.5 cm transverse midportion of his abdominal aorta with a 2.97 right common iliac artery and a left common iliac artery max diameter 1.6 cm.     Assessment/Plan:    76 year old presents for his initial evaluation of abdominal aortic aneurysm which has been followed with ultrasound in the past most recently identified to be 4.5 cm. This time he has no new back or abdominal pain and is never had CT scan. I discussed with him the risks of having a 4.5 cm aneurysm and the options for repair in the future. We'll get a CT scan of his chest abdomen and pelvis in 6  months and have him follow-up or we can have more educated discussion of his need for repair and progression of his aneurysm at that time. He demonstrates good understanding and we'll see him in 6 months.     Waynetta Sandy MD [facility]

## 2017-05-12 NOTE — Addendum Note (Signed)
Addended by: Lianne Cure A on: 05/12/2017 09:14 AM   Modules accepted: Orders

## 2017-06-27 ENCOUNTER — Ambulatory Visit (INDEPENDENT_AMBULATORY_CARE_PROVIDER_SITE_OTHER): Payer: Medicare Other | Admitting: Family Medicine

## 2017-06-27 ENCOUNTER — Encounter: Payer: Self-pay | Admitting: Family Medicine

## 2017-06-27 VITALS — BP 138/74 | Ht 72.0 in | Wt 192.1 lb

## 2017-06-27 DIAGNOSIS — I714 Abdominal aortic aneurysm, without rupture, unspecified: Secondary | ICD-10-CM

## 2017-06-27 DIAGNOSIS — E785 Hyperlipidemia, unspecified: Secondary | ICD-10-CM | POA: Diagnosis not present

## 2017-06-27 DIAGNOSIS — E119 Type 2 diabetes mellitus without complications: Secondary | ICD-10-CM | POA: Diagnosis not present

## 2017-06-27 DIAGNOSIS — I10 Essential (primary) hypertension: Secondary | ICD-10-CM | POA: Diagnosis not present

## 2017-06-27 LAB — POCT GLUCOSE (DEVICE FOR HOME USE): POC Glucose: 106 mg/dl — AB (ref 70–99)

## 2017-06-27 LAB — POCT GLYCOSYLATED HEMOGLOBIN (HGB A1C): Hemoglobin A1C: 6.5

## 2017-06-27 MED ORDER — HYDROCODONE-ACETAMINOPHEN 10-325 MG PO TABS: 1.0000 | ORAL_TABLET | ORAL | 0 refills | Status: DC | PRN

## 2017-06-27 MED ORDER — ATENOLOL 100 MG PO TABS: 100.0000 mg | ORAL_TABLET | Freq: Every day | ORAL | 1 refills | Status: DC

## 2017-06-27 MED ORDER — LISINOPRIL 10 MG PO TABS: ORAL_TABLET | ORAL | 1 refills | Status: DC

## 2017-06-27 NOTE — Progress Notes (Signed)
   Subjective:    Patient ID: Peter [name], male    DOB: 08-Aug-1941, 76 y.o.   MRN: [id number]  Diabetes  He presents for his follow-up diabetic visit. He has type 2 diabetes mellitus. No MedicAlert identification noted. Pertinent negatives for hypoglycemia include no headaches. Pertinent negatives for diabetes include no chest pain and no fatigue. He has not had a previous visit with a dietitian. He participates in exercise daily. He does not see a podiatrist.Eye exam is not current.  Aortic aneurysm stable there get a re-looking at it again at the end of the fall he denies any abdominal pain No other concerns.  Results for orders placed or performed in visit on 06/27/17  POCT glycosylated hemoglobin (Hb A1C)  Result Value Ref Range   Hemoglobin A1C 6.5   POCT Glucose (Device for Home Use)  Result Value Ref Range   Glucose Fasting, POC  70 - 99 mg/dL   POC Glucose 106 (A) 70 - 99 mg/dl      Review of Systems  Constitutional: Negative for activity change, fatigue and fever.  Respiratory: Negative for cough and shortness of breath.   Cardiovascular: Negative for chest pain and leg swelling.  Neurological: Negative for headaches.       Objective:   Physical Exam  Constitutional: He appears well-nourished. No distress.  Cardiovascular: Normal rate, regular rhythm and normal heart sounds.   No murmur heard. Pulmonary/Chest: Effort normal and breath sounds normal. No respiratory distress.  Musculoskeletal: He exhibits no edema.  Lymphadenopathy:    He has no cervical adenopathy.  Neurological: He is alert.  Psychiatric: His behavior is normal.  Vitals reviewed. Patient states he does not one to take statins is no longer taking medicine his lab work was reviewed with him including elevated LDL and increased risk of heart attack stroke Patient states he does try to watch diet and tries the do a good job with taking a low starch diet does not one of be on medicines if he can help  it Chronic back pain for which hydrocodone does help he denies abusing it Denies any flareup of heart disease symptoms no chest tightness pressure pain shortness of breath         Assessment & Plan:  Hyperlipidemia I recommend statin patient does not one to be on a statin he will think about it  Diabetes good control watch diet closely recheck the A1c in the fall if the A1c is going up we will need to reinitiate medications possibly metformin patient currently does not want to be on medication once to control it via diet  Chronic back pain uses hydrocodone sparingly new prescription given  Blood pressure good control continue current measures  Stable heart disease  Comprehensive lab work before next follow-up visit

## 2017-06-30 ENCOUNTER — Telehealth: Payer: Self-pay | Admitting: Family Medicine

## 2017-06-30 NOTE — Telephone Encounter (Signed)
Patient saw Dr. Nicki Reaper the other day and told him he would call back with info on diabetic supplies he will need.  Switching to The Accu-Chek Guide blood glucose meter and also will need the accu-chek test strips that go with it. And Lancets also.  Is switching to [facility] in [place] so will need RX sent there for all of above.  *Also, patient said Dr. Nicki Reaper gave him a prescription for HYDROcodone-acetaminophen ([place]) 10-325 MG tablet, and told the patient he could get 3 written prescriptions at a time. He only wanted to get one but has decided to go ahead and get the other 2 printed and would like to pick those up some time next week if possible.  Pt # [phone] (Pt also left a msg for wife Gloria's diabetic supplies)

## 2017-06-30 NOTE — Telephone Encounter (Signed)
Diabetic supplies faxed to pharmacy.

## 2017-07-03 ENCOUNTER — Other Ambulatory Visit: Payer: Self-pay | Admitting: *Deleted

## 2017-07-28 ENCOUNTER — Encounter: Payer: Self-pay | Admitting: Family Medicine

## 2017-07-28 ENCOUNTER — Ambulatory Visit (INDEPENDENT_AMBULATORY_CARE_PROVIDER_SITE_OTHER): Payer: Medicare Other | Admitting: Family Medicine

## 2017-07-28 VITALS — BP 134/84 | Ht 72.0 in | Wt 190.0 lb

## 2017-07-28 DIAGNOSIS — M549 Dorsalgia, unspecified: Secondary | ICD-10-CM

## 2017-07-28 DIAGNOSIS — E119 Type 2 diabetes mellitus without complications: Secondary | ICD-10-CM | POA: Diagnosis not present

## 2017-07-28 DIAGNOSIS — M542 Cervicalgia: Secondary | ICD-10-CM

## 2017-07-28 MED ORDER — HYDROCODONE-ACETAMINOPHEN 10-325 MG PO TABS: ORAL_TABLET | ORAL | 0 refills | Status: DC

## 2017-07-28 NOTE — Patient Instructions (Signed)
If ongoing trouble please call and follow up
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
## 2017-08-09 MED FILL — HYDROCODON-APAP 10-325: 10-325 | 5 days supply | Qty: 30 | Fill #0

## 2017-08-11 ENCOUNTER — Encounter: Payer: Self-pay | Admitting: Family Medicine

## 2017-08-11 ENCOUNTER — Ambulatory Visit (INDEPENDENT_AMBULATORY_CARE_PROVIDER_SITE_OTHER): Payer: Medicare Other | Admitting: Family Medicine

## 2017-08-11 VITALS — BP 132/86 | Temp 98.9°F | Ht 72.0 in | Wt 188.0 lb

## 2017-08-11 DIAGNOSIS — M542 Cervicalgia: Secondary | ICD-10-CM

## 2017-08-11 DIAGNOSIS — J019 Acute sinusitis, unspecified: Secondary | ICD-10-CM

## 2017-08-11 DIAGNOSIS — M546 Pain in thoracic spine: Secondary | ICD-10-CM | POA: Diagnosis not present

## 2017-08-11 MED ORDER — AMOXICILLIN 500 MG PO TABS: 500.0000 mg | ORAL_TABLET | Freq: Three times a day (TID) | ORAL | 0 refills | Status: DC
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
## 2017-08-12 ENCOUNTER — Encounter: Payer: Self-pay | Admitting: Family Medicine

## 2017-09-06 ENCOUNTER — Telehealth: Payer: Self-pay | Admitting: *Deleted

## 2017-09-06 NOTE — Telephone Encounter (Signed)
Called in prep for contrast dye allergy to [facility] [phone]  Prednisone 50 mg po take 13 hrs, 7 hrs , and 1 hr prior to procedure  Benadryl 50 mg  po take 1 hr prior to procedure

## 2017-10-02 DIAGNOSIS — E785 Hyperlipidemia, unspecified: Secondary | ICD-10-CM | POA: Diagnosis not present

## 2017-10-02 DIAGNOSIS — E119 Type 2 diabetes mellitus without complications: Secondary | ICD-10-CM | POA: Diagnosis not present

## 2017-10-03 LAB — MICROALBUMIN / CREATININE URINE RATIO
Creatinine, Urine: 131.3 mg/dL
Microalb/Creat Ratio: 3.7 mg/g creat (ref 0.0–30.0)
Microalbumin, Urine: 4.9 ug/mL

## 2017-10-03 LAB — HEMOGLOBIN A1C
Est. average glucose Bld gHb Est-mCnc: 151 mg/dL
Hgb A1c MFr Bld: 6.9 % — ABNORMAL HIGH (ref 4.8–5.6)

## 2017-10-12 ENCOUNTER — Ambulatory Visit: Payer: Medicare Other | Admitting: Family Medicine

## 2017-10-12 ENCOUNTER — Encounter: Payer: Self-pay | Admitting: Family Medicine

## 2017-10-12 VITALS — BP 124/86 | Ht 72.0 in | Wt 191.4 lb

## 2017-10-12 DIAGNOSIS — I10 Essential (primary) hypertension: Secondary | ICD-10-CM

## 2017-10-12 DIAGNOSIS — E785 Hyperlipidemia, unspecified: Secondary | ICD-10-CM

## 2017-10-12 DIAGNOSIS — E119 Type 2 diabetes mellitus without complications: Secondary | ICD-10-CM | POA: Diagnosis not present

## 2017-10-12 MED ORDER — HYDROCODONE-ACETAMINOPHEN 10-325 MG PO TABS: ORAL_TABLET | ORAL | 0 refills | Status: DC

## 2017-10-12 NOTE — Progress Notes (Signed)
   Subjective:    Patient ID: Peter [name], male    DOB: March 03, 1941, 76 y.o.   MRN: [id number]   Diabetes   He presents for his follow-up diabetic visit. He has type 2 diabetes mellitus. Pertinent negatives for hypoglycemia include no confusion. Pertinent negatives for diabetes include no chest pain, no fatigue, no polydipsia, no polyphagia and no weakness. Risk factors for coronary artery disease include diabetes mellitus and hypertension. Current diabetic treatment includes diet. He is compliant with treatment all of the time. His weight is stable. He is following a diabetic diet. He  has not had a previous visit with a dietitian. He  does not see a podiatrist. Eye exam is not current.   Discuss results of recent labs We will also wasPatient has a history ofHeart attack his denies any chest tightness pressure pain recently states he does take his medicines as directed denies any other particular troubles in regards that  He does relate he thinks his sugars been under pretty good control he does try to watch starches in his diet  He does not take statin he does not want to be on a statin I explained to him at length how that would reduce the risk of another heart attack he does but we will start at this point  He does have a aortic aneurysm he is supposed to get a scan next week he hopes he does not have to have surgery but if he does he is okay with it  Patient does have intermittent back pain for which he uses hydrocodone.  Denies abusing it  Does have blood pressure issues he watches his diet takes his medicine    Review of Systems  Constitutional: Negative for activity change, appetite change and fatigue.  HENT: Negative for congestion.   Respiratory: Negative for cough.   Cardiovascular: Negative for chest pain.  Gastrointestinal: Negative for abdominal pain.  Endocrine: Negative for polydipsia and polyphagia.  Neurological: Negative for weakness.  Psychiatric/Behavioral:  Negative for confusion.       Objective:   Physical Exam  Constitutional: He appears well-nourished. No distress.  Cardiovascular: Normal rate, regular rhythm and normal heart sounds.  No murmur heard. Pulmonary/Chest: Effort normal and breath sounds normal. No respiratory distress.  Musculoskeletal: He exhibits no edema.  Lymphadenopathy:    He has no cervical adenopathy.  Neurological: He is alert.  Psychiatric: His behavior is normal.  Vitals reviewed.    25 minutes was spent with the patient. Greater than half the time was spent in discussion and answering questions and counseling regarding the issues that the patient came in for today.      Assessment & Plan:  Hypertension good control overall blood pressure good  Aortic aneurysm has a scan coming up next week he is open to the idea of surgery if necessary  History of heart attack-I've encouraged him try to get on a statin he wants to try diet but he relates he will recheck lab work in 3 months with follow-up office visit  Hyperlipidemia-she agrees to recheck his blood work watch diet relook again in 3 months may well need to be on a statin he states he will do so at that point if necessary  Diabetes dietary control A1c reasonable patient does not want to be on additional medicine  Patient defers on pneumonia vaccine flu vaccine colonoscopy  Chronic back pain uses hydrocodone responsibly drug registry was checked new prescription given

## 2017-10-19 ENCOUNTER — Ambulatory Visit (HOSPITAL_COMMUNITY): Payer: Medicare Other

## 2017-10-25 ENCOUNTER — Ambulatory Visit (HOSPITAL_COMMUNITY)
Admission: RE | Admit: 2017-10-25 | Discharge: 2017-10-25 | Disposition: A | Discharge status: Home / Self Care | Payer: Medicare Other | Source: Ambulatory Visit | Attending: Vascular Surgery | Admitting: Vascular Surgery

## 2017-10-25 ENCOUNTER — Encounter (HOSPITAL_COMMUNITY): Payer: Self-pay

## 2017-10-25 DIAGNOSIS — I2510 Atherosclerotic heart disease of native coronary artery without angina pectoris: Secondary | ICD-10-CM | POA: Diagnosis not present

## 2017-10-25 DIAGNOSIS — I700 Atherosclerosis of aorta: Secondary | ICD-10-CM | POA: Diagnosis not present

## 2017-10-25 DIAGNOSIS — K4090 Unilateral inguinal hernia, without obstruction or gangrene, not specified as recurrent: Secondary | ICD-10-CM | POA: Insufficient documentation

## 2017-10-25 DIAGNOSIS — K5730 Diverticulosis of large intestine without perforation or abscess without bleeding: Secondary | ICD-10-CM | POA: Insufficient documentation

## 2017-10-25 DIAGNOSIS — I714 Abdominal aortic aneurysm, without rupture, unspecified: Secondary | ICD-10-CM

## 2017-10-25 DIAGNOSIS — I719 Aortic aneurysm of unspecified site, without rupture: Secondary | ICD-10-CM | POA: Insufficient documentation

## 2017-10-25 DIAGNOSIS — J439 Emphysema, unspecified: Secondary | ICD-10-CM | POA: Insufficient documentation

## 2017-10-25 LAB — POCT I-STAT CREATININE: Creatinine, Ser: 1.1 mg/dL (ref 0.61–1.24)

## 2017-10-25 MED ORDER — IOPAMIDOL (ISOVUE-370) INJECTION 76%
100.0000 mL | Freq: Once | INTRAVENOUS | Status: AC | PRN
  Administered 2017-10-25: 100 mL via INTRAVENOUS

## 2017-10-31 DIAGNOSIS — H5212 Myopia, left eye: Secondary | ICD-10-CM | POA: Diagnosis not present

## 2017-10-31 DIAGNOSIS — E119 Type 2 diabetes mellitus without complications: Secondary | ICD-10-CM | POA: Diagnosis not present

## 2017-10-31 DIAGNOSIS — Z961 Presence of intraocular lens: Secondary | ICD-10-CM | POA: Diagnosis not present

## 2017-10-31 DIAGNOSIS — H25812 Combined forms of age-related cataract, left eye: Secondary | ICD-10-CM | POA: Diagnosis not present

## 2017-11-03 ENCOUNTER — Ambulatory Visit: Payer: Medicare Other | Admitting: Vascular Surgery

## 2017-11-09 ENCOUNTER — Other Ambulatory Visit: Payer: Self-pay

## 2017-11-09 MED ORDER — LISINOPRIL 10 MG PO TABS: ORAL_TABLET | ORAL | 1 refills | Status: DC

## 2017-11-20 MED FILL — HYDROCODON-APAP 10-325: 10-325 | 30 days supply | Qty: 60 | Fill #0

## 2017-12-08 ENCOUNTER — Ambulatory Visit (INDEPENDENT_AMBULATORY_CARE_PROVIDER_SITE_OTHER): Payer: Medicare Other | Admitting: Vascular Surgery

## 2017-12-08 ENCOUNTER — Encounter: Payer: Self-pay | Admitting: Vascular Surgery

## 2017-12-08 VITALS — BP 169/93 | HR 69 | Resp 20 | Ht 72.0 in | Wt 191.0 lb

## 2017-12-08 DIAGNOSIS — I714 Abdominal aortic aneurysm, without rupture, unspecified: Secondary | ICD-10-CM

## 2017-12-08 NOTE — Progress Notes (Signed)
Patient ID: Peter [name], male   DOB: Jun 12, 1941, 77 y.o.   MRN: [id number]  Reason for Consult: Follow-up (6 month f/u CTA prior)   Referred by Kathyrn Drown, MD  Subjective:     HPI:  Peter [name] is a 77 y.o. male with known history of abdominal aortic aneurysm known to be infrarenal and 4.5 cm by previous ultrasound.  He has a history of coronary artery disease with bypass in the past.  He does not have a family history of aneurysm.  He is a former smoker but did quit.  He does not have new back or abdominal pain.  He walks without limitation.  Chief complaint today is erectile dysfunction with no other changes in his health history since last year.  Past Medical History:  Diagnosis Date  . AAA (abdominal aortic aneurysm) ([place])   . CAD (coronary artery disease)    CABG '97, multiple PCIs  . COPD (chronic obstructive pulmonary disease) ([place])   . Diabetes mellitus    diet controlled  . Dyslipidemia   . Hypertension   . Myocardial infarction [facility]) 1997   stents x2 (2003/2007)   Family History  Problem Relation Age of Onset  . Other Father        deceased after ?TCS or barium enema, age 77s  . Hypertension Father   . Heart attack Mother   . Colon cancer Neg Hx   . Liver disease Neg Hx    Past Surgical History:  Procedure Laterality Date  . CORONARY ANGIOPLASTY  2003/2007   2 stents  . CORONARY ANGIOPLASTY WITH STENT PLACEMENT  08/14/14   DES to LIMA-LAD insertion  . CORONARY ARTERY BYPASS GRAFT  1997   7 vessels  . CORONARY STENT PLACEMENT  08/15/2014   MID LAD  DES  by Dr Burt Knack  . CYSTECTOMY  2005   top of head-[place]  . EAR CYST EXCISION  07/09/2012   Procedure: CYST REMOVAL;  Surgeon: Jamesetta So, MD;  Location: [facility];  Service: General;  Laterality: N/A;  . LEFT HEART CATHETERIZATION WITH CORONARY/GRAFT ANGIOGRAM N/A 08/14/2014   Procedure: LEFT HEART CATHETERIZATION WITH Beatrix Fetters;  Surgeon: Blane Ohara, MD;  Location: [facility] CATH LAB;   Service: Cardiovascular;  Laterality: N/A;    Short Social History:  Social History   Tobacco Use  . Smoking status: Former Smoker    Packs/day: 3.00    Years: 40.00    Pack years: 120.00    Types: Cigarettes    Last attempt to quit: 07/02/2000    Years since quitting: 17.4  . Smokeless tobacco: Never Used  Substance Use Topics  . Alcohol use: No    Alcohol/week: 6.0 oz    Types: 10 Cans of beer per week    Comment: quit 3 months (01/2013). 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    Allergies  Allergen Reactions  . Dye Fdc Red [Red Dye]     hives  . Ivp Dye [Iodinated Diagnostic Agents] Hives  . Keflex [Cephalexin]     hoarsness  . Plavix [Clopidogrel] Hives    Unsure if dye or Plavix    Current Outpatient Medications  Medication Sig Dispense Refill  . atenolol (TENORMIN) 100 MG tablet Take 1 tablet (100 mg total) by mouth daily. 90 tablet 1  . chlorzoxazone (PARAFON) 500 MG tablet Take 1 tablet (500 mg total) by mouth 3 (three) times daily as needed for muscle spasms. 21  tablet 1  . HYDROcodone-acetaminophen ([place]CO) 10-325 MG tablet 1 bid prn pain 60 tablet 0  . ibuprofen (ADVIL,MOTRIN) 200 MG tablet Take 600 mg by mouth daily as needed (pain). Reported on 03/02/2016    . lisinopril (PRINIVIL,ZESTRIL) 10 MG tablet TAKE ONE TABLET BY MOUTH ONCE DAILY 90 tablet 1  . nitroGLYCERIN (NITROSTAT) 0.4 MG SL tablet Place 1 tablet (0.4 mg total) under the tongue every 5 (five) minutes as needed for chest pain. 30 tablet 0   No current facility-administered medications for this visit.     Review of Systems  Constitutional:  Constitutional negative. HENT: HENT negative.  Eyes: Eyes negative.  Respiratory: Respiratory negative.  Cardiovascular: Cardiovascular negative.  GI: Gastrointestinal negative.  GU:       ED Skin: Skin negative.  Neurological: Neurological negative. Hematologic: Hematologic/lymphatic negative.  Psychiatric: Psychiatric negative.         Objective:  Objective   Vitals:   12/08/17 1431  BP: (!) 169/93  Pulse: 69  Resp: 20  SpO2: 97%  Weight: 191 lb (86.6 kg)  Height: 6' (1.829 m)   Body mass index is 25.9 kg/m.  Physical Exam  Constitutional: He is oriented to person, place, and time. He appears well-developed.  HENT:  Head: Normocephalic.  Eyes: Pupils are equal, round, and reactive to light.  Neck: Normal range of motion. Neck supple.  Cardiovascular: Normal rate.  Pulses:      Carotid pulses are 2+ on the right side, and 2+ on the left side.      Popliteal pulses are 2+ on the right side, and 2+ on the left side.  Pulmonary/Chest: Effort normal.  Abdominal: Soft. He exhibits mass.  Musculoskeletal: Normal range of motion. He exhibits no edema.  Neurological: He is alert and oriented to person, place, and time.  Skin: Skin is warm and dry.  Psychiatric: He has a normal mood and affect. His behavior is normal. Judgment and thought content normal.    Data: IMPRESSION: CTA CHEST  1. No significant thoracic aortic aneurysm, dissection or other acute abnormality. 2. 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 3. Aortic and coronary artery atherosclerosis. 4. Small bilateral pulmonary nodules. No follow-up needed if patient is low-risk (and has no known or suspected primary neoplasm). Non-contrast chest CT can be considered in 12 months if patient is high-risk. This recommendation follows the consensus statement: Guidelines for Management of Incidental Pulmonary Nodules Detected on CT Images: From the Fleischner Society 2017; Radiology 2017; 284:228-243. CTA ABD/PELVIS  1. Fusiform aneurysmal dilatation of the infrarenal abdominal aorta with a maximal diameter of 4.3 cm. Recommend followup by ultrasound in 1 year. This recommendation follows ACR consensus guidelines: White Paper of the ACR Incidental Findings Committee II on Vascular Findings. J Am Coll Radiol  2013; 10:789-794. 2. Multiple ill-defined avidly enhancing lesions are identified scattered throughout both the right and left lobes of the liver. The liver is not overtly cirrhotic and therefore hepatocellular carcinoma is considered unlikely. This imaging appearance is nonspecific and may reflect a benign entity such as a multiple flash fill capillary hemangiomas, or transient hepatic attenuation differences (THADs). However, if the patient has a prior history of malignancy, certain metastatic disease could appear similar. If the patient is considered high risk (history of prior malignancy) then further evaluation with gadolinium-enhanced MRI of the abdomen is recommended. If the patient has no known history of malignancy, then further evaluation could be considered with a repeat liver protocol contrast-enhanced CT scan of the abdomen in  3-6 months. 3. 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 4. Irregular, ulcerated atherosclerotic plaque throughout the abdominal aorta. 5. 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 Significant stenosis of a tiny accessory artery to the right upper pole results in focal atrophy of the medial aspect of the upper pole. 6. Colonic diverticular disease without CT evidence of active inflammation. 7. Small fat containing right inguinal hernia 8. Additional ancillary findings as above.     Assessment/Plan:    77 year old man abdominal aortic aneurysm 4.3 cm by CTA today.  We discussed the risk benefits and when we would fix this went over 5.5cm.  Should he have back or abdominal pain he should seek medical evaluation.  We will otherwise we will see him in 1 year with repeat abdominal aortic aneurysm duplex.  He demonstrates good understanding.     Waynetta Sandy MD [facility]

## 2017-12-13 NOTE — Addendum Note (Signed)
Addended by: Lianne Cure A on: 12/13/2017 12:14 PM   Modules accepted: Orders

## 2018-01-29 ENCOUNTER — Ambulatory Visit: Payer: Medicare Other | Admitting: Family Medicine

## 2018-03-06 ENCOUNTER — Ambulatory Visit (INDEPENDENT_AMBULATORY_CARE_PROVIDER_SITE_OTHER): Payer: Medicare Other | Admitting: Family Medicine

## 2018-03-06 ENCOUNTER — Encounter: Payer: Self-pay | Admitting: Family Medicine

## 2018-03-06 VITALS — BP 160/70 | Temp 98.4°F | Ht 72.0 in | Wt 195.1 lb

## 2018-03-06 DIAGNOSIS — L03317 Cellulitis of buttock: Secondary | ICD-10-CM

## 2018-03-06 DIAGNOSIS — W57XXXA Bitten or stung by nonvenomous insect and other nonvenomous arthropods, initial encounter: Secondary | ICD-10-CM

## 2018-03-06 MED ORDER — KETOCONAZOLE 2 % EX CREA: 1.0000 "application " | TOPICAL_CREAM | Freq: Two times a day (BID) | CUTANEOUS | 0 refills | Status: DC

## 2018-03-06 MED ORDER — DOXYCYCLINE HYCLATE 100 MG PO CAPS: 100.0000 mg | ORAL_CAPSULE | Freq: Two times a day (BID) | ORAL | 0 refills | Status: DC

## 2018-03-06 NOTE — Progress Notes (Signed)
   Subjective:    Patient ID: Peter [name], male    DOB: [birth year], 77 y.o.   MRN: [id number]  HPI Patient is here today with complaints of a tick bite in groin area. States found it last Friday. He cant see if it is a bump or a tick as he removed from his buttock on Yesterday am and he removed it. Tick bite because of redness on the left buttock area also has another smaller bite on the lower leg not causing any trouble denies high fever chills sweats wheezing difficulty breathing also has a groin rash PMH benign  Review of Systems  Constitutional: Negative for activity change, fatigue and fever.  HENT: Negative for congestion and rhinorrhea.   Respiratory: Negative for cough and shortness of breath.   Cardiovascular: Negative for chest pain.  Gastrointestinal: Negative for abdominal pain, diarrhea, nausea and vomiting.  Genitourinary: Negative for dysuria and hematuria.  Neurological: Negative for weakness and headaches.  Psychiatric/Behavioral: Negative for confusion.       Objective:   Physical Exam  Constitutional: He appears well-nourished. No distress.  HENT:  Head: Normocephalic and atraumatic.  Eyes: Right eye exhibits no discharge. Left eye exhibits no discharge.  Neck: No tracheal deviation present.  Cardiovascular: Normal rate, regular rhythm and normal heart sounds.  No murmur heard. Pulmonary/Chest: Effort normal and breath sounds normal. No respiratory distress. He has no wheezes.  Musculoskeletal: He exhibits no edema.  Lymphadenopathy:    He has no cervical adenopathy.  Neurological: He is alert.  Skin: Skin is warm. No rash noted.  Psychiatric: His behavior is normal.  Vitals reviewed.         Assessment & Plan:  Tick bite doxycycline twice daily 7 days follow-up if ongoing trouble warning signs discussed I do not feel the patient has any severe disease currently just localized cellulitis from a tick bite  Tinea in the groin region Nizoral cream as  directed should gradually get better  Due to fluctuating sugars check glucoses twice daily send [place] readings

## 2018-04-09 ENCOUNTER — Ambulatory Visit: Payer: Medicare Other | Admitting: Family Medicine

## 2018-04-09 ENCOUNTER — Encounter: Payer: Self-pay | Admitting: Family Medicine

## 2018-04-09 VITALS — BP 132/84 | Ht 72.0 in | Wt 193.6 lb

## 2018-04-09 DIAGNOSIS — I10 Essential (primary) hypertension: Secondary | ICD-10-CM | POA: Diagnosis not present

## 2018-04-09 DIAGNOSIS — E119 Type 2 diabetes mellitus without complications: Secondary | ICD-10-CM | POA: Diagnosis not present

## 2018-04-09 DIAGNOSIS — J449 Chronic obstructive pulmonary disease, unspecified: Secondary | ICD-10-CM | POA: Diagnosis not present

## 2018-04-09 DIAGNOSIS — E785 Hyperlipidemia, unspecified: Secondary | ICD-10-CM | POA: Diagnosis not present

## 2018-04-09 MED ORDER — TRAMADOL HCL 50 MG PO TABS: ORAL_TABLET | ORAL | 1 refills | Status: DC

## 2018-04-09 MED ORDER — HYDROCODONE-ACETAMINOPHEN 10-325 MG PO TABS: ORAL_TABLET | ORAL | 0 refills | Status: DC

## 2018-04-09 NOTE — Patient Instructions (Signed)
Restart 81 mg aspirin daily

## 2018-04-09 NOTE — Progress Notes (Signed)
Subjective:    Patient ID: Peter [name], male    DOB: 1941/05/10, 77 y.o.   MRN: [id number]   Hypertension   This is a chronic problem. The current episode started more than 1 year ago. Pertinent negatives include no chest pain, headaches or shortness of breath. Risk factors for coronary artery disease include male gender. Treatments tried: lisinopril, atenolol.  There are no compliance problems.    This patient was seen today for chronic pain  The medication list was reviewed and updated.   -Compliance with medication: Good compliance  - Number patient states they take daily: Takes 1 or 2 every day when he needs it only  -when was the last dose patient took?  Has not taken in the past few days because he does not use it on a regular basis  The patient was advised the importance of maintaining medication and not using illegal substances with these.  Here for refills and follow up  The patient was educated that we can provide 3 monthly scripts for their medication, it is their responsibility to follow the instructions.  Side effects or complications from medications: Denies any side effects  Patient is aware that pain medications are meant to minimize the severity of the pain to allow their pain levels to improve to allow for better function. They are aware of that pain medications cannot totally remove their pain.  Due for UDT ( at least once per year) : Will be due later this year     The patient was seen today as part of a comprehensive diabetic check up.The patient relates medication compliance. No significant side effects to the medications. Denies any low glucose spells. Relates compliance with diet to a reasonable level. Patient does do labwork intermittently and understands the dangers of diabetes.  Diabetes is diet controlled.  Patient does state at times her sugars does go up  Patient here for follow-up regarding cholesterol.  Patient does try to maintain a reasonable  diet.  Patient does take the medication on a regular basis.  Denies missing a dose.  The patient denies any obvious side effects.  Prior blood work results reviewed with the patient.  The patient is aware of his cholesterol goals and the need to keep it under good control to lessen the risk of disease. Patient does not tolerate statins and therefore is not taking any statins.  We do need to check lab work  Patient has history of heart disease denies any chest tightness pressure pain shortness of breath denies swelling in the legs we will go ahead and look at risk factors including A1c and lipid.  Patient has not seen cardiologist in the past couple years but has not had any specific need to do so  Patient has a history of smoking patient no longer smokes Patient does have COPD at Uses albuterol on it in a infrequent basis  Pain registry was checked Review of Systems  Constitutional: Negative for activity change, appetite change and fatigue.  HENT: Negative for congestion and rhinorrhea.   Respiratory: Negative for cough, chest tightness and shortness of breath.   Cardiovascular: Negative for chest pain and leg swelling.  Gastrointestinal: Negative for abdominal pain, diarrhea and nausea.  Endocrine: Negative for polydipsia and polyphagia.  Genitourinary: Negative for dysuria and hematuria.  Neurological: Negative for weakness and headaches.  Psychiatric/Behavioral: Negative for confusion and dysphoric mood.       Objective:   Physical Exam  Constitutional: He appears well-nourished. No  distress.  Cardiovascular: Normal rate, regular rhythm and normal heart sounds.  No murmur heard. Pulmonary/Chest: Effort normal and breath sounds normal. No respiratory distress.  Musculoskeletal: He exhibits no edema.  Lymphadenopathy:    He has no cervical adenopathy.  Neurological: He is alert.  Psychiatric: His behavior is normal.  Vitals reviewed.         Assessment & Plan:  HTN- Patient  was seen today as part of a visit regarding hypertension. The importance of healthy diet and regular physical activity was discussed. The importance of compliance with medications discussed.  Ideal goal is to keep blood pressure low elevated levels certainly below 035/59 when possible.  The patient was counseled that keeping blood pressure under control lessen his risk of heart attack, stroke, kidney failure, and early death.  The importance of regular follow-ups was discussed with the patient.  Low-salt diet such as DASH recommended. Regular physical activity was recommended as well.  Patient was advised to keep regular follow-ups.  The patient was seen today as part of an evaluation regarding hyperlipidemia.  Recent lab work has been reviewed with the patient as well as the goals for good cholesterol care.  In addition to this medications have been discussed the importance of compliance with diet and medications discussed as well.  Finally the patient is aware that poor control of cholesterol, noncompliance can dramatically increase her risk of heart attack strokes and premature death.  The patient will keep regular office visits and the patient does agreed to periodic lab work. Patient unable to tolerate statins but we will check lipid profile  The patient was seen today as part of a comprehensive visit for diabetes. The importance of keeping her A1c at or below 7 was discussed.  Importance of regular physical activity was discussed.   The importance of adherence to medication as well as a controlled low starch/sugar diet was also discussed.  Standard follow-up visit recommended.  Finally failure to follow good diabetic measures including self effort and compliance with recommendations can certainly increase the risk of heart disease strokes kidney failure blindness loss of limb and early death was discussed with the patient. Diabetes is diet controlled we will check A1c  COPD overall under  good control use albuterol as needed does not smoke anymore  Benefit of 81 mg aspirin outweighs her risk this was discussed with the patient he will start the 81 mg aspirin  Chronic pain and discomfort he will try tramadol he does not want to use hydrocodone because it causes severe constipation drug registry was checked  Follow-up 3 months

## 2018-04-12 ENCOUNTER — Observation Stay (HOSPITAL_COMMUNITY): Payer: Medicare Other

## 2018-04-12 ENCOUNTER — Emergency Department (HOSPITAL_COMMUNITY): Payer: Medicare Other

## 2018-04-12 ENCOUNTER — Inpatient Hospital Stay (HOSPITAL_COMMUNITY)
Admission: EM | Admit: 2018-04-12 | Discharge: 2018-04-17 | DRG: 065 | Disposition: A | Discharge status: Home Health | Payer: Medicare Other | Attending: Internal Medicine | Admitting: Internal Medicine

## 2018-04-12 ENCOUNTER — Observation Stay (HOSPITAL_BASED_OUTPATIENT_CLINIC_OR_DEPARTMENT_OTHER): Payer: Medicare Other

## 2018-04-12 ENCOUNTER — Encounter (HOSPITAL_COMMUNITY): Payer: Self-pay | Admitting: Internal Medicine

## 2018-04-12 ENCOUNTER — Other Ambulatory Visit: Payer: Self-pay

## 2018-04-12 DIAGNOSIS — Z8249 Family history of ischemic heart disease and other diseases of the circulatory system: Secondary | ICD-10-CM

## 2018-04-12 DIAGNOSIS — I5043 Acute on chronic combined systolic (congestive) and diastolic (congestive) heart failure: Secondary | ICD-10-CM | POA: Diagnosis not present

## 2018-04-12 DIAGNOSIS — I252 Old myocardial infarction: Secondary | ICD-10-CM

## 2018-04-12 DIAGNOSIS — E119 Type 2 diabetes mellitus without complications: Secondary | ICD-10-CM

## 2018-04-12 DIAGNOSIS — Z79899 Other long term (current) drug therapy: Secondary | ICD-10-CM | POA: Diagnosis not present

## 2018-04-12 DIAGNOSIS — Z9119 Patient's noncompliance with other medical treatment and regimen: Secondary | ICD-10-CM | POA: Diagnosis not present

## 2018-04-12 DIAGNOSIS — Z87891 Personal history of nicotine dependence: Secondary | ICD-10-CM | POA: Diagnosis not present

## 2018-04-12 DIAGNOSIS — I429 Cardiomyopathy, unspecified: Secondary | ICD-10-CM | POA: Diagnosis not present

## 2018-04-12 DIAGNOSIS — R2689 Other abnormalities of gait and mobility: Secondary | ICD-10-CM | POA: Diagnosis not present

## 2018-04-12 DIAGNOSIS — I2510 Atherosclerotic heart disease of native coronary artery without angina pectoris: Secondary | ICD-10-CM

## 2018-04-12 DIAGNOSIS — E1142 Type 2 diabetes mellitus with diabetic polyneuropathy: Secondary | ICD-10-CM

## 2018-04-12 DIAGNOSIS — R2990 Unspecified symptoms and signs involving the nervous system: Secondary | ICD-10-CM | POA: Diagnosis not present

## 2018-04-12 DIAGNOSIS — J449 Chronic obstructive pulmonary disease, unspecified: Secondary | ICD-10-CM | POA: Diagnosis not present

## 2018-04-12 DIAGNOSIS — R7989 Other specified abnormal findings of blood chemistry: Secondary | ICD-10-CM | POA: Diagnosis present

## 2018-04-12 DIAGNOSIS — E1165 Type 2 diabetes mellitus with hyperglycemia: Secondary | ICD-10-CM | POA: Diagnosis not present

## 2018-04-12 DIAGNOSIS — E785 Hyperlipidemia, unspecified: Secondary | ICD-10-CM | POA: Diagnosis not present

## 2018-04-12 DIAGNOSIS — I2582 Chronic total occlusion of coronary artery: Secondary | ICD-10-CM | POA: Diagnosis not present

## 2018-04-12 DIAGNOSIS — Z955 Presence of coronary angioplasty implant and graft: Secondary | ICD-10-CM | POA: Diagnosis not present

## 2018-04-12 DIAGNOSIS — I6340 Cerebral infarction due to embolism of unspecified cerebral artery: Principal | ICD-10-CM | POA: Diagnosis present

## 2018-04-12 DIAGNOSIS — E1151 Type 2 diabetes mellitus with diabetic peripheral angiopathy without gangrene: Secondary | ICD-10-CM | POA: Diagnosis not present

## 2018-04-12 DIAGNOSIS — I10 Essential (primary) hypertension: Secondary | ICD-10-CM | POA: Diagnosis present

## 2018-04-12 DIAGNOSIS — E1122 Type 2 diabetes mellitus with diabetic chronic kidney disease: Secondary | ICD-10-CM

## 2018-04-12 DIAGNOSIS — G8194 Hemiplegia, unspecified affecting left nondominant side: Secondary | ICD-10-CM | POA: Diagnosis present

## 2018-04-12 DIAGNOSIS — R748 Abnormal levels of other serum enzymes: Secondary | ICD-10-CM

## 2018-04-12 DIAGNOSIS — R531 Weakness: Secondary | ICD-10-CM | POA: Diagnosis not present

## 2018-04-12 DIAGNOSIS — I639 Cerebral infarction, unspecified: Secondary | ICD-10-CM | POA: Diagnosis not present

## 2018-04-12 DIAGNOSIS — R29700 NIHSS score 0: Secondary | ICD-10-CM | POA: Diagnosis present

## 2018-04-12 DIAGNOSIS — I739 Peripheral vascular disease, unspecified: Secondary | ICD-10-CM

## 2018-04-12 DIAGNOSIS — R471 Dysarthria and anarthria: Secondary | ICD-10-CM | POA: Diagnosis not present

## 2018-04-12 DIAGNOSIS — R29810 Facial weakness: Secondary | ICD-10-CM | POA: Diagnosis not present

## 2018-04-12 DIAGNOSIS — I110 Hypertensive heart disease with heart failure: Secondary | ICD-10-CM | POA: Diagnosis not present

## 2018-04-12 DIAGNOSIS — R29818 Other symptoms and signs involving the nervous system: Secondary | ICD-10-CM | POA: Diagnosis not present

## 2018-04-12 DIAGNOSIS — R778 Other specified abnormalities of plasma proteins: Secondary | ICD-10-CM | POA: Diagnosis present

## 2018-04-12 DIAGNOSIS — I69952 Hemiplegia and hemiparesis following unspecified cerebrovascular disease affecting left dominant side: Secondary | ICD-10-CM | POA: Diagnosis not present

## 2018-04-12 DIAGNOSIS — I5042 Chronic combined systolic (congestive) and diastolic (congestive) heart failure: Secondary | ICD-10-CM | POA: Diagnosis not present

## 2018-04-12 DIAGNOSIS — I6523 Occlusion and stenosis of bilateral carotid arteries: Secondary | ICD-10-CM | POA: Diagnosis not present

## 2018-04-12 DIAGNOSIS — I6789 Other cerebrovascular disease: Secondary | ICD-10-CM | POA: Diagnosis not present

## 2018-04-12 DIAGNOSIS — R410 Disorientation, unspecified: Secondary | ICD-10-CM | POA: Diagnosis not present

## 2018-04-12 LAB — I-STAT CHEM 8, ED
BUN: 14 mg/dL (ref 6–20)
Calcium, Ion: 1.19 mmol/L (ref 1.15–1.40)
Chloride: 99 mmol/L — ABNORMAL LOW (ref 101–111)
Creatinine, Ser: 1.1 mg/dL (ref 0.61–1.24)
Glucose, Bld: 201 mg/dL — ABNORMAL HIGH (ref 65–99)
HCT: 43 % (ref 39.0–52.0)
Hemoglobin: 14.6 g/dL (ref 13.0–17.0)
Potassium: 4.1 mmol/L (ref 3.5–5.1)
Sodium: 135 mmol/L (ref 135–145)
TCO2: 24 mmol/L (ref 22–32)

## 2018-04-12 LAB — COMPREHENSIVE METABOLIC PANEL
ALT: 39 U/L (ref 17–63)
AST: 31 U/L (ref 15–41)
Albumin: 4.1 g/dL (ref 3.5–5.0)
Alkaline Phosphatase: 79 U/L (ref 38–126)
Anion gap: 10 (ref 5–15)
BUN: 15 mg/dL (ref 6–20)
CO2: 25 mmol/L (ref 22–32)
Calcium: 9.6 mg/dL (ref 8.9–10.3)
Chloride: 98 mmol/L — ABNORMAL LOW (ref 101–111)
Creatinine, Ser: 1.09 mg/dL (ref 0.61–1.24)
GFR calc Af Amer: >60 mL/min (ref >60)
GFR calc non Af Amer: >60 mL/min (ref >60)
Glucose, Bld: 202 mg/dL — ABNORMAL HIGH (ref 65–99)
Potassium: 4.1 mmol/L (ref 3.5–5.1)
Sodium: 133 mmol/L — ABNORMAL LOW (ref 135–145)
Total Bilirubin: 0.8 mg/dL (ref 0.3–1.2)
Total Protein: 7.8 g/dL (ref 6.5–8.1)

## 2018-04-12 LAB — CBC
HCT: 41.9 % (ref 39.0–52.0)
Hemoglobin: 14.8 g/dL (ref 13.0–17.0)
MCH: 32.5 pg (ref 26.0–34.0)
MCHC: 35.3 g/dL (ref 30.0–36.0)
MCV: 92.1 fL (ref 78.0–100.0)
Platelets: 124 10*3/uL — ABNORMAL LOW (ref 150–400)
RBC: 4.55 MIL/uL (ref 4.22–5.81)
RDW: 12.7 % (ref 11.5–15.5)
WBC: 7.2 10*3/uL (ref 4.0–10.5)

## 2018-04-12 LAB — URINALYSIS, ROUTINE W REFLEX MICROSCOPIC
Bacteria, UA: NONE SEEN
Bilirubin Urine: NEGATIVE
Glucose, UA: 50 mg/dL — AB
Ketones, ur: NEGATIVE mg/dL
Leukocytes, UA: NEGATIVE
Nitrite: NEGATIVE
Protein, ur: NEGATIVE mg/dL
Specific Gravity, Urine: 1.012 (ref 1.005–1.030)
pH: 7 (ref 5.0–8.0)

## 2018-04-12 LAB — APTT: aPTT: 42 seconds — ABNORMAL HIGH (ref 24–36)

## 2018-04-12 LAB — GLUCOSE, CAPILLARY
Glucose-Capillary: 194 mg/dL — ABNORMAL HIGH (ref 65–99)
Glucose-Capillary: 200 mg/dL — ABNORMAL HIGH (ref 65–99)
Glucose-Capillary: 185 mg/dL — ABNORMAL HIGH (ref 65–99)
Glucose-Capillary: 200 mg/dL — ABNORMAL HIGH (ref 65–99)

## 2018-04-12 LAB — DIFFERENTIAL
Basophils Absolute: 0 10*3/uL (ref 0.0–0.1)
Basophils Relative: 0 %
Eosinophils Absolute: 0.3 10*3/uL (ref 0.0–0.7)
Eosinophils Relative: 4 %
Lymphocytes Relative: 42 %
Lymphs Abs: 3.1 10*3/uL (ref 0.7–4.0)
Monocytes Absolute: 0.7 10*3/uL (ref 0.1–1.0)
Monocytes Relative: 10 %
Neutro Abs: 3.2 10*3/uL (ref 1.7–7.7)
Neutrophils Relative %: 44 %

## 2018-04-12 LAB — RAPID URINE DRUG SCREEN, HOSP PERFORMED
Amphetamines: NOT DETECTED
Barbiturates: NOT DETECTED
Benzodiazepines: NOT DETECTED
Cocaine: NOT DETECTED
Opiates: NOT DETECTED
Tetrahydrocannabinol: NOT DETECTED

## 2018-04-12 LAB — TROPONIN I
Troponin I: 0.06 ng/mL (ref <0.03)
Troponin I: 0.87 ng/mL (ref <0.03)
Troponin I: 0.86 ng/mL (ref <0.03)

## 2018-04-12 LAB — PROTIME-INR
INR: 1.04
Prothrombin Time: 13.5 seconds (ref 11.4–15.2)

## 2018-04-12 LAB — PHOSPHORUS: Phosphorus: 3.1 mg/dL (ref 2.5–4.6)

## 2018-04-12 LAB — ETHANOL: Alcohol, Ethyl (B): <10 mg/dL (ref <10)

## 2018-04-12 LAB — ECHOCARDIOGRAM COMPLETE
Height: 72 in
Weight: 3088 oz

## 2018-04-12 LAB — MAGNESIUM: Magnesium: 1.8 mg/dL (ref 1.7–2.4)

## 2018-04-12 MED ORDER — ENOXAPARIN SODIUM 40 MG/0.4ML ~~LOC~~ SOLN
40.0000 mg | SUBCUTANEOUS | Status: DC
  Administered 2018-04-12 – 2018-04-13 (×2): 40 mg via SUBCUTANEOUS
  Filled 2018-04-12 (×2): qty 0.4

## 2018-04-12 MED ORDER — ACETAMINOPHEN 325 MG PO TABS
650.0000 mg | ORAL_TABLET | ORAL | Status: DC | PRN
  Administered 2018-04-14 – 2018-04-16 (×5): 650 mg via ORAL
  Filled 2018-04-12 (×5): qty 2

## 2018-04-12 MED ORDER — ONDANSETRON HCL 4 MG/2ML IJ SOLN
4.0000 mg | Freq: Four times a day (QID) | INTRAMUSCULAR | Status: DC | PRN
  Administered 2018-04-12: 4 mg via INTRAVENOUS
  Filled 2018-04-12: qty 2

## 2018-04-12 MED ORDER — SODIUM CHLORIDE 0.9 % IV SOLN
INTRAVENOUS | Status: AC
  Administered 2018-04-12: 15:00:00 via INTRAVENOUS

## 2018-04-12 MED ORDER — ENOXAPARIN SODIUM 40 MG/0.4ML ~~LOC~~ SOLN: 40.0000 mg | SUBCUTANEOUS | Status: DC

## 2018-04-12 MED ORDER — STROKE: EARLY STAGES OF RECOVERY BOOK
Freq: Once | Status: AC
  Administered 2018-04-14: 06:00:00
  Filled 2018-04-12 (×2): qty 1

## 2018-04-12 MED ORDER — ASPIRIN EC 325 MG PO TBEC
325.0000 mg | DELAYED_RELEASE_TABLET | Freq: Every day | ORAL | Status: DC
  Administered 2018-04-13 – 2018-04-17 (×4): 325 mg via ORAL
  Filled 2018-04-12 (×5): qty 1

## 2018-04-12 MED ORDER — ACETAMINOPHEN 650 MG RE SUPP: 650.0000 mg | RECTAL | Status: DC | PRN

## 2018-04-12 MED ORDER — ACETAMINOPHEN 160 MG/5ML PO SOLN
650.0000 mg | ORAL | Status: DC | PRN
  Filled 2018-04-12: qty 20.3

## 2018-04-12 MED ORDER — ONDANSETRON HCL 4 MG PO TABS: 4.0000 mg | ORAL_TABLET | Freq: Four times a day (QID) | ORAL | Status: DC | PRN

## 2018-04-12 MED ORDER — ASPIRIN EC 81 MG PO TBEC: 81.0000 mg | DELAYED_RELEASE_TABLET | Freq: Every day | ORAL | Status: DC

## 2018-04-12 MED ORDER — ASPIRIN 81 MG PO CHEW
324.0000 mg | CHEWABLE_TABLET | Freq: Once | ORAL | Status: AC
  Administered 2018-04-12: 324 mg via ORAL
  Filled 2018-04-12: qty 4

## 2018-04-12 MED ORDER — IOPAMIDOL (ISOVUE-370) INJECTION 76%
100.0000 mL | Freq: Once | INTRAVENOUS | Status: AC | PRN
  Administered 2018-04-12: 100 mL via INTRAVENOUS

## 2018-04-12 MED ORDER — INSULIN ASPART 100 UNIT/ML ~~LOC~~ SOLN
0.0000 [IU] | Freq: Three times a day (TID) | SUBCUTANEOUS | Status: DC
  Administered 2018-04-12 (×2): 3 [IU] via SUBCUTANEOUS
  Administered 2018-04-13: 2 [IU] via SUBCUTANEOUS
  Administered 2018-04-13: 3 [IU] via SUBCUTANEOUS
  Administered 2018-04-13: 5 [IU] via SUBCUTANEOUS
  Administered 2018-04-14: 3 [IU] via SUBCUTANEOUS
  Administered 2018-04-14 – 2018-04-16 (×6): 2 [IU] via SUBCUTANEOUS
  Administered 2018-04-16: 3 [IU] via SUBCUTANEOUS
  Administered 2018-04-17: 07:00:00 5 [IU] via SUBCUTANEOUS
  Administered 2018-04-17: 13:00:00 3 [IU] via SUBCUTANEOUS

## 2018-04-12 MED ORDER — LABETALOL HCL 5 MG/ML IV SOLN
INTRAVENOUS | Status: AC
  Administered 2018-04-12: 20 mg
  Filled 2018-04-12: qty 4

## 2018-04-12 NOTE — Progress Notes (Signed)
Pt was in procedure area at 0830 when Q2 neuro checks and vitals were due. Q2 restarted when patient returned to the floor at 1005.

## 2018-04-12 NOTE — Progress Notes (Signed)
Peter [name] is a 77 y.o. 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  Patient seen and evaluated this morning with no active concerns or complaints.   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  He is currently undergoing 2D echocardiogram and denies any chest pain or shortness of breath but does have some mild nausea.  His troponin is also noted to be elevated further at 0.87.  Given his prior history of CAD and prior MI with stents and lack of aspirin use at home, cardiology has been consulted for further evaluation.  Neurology consultation pending for stroke evaluation.

## 2018-04-12 NOTE — Consult Note (Addendum)
   [facility] Tele[facility]urology Consult Services  Impression: Stroke -   Not a tpa candidate due to: patient is outside of window last known normal was  Not an NIR candidate due to:  No CTA H/N and CT Perfusion finding of large vessel occlusion.  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     Differential Diagnosis:   1. Cardioembolic stroke  2. Small vessel disease/lacune  3. Thromboembolic, artery-to-artery mechanism  4. Hypercoagulable state-related infarct  5. Transient ischemic attack  6. Thrombotic mechanism, large artery disease   Comments:   [facility] contacted:  2:37 [facility] at bedside: 2:46 pm NIHSS assessment time: 2:50 pm CT head negative DT: 0224  Recommendations:  BP < 220/110 but allow for permissive hypertension inpatient neurology consultation Inpatient stroke evaluation as per Neurology/ Internal Medicine Discussed with ED MD  -----------------------------------------------------------------------------------------  CC  History of Present Illness   Patient is a  77 year old male that comes in with a history of left sided, weakness, that was noted after he sat down normal at 10 pm, and got up and was weak in the left leg at 11 pm.  BS 201. HE has a hsitory of HTN, AAA. Pts diastolic bp was gettting high.   Diagnostic:  CT head is negative.   Exam:  NIHSS score:   LOC - 0, alert, LOC - 0, commands, LOC - 0 questions, HG -0, UE -1 - left arm  VF - 1,  left leg - 2, Ataxia - 0,  Sensory - 1, aphasia - 0, ataxia - 0, Extinction -0       Medical Decision Making:  - Extensive number of diagnosis or management options are considered above.   - Extensive amount of complex data reviewed.   - High risk of complication and/or morbidity or mortality are associated with differential diagnostic considerations above.  - There may be Uncertain outcome and increased probability of prolonged functional impairment or high probability of severe prolonged  functional impairment associated with some of these differential diagnosis.  Medical Data Reviewed:  1.Data reviewed include clinical labs, radiology,  Medical Tests;   2.Tests results discussed w/performing or interpreting physician;   3.Obtaining/reviewing old medical records;  4.Obtaining case history from another source;  5.Independent review of image, tracing or specimen.    Patient was informed the Neurology Consult would happen via telehealth (remote video) and consented to receiving care in this manner.

## 2018-04-12 NOTE — ED Triage Notes (Signed)
Pt in by [facility] with c/o weakness to his right leg.  Upon ems arrival, pt had good grips bilaterally.  Within a few minutes, pt had left arm weakness.  Pt c/o left arm and left leg weakness and states "I feel like I can't talk right on the left side of my mouth".  Pt speech is clear, and he denies pain or headache.

## 2018-04-12 NOTE — ED Notes (Signed)
EKG done and seen by Dr Wickline 

## 2018-04-12 NOTE — Consult Note (Signed)
Cardiology Consultation:   Patient ID: Peter [name]; [id number]; 10/23/1941   Admit date: 04/12/2018 Date of Consult: 04/12/2018  Primary Care Provider: Kathyrn Drown, MD Primary Cardiologist: Harl Bowie, last visit 2015.  Primary Electrophysiologist:  na   Patient Profile:   Peter [name] is a 77 y.o. male with a hx of CAD who is being seen today for the evaluation of elevated troponin at the request of Dr Manuella Ghazi.  History of Present Illness:   Peter [name] 77 yo male history of CAD with CABG in 1997, stent to SVG-PDA and PTCA of the LAD in 05/2002. Normal LVEF at that time. 2008 heart cath [facility] had repeat stenting, he is unsure of the details. History of AAA, HTN, HL, copd. Admitted with right leg weakness. As part of workup a troponin was checked and found to be elevated  Last cath 2015. Occluded LAD, LCX, RCA. Patent SVG-OM1 and OM2, SVG-ramu, SVG-diag, LIMA-LAD. Occluded SVG-PDA. Severe stenosis of mid LAD at LIMA inseration, received DES. LVEF 45% by LV gram.   He denies any cardiopulmonary symptoms. No chest pain, no SOB, no DOE. Fairly sedentary lifestyle, highest level of activity is doing housework and tending to his yard which he has not noticed any recent symptoms doing.    WBC 7.2 Hgb 14.8 Plt 124 K 4.1 Cr 1.09 Mg 1.8 04/2018 echo: LVEF 30-35%, multiple WMAs, grade II diastolic dysfunction Trop 0.06-->0.87--> CXR no acute process CT head no acute process CTA neck: mild carotid stenosis MRI small acute infarct right corona radiata ABIs: Right 1, Left 0.84 Carotid US <50% biltateral disease.   Past Medical History:  Diagnosis Date  . AAA (abdominal aortic aneurysm) ([place])   . CAD (coronary artery disease)    CABG '97, multiple PCIs  . COPD (chronic obstructive pulmonary disease) ([place])   . Diabetes mellitus    diet controlled  . Dyslipidemia   . Hypertension   . Myocardial infarction [facility]) 1997   stents x2 (2003/2007)    Past Surgical History:  Procedure  Laterality Date  . CORONARY ANGIOPLASTY  2003/2007   2 stents  . CORONARY ANGIOPLASTY WITH STENT PLACEMENT  08/14/14   DES to LIMA-LAD insertion  . CORONARY ARTERY BYPASS GRAFT  1997   7 vessels  . CORONARY STENT PLACEMENT  08/15/2014   MID LAD  DES  by Dr Burt Knack  . CYSTECTOMY  2005   top of head-Jenkins  . EAR CYST EXCISION  07/09/2012   Procedure: CYST REMOVAL;  Surgeon: Jamesetta So, MD;  Location: [facility];  Service: General;  Laterality: N/A;  . LEFT HEART CATHETERIZATION WITH CORONARY/GRAFT ANGIOGRAM N/A 08/14/2014   Procedure: LEFT HEART CATHETERIZATION WITH Beatrix Fetters;  Surgeon: Blane Ohara, MD;  Location: [facility] CATH LAB;  Service: Cardiovascular;  Laterality: N/A;      Inpatient Medications: Scheduled Meds: .  stroke: mapping our early stages of recovery book   Does not apply Once  . [START ON 04/13/2018] aspirin EC  81 mg Oral Daily  . enoxaparin (LOVENOX) injection  40 mg Subcutaneous Q24H  . insulin aspart  0-15 Units Subcutaneous TID WC   Continuous Infusions:  PRN Meds: acetaminophen **OR** acetaminophen (TYLENOL) oral liquid 160 mg/5 mL **OR** acetaminophen, ondansetron **OR** ondansetron (ZOFRAN) IV  Allergies:    Allergies  Allergen Reactions  . Dye Fdc Red [Red Dye]     hives  . Ivp Dye [Iodinated Diagnostic Agents] Hives  . Keflex [Cephalexin]     hoarsness  .  Plavix [Clopidogrel] Hives    Unsure if dye or Plavix    Social History:   Social History   Socioeconomic History  . Marital status: Married    Spouse name: Not on file  . Number of children: Not on file  . Years of education: Not on file  . Highest education level: Not on file  Occupational History  . Not on file  Social Needs  . Financial resource strain: Not on file  . Food insecurity:    Worry: Not on file    Inability: Not on file  . Transportation needs:    Medical: Not on file    Non-medical: Not on file  Tobacco Use  . Smoking status: Former Smoker     Packs/day: 3.00    Years: 40.00    Pack years: 120.00    Types: Cigarettes    Last attempt to quit: 07/02/2000    Years since quitting: 17.7  . Smokeless tobacco: Never Used  Substance and Sexual Activity  . Alcohol use: No    Alcohol/week: 6.0 oz    Types: 10 Cans of beer per week    Comment: quit 3 months (01/2013). 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  . Drug use: No  . Sexual activity: Never    Birth control/protection: None  Lifestyle  . Physical activity:    Days per week: Not on file    Minutes per session: Not on file  . Stress: Not on file  Relationships  . Social connections:    Talks on phone: Not on file    Gets together: Not on file    Attends religious service: Not on file    Active member of club or organization: Not on file    Attends meetings of clubs or organizations: Not on file    Relationship status: Not on file  . Intimate partner violence:    Fear of current or ex partner: Not on file    Emotionally abused: Not on file    Physically abused: Not on file    Forced sexual activity: Not on file  Other Topics Concern  . Not on file  Social History Narrative  . Not on file    Family History:    Family History  Problem Relation Age of Onset  . Other Father        deceased after ?TCS or barium enema, age 17s  . Hypertension Father   . Heart attack Mother   . Colon cancer Neg Hx   . Liver disease Neg Hx      ROS:  Please see the history of present illness.  All other ROS reviewed and negative.     Physical Exam/Data:   Vitals:   04/12/18 0630 04/12/18 1005 04/12/18 1205 04/12/18 1405  BP: (!) 172/101 (!) 152/88 (!) 145/91 119/74  Pulse: 92 80 84 75  Resp: 18 18 20 16   Temp: 98 F (36.7 C) 97.8 F (36.6 C) 97.6 F (36.4 C) 98.1 F (36.7 C)  TempSrc: Oral Oral Oral Oral  SpO2: 96% 100% 95% 97%  Weight:      Height:        Intake/Output Summary (Last 24 hours) at 04/12/2018 1433 Last data filed at 04/12/2018  1428 Gross per 24 hour  Intake 240 ml  Output 750 ml  Net -510 ml   Filed Weights   04/12/18 0257  Weight: 193 lb (87.5 kg)   Body mass index is 26.18  kg/m.  General:  Well nourished, well developed, in no acute distress HEENT: normal Lymph: no adenopathy Neck: no JVD Endocrine:  No thryomegaly Cardiac:  normal S1, S2; RRR; no murmur Lungs:  clear to auscultation bilaterally, no wheezing, rhonchi or rales  Abd: soft, nontender, no hepatomegaly  Ext: no edema Musculoskeletal:  No deformities, BUE and BLE strength normal and equal Skin: warm and dry  Neuro:  Right leg weakness Psych:  Normal affect   Laboratory Data:  Chemistry Recent Labs  Lab 04/12/18 0234 04/12/18 0246  NA 133* 135  K 4.1 4.1  CL 98* 99*  CO2 25  --   GLUCOSE 202* 201*  BUN 15 14  CREATININE 1.09 1.10  CALCIUM 9.6  --   GFRNONAA >60  --   GFRAA >60  --   ANIONGAP 10  --     Recent Labs  Lab 04/12/18 0234  PROT 7.8  ALBUMIN 4.1  AST 31  ALT 39  ALKPHOS 79  BILITOT 0.8   Hematology Recent Labs  Lab 04/12/18 0234 04/12/18 0246  WBC 7.2  --   RBC 4.55  --   HGB 14.8 14.6  HCT 41.9 43.0  MCV 92.1  --   MCH 32.5  --   MCHC 35.3  --   RDW 12.7  --   PLT 124*  --    Cardiac Enzymes Recent Labs  Lab 04/12/18 0234 04/12/18 1006  TROPONINI 0.06* 0.87*   No results for input(s): TROPIPOC in the last 168 hours.  BNPNo results for input(s): BNP, PROBNP in the last 168 hours.  DDimer No results for input(s): DDIMER in the last 168 hours.  Radiology/Studies:  Ct Angio Head W Or Wo Contrast  Result Date: 04/12/2018 CLINICAL DATA:  Initial evaluation for acute left-sided weakness. EXAM: CT ANGIOGRAPHY HEAD AND NECK TECHNIQUE: Multidetector CT imaging of the head and neck was performed using the standard protocol during bolus administration of intravenous contrast. Multiplanar CT image reconstructions and MIPs were obtained to evaluate the vascular anatomy. Carotid stenosis measurements  (when applicable) are obtained utilizing NASCET criteria, using the distal internal carotid diameter as the denominator. CONTRAST:  110mL ISOVUE-370 IOPAMIDOL (ISOVUE-370) INJECTION 76% COMPARISON:  Prior head CT from earlier same day. FINDINGS: CTA NECK FINDINGS Aortic arch: Visualized arch of normal caliber with normal Aadith Raudenbush pattern. Atherosclerotic change about the arch and origin of the great vessels without significant stenosis. Visualized subclavian arteries patent. Right carotid system: Right common carotid artery patent to the bifurcation without stenosis. Mixed plaque about the proximal right ICA with associated narrowing of up to 40% by NASCET criteria. Right ICA patent distally to the skull base. Left carotid system: Left common carotid artery patent to the bifurcation without significant stenosis. Atheromatous plaque about the proximal left ICA with associated mild narrowing of no more than 30% by NASCET criteria. Left ICA widely patent distally to the skull base. Vertebral arteries: Both of the vertebral arteries arise from the subclavian arteries. Atheromatous plaque at the origin of the vertebral arteries without hemodynamically significant stenosis. Vertebral arteries otherwise widely patent within the neck without stenosis, dissection, or occlusion. Skeleton: No acute osseous abnormality. 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 No worrisome lytic or blastic osseous lesions. Median sternotomy wires noted. Other neck: No acute soft tissue abnormality within the neck. Salivary and thyroid glands are normal. No adenopathy. Upper chest: Visualized upper mediastinum within normal limits. Emphysema noted. Superimposed diffuse ground-glass opacity with interlobular septal thickening, most suggestive  of edema. Review of the MIP images confirms the above findings CTA HEAD FINDINGS Anterior circulation: Petrous segments widely patent. Scattered atheromatous plaque within the  cavernous/supraclinoid ICAs. Mild-to-moderate stenosis at the para clinoid segments bilaterally. ICA termini widely patent. A1 segments, anterior communicating artery, and anterior cerebral arteries widely patent. M1 segments patent without stenosis. No proximal M2 occlusion. Distal MCA branches well perfused and symmetric. Posterior circulation: Right vertebral artery patent to the vertebrobasilar junction without stenosis. Atheromatous plaque at the left V4 segment with mild to moderate stenosis. Posterior inferior cerebral arteries patent bilaterally. Basilar widely patent to its distal aspect. Superior cerebellar arteries patent bilaterally. Tandem moderate to severe mid-distal left P2 stenoses. PCAs otherwise widely patent. Venous sinuses: Patent. Anatomic variants: None significant. Delayed phase: No abnormal enhancement. Review of the MIP images confirms the above findings IMPRESSION: 1. Negative CTA for emergent large vessel occlusion. 2. Atheromatous plaque about the proximal ICAs with relatively mild stenosis of up to 30-40% by NASCET criteria, right slightly worse than left. 3. Carotid siphon atherosclerotic change with resultant mild to moderate stenoses at the para clinoid segments. 4. Tandem moderate to severe mid-distal left P2 stenoses. 5. Emphysema with findings suggestive of superimposed pulmonary edema. Electronically Signed   By: Jeannine Boga M.D.   On: 04/12/2018 04:06   Dg Chest 2 View  Result Date: 04/12/2018 CLINICAL DATA:  STROKE-LIKE SYMPTOMS, SORE THROAT, HTN, FORMER SMOKER X 20 YEARS EXAM: CHEST - 2 VIEW COMPARISON:  08/15/2014 FINDINGS: Low lung volumes. Resulting crowding of bronchovascular structures. Left infrahilar subsegmental atelectasis or infiltrate. Heart size and mediastinal contours are within normal limits. Aortic Atherosclerosis (ICD10-170.0) No effusion. Previous median sternotomy and CABG. IMPRESSION: 1. Low volumes with left infrahilar atelectasis or  infiltrate. Electronically Signed   By: Lucrezia Europe M.D.   On: 04/12/2018 09:51   Ct Angio Neck W And/or Wo Contrast  Result Date: 04/12/2018 CLINICAL DATA:  Initial evaluation for acute left-sided weakness. EXAM: CT ANGIOGRAPHY HEAD AND NECK TECHNIQUE: Multidetector CT imaging of the head and neck was performed using the standard protocol during bolus administration of intravenous contrast. Multiplanar CT image reconstructions and MIPs were obtained to evaluate the vascular anatomy. Carotid stenosis measurements (when applicable) are obtained utilizing NASCET criteria, using the distal internal carotid diameter as the denominator. CONTRAST:  152mL ISOVUE-370 IOPAMIDOL (ISOVUE-370) INJECTION 76% COMPARISON:  Prior head CT from earlier same day. FINDINGS: CTA NECK FINDINGS Aortic arch: Visualized arch of normal caliber with normal Caryl Fate pattern. Atherosclerotic change about the arch and origin of the great vessels without significant stenosis. Visualized subclavian arteries patent. Right carotid system: Right common carotid artery patent to the bifurcation without stenosis. Mixed plaque about the proximal right ICA with associated narrowing of up to 40% by NASCET criteria. Right ICA patent distally to the skull base. Left carotid system: Left common carotid artery patent to the bifurcation without significant stenosis. Atheromatous plaque about the proximal left ICA with associated mild narrowing of no more than 30% by NASCET criteria. Left ICA widely patent distally to the skull base. Vertebral arteries: Both of the vertebral arteries arise from the subclavian arteries. Atheromatous plaque at the origin of the vertebral arteries without hemodynamically significant stenosis. Vertebral arteries otherwise widely patent within the neck without stenosis, dissection, or occlusion. Skeleton: No acute osseous abnormality. 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 No worrisome lytic or  blastic osseous lesions. Median sternotomy wires noted. Other neck: No acute soft tissue abnormality within the neck. Salivary and thyroid  glands are normal. No adenopathy. Upper chest: Visualized upper mediastinum within normal limits. Emphysema noted. Superimposed diffuse ground-glass opacity with interlobular septal thickening, most suggestive of edema. Review of the MIP images confirms the above findings CTA HEAD FINDINGS Anterior circulation: Petrous segments widely patent. Scattered atheromatous plaque within the cavernous/supraclinoid ICAs. Mild-to-moderate stenosis at the para clinoid segments bilaterally. ICA termini widely patent. A1 segments, anterior communicating artery, and anterior cerebral arteries widely patent. M1 segments patent without stenosis. No proximal M2 occlusion. Distal MCA branches well perfused and symmetric. Posterior circulation: Right vertebral artery patent to the vertebrobasilar junction without stenosis. Atheromatous plaque at the left V4 segment with mild to moderate stenosis. Posterior inferior cerebral arteries patent bilaterally. Basilar widely patent to its distal aspect. Superior cerebellar arteries patent bilaterally. Tandem moderate to severe mid-distal left P2 stenoses. PCAs otherwise widely patent. Venous sinuses: Patent. Anatomic variants: None significant. Delayed phase: No abnormal enhancement. Review of the MIP images confirms the above findings IMPRESSION: 1. Negative CTA for emergent large vessel occlusion. 2. Atheromatous plaque about the proximal ICAs with relatively mild stenosis of up to 30-40% by NASCET criteria, right slightly worse than left. 3. Carotid siphon atherosclerotic change with resultant mild to moderate stenoses at the para clinoid segments. 4. Tandem moderate to severe mid-distal left P2 stenoses. 5. Emphysema with findings suggestive of superimposed pulmonary edema. Electronically Signed   By: Jeannine Boga M.D.   On: 04/12/2018 04:06    Mr Brain Wo Contrast  Result Date: 04/12/2018 CLINICAL DATA:  Left-sided weakness and confusion for 2 days EXAM: MRI HEAD WITHOUT CONTRAST MRA HEAD WITHOUT CONTRAST TECHNIQUE: Multiplanar, multiecho pulse sequences of the brain and surrounding structures were obtained without intravenous contrast. Angiographic images of the head were obtained using MRA technique without contrast. COMPARISON:  Head CT from earlier today FINDINGS: MRI HEAD FINDINGS Brain: Restricted diffusion in the right corona radiata region the caudate body measuring 12 mm. Mild cerebral volume loss. Mild chronic small vessel ischemia in the cerebral white matter. No hemorrhage, hydrocephalus, or collection. Vascular: Arterial findings below. Preserved dural venous sinus flow voids. Skull and upper cervical spine: No evidence of marrow lesion. Sinuses/Orbits: Right cataract resection. MRA HEAD FINDINGS Symmetric carotid and vertebral arteries. Incomplete circle-of-Willis by MRA. No Desha Bitner occlusion or proximal flow limiting stenosis. Atheromatous irregularity of medium size vessels, most notably a moderate left P2 segment stenosis. Negative for aneurysm. IMPRESSION: Brain MRI: 1. 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 2. Mild chronic small vessel ischemia in the cerebral white matter. Intracranial MRA: No acute finding.  Stable from CTA earlier today. Electronically Signed   By: Monte Fantasia M.D.   On: 04/12/2018 08:22   US Carotid Bilateral (at [facility] And [facility] Only)  Result Date: 04/12/2018 CLINICAL DATA:  Left facial and lower extremity weakness. CTA describes bilateral bifurcation plaque with only mild stenosis. EXAM: BILATERAL CAROTID DUPLEX ULTRASOUND TECHNIQUE: Pearline Cables scale imaging, color Doppler and duplex ultrasound was performed of bilateral carotid and vertebral arteries in the neck. COMPARISON:  CTA from the same day TECHNIQUE: Quantification of carotid stenosis is based on velocity parameters that  correlate the residual internal carotid diameter with NASCET-based stenosis levels, using the diameter of the distal internal carotid lumen as the denominator for stenosis measurement. The following velocity measurements were obtained: PEAK SYSTOLIC/END DIASTOLIC RIGHT ICA:                     70/19cm/sec CCA:  55/73UK/GUR SYSTOLIC ICA/CCA RATIO:  1.0 ECA:                     86cm/sec LEFT ICA:                     62/17cm/sec CCA:                     42/70WC/BJS SYSTOLIC ICA/CCA RATIO:  0.9 ECA:                     60cm/sec FINDINGS: RIGHT CAROTID ARTERY: Mild plaque through the common carotid artery. Partially calcified eccentric plaque in the carotid bulb extending into proximal internal and external carotid arteries. Some areas of distal acoustic shadowing. Normal waveforms and color Doppler signal in visualized segments. RIGHT VERTEBRAL ARTERY:  Normal flow direction and waveform. LEFT CAROTID ARTERY: Plaque throughout the common carotid artery without high-grade stenosis. Eccentric partially calcified plaque in the distal common carotid artery, bulb, extending into the proximal ICA. No high-grade stenosis. Normal waveforms and color Doppler signal. LEFT VERTEBRAL ARTERY: Normal flow direction and waveform. IMPRESSION: 1. Bilateral carotid bifurcation and proximal ICA plaque, right greater than left, resulting in less than 50% diameter stenosis. 2.  Antegrade bilateral vertebral arterial flow. Electronically Signed   By: Lucrezia Europe M.D.   On: 04/12/2018 09:49   US Arterial Abi (screening Lower Extremity)  Result Date: 04/12/2018 CLINICAL DATA:  77 year old male with suspected peripheral arterial disease EXAM: NONINVASIVE PHYSIOLOGIC VASCULAR STUDY OF BILATERAL LOWER EXTREMITIES TECHNIQUE: Evaluation of both lower extremities were performed at rest, including calculation of ankle-brachial indices with single level Doppler, pressure and pulse volume recording. COMPARISON:  None. FINDINGS:  Right ABI:  1.0 Left ABI:  0.84 Right Lower Extremity:  Normal arterial waveforms at the ankle. Left Lower Extremity: Mildly blunted arterial waveforms at the ankle. 0.8-0.89 Mild PAD IMPRESSION: 1. Abnormal resting left ankle-brachial index of 0.84 consistent with at least mild peripheral arterial disease. 2. Normal resting right ankle-brachial index of 1.0 Electronically Signed   By: Jacqulynn Cadet M.D.   On: 04/12/2018 08:52   Mr Jodene Nam Head/brain EG Cm  Result Date: 04/12/2018 CLINICAL DATA:  Left-sided weakness and confusion for 2 days EXAM: MRI HEAD WITHOUT CONTRAST MRA HEAD WITHOUT CONTRAST TECHNIQUE: Multiplanar, multiecho pulse sequences of the brain and surrounding structures were obtained without intravenous contrast. Angiographic images of the head were obtained using MRA technique without contrast. COMPARISON:  Head CT from earlier today FINDINGS: MRI HEAD FINDINGS Brain: Restricted diffusion in the right corona radiata region the caudate body measuring 12 mm. Mild cerebral volume loss. Mild chronic small vessel ischemia in the cerebral white matter. No hemorrhage, hydrocephalus, or collection. Vascular: Arterial findings below. Preserved dural venous sinus flow voids. Skull and upper cervical spine: No evidence of marrow lesion. Sinuses/Orbits: Right cataract resection. MRA HEAD FINDINGS Symmetric carotid and vertebral arteries. Incomplete circle-of-Willis by MRA. No Janayla Marik occlusion or proximal flow limiting stenosis. Atheromatous irregularity of medium size vessels, most notably a moderate left P2 segment stenosis. Negative for aneurysm. IMPRESSION: Brain MRI: 1. 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 2. Mild chronic small vessel ischemia in the cerebral white matter. Intracranial MRA: No acute finding.  Stable from CTA earlier today. Electronically Signed   By: Monte Fantasia M.D.   On: 04/12/2018 08:22   Ct Head Code Stroke Wo Contrast  Result Date:  04/12/2018 CLINICAL DATA:  Code stroke.  Initial evaluation for acute left-sided weakness. EXAM: CT HEAD WITHOUT CONTRAST TECHNIQUE: Contiguous axial images were obtained from the base of the skull through the vertex without intravenous contrast. COMPARISON:  Prior MRI from 07/31/2014. FINDINGS: Brain: Age-related cerebral atrophy with mild chronic small vessel ischemic change. No acute intracranial hemorrhage. No acute large vessel territory infarct. No mass lesion, midline shift or mass effect. No hydrocephalus. No extra-axial fluid collection. Vascular: No asymmetric hyperdense vessel. Calcified atherosclerotic change at the skull base. Skull: Scalp soft tissues within normal limits.  Calvarium intact. Sinuses/Orbits: Globes and orbital soft tissues within normal limits. Paranasal sinuses and mastoid air cells are clear. Other: None. ASPECTS [facility] Stroke Program Early CT Score) - Ganglionic level infarction (caudate, lentiform nuclei, internal capsule, insula, M1-M3 cortex): 7 - Supraganglionic infarction (M4-M6 cortex): 3 Total score (0-10 with 10 being normal): 10 IMPRESSION: 1. No acute intracranial infarct or other abnormality identified. 2. ASPECTS is 10. 3. Mild age-related cerebral atrophy with chronic small vessel ischemic disease. Critical Value/emergent results were called by telephone at the time of interpretation on 04/12/2018 at 2:40 am to Dr. Ripley Fraise , who verbally acknowledged these results. Electronically Signed   By: Jeannine Boga M.D.   On: 04/12/2018 02:41    08/2014 cath PROCEDURAL FINDINGS Hemodynamics: AO 152/77 with a mean of 109 LV 156/18              Coronary angiography: Coronary dominance: right  Left mainstem: The left main is calcified and diffusely diseased with scattered 30% stenosis.  Left anterior descending (LAD): The LAD is severely calcified. The proximal vessel is totally occluded at the level of the first septal perforator. There is very severe  calcification visualized of the mid LAD.  Left circumflex (LCx): The left circumflex is calcified. The vessel is totally occluded in its proximal portion. The first OM Gwendolen Hewlett is visualized and is critically diseased and very small throughout with 99% stenosis and it visually appears to be less than 1 mm in diameter  Right coronary artery (RCA): 100% occlusion proximally  Saphenous vein sequential to OM 1 and 2. The vein graft is patent throughout. It is diffusely irregular without any areas of high-grade stenosis. The distal native coronary vessels are patent but very small in caliber.  Saphenous vein graft sequential to diagonal 1 ramus intermedius: This graft is also patent. There is 20-30% stenosis in the proximal body of the graft. The diagonal Namiyah Grantham is tiny. The intermediate Sabel Hornbeck is patent the anastomotic site, but there is severe stenosis where it bifurcates into 2 very small subbranches with 90% stenosis at each Denyse Fillion origin. These branches are not amenable to PCI because of their small caliber  Saphenous vein graft to PDA and PLA: Total occlusion in the proximal body the graft within the stented segment  LIMA to LAD: The LIMA graft is widely patent throughout. The graft is moderately tortuous. The LAD retrograde fills back through its midportion. 2 diagonals fill from this graft. Distal to the graft insertion site, there is 90% stenosis just beyond the graft insertion site with mild diffuse disease leading into a stented segment that covers the mid LAD and to the apical portion of the LAD. The stent is small in caliber that appears patent throughout with only mild diffuse in-stent restenosis.  Left ventriculography: The anterolateral and apical LV wall segments contract vigorously. There is akinesis of the inferior wall from base to near the apex. The LVEF is estimated at 45%. There is no significant mitral  regurgitation appreciated.  PCI Note:  Following the diagnostic procedure,  the decision was made to proceed with PCI of the native LAD through the LIMA graft near the graft insertion site.  Weight-based heparin was given for anticoagulation. Once a therapeutic ACT (270) was achieved, a 5 [place] LIMA guide catheter was inserted.  A cougar coronary guidewire was used to cross the lesion.  The lesion was predilated with a 2.0 x 15 mm balloon.  The lesion was then stented with a 2.25x18 mm Xience DES.  The stent was postdilated with a 2.5 mm noncompliant balloon.  Following PCI, there was 0% residual stenosis and TIMI-3 flow. Final angiography confirmed an excellent result. The patient tolerated the procedure well. There were no immediate procedural complications. A TR band was used for radial hemostasis. The patient was transferred to the post catheterization recovery area for further monitoring.  PCI Data: Vessel - LAD/Segment - mid (LIMA insertion site) Percent Stenosis (pre)  90 TIMI-flow 3 Stent 2.25 x 18 mm Xience DES Percent Stenosis (post) 0 TIMI-flow (post) 3  Contrast: 120 cc Ominipaque  Radiation dose/Fluoro time: 20.8 min  Estimated Blood Loss: minimal  Final Conclusions:   1. Severe native three-vessel coronary artery disease with total occlusion of the LAD, total occlusion left circumflex, and total occlusion of the RCA  2. Status post aortocoronary bypass surgery with continued patency of the saphenous vein graft sequential to OM1 and OM 2, saphenous vein graft sequential to ramus intermedius and first diagonal, and LIMA to LAD  3. Total occlusion of the saphenous vein graft to PDA and PLA  4. Severe stenosis of the mid LAD at the LIMA insertion site treated successfully with PCI using a single drug-eluting stent  5. Mild to moderate segmental LV systolic dysfunction with inferior wall akinesis and an estimated LVEF of 45%   Recommendations:  The patient is Plavix allergic. Recommend treatment with aspirin and effient for 12  months.   Assessment and Plan:   1. Elevated troponin/CAD - patient presented with leg weakness, as part of workup troponin was checked and found to be elevated. - up to 0.87, has not established peak - Echo LVEF 30-35%. No recent echos in the system, unclear duration of his systolic dyfunction. LVgram from 2015 LVEF 45% - of note patient also presented with severe HTN 190s/110s.  - difficult to evaluate elevated troponin in absence of clinic context. No cardiopulmonary symptoms. He LVEF has not been checked in sometime but does appear decreased from 2015 (based on LV gram, no recent echo). The chronicity of this is unclear. This could be chronic, and his trop elevation due to severe HTN and his CVA  - would start hep gtt if ok with neuro. Medical therapy with ASA. He has plavix allergy. Beta blocker and ACE-I on hold for permissive HTN. He will require a cath at some point due to his drop in LVEF. Will decide on timing based on trop trend. Could consider as intpatient vs outpatient after he has had some time to recover from his CVA.    2. CVA  - per primary team   For questions or updates, please contact [place] Please consult [email or web address] for contact info under Cardiology/STEMI.   Merrily Pew, MD  04/12/2018 2:33 PM

## 2018-04-12 NOTE — H&P (Signed)
4        History and Physical    Peter [name] AYT:[id number] DOB: June 07, 1941 DOA: 04/12/2018  PCP: Kathyrn Drown, MD  Patient coming from: Home.  I have personally briefly reviewed patient's old medical records in [place]  Chief Complaint: Right leg weakness.  HPI: Peter [name] is a 77 y.o. male with medical history significant of AAA, CAD/CABG in 15 with multiple PCI's, history of MI, history of stent placement x2 in 2003 in 2007, COPD, type 2 diabetes, dyslipidemia, hypertension who is coming to the emergency department with complaints of right lower extremity weakness associated with left facial weakness while he was watching TV.  No blurred vision, no dizziness, no slurred speech, no headache.  He denies fever, chills, cough, sore throat, chest pain, palpitations, diaphoresis, pitting edema of the lower extremities, abdominal pain, nausea, emesis, diarrhea, constipation, melena or hematochezia.  No dysuria, frequency or hematuria.  Denies skin rashes or pruritus.  No heat or cold intolerance.  No polyuria, polydipsia or blurry vision.  The patient developed left arm weakness while in the ER, but by his neurological symptoms are currently improving.  ED Course: Initial vital signs temperature 97.7 F, pulse 94, respirations 15, blood pressure 191/112 and O2 sat 94% on room air.  His urinalysis showed glucosuria of 50 mg/dL and small hemoglobinuria.  Urine toxicology was negative.  CBC showed platelet level of 124, but was otherwise normal.  PT 13.5 and APTT 42 seconds.  INR was 1.04.  His CMP showed a glucose of 202 mg/dL.  His sodium was 133 and chloride 98 mmol/L, but normalized once corrected to hyperglycemia.  Renal function and LFTs are within normal limits.  CT head and CTA head and neck did not show acute stroke.  However, EEG shows significant atherosclerotic changes.  Please see images and full radiology report for further detail.  Review of Systems: As per HPI otherwise  10 point review of systems negative.   Past Medical History:  Diagnosis Date  . AAA (abdominal aortic aneurysm) ([place])   . CAD (coronary artery disease)    CABG '97, multiple PCIs  . COPD (chronic obstructive pulmonary disease) ([place])   . Diabetes mellitus    diet controlled  . Dyslipidemia   . Hypertension   . Myocardial infarction [facility]) 1997   stents x2 (2003/2007)    Past Surgical History:  Procedure Laterality Date  . CORONARY ANGIOPLASTY  2003/2007   2 stents  . CORONARY ANGIOPLASTY WITH STENT PLACEMENT  08/14/14   DES to LIMA-LAD insertion  . CORONARY ARTERY BYPASS GRAFT  1997   7 vessels  . CORONARY STENT PLACEMENT  08/15/2014   MID LAD  DES  by Dr Burt Knack  . CYSTECTOMY  2005   top of head-[place]  . EAR CYST EXCISION  07/09/2012   Procedure: CYST REMOVAL;  Surgeon: Jamesetta So, MD;  Location: [facility];  Service: General;  Laterality: N/A;  . LEFT HEART CATHETERIZATION WITH CORONARY/GRAFT ANGIOGRAM N/A 08/14/2014   Procedure: LEFT HEART CATHETERIZATION WITH Beatrix Fetters;  Surgeon: Blane Ohara, MD;  Location: [facility] C[facility] LAB;  Service: Cardiovascular;  Laterality: N/A;     reports that he quit smoking about 17 years ago. His smoking use included cigarettes. He has a 120.00 pack-year smoking history. He has never used smokeless tobacco. He reports that he does not drink alcohol or use drugs.  Allergies  Allergen Reactions  . Dye Fdc Red [Red Dye]  hives  . Ivp Dye [Iodinated Diagnostic Agents] Hives  . Keflex [Cephalexin]     hoarsness  . Plavix [Clopidogrel] Hives    Unsure if dye or Plavix    Family History  Problem Relation Age of Onset  . Other Father        deceased after ?TCS or barium enema, age 50s  . Hypertension Father   . Heart attack Mother   . Colon cancer Neg Hx   . Liver disease Neg Hx     Prior to Admission medications   Medication Sig Start Date End Date Taking? Authorizing Provider  atenolol (TENORMIN) 100 MG tablet Take 1  tablet (100 mg total) by mouth daily. 06/27/17   Kathyrn Drown, MD  chlorzoxazone (PARAFON) 500 MG tablet Take 1 tablet (500 mg total) by mouth 3 (three) times daily as needed for muscle spasms. 12/28/16   Kathyrn Drown, MD  HYDROcodone-acetaminophen [facility]) 10-325 MG tablet 1 bid prn pain 04/09/18   Kathyrn Drown, MD  ibuprofen (ADVIL,MOTRIN) 200 MG tablet Take 600 mg by mouth daily as needed (pain). Reported on 03/02/2016    [provider]  ketoconazole (NIZORAL) 2 % cream Apply 1 application topically 2 (two) times daily. APPLY BID TO RASH IN GROIN AREA 03/06/18   Kathyrn Drown, MD  lisinopril (PRINIVIL,ZESTRIL) 10 MG tablet TAKE ONE TABLET BY MOUTH ONCE DAILY 11/09/17   Kathyrn Drown, MD  nitroGLYCERIN (NITROSTAT) 0.4 MG SL tablet Place 1 tablet (0.4 mg total) under the tongue every 5 (five) minutes as needed for chest pain. 07/31/14   Rolland Porter, MD  traMADol Veatrice Bourbon) 50 MG tablet 1 bid prn 04/09/18   Kathyrn Drown, MD    Physical Exam: Vitals:   04/12/18 0445 04/12/18 0449 04/12/18 0500 04/12/18 0514  BP: (!) 168/100  (!) 153/106   Pulse: 85  88   Resp:    16  Temp:  98 F (36.7 C)    TempSrc:      SpO2: 96%  95%   Weight:      Height:        Constitutional: NAD, calm, comfortable Eyes: PERRL, lids and conjunctivae normal ENMT: Mucous membranes are moist. Posterior pharynx clear of any exudate or lesions. Neck: normal, supple, no masses, no thyromegaly Respiratory: clear to auscultation bilaterally, no wheezing, no crackles. Normal respiratory effort. No accessory muscle use.  Cardiovascular: Regular rate and rhythm, no murmurs / rubs / gallops. No extremity edema. 2+ pedal pulses. No carotid bruits.  Abdomen: no tenderness, no masses palpated. No hepatosplenomegaly. Bowel sounds positive.  Musculoskeletal: no clubbing / cyanosis. Good ROM, no contractures. Normal muscle tone.  Skin: no rashes, lesions, ulcers on limited dermatological exam. Neurologic: CN 2-12  grossly intact, except for mild left facial weakness. Sensation is mildly decreased on the left, DTR normal.  4/5 left-sided weakness. Psychiatric: Normal judgment and insight. Alert and oriented x 4. Normal mood.    Labs on Admission: I have personally reviewed following labs and imaging studies  CBC: Recent Labs  Lab 04/12/18 0234 04/12/18 0246  WBC 7.2  --   NEUTROABS 3.2  --   HGB 14.8 14.6  HCT 41.9 43.0  MCV 92.1  --   PLT 124*  --    Basic Metabolic Panel: Recent Labs  Lab 04/12/18 0234 04/12/18 0246  NA 133* 135  K 4.1 4.1  CL 98* 99*  CO2 25  --   GLUCOSE 202* 201*  BUN 15 14  CREATININE 1.09 1.10  CALCIUM 9.6  --    GFR: Estimated Creatinine Clearance: 62.7 mL/min (by C-G formula based on SCr of 1.1 mg/dL). Liver Function Tests: Recent Labs  Lab 04/12/18 0234  AST 31  ALT 39  ALKPHOS 79  BILITOT 0.8  PROT 7.8  ALBUMIN 4.1   No results for input(s): LIPASE, AMYLASE in the last 168 hours. No results for input(s): AMMONIA in the last 168 hours. Coagulation Profile: Recent Labs  Lab 04/12/18 0234  INR 1.04   Cardiac Enzymes: Recent Labs  Lab 04/12/18 0234  TROPONINI 0.06*   BNP (last 3 results) No results for input(s): PROBNP in the last 8760 hours. HbA1C: No results for input(s): HGBA1C in the last 72 hours. CBG: No results for input(s): GLUCAP in the last 168 hours. Lipid Profile: No results for input(s): CHOL, HDL, LDLCALC, TRIG, CHOLHDL, LDLDIRECT in the last 72 hours. Thyroid Function Tests: No results for input(s): TSH, T4TOTAL, FREET4, T3FREE, THYROIDAB in the last 72 hours. Anemia Panel: No results for input(s): VITAMINB12, FOLATE, FERRITIN, TIBC, IRON, RETICCTPCT in the last 72 hours. Urine analysis:    Component Value Date/Time   COLORURINE STRAW (A) 04/12/2018 0320   APPEARANCEUR CLEAR 04/12/2018 0320   LABSPEC 1.012 04/12/2018 0320   PHURINE 7.0 04/12/2018 0320   GLUCOSEU 50 (A) 04/12/2018 0320   HGBUR SMALL (A)  04/12/2018 0320   BILIRUBINUR NEGATIVE 04/12/2018 0320   KETONESUR NEGATIVE 04/12/2018 0320   PROTEINUR NEGATIVE 04/12/2018 0320   NITRITE NEGATIVE 04/12/2018 0320   LEUKOCYTESUR NEGATIVE 04/12/2018 0320    Radiological Exams on Admission: Ct Angio Head W Or Wo Contrast  Result Date: 04/12/2018 CLINICAL DATA:  Initial evaluation for acute left-sided weakness. EXAM: CT ANGIOGRAPHY HEAD AND NECK TECHNIQUE: Multidetector CT imaging of the head and neck was performed using the standard protocol during bolus administration of intravenous contrast. Multiplanar CT image reconstructions and MIPs were obtained to evaluate the vascular anatomy. Carotid stenosis measurements (when applicable) are obtained utilizing NASCET criteria, using the distal internal carotid diameter as the denominator. CONTRAST:  146mL ISOVUE-370 IOPAMIDOL (ISOVUE-370) INJECTION 76% COMPARISON:  Prior head CT from earlier same day. FINDINGS: CTA NECK FINDINGS Aortic arch: Visualized arch of normal caliber with normal branch pattern. Atherosclerotic change about the arch and origin of the great vessels without significant stenosis. Visualized subclavian arteries patent. Right carotid system: Right common carotid artery patent to the bifurcation without stenosis. Mixed plaque about the proximal right ICA with associated narrowing of up to 40% by NASCET criteria. Right ICA patent distally to the skull base. Left carotid system: Left common carotid artery patent to the bifurcation without significant stenosis. Atheromatous plaque about the proximal left ICA with associated mild narrowing of no more than 30% by NASCET criteria. Left ICA widely patent distally to the skull base. Vertebral arteries: Both of the vertebral arteries arise from the subclavian arteries. Atheromatous plaque at the origin of the vertebral arteries without hemodynamically significant stenosis. Vertebral arteries otherwise widely patent within the neck without stenosis,  dissection, or occlusion. Skeleton: No acute osseous abnormality. 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 No worrisome lytic or blastic osseous lesions. Median sternotomy wires noted. Other neck: No acute soft tissue abnormality within the neck. Salivary and thyroid glands are normal. No adenopathy. Upper chest: Visualized upper mediastinum within normal limits. Emphysema noted. Superimposed diffuse ground-glass opacity with interlobular septal thickening, most suggestive of edema. Review of the MIP images confirms the above findings CTA HEAD FINDINGS Anterior  circulation: Petrous segments widely patent. Scattered atheromatous plaque within the cavernous/supraclinoid ICAs. Mild-to-moderate stenosis at the para clinoid segments bilaterally. ICA termini widely patent. A1 segments, anterior communicating artery, and anterior cerebral arteries widely patent. M1 segments patent without stenosis. No proximal M2 occlusion. Distal MCA branches well perfused and symmetric. Posterior circulation: Right vertebral artery patent to the vertebrobasilar junction without stenosis. Atheromatous plaque at the left V4 segment with mild to moderate stenosis. Posterior inferior cerebral arteries patent bilaterally. Basilar widely patent to its distal aspect. Superior cerebellar arteries patent bilaterally. Tandem moderate to severe mid-distal left P2 stenoses. PCAs otherwise widely patent. Venous sinuses: Patent. Anatomic variants: None significant. Delayed phase: No abnormal enhancement. Review of the MIP images confirms the above findings IMPRESSION: 1. Negative CTA for emergent large vessel occlusion. 2. Atheromatous plaque about the proximal ICAs with relatively mild stenosis of up to 30-40% by NASCET criteria, right slightly worse than left. 3. Carotid siphon atherosclerotic change with resultant mild to moderate stenoses at the para clinoid segments. 4. Tandem moderate to severe mid-distal left P2  stenoses. 5. Emphysema with findings suggestive of superimposed pulmonary edema. Electronically Signed   By: Jeannine Boga M.D.   On: 04/12/2018 04:06   Ct Angio Neck W And/or Wo Contrast  Result Date: 04/12/2018 CLINICAL DATA:  Initial evaluation for acute left-sided weakness. EXAM: CT ANGIOGRAPHY HEAD AND NECK TECHNIQUE: Multidetector CT imaging of the head and neck was performed using the standard protocol during bolus administration of intravenous contrast. Multiplanar CT image reconstructions and MIPs were obtained to evaluate the vascular anatomy. Carotid stenosis measurements (when applicable) are obtained utilizing NASCET criteria, using the distal internal carotid diameter as the denominator. CONTRAST:  1100mL ISOVUE-370 IOPAMIDOL (ISOVUE-370) INJECTION 76% COMPARISON:  Prior head CT from earlier same day. FINDINGS: CTA NECK FINDINGS Aortic arch: Visualized arch of normal caliber with normal branch pattern. Atherosclerotic change about the arch and origin of the great vessels without significant stenosis. Visualized subclavian arteries patent. Right carotid system: Right common carotid artery patent to the bifurcation without stenosis. Mixed plaque about the proximal right ICA with associated narrowing of up to 40% by NASCET criteria. Right ICA patent distally to the skull base. Left carotid system: Left common carotid artery patent to the bifurcation without significant stenosis. Atheromatous plaque about the proximal left ICA with associated mild narrowing of no more than 30% by NASCET criteria. Left ICA widely patent distally to the skull base. Vertebral arteries: Both of the vertebral arteries arise from the subclavian arteries. Atheromatous plaque at the origin of the vertebral arteries without hemodynamically significant stenosis. Vertebral arteries otherwise widely patent within the neck without stenosis, dissection, or occlusion. Skeleton: No acute osseous abnormality. Degenerative  spondylolysis at C5-6 with resultant mild spinal stenosis. No worrisome lytic or blastic osseous lesions. Median sternotomy wires noted. Other neck: No acute soft tissue abnormality within the neck. Salivary and thyroid glands are normal. No adenopathy. Upper chest: Visualized upper mediastinum within normal limits. Emphysema noted. Superimposed diffuse ground-glass opacity with interlobular septal thickening, most suggestive of edema. Review of the MIP images confirms the above findings CTA HEAD FINDINGS Anterior circulation: Petrous segments widely patent. Scattered atheromatous plaque within the cavernous/supraclinoid ICAs. Mild-to-moderate stenosis at the para clinoid segments bilaterally. ICA termini widely patent. A1 segments, anterior communicating artery, and anterior cerebral arteries widely patent. M1 segments patent without stenosis. No proximal M2 occlusion. Distal MCA branches well perfused and symmetric. Posterior circulation: Right vertebral artery patent to the vertebrobasilar junction without stenosis. Atheromatous plaque  at the left V4 segment with mild to moderate stenosis. Posterior inferior cerebral arteries patent bilaterally. Basilar widely patent to its distal aspect. Superior cerebellar arteries patent bilaterally. Tandem moderate to severe mid-distal left P2 stenoses. PCAs otherwise widely patent. Venous sinuses: Patent. Anatomic variants: None significant. Delayed phase: No abnormal enhancement. Review of the MIP images confirms the above findings IMPRESSION: 1. Negative CTA for emergent large vessel occlusion. 2. Atheromatous plaque about the proximal ICAs with relatively mild stenosis of up to 30-40% by NASCET criteria, right slightly worse than left. 3. Carotid siphon atherosclerotic change with resultant mild to moderate stenoses at the para clinoid segments. 4. Tandem moderate to severe mid-distal left P2 stenoses. 5. Emphysema with findings suggestive of superimposed pulmonary edema.  Electronically Signed   By: Jeannine Boga M.D.   On: 04/12/2018 04:06   Ct Head Code Stroke Wo Contrast  Result Date: 04/12/2018 CLINICAL DATA:  Code stroke. Initial evaluation for acute left-sided weakness. EXAM: CT HEAD WITHOUT CONTRAST TECHNIQUE: Contiguous axial images were obtained from the base of the skull through the vertex without intravenous contrast. COMPARISON:  Prior MRI from 07/31/2014. FINDINGS: Brain: Age-related cerebral atrophy with mild chronic small vessel ischemic change. No acute intracranial hemorrhage. No acute large vessel territory infarct. No mass lesion, midline shift or mass effect. No hydrocephalus. No extra-axial fluid collection. Vascular: No asymmetric hyperdense vessel. Calcified atherosclerotic change at the skull base. Skull: Scalp soft tissues within normal limits.  Calvarium intact. Sinuses/Orbits: Globes and orbital soft tissues within normal limits. Paranasal sinuses and mastoid air cells are clear. Other: None. ASPECTS [facility] Stroke Program Early CT Score) - Ganglionic level infarction (caudate, lentiform nuclei, internal capsule, insula, M1-M3 cortex): 7 - Supraganglionic infarction (M4-M6 cortex): 3 Total score (0-10 with 10 being normal): 10 IMPRESSION: 1. No acute intracranial infarct or other abnormality identified. 2. ASPECTS is 10. 3. Mild age-related cerebral atrophy with chronic small vessel ischemic disease. Critical Value/emergent results were called by telephone at the time of interpretation on 04/12/2018 at 2:40 am to Dr. Ripley Fraise , who verbally acknowledged these results. Electronically Signed   By: Jeannine Boga M.D.   On: 04/12/2018 02:41    EKG: Independently reviewed.  Vent. rate 92 BPM PR interval * ms QRS duration 109 ms QT/QTc 363/449 ms P-R-T axes 77 -25 83 Sinus rhythm Ventricular premature complex Probable left ventricular hypertrophy No significant change when compared to previous EKG.  Assessment/Plan Principal  Problem:   Stroke-like symptoms Observation/telemetry. Frequent neuro checks. Supplemental oxygen as needed. Check hemoglobin A1c and fasting lipids. Check carotid Doppler. Check echocardiogram. Check MRI of brain. PT/OT evaluation. Consult neurology.  Active Problems:   Dyslipidemia Not on the statin. Heart healthy diet.    Type II diabetes mellitus, well controlled ([place]) Carbohydrate modified diet. CBG monitoring with regular insulin sliding scale in the hospital.    HTN (hypertension), benign Hold antihypertensives for now. Allow permissive hypertension up to 220/110 mmHg. The rationale for permissive hypertension was explained to the patient. Monitor blood pressure closely.    COPD (chronic obstructive pulmonary disease) ([facility])  Asymptomatic at this time. Supplemental oxygen and bronchodilators as needed.    CAD (coronary artery disease) Resume atenolol Resume aspirin, see this week's discussion with Dr. Wolfgang Phoenix.    Elevated troponin Likely due to demand ischemia secondary to stress. Trend troponin level. Check echocardiogram.    Claudication Check arterial Doppler lower extremities.    DVT prophylaxis: Lovenox SQ. Code Status: Full code. Family Communication: His wife has dementia  and he is her primary caregiver. Disposition Plan: Observation for stroke work-up. Consults called: Routine neurology consult. Admission status: Inpatient/telemetry.   Reubin Milan MD [facility] Pager [phone].  If 7PM-7AM, please contact night-coverage [email or web address] Password [email or web address]  04/12/2018, 5:15 AM

## 2018-04-12 NOTE — Evaluation (Signed)
Physical Therapy Evaluation Patient Details Name: Peter [name] MRN: [id number] DOB: 06-20-1941 Today's Date: 04/12/2018   History of Present Illness  Peter [name] is a 77 y.o. 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  No blurred vision, no dizziness, no slurred speech, no headache.  He denies fever, chills, cough, sore throat, chest pain, palpitations, diaphoresis, pitting edema of the lower extremities, abdominal pain, nausea, emesis, diarrhea, constipation, melena or hematochezia.  No dysuria, frequency or hematuria.  Denies skin rashes or pruritus.  No heat or cold intolerance.  No polyuria, polydipsia or blurry vision.  The patient developed left arm weakness while in the ER, but by his neurological symptoms are currently improving    Clinical Impression  Patient functioning well below baseline (see below), has difficulty sitting up in bed, transfers and ambulation due to left sided weakness, required use of RW to complete sit to stands, demonstrates LLE incoordination/weakness when taking steps, transferred to chair, but requested to go back to bed secondary to c/o fatigue.  Patient will benefit from continued physical therapy in hospital and recommended venue below to increase strength, balance, endurance for safe ADLs and gait.    Follow Up Recommendations SNF    Equipment Recommendations  Rolling walker with 5" wheels    Recommendations for Other Services       Precautions / Restrictions Precautions Precautions: Fall Restrictions Weight Bearing Restrictions: No      Mobility  Bed Mobility Overal bed mobility: Needs Assistance Bed Mobility: Supine to Sit;Sit to Supine      Supine to sit: Mod assist Sit to supine: Min assist   General bed mobility comments: slow labored movement  Transfers Overall transfer level: Needs assistance Equipment used: Rolling walker (2 wheeled);None Transfers: Sit to/from [facility] Sit to Stand: Mod assist;Min assist Stand pivot transfers: Mod assist;Min assist       General transfer comment: mod/max without use of assistive device for sit to stands  Ambulation/Gait Ambulation/Gait assistance: Mod assist Ambulation Distance (Feet): 10 Feet Assistive device: Rolling walker (2 wheeled) Gait Pattern/deviations: Decreased step length - left;Decreased stance time - left;Decreased stride length Gait velocity: slow   General Gait Details: slow labored cadence with diffiuclty advancing LLE due to weakness, fair/poor dynamic standing balance, limited mostly due to c/o fatigue  Stairs            Wheelchair Mobility    Modified Rankin (Stroke Patients Only)       Balance Overall balance assessment: Needs assistance Sitting-balance support: Feet supported;No upper extremity supported Sitting balance-Leahy Scale: Fair     Standing balance support: No upper extremity supported;During functional activity Standing balance-Leahy Scale: Poor Standing balance comment: fair using RW                             Pertinent Vitals/Pain Pain Assessment: 0-10 Pain Score: 9  Pain Location: BLE, left greater than right Pain Descriptors / Indicators: Cramping Pain Intervention(s): Limited activity within patient's tolerance;Monitored during session    Home Living Family/patient expects to be discharged to:: Private residence Living Arrangements: Spouse/significant other(care giver for his spouse) Available Help at Discharge: Family Type of Home: Mobile home  Home Access: Ramped entrance     Home Layout: One level Home Equipment: Bedside commode;Shower seat      Prior Function Level of  Independence: Independent         Comments: community ambulator, drives     Hand Dominance   Dominant Hand: Right    Extremity/Trunk Assessment   Upper Extremity Assessment Upper Extremity Assessment: Defer to OT evaluation    Lower Extremity Assessment Lower Extremity Assessment: Generalized weakness;RLE deficits/detail;LLE deficits/detail RLE Deficits / Details: grossly 5/5 LLE Deficits / Details: grossly 3+/5    Cervical / Trunk Assessment Cervical / Trunk Assessment: Normal  Communication   Communication: No difficulties  Cognition Arousal/Alertness: Awake/alert Behavior During Therapy: WFL for tasks assessed/performed Overall Cognitive Status: Within Functional Limits for tasks assessed                                        General Comments      Exercises     Assessment/Plan    PT Assessment Patient needs continued PT services  PT Problem List Decreased strength;Decreased activity tolerance;Decreased balance;Decreased mobility       PT Treatment Interventions Gait training;Stair training;Functional mobility training;Therapeutic activities;Therapeutic exercise;Patient/family education;Neuromuscular re-education    PT Goals (Current goals can be found in the Care Plan section)  Acute Rehab PT Goals Patient Stated Goal: return home after rehab PT Goal Formulation: With patient/family Time For Goal Achievement: 04/26/18 Potential to Achieve Goals: Good    Frequency 7X/week   Barriers to discharge        Co-evaluation               AM-PAC PT "6 Clicks" Daily Activity  Outcome Measure Difficulty turning over in bed (including adjusting bedclothes, sheets and blankets)?: A Little Difficulty moving from lying on back to sitting on the side of the bed? : A Lot Difficulty sitting down on and standing up from a chair with arms (e.g., wheelchair, bedside commode, etc,.)?: A Lot Help needed moving to and from a bed to chair  (including a wheelchair)?: A Lot Help needed walking in hospital room?: A Lot Help needed climbing 3-5 steps with a railing? : A Lot 6 Click Score: 13    End of Session Equipment Utilized During Treatment: Gait belt Activity Tolerance: Patient limited by fatigue;Patient tolerated treatment well;Patient limited by pain Patient left: in bed;with call bell/phone within reach;with bed alarm set;with family/visitor present Nurse Communication: Mobility status PT Visit Diagnosis: Unsteadiness on feet (R26.81);Other abnormalities of gait and mobility (R26.89);Muscle weakness (generalized) (M62.81)    Time: 2841-3244 PT Time Calculation (min) (ACUTE ONLY): 22 min   Charges:   PT Evaluation $PT Eval Moderate Complexity: 1 Mod PT Treatments $Therapeutic Activity: 8-22 mins   PT G Codes:        11:56 AM, 05/06/18 Lonell Grandchild, MPT Physical Therapist with [facility] [phone] office [phone] mobile phone

## 2018-04-12 NOTE — Evaluation (Addendum)
Speech Language Pathology Evaluation Patient Details Name: Peter [name] MRN: [id number] DOB: 1941-05-01 Today's Date: 04/12/2018 Time: 9323-5573 SLP Time Calculation (min) (ACUTE ONLY): 31 min  Problem List:  Patient Active Problem List   Diagnosis Date Noted  . Stroke-like symptoms 04/12/2018  . CAD (coronary artery disease) 04/12/2018  . Elevated troponin 04/12/2018  . Claudication ([place]) 04/12/2018  . 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  PCI- LIMA-LAD insertion site DES 08/14/14 08/15/2014  . COPD (chronic obstructive pulmonary disease) ([place]) 08/15/2014  . 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 Plavix allergy 08/15/2014  . Exertional angina ([place]) 08/14/2014  . HTN (hypertension), benign 02/03/2014  . AAA (abdominal aortic aneurysm) without rupture ([place]) 02/03/2014  . H/O ETOH abuse 04/02/2013  . Bowel habit changes 04/02/2013  . Dyslipidemia 03/13/2013  . Type II diabetes mellitus, well controlled ([place]) 03/13/2013  . Pain, abdominal, nonspecific 03/13/2013   Past Medical History:  Past Medical History:  Diagnosis Date  . AAA (abdominal aortic aneurysm) ([place])   . CAD (coronary artery disease)    CABG '97, multiple PCIs  . COPD (chronic obstructive pulmonary disease) ([place])   . Diabetes mellitus    diet controlled  . Dyslipidemia   . Hypertension   . Myocardial infarction [facility]) 1997   stents x2 (2003/2007)   Past Surgical History:  Past Surgical History:  Procedure Laterality Date  . CORONARY ANGIOPLASTY  2003/2007   2 stents  . CORONARY ANGIOPLASTY WITH STENT PLACEMENT  08/14/14   DES to LIMA-LAD insertion  . CORONARY ARTERY BYPASS GRAFT  1997   7 vessels  . CORONARY STENT PLACEMENT  08/15/2014   MID LAD  DES  by Dr Burt Knack  . CYSTECTOMY  2005   top of head-[place]  . EAR CYST EXCISION  07/09/2012   Procedure: CYST REMOVAL;  Surgeon: Jamesetta So, MD;  Location: [facility];  Service: General;  Laterality: N/A;  . LEFT HEART CATHETERIZATION WITH CORONARY/GRAFT  ANGIOGRAM N/A 08/14/2014   Procedure: LEFT HEART CATHETERIZATION WITH Beatrix Fetters;  Surgeon: Blane Ohara, MD;  Location: [facility] CATH LAB;  Service: Cardiovascular;  Laterality: N/A;   HPI:  77 y.o. 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  No blurred vision, no dizziness, no slurred speech, no headache.   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  Assessment / Plan / Recommendation Clinical Impression   Pt administered portions of M[facility] Cognitive Assessment) with results indicating pt is functioning WNL as attention, language, orientation, delayed recall and naming were all [facility]; visuospatial skills was not assessed as pt did not have glasses available for reading; he was able to read environmental signs without apparent difficulty; writing assessment not completed as well; pt stated his only complaint is leg weakness at current time and may "need therapy for increasing his strength." No recommendations for speech therapy beyond this evaluation.  Thank you for this consult.    SLP Assessment  SLP Recommendation/Assessment: Patient does not need any further Speech Language Pathology Services SLP Visit Diagnosis: Other (comment)    Follow Up Recommendations  None  Frequency and Duration   n/a        SLP Evaluation Cognition  Overall Cognitive Status: Within Functional Limits for tasks assessed Arousal/Alertness: Awake/alert Orientation Level: Oriented X4 Memory: Appears intact Awareness: Appears intact Problem Solving: Appears  intact Safety/Judgment: Appears intact       Comprehension  Auditory Comprehension Overall Auditory Comprehension: Appears within functional limits for tasks assessed Visual Recognition/Discrimination Discrimination: Not tested Reading Comprehension Reading Status: Unable to assess (comment)(No glasses; difficulty completing visuospatial tasks)    Expression Expression Primary Mode of Expression: Verbal Verbal Expression Overall Verbal Expression: Appears within functional limits for tasks assessed Level of Generative/Spontaneous Verbalization: Conversation Repetition: No impairment Naming: No impairment Non-Verbal Means of Communication: Not applicable Written Expression Written Expression: Not tested   Oral / Motor  Oral Motor/Sensory Function Overall Oral Motor/Sensory Function: Within functional limits Motor Speech Overall Motor Speech: Appears within functional limits for tasks assessed Respiration: Within functional limits Phonation: Normal Resonance: Within functional limits Articulation: Within functional limitis Intelligibility: Intelligible                       Elvina Sidle, M.S., CCC-SLP 04/12/2018, 5:12 PM

## 2018-04-12 NOTE — Progress Notes (Signed)
*  PRELIMINARY RESULTS* Echocardiogram 2D Echocardiogram has been performed.  Peter [name] 04/12/2018, 12:20 PM

## 2018-04-12 NOTE — Plan of Care (Signed)
  Problem: Acute Rehab PT Goals(only PT should resolve) Goal: Pt Will Go Supine/Side To Sit Outcome: Progressing Flowsheets (Taken 04/12/2018 1157) Pt will go Supine/Side to Sit: with min guard assist Goal: Patient Will Transfer Sit To/From Stand Outcome: Progressing Flowsheets (Taken 04/12/2018 1157) Patient will transfer sit to/from stand: with minimal assist Goal: Pt Will Transfer Bed To Chair/Chair To Bed Outcome: Progressing Flowsheets (Taken 04/12/2018 1157) Pt will Transfer Bed to Chair/Chair to Bed: with min assist Goal: Pt Will Ambulate Outcome: Progressing Flowsheets (Taken 04/12/2018 1157) Pt will Ambulate: with minimal assist;50 feet;with rolling walker  11:58 AM, 04/12/18 Lonell Grandchild, MPT Physical Therapist with [facility] [phone] office [phone] mobile phone

## 2018-04-12 NOTE — Progress Notes (Signed)
OT Cancellation Note  Patient Details Name: Peter [name] MRN: [id number] DOB: 11-04-1941   Cancelled Treatment:    Reason Eval/Treat Not Completed: Patient at procedure or test/ unavailable. Attempted to see pt this am, pt off floor for testing. Will attempt evaluation at a later time.   Guadelupe Sabin, OTR/L  [phone] 04/12/2018, 9:21 AM

## 2018-04-12 NOTE — Care Management Obs Status (Signed)
[place] NOTIFICATION   Patient Details  Name: Peter [name] MRN: [id number] Date of Birth: February 07, 1941   Medicare Observation Status Notification Given:  Yes    Justene Jensen, Chauncey Reading, RN 04/12/2018, 11:01 AM

## 2018-04-12 NOTE — Consult Note (Addendum)
Peter A. Merlene Laughter, MD     [email or web address]          Peter [name] is an 77 y.o. male.   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 The patient has been noncompliant with antiplatelet agents for known reasons. I recommend full dose of aspirin 325 along with other risk factor reduction such as statin use, diabetes and blood pressure control.   Elevated troponin levels and reduced ejection fraction: He is being seen and management by cardiology. Clear from our standpoint for anticoagulation.     The patient is a 77 year old white male who presents with acute onset of weakness involving the left lower extremity and left upper extremity. He reports gait instability with this event. He seemed to be slightly dysarthric and indeed his family reports that he spaces slightly slurred although the patient seemed not to recognize this. He has had some nausea although no vomiting with this event. Does not report chest pain or shortness of breath. The right-sided symptomatic. No headaches are reported. No dizziness or diplopia. Complaints that he's had some episodes of severe cramping involving the feet and calves recently. The review systems is otherwise negative.   GENERAL: This is very pleasant and jovial gentleman who is in no acute distress.  HEENT:  Normal  ABDOMEN: soft  EXTREMITIES: No edema   BACK: Normal  SKIN: Normal by inspection.    MENTAL STATUS: Alert and oriented. Speech - slightly dysarthric; language and cognition are generally intact. Judgment and insight normal.   CRANIAL NERVES: Pupils are L 5 mm (cataract is present) and R 4 mm round and reactive to light and accomodation; extra ocular movements are full, there is no significant nystagmus; visual fields are full; upper and lower  facial muscles are normal in strength and symmetric, there is no flattening of the nasolabial folds; tongue is midline; uvula is midline; shoulder elevation is normal.  MOTOR: Normal tone, bulk and strength; no pronator drift. There is no drift of the upper lower extremities.  COORDINATION: Left finger to nose is normal, right finger to nose is normal, No rest tremor; no intention tremor; no postural tremor; no bradykinesia.  REFLEXES: Deep tendon reflexes are symmetrical and normal. Plantar reflexes are equivocal bilaterally.   SENSATION: Normal to light touch, temperature, and pain. There is no extinction on double simultaneous stimulation.     NIH stroke scale 1.  Blood pressure (!) 144/84, pulse 84, temperature 98.6 F (37 C), temperature source Oral, resp. rate 16, height 6' (1.829 m), weight 193 lb (87.5 kg), SpO2 98 %.  Past Medical History:  Diagnosis Date  . AAA (abdominal aortic aneurysm) ([place])   . CAD (coronary artery disease)    CABG '97, multiple PCIs  . COPD (chronic obstructive pulmonary disease) ([place])   . Diabetes mellitus    diet controlled  . Dyslipidemia   . Hypertension   . Myocardial infarction [facility]) 1997   stents x2 (2003/2007)    Past Surgical History:  Procedure Laterality Date  . CORONARY ANGIOPLASTY  2003/2007   2 stents  . CORONARY ANGIOPLASTY WITH STENT PLACEMENT  08/14/14   DES to LIMA-LAD insertion  . CORONARY ARTERY BYPASS GRAFT  1997   7 vessels  . CORONARY STENT PLACEMENT  08/15/2014   MID LAD  DES  by Dr Burt Knack  . CYSTECTOMY  2005  top of head-[place]  . EAR CYST EXCISION  07/09/2012   Procedure: CYST REMOVAL;  Surgeon: Jamesetta So, MD;  Location: [facility];  Service: General;  Laterality: N/A;  . LEFT HEART CATHETERIZATION WITH CORONARY/GRAFT ANGIOGRAM N/A 08/14/2014   Procedure: LEFT HEART CATHETERIZATION WITH Beatrix Fetters;  Surgeon: Blane Ohara, MD;  Location: [facility] CATH LAB;  Service: Cardiovascular;  Laterality: N/A;     Family History  Problem Relation Age of Onset  . Other Father        deceased after ?TCS or barium enema, age 4s  . Hypertension Father   . Heart attack Mother   . Colon cancer Neg Hx   . Liver disease Neg Hx     Social History:  reports that he quit smoking about 17 years ago. His smoking use included cigarettes. He has a 120.00 pack-year smoking history. He has never used smokeless tobacco. He reports that he does not drink alcohol or use drugs.  Allergies:  Allergies  Allergen Reactions  . Dye Fdc Red [Red Dye]     hives  . Ivp Dye [Iodinated Diagnostic Agents] Hives  . Keflex [Cephalexin]     hoarsness  . Plavix [Clopidogrel] Hives    Unsure if dye or Plavix    Medications: Prior to Admission medications   Medication Sig Start Date End Date Taking? Authorizing Provider  atenolol (TENORMIN) 100 MG tablet Take 1 tablet (100 mg total) by mouth daily. 06/27/17   Kathyrn Drown, MD  chlorzoxazone (PARAFON) 500 MG tablet Take 1 tablet (500 mg total) by mouth 3 (three) times daily as needed for muscle spasms. 12/28/16   Kathyrn Drown, MD  HYDROcodone-acetaminophen [facility]) 10-325 MG tablet 1 bid prn pain 04/09/18   Kathyrn Drown, MD  ibuprofen (ADVIL,MOTRIN) 200 MG tablet Take 600 mg by mouth daily as needed (pain). Reported on 03/02/2016    [provider]  ketoconazole (NIZORAL) 2 % cream Apply 1 application topically 2 (two) times daily. APPLY BID TO RASH IN GROIN AREA 03/06/18   Kathyrn Drown, MD  lisinopril (PRINIVIL,ZESTRIL) 10 MG tablet TAKE ONE TABLET BY MOUTH ONCE DAILY 11/09/17   Kathyrn Drown, MD  nitroGLYCERIN (NITROSTAT) 0.4 MG SL tablet Place 1 tablet (0.4 mg total) under the tongue every 5 (five) minutes as needed for chest pain. 07/31/14   Rolland Porter, MD  traMADol Veatrice Bourbon) 50 MG tablet 1 bid prn 04/09/18   Kathyrn Drown, MD    Scheduled Meds: .  stroke: mapping our early stages of recovery book   Does not apply Once  . [START ON 04/13/2018] aspirin EC   81 mg Oral Daily  . enoxaparin (LOVENOX) injection  40 mg Subcutaneous Q24H  . insulin aspart  0-15 Units Subcutaneous TID WC   Continuous Infusions: . sodium chloride 75 mL/hr at 04/12/18 1529   PRN Meds:.acetaminophen **OR** acetaminophen (TYLENOL) oral liquid 160 mg/5 mL **OR** acetaminophen, ondansetron **OR** ondansetron (ZOFRAN) IV     Results for orders placed or performed during the hospital encounter of 04/12/18 (from the past 48 hour(s))  Ethanol     Status: None   Collection Time: 04/12/18  2:34 AM  Result Value Ref Range   Alcohol, Ethyl (B) <10 <10 mg/dL    Comment:        LOWEST DETECTABLE LIMIT FOR SERUM ALCOHOL IS 10 mg/dL FOR MEDICAL PURPOSES ONLY Performed at [facility], [address], [place], [place] [address]   Protime-INR  Status: None   Collection Time: 04/12/18  2:34 AM  Result Value Ref Range   Prothrombin Time 13.5 11.4 - 15.2 seconds   INR 1.04     Comment: Performed at [facility], [address], [place], [place] [address]  APTT     Status: Abnormal   Collection Time: 04/12/18  2:34 AM  Result Value Ref Range   aPTT 42 (H) 24 - 36 seconds    Comment:        IF BASELINE aPTT IS ELEVATED, SUGGEST PATIENT RISK ASSESSMENT BE USED TO DETERMINE APPROPRIATE ANTICOAGULANT THERAPY. Performed at [facility], [address] [place], [place] [address]   CBC     Status: Abnormal   Collection Time: 04/12/18  2:34 AM  Result Value Ref Range   WBC 7.2 4.0 - 10.5 K/uL   RBC 4.55 4.22 - 5.81 MIL/uL   Hemoglobin 14.8 13.0 - 17.0 g/dL   HCT 41.9 39.0 - 52.0 %   MCV 92.1 78.0 - 100.0 fL   MCH 32.5 26.0 - 34.0 pg   MCHC 35.3 30.0 - 36.0 g/dL   RDW 12.7 11.5 - 15.5 %   Platelets 124 (L) 150 - 400 K/uL    Comment: Performed at [facility], [address], [place], [place] [address]  Differential     Status: None   Collection Time: 04/12/18  2:34 AM  Result Value Ref Range   Neutrophils Relative % 44 %   Neutro Abs 3.2 1.7 - 7.7 K/uL   Lymphocytes  Relative 42 %   Lymphs Abs 3.1 0.7 - 4.0 K/uL   Monocytes Relative 10 %   Monocytes Absolute 0.7 0.1 - 1.0 K/uL   Eosinophils Relative 4 %   Eosinophils Absolute 0.3 0.0 - 0.7 K/uL   Basophils Relative 0 %   Basophils Absolute 0.0 0.0 - 0.1 K/uL    Comment: Performed at [facility], [address], [place], [place] [address]  Comprehensive metabolic panel     Status: Abnormal   Collection Time: 04/12/18  2:34 AM  Result Value Ref Range   Sodium 133 (L) 135 - 145 mmol/L   Potassium 4.1 3.5 - 5.1 mmol/L   Chloride 98 (L) 101 - 111 mmol/L   CO2 25 22 - 32 mmol/L   Glucose, Bld 202 (H) 65 - 99 mg/dL   BUN 15 6 - 20 mg/dL   Creatinine, Ser 1.09 0.61 - 1.24 mg/dL   Calcium 9.6 8.9 - 10.3 mg/dL   Total Protein 7.8 6.5 - 8.1 g/dL   Albumin 4.1 3.5 - 5.0 g/dL   AST 31 15 - 41 U/L   ALT 39 17 - 63 U/L   Alkaline Phosphatase 79 38 - 126 U/L   Total Bilirubin 0.8 0.3 - 1.2 mg/dL   GFR calc non Af Amer >60 >60 mL/min   GFR calc Af Amer >60 >60 mL/min    Comment: (NOTE) The eGFR has been calculated using the CKD EPI equation. This calculation has not been validated in all clinical situations. eGFR's persistently <60 mL/min signify possible Chronic Kidney Disease.    Anion gap 10 5 - 15    Comment: Performed at [facility], [address] [place] [address], [place], [place] [address]  Troponin I     Status: Abnormal   Collection Time: 04/12/18  2:34 AM  Result Value Ref Range   Troponin I 0.06 (HH) <0.03 ng/mL    Comment: CRITICAL RESULT CALLED TO, READ BACK BY AND VERIFIED WITH: NORMAN,B@[id number]   BY MATTHEWS, B5.9.19 Performed at [facility], [address] [place], [place] [address]   Phosphorus     Status: None   Collection Time: 04/12/18  2:34 AM  Result Value Ref Range   Phosphorus 3.1 2.5 - 4.6 mg/dL    Comment: Performed at [facility], [address] [place], [place] [address]  Magnesium     Status: None   Collection Time: 04/12/18  2:34 AM  Result Value Ref Range   Magnesium 1.8 1.7 - 2.4  mg/dL    Comment: Performed at [facility], [address], [place], [place] [address]  I-Stat Chem 8, ED     Status: Abnormal   Collection Time: 04/12/18  2:46 AM  Result Value Ref Range   Sodium 135 135 - 145 mmol/L   Potassium 4.1 3.5 - 5.1 mmol/L   Chloride 99 (L) 101 - 111 mmol/L   BUN 14 6 - 20 mg/dL   Creatinine, Ser 1.10 0.61 - 1.24 mg/dL   Glucose, Bld 201 (H) 65 - 99 mg/dL   Calcium, Ion 1.19 1.15 - 1.40 mmol/L   TCO2 24 22 - 32 mmol/L   Hemoglobin 14.6 13.0 - 17.0 g/dL   HCT 43.0 39.0 - 52.0 %  Urine rapid drug screen (hosp performed)     Status: None   Collection Time: 04/12/18  3:20 AM  Result Value Ref Range   Opiates NONE DETECTED NONE DETECTED   Cocaine NONE DETECTED NONE DETECTED   Benzodiazepines NONE DETECTED NONE DETECTED   Amphetamines NONE DETECTED NONE DETECTED   Tetrahydrocannabinol NONE DETECTED NONE DETECTED   Barbiturates NONE DETECTED NONE DETECTED    Comment: (NOTE) DRUG SCREEN FOR MEDICAL PURPOSES ONLY.  IF CONFIRMATION IS NEEDED FOR ANY PURPOSE, NOTIFY LAB WITHIN 5 DAYS. LOWEST DETECTABLE LIMITS FOR URINE DRUG SCREEN Drug Class                     Cutoff (ng/mL) Amphetamine and metabolites    1000 Barbiturate and metabolites    200 Benzodiazepine                 370 Tricyclics and metabolites     300 Opiates and metabolites        300 Cocaine and metabolites        300 THC                            50 Performed at [facility], [address], [place], [place] [address]   Urinalysis, Routine w reflex microscopic     Status: Abnormal   Collection Time: 04/12/18  3:20 AM  Result Value Ref Range   Color, Urine STRAW (A) YELLOW   APPearance CLEAR CLEAR   Specific Gravity, Urine 1.012 1.005 - 1.030   pH 7.0 5.0 - 8.0   Glucose, UA 50 (A) NEGATIVE mg/dL   Hgb urine dipstick SMALL (A) NEGATIVE   Bilirubin Urine NEGATIVE NEGATIVE   Ketones, ur NEGATIVE NEGATIVE mg/dL   Protein, ur NEGATIVE NEGATIVE mg/dL   Nitrite NEGATIVE NEGATIVE    Leukocytes, UA NEGATIVE NEGATIVE   RBC / HPF 0-5 0 - 5 RBC/hpf   WBC, UA 0-5 0 - 5 WBC/hpf   Bacteria, UA NONE SEEN NONE SEEN    Comment: Performed at [facility], [address] [place], [place] [address]  Glucose, capillary     Status: Abnormal   Collection Time: 04/12/18 10:01 AM  Result  Value Ref Range   Glucose-Capillary 194 (H) 65 - 99 mg/dL   Comment 1 Notify RN   Troponin I     Status: Abnormal   Collection Time: 04/12/18 10:06 AM  Result Value Ref Range   Troponin I 0.87 (HH) <0.03 ng/mL    Comment: CRITICAL RESULT CALLED TO, READ BACK BY AND VERIFIED WITH: FOLEY,T AT 10:55AM ON 04/12/18 BY [facility] Performed at [facility], [address] [place], [place] [address]   Glucose, capillary     Status: Abnormal   Collection Time: 04/12/18 11:32 AM  Result Value Ref Range   Glucose-Capillary 200 (H) 65 - 99 mg/dL   Comment 1 Notify RN   Troponin I     Status: Abnormal   Collection Time: 04/12/18  2:05 PM  Result Value Ref Range   Troponin I 0.86 (HH) <0.03 ng/mL    Comment: CRITICAL VALUE NOTED.  VALUE IS CONSISTENT WITH PREVIOUSLY REPORTED AND CALLED VALUE. Performed at [facility], [address], [place], [place] [address]   Glucose, capillary     Status: Abnormal   Collection Time: 04/12/18  4:16 PM  Result Value Ref Range   Glucose-Capillary 185 (H) 65 - 99 mg/dL    Studies/Results:  BRAIN MRI MRA FINDINGS: MRI HEAD FINDINGS  Brain: Restricted diffusion in the right corona radiata region the caudate body measuring 12 mm.  Mild cerebral volume loss. Mild chronic small vessel ischemia in the cerebral white matter.  No hemorrhage, hydrocephalus, or collection.  Vascular: Arterial findings below. Preserved dural venous sinus flow voids.  Skull and upper cervical spine: No evidence of marrow lesion.  Sinuses/Orbits: Right cataract resection.  MRA HEAD FINDINGS  Symmetric carotid and vertebral arteries. Incomplete circle-of-Willis by MRA. No  branch occlusion or proximal flow limiting stenosis. Atheromatous irregularity of medium size vessels, most notably a moderate left P2 segment stenosis. Negative for aneurysm.  IMPRESSION: Brain MRI:  1. 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 2. Mild chronic small vessel ischemia in the cerebral white matter.  Intracranial MRA:  No acute finding.  Stable from CTA earlier today.      TTE Study Conclusions  - Left ventricle: The cavity size was normal. Wall thickness was   increased in a pattern of moderate LVH. Systolic function was   moderately to severely reduced. The estimated ejection fraction   was in the range of 30% to 35%. Features are consistent with a   pseudonormal left ventricular filling pattern, with concomitant   abnormal relaxation and increased filling pressure (grade 2   diastolic dysfunction). Doppler parameters are consistent with   high ventricular filling pressure. - Regional wall motion abnormality: Akinesis of the mid-apical   anterior, basal-mid anteroseptal, mid inferoseptal, apical   septal, and apical myocardium. - Aortic valve: Mildly calcified annulus. Trileaflet; mildly   thickened leaflets. Valve area (VTI): 2.94 cm^2. Valve area   (Vmax): 3.12 cm^2. - Mitral valve: Mildly calcified annulus. Normal thickness leaflets   . - Left atrium: The atrium was mildly dilated.   CAROTID DOPPLERS IMPRESSION: 1. Bilateral carotid bifurcation and proximal ICA plaque, right greater than left, resulting in less than 50% diameter stenosis. 2.  Antegrade bilateral vertebral arterial flow.    HEAD NECK CTA IMPRESSION: 1. Negative CTA for emergent large vessel occlusion. 2. Atheromatous plaque about the proximal ICAs with relatively mild stenosis of up to 30-40% by NASCET criteria, right slightly worse than left. 3. Carotid siphon atherosclerotic change with resultant mild  to moderate stenoses at the para clinoid  segments. 4. Tandem moderate to severe mid-distal left P2 stenoses. 5. 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     THE BRAIN mri IS REVIEWED IN PERSON. There is acute infarct on DWI with a increase involving the right centrum semiovale and extended to the posterior limb of internal capsule. This associated with reduced signal on the ADC scan. No hemorrhages appreciated.  There is mild periventricular white matter chronic ischemic changes and moderate deep gray matter lesions. MRA shows luminal irregularities involving the PCA bilaterally.      Tal Neer A. Merlene [name], M.D.  Diplomate, Tax adviser of Psychiatry and Neurology ( Neurology). 04/12/2018, 6:21 PM

## 2018-04-12 NOTE — ED Provider Notes (Signed)
[facility] EMERGENCY DEPARTMENT Provider Note   CSN: [id number] Arrival date & time: 04/12/18  2595   An emergency department physician performed an initial assessment on this suspected stroke patient at 0226.  History   Chief Complaint Chief Complaint  Patient presents with  . Code Stroke  Level 5 caveat due to acuity of condition  HPI Peter [name] is a 77 y.o. male.  The history is provided by the patient and the EMS personnel.  Extremity Weakness  This is a new problem. The current episode started 3 to 5 hours ago. The problem occurs constantly. The problem has been gradually improving. Pertinent negatives include no chest pain, no abdominal pain, no headaches and no shortness of breath. Nothing aggravates the symptoms. Nothing relieves the symptoms.   Patient reports abrupt onset of left arm and left leg weakness at approximately midnight.  He has never had this before.  He denies any other complaints.  He denies headache/chest pain/shortness of breath.  No recent falls.  No back or neck pain He denies history of stroke Past Medical History:  Diagnosis Date  . AAA (abdominal aortic aneurysm) ([place])   . CAD (coronary artery disease)    CABG '97, multiple PCIs  . COPD (chronic obstructive pulmonary disease) ([place])   . Diabetes mellitus    diet controlled  . Dyslipidemia   . Hypertension   . Myocardial infarction [facility]) 1997   stents x2 (2003/2007)    Patient Active Problem List   Diagnosis Date Noted  . 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  PCI- LIMA-LAD insertion site DES 08/14/14 08/15/2014  . COPD (chronic obstructive pulmonary disease) ([place]) 08/15/2014  . 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 Plavix allergy 08/15/2014  . Exertional angina ([place]) 08/14/2014  . HTN (hypertension), benign 02/03/2014  . AAA (abdominal aortic aneurysm) without rupture ([place]) 02/03/2014  . H/O ETOH abuse 04/02/2013  . Bowel habit changes 04/02/2013  . Dyslipidemia 03/13/2013  .  Type II diabetes mellitus, well controlled ([place]) 03/13/2013  . Pain, abdominal, nonspecific 03/13/2013    Past Surgical History:  Procedure Laterality Date  . CORONARY ANGIOPLASTY  2003/2007   2 stents  . CORONARY ANGIOPLASTY WITH STENT PLACEMENT  08/14/14   DES to LIMA-LAD insertion  . CORONARY ARTERY BYPASS GRAFT  1997   7 vessels  . CORONARY STENT PLACEMENT  08/15/2014   MID LAD  DES  by Dr Burt Knack  . CYSTECTOMY  2005   top of head-[place]  . EAR CYST EXCISION  07/09/2012   Procedure: CYST REMOVAL;  Surgeon: Jamesetta So, MD;  Location: [facility];  Service: General;  Laterality: N/A;  . LEFT HEART CATHETERIZATION WITH CORONARY/GRAFT ANGIOGRAM N/A 08/14/2014   Procedure: LEFT HEART CATHETERIZATION WITH Beatrix Fetters;  Surgeon: Blane Ohara, MD;  Location: [facility] C[facility] LAB;  Service: Cardiovascular;  Laterality: N/A;        Home Medications    Prior to Admission medications   Medication Sig Start Date End Date Taking? Authorizing Provider  atenolol (TENORMIN) 100 MG tablet Take 1 tablet (100 mg total) by mouth daily. 06/27/17   Kathyrn Drown, MD  chlorzoxazone (PARAFON) 500 MG tablet Take 1 tablet (500 mg total) by mouth 3 (three) times daily as needed for muscle spasms. 12/28/16   Kathyrn Drown, MD  HYDROcodone-acetaminophen [facility]) 10-325 MG tablet 1 bid prn pain 04/09/18   Kathyrn Drown, MD  ibuprofen (ADVIL,MOTRIN) 200 MG tablet Take 600 mg by mouth daily  as needed (pain). Reported on 03/02/2016    [provider]  ketoconazole (NIZORAL) 2 % cream Apply 1 application topically 2 (two) times daily. APPLY BID TO RASH IN GROIN AREA 03/06/18   Kathyrn Drown, MD  lisinopril (PRINIVIL,ZESTRIL) 10 MG tablet TAKE ONE TABLET BY MOUTH ONCE DAILY 11/09/17   Kathyrn Drown, MD  nitroGLYCERIN (NITROSTAT) 0.4 MG SL tablet Place 1 tablet (0.4 mg total) under the tongue every 5 (five) minutes as needed for chest pain. 07/31/14   Rolland Porter, MD  traMADol Veatrice Bourbon) 50 MG tablet 1  bid prn 04/09/18   Kathyrn Drown, MD    Family History Family History  Problem Relation Age of Onset  . Other Father        deceased after ?TCS or barium enema, age 77s  . Hypertension Father   . Heart attack Mother   . Colon cancer Neg Hx   . Liver disease Neg Hx     Social History Social History   Tobacco Use  . Smoking status: Former Smoker    Packs/day: 3.00    Years: 40.00    Pack years: 120.00    Types: Cigarettes    Last attempt to quit: 07/02/2000    Years since quitting: 17.7  . Smokeless tobacco: Never Used  Substance Use Topics  . Alcohol use: No    Alcohol/week: 6.0 oz    Types: 10 Cans of beer per week    Comment: quit 3 months (01/2013). 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  . Drug use: No     Allergies   Dye fdc red [red dye]; Ivp dye [iodinated diagnostic agents]; Keflex [cephalexin]; and Plavix [clopidogrel]   Review of Systems Review of Systems  Unable to perform ROS: Acuity of condition  Respiratory: Negative for shortness of breath.   Cardiovascular: Negative for chest pain.  Gastrointestinal: Negative for abdominal pain.  Musculoskeletal: Positive for extremity weakness.  Neurological: Negative for headaches.     Physical Exam Updated Vital Signs BP (!) 166/104 (BP Location: Left Arm)   Pulse 83   Temp 97.7 F (36.5 C) (Oral)   Resp 17   Ht 1.829 m (6')   Wt 87.5 kg (193 lb)   SpO2 95%   BMI 26.18 kg/m   Physical Exam CONSTITUTIONAL: Elderly HEAD: Normocephalic/atraumatic EYES: EOMI/PERRL, no nystagmus, no visual field deficit  no ptosis ENMT: Mucous membranes moist NECK: supple no meningeal signs CV: S1/S2 noted, no murmurs/rubs/gallops noted LUNGS: Lungs are clear to auscultation bilaterally, no apparent distress ABDOMEN: soft, nontender, no rebound or guarding NEURO:Awake/alert, face symmetric, Left arm drift noted.  Left leg drift noted Leg ataxia noted Reports sensory deficit to left  face/left arm/left leg No dysarthria, no aphasia NIHSS=5 EXTREMITIES: pulses normalx4, full ROM SKIN: warm, color normal PSYCH: no abnormalities of mood noted   ED Treatments / Results  Labs (all labs ordered are listed, but only abnormal results are displayed) Labs Reviewed  APTT - Abnormal; Notable for the following components:      Result Value   aPTT 42 (*)    All other components within normal limits  CBC - Abnormal; Notable for the following components:   Platelets 124 (*)    All other components within normal limits  COMPREHENSIVE METABOLIC PANEL - Abnormal; Notable for the following components:   Sodium 133 (*)    Chloride 98 (*)    Glucose, Bld 202 (*)    All other components  within normal limits  URINALYSIS, ROUTINE W REFLEX MICROSCOPIC - Abnormal; Notable for the following components:   Color, Urine STRAW (*)    Glucose, UA 50 (*)    Hgb urine dipstick SMALL (*)    All other components within normal limits  TROPONIN I - Abnormal; Notable for the following components:   Troponin I 0.06 (*)    All other components within normal limits  I-STAT CHEM 8, ED - Abnormal; Notable for the following components:   Chloride 99 (*)    Glucose, Bld 201 (*)    All other components within normal limits  ETHANOL  PROTIME-INR  DIFFERENTIAL  RAPID URINE DRUG SCREEN, HOSP PERFORMED  PHOSPHORUS  MAGNESIUM    EKG EKG Interpretation  Date/Time:  Thursday Apr 12 2018 02:37:39 EDT Ventricular Rate:  92 PR Interval:    QRS Duration: 109 QT Interval:  363 QTC Calculation: 449 R Axis:   -25 Text Interpretation:  Sinus rhythm Ventricular premature complex Probable left ventricular hypertrophy No significant change since last tracing Confirmed by Ripley Fraise [phone]) on 04/12/2018 3:19:04 AM   Radiology Ct Angio Head W Or Wo Contrast  Result Date: 04/12/2018 CLINICAL DATA:  Initial evaluation for acute left-sided weakness. EXAM: CT ANGIOGRAPHY HEAD AND NECK TECHNIQUE:  Multidetector CT imaging of the head and neck was performed using the standard protocol during bolus administration of intravenous contrast. Multiplanar CT image reconstructions and MIPs were obtained to evaluate the vascular anatomy. Carotid stenosis measurements (when applicable) are obtained utilizing NASCET criteria, using the distal internal carotid diameter as the denominator. CONTRAST:  148mL ISOVUE-370 IOPAMIDOL (ISOVUE-370) INJECTION 76% COMPARISON:  Prior head CT from earlier same day. FINDINGS: CTA NECK FINDINGS Aortic arch: Visualized arch of normal caliber with normal branch pattern. Atherosclerotic change about the arch and origin of the great vessels without significant stenosis. Visualized subclavian arteries patent. Right carotid system: Right common carotid artery patent to the bifurcation without stenosis. Mixed plaque about the proximal right ICA with associated narrowing of up to 40% by NASCET criteria. Right ICA patent distally to the skull base. Left carotid system: Left common carotid artery patent to the bifurcation without significant stenosis. Atheromatous plaque about the proximal left ICA with associated mild narrowing of no more than 30% by NASCET criteria. Left ICA widely patent distally to the skull base. Vertebral arteries: Both of the vertebral arteries arise from the subclavian arteries. Atheromatous plaque at the origin of the vertebral arteries without hemodynamically significant stenosis. Vertebral arteries otherwise widely patent within the neck without stenosis, dissection, or occlusion. Skeleton: No acute osseous abnormality. 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 No worrisome lytic or blastic osseous lesions. Median sternotomy wires noted. Other neck: No acute soft tissue abnormality within the neck. Salivary and thyroid glands are normal. No adenopathy. Upper chest: Visualized upper mediastinum within normal limits. Emphysema noted.  Superimposed diffuse ground-glass opacity with interlobular septal thickening, most suggestive of edema. Review of the MIP images confirms the above findings CTA HEAD FINDINGS Anterior circulation: Petrous segments widely patent. Scattered atheromatous plaque within the cavernous/supraclinoid ICAs. Mild-to-moderate stenosis at the para clinoid segments bilaterally. ICA termini widely patent. A1 segments, anterior communicating artery, and anterior cerebral arteries widely patent. M1 segments patent without stenosis. No proximal M2 occlusion. Distal MCA branches well perfused and symmetric. Posterior circulation: Right vertebral artery patent to the vertebrobasilar junction without stenosis. Atheromatous plaque at the left V4 segment with mild to moderate stenosis. Posterior inferior cerebral arteries patent bilaterally. Basilar widely  patent to its distal aspect. Superior cerebellar arteries patent bilaterally. Tandem moderate to severe mid-distal left P2 stenoses. PCAs otherwise widely patent. Venous sinuses: Patent. Anatomic variants: None significant. Delayed phase: No abnormal enhancement. Review of the MIP images confirms the above findings IMPRESSION: 1. Negative CTA for emergent large vessel occlusion. 2. Atheromatous plaque about the proximal ICAs with relatively mild stenosis of up to 30-40% by NASCET criteria, right slightly worse than left. 3. Carotid siphon atherosclerotic change with resultant mild to moderate stenoses at the para clinoid segments. 4. Tandem moderate to severe mid-distal left P2 stenoses. 5. Emphysema with findings suggestive of superimposed pulmonary edema. Electronically Signed   By: Jeannine Boga M.D.   On: 04/12/2018 04:06   Ct Angio Neck W And/or Wo Contrast  Result Date: 04/12/2018 CLINICAL DATA:  Initial evaluation for acute left-sided weakness. EXAM: CT ANGIOGRAPHY HEAD AND NECK TECHNIQUE: Multidetector CT imaging of the head and neck was performed using the standard  protocol during bolus administration of intravenous contrast. Multiplanar CT image reconstructions and MIPs were obtained to evaluate the vascular anatomy. Carotid stenosis measurements (when applicable) are obtained utilizing NASCET criteria, using the distal internal carotid diameter as the denominator. CONTRAST:  143mL ISOVUE-370 IOPAMIDOL (ISOVUE-370) INJECTION 76% COMPARISON:  Prior head CT from earlier same day. FINDINGS: CTA NECK FINDINGS Aortic arch: Visualized arch of normal caliber with normal branch pattern. Atherosclerotic change about the arch and origin of the great vessels without significant stenosis. Visualized subclavian arteries patent. Right carotid system: Right common carotid artery patent to the bifurcation without stenosis. Mixed plaque about the proximal right ICA with associated narrowing of up to 40% by NASCET criteria. Right ICA patent distally to the skull base. Left carotid system: Left common carotid artery patent to the bifurcation without significant stenosis. Atheromatous plaque about the proximal left ICA with associated mild narrowing of no more than 30% by NASCET criteria. Left ICA widely patent distally to the skull base. Vertebral arteries: Both of the vertebral arteries arise from the subclavian arteries. Atheromatous plaque at the origin of the vertebral arteries without hemodynamically significant stenosis. Vertebral arteries otherwise widely patent within the neck without stenosis, dissection, or occlusion. Skeleton: No acute osseous abnormality. 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 No worrisome lytic or blastic osseous lesions. Median sternotomy wires noted. Other neck: No acute soft tissue abnormality within the neck. Salivary and thyroid glands are normal. No adenopathy. Upper chest: Visualized upper mediastinum within normal limits. Emphysema noted. Superimposed diffuse ground-glass opacity with interlobular septal thickening, most  suggestive of edema. Review of the MIP images confirms the above findings CTA HEAD FINDINGS Anterior circulation: Petrous segments widely patent. Scattered atheromatous plaque within the cavernous/supraclinoid ICAs. Mild-to-moderate stenosis at the para clinoid segments bilaterally. ICA termini widely patent. A1 segments, anterior communicating artery, and anterior cerebral arteries widely patent. M1 segments patent without stenosis. No proximal M2 occlusion. Distal MCA branches well perfused and symmetric. Posterior circulation: Right vertebral artery patent to the vertebrobasilar junction without stenosis. Atheromatous plaque at the left V4 segment with mild to moderate stenosis. Posterior inferior cerebral arteries patent bilaterally. Basilar widely patent to its distal aspect. Superior cerebellar arteries patent bilaterally. Tandem moderate to severe mid-distal left P2 stenoses. PCAs otherwise widely patent. Venous sinuses: Patent. Anatomic variants: None significant. Delayed phase: No abnormal enhancement. Review of the MIP images confirms the above findings IMPRESSION: 1. Negative CTA for emergent large vessel occlusion. 2. Atheromatous plaque about the proximal ICAs with relatively mild stenosis of up  to 30-40% by NASCET criteria, right slightly worse than left. 3. Carotid siphon atherosclerotic change with resultant mild to moderate stenoses at the para clinoid segments. 4. Tandem moderate to severe mid-distal left P2 stenoses. 5. Emphysema with findings suggestive of superimposed pulmonary edema. Electronically Signed   By: Jeannine Boga M.D.   On: 04/12/2018 04:06   Ct Head Code Stroke Wo Contrast  Result Date: 04/12/2018 CLINICAL DATA:  Code stroke. Initial evaluation for acute left-sided weakness. EXAM: CT HEAD WITHOUT CONTRAST TECHNIQUE: Contiguous axial images were obtained from the base of the skull through the vertex without intravenous contrast. COMPARISON:  Prior MRI from 07/31/2014.  FINDINGS: Brain: Age-related cerebral atrophy with mild chronic small vessel ischemic change. No acute intracranial hemorrhage. No acute large vessel territory infarct. No mass lesion, midline shift or mass effect. No hydrocephalus. No extra-axial fluid collection. Vascular: No asymmetric hyperdense vessel. Calcified atherosclerotic change at the skull base. Skull: Scalp soft tissues within normal limits.  Calvarium intact. Sinuses/Orbits: Globes and orbital soft tissues within normal limits. Paranasal sinuses and mastoid air cells are clear. Other: None. ASPECTS [facility] Stroke Program Early CT Score) - Ganglionic level infarction (caudate, lentiform nuclei, internal capsule, insula, M1-M3 cortex): 7 - Supraganglionic infarction (M4-M6 cortex): 3 Total score (0-10 with 10 being normal): 10 IMPRESSION: 1. No acute intracranial infarct or other abnormality identified. 2. ASPECTS is 10. 3. Mild age-related cerebral atrophy with chronic small vessel ischemic disease. Critical Value/emergent results were called by telephone at the time of interpretation on 04/12/2018 at 2:40 am to Dr. Ripley Fraise , who verbally acknowledged these results. Electronically Signed   By: Jeannine Boga M.D.   On: 04/12/2018 02:41    Procedures Procedures (including critical care time)  Medications Ordered in ED Labetalol for HTN  Initial Impression / Assessment and Plan / ED Course  I have reviewed the triage vital signs and the nursing notes.  Pertinent labs & imaging results that were available during my care of the patient were reviewed by me and considered in my medical decision making (see chart for details).     Patient  seen on arrival for code stroke.  After CT head was done I did a full assessment His initial NIH stroke scale was 5.  Initial last known well was reported at midnight However after further discussion with patient, he now admits that his last known well was at 2200 on may eighth CT head read  as negative at 0240am Seen by dr Deirdre Evener teleneuro [phone] tPA in stroke considered but not given due to: Onset over 3-4.5hours Tele-neurologist recommended CT Angio of head and neck.  Patient has reported IV contrast allergy, but he had he now states this was a delayed rash after several days Has had CT scan since then.  We will proceed with CT angios 5:13 AM CTA did not reveal any large vessel occlusion.  Patient actually looks improved.  The weakness in his left arm and leg appear to be improving.  He is awake alert. Discussed with Dr. Olevia Bowens for admission  Final Clinical Impressions(s) / ED Diagnoses   Final diagnoses:  Stroke-like symptom    ED Discharge Orders    None       Ripley Fraise, MD 04/12/18 [phone]

## 2018-04-12 NOTE — Progress Notes (Signed)
Code stroke  Call TIme  [id number]   767 Exam started  220 Exam finished  [place]   230 Radiologist  230

## 2018-04-12 NOTE — NC FL2 (Signed)
[place] MEDICAID FL2 LEVEL OF CARE SCREENING TOOL     IDENTIFICATION  Patient Name: MAREON ROBINETTE Birthdate: [birth year] Sex: male Admission Date (Current Location): 04/12/2018  [facility] and [place] Number:  [employer] and Address:  [place] [address] [place]      Provider Number: [id number]  Attending Physician Name and Address:  Rodena Goldmann, DO  Relative Name and Phone Number:  Kevork Joyce [phone]    Current Level of Care: Hospital Recommended Level of Care: [place] Prior Approval Number:    Date Approved/Denied:   PASRR Number: [id number]  Discharge Plan: SNF    Current Diagnoses: Patient Active Problem List   Diagnosis Date Noted  . Stroke-like symptoms 04/12/2018  . CAD (coronary artery disease) 04/12/2018  . Elevated troponin 04/12/2018  . Claudication ([place]) 04/12/2018  . 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  PCI- LIMA-LAD insertion site DES 08/14/14 08/15/2014  . COPD (chronic obstructive pulmonary disease) ([place]) 08/15/2014  . 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 Plavix allergy 08/15/2014  . Exertional angina ([place]) 08/14/2014  . HTN (hypertension), benign 02/03/2014  . AAA (abdominal aortic aneurysm) without rupture ([place]) 02/03/2014  . H/O ETOH abuse 04/02/2013  . Bowel habit changes 04/02/2013  . Dyslipidemia 03/13/2013  . Type II diabetes mellitus, well controlled ([place]) 03/13/2013  . Pain, abdominal, nonspecific 03/13/2013    Orientation RESPIRATION BLADDER Height & Weight     Self, Time, Situation, Place    Continent Weight: 193 lb (87.5 kg) Height:  6' (182.9 cm)  BEHAVIORAL SYMPTOMS/MOOD NEUROLOGICAL BOWEL NUTRITION STATUS      Continent Diet(see dc summary)  AMBULATORY STATUS COMMUNICATION OF NEEDS Skin   Extensive Assist Verbally Normal                       Personal Care Assistance Level of Assistance  Bathing, Feeding, Dressing Bathing Assistance:  Limited assistance Feeding assistance: Independent Dressing Assistance: Limited assistance     Functional Limitations Info  Sight, Hearing, Speech Sight Info: Adequate Hearing Info: Adequate Speech Info: Adequate    SPECIAL CARE FACTORS FREQUENCY  PT (By licensed PT)     PT Frequency: 5 times week              Contractures Contractures Info: Not present    Additional Factors Info  Code Status, Allergies Code Status Info: full Allergies Info: dye Fdc red, IVP dye, Keflex, Plavix           Current Medications (04/12/2018):  This is the current hospital active medication list Current Facility-Administered Medications  Medication Dose Route Frequency Provider Last Rate Last Dose  .  stroke: mapping our early stages of recovery book   Does not apply Once Reubin Milan, MD      . acetaminophen (TYLENOL) tablet 650 mg  650 mg Oral Q4H PRN Reubin Milan, MD       Or  . acetaminophen (TYLENOL) solution 650 mg  650 mg Per Tube Q4H PRN Reubin Milan, MD       Or  . acetaminophen (TYLENOL) suppository 650 mg  650 mg Rectal Q4H PRN Reubin Milan, MD      . Derrill Memo ON 04/13/2018] aspirin EC tablet 81 mg  81 mg Oral Daily Reubin Milan, MD      . enoxaparin (LOVENOX) injection 40 mg  40 mg Subcutaneous Q24H Reubin Milan, MD   40  mg at 04/12/18 0657  . insulin aspart (novoLOG) injection 0-15 Units  0-15 Units Subcutaneous TID WC Reubin Milan, MD   3 Units at 04/12/18 1149  . ondansetron (ZOFRAN) tablet 4 mg  4 mg Oral Q6H PRN Reubin Milan, MD       Or  . ondansetron [facility]) injection 4 mg  4 mg Intravenous Q6H PRN Reubin Milan, MD   4 mg at 04/12/18 1025     Discharge Medications: Please see discharge summary for a list of discharge medications.  Relevant Imaging Results:  Relevant Lab Results:   Additional Information SSN: [id number] [place]

## 2018-04-12 NOTE — Clinical Social Work Note (Signed)
Clinical Social Work Assessment  Patient Details  Name: Peter [name] MRN: [id number] Date of Birth: 10/03/1941  Date of referral:  04/12/18               Reason for consult:  Facility Placement, Discharge Planning                Permission sought to share information with:  Facility Art therapist granted to share information::     Name::        Agency::  [facility]  Relationship::     Contact Information:     Housing/Transportation Living arrangements for the past 2 months:  Single Family Home Source of Information:  Patient Patient Interpreter Needed:  None Criminal Activity/Legal Involvement Pertinent to Current Situation/Hospitalization:  No - Comment as needed Significant Relationships:  Adult Children, Spouse Lives with:  Spouse Do you feel safe going back to the place where you live?  Yes Need for family participation in patient care:  No (Coment)  Care giving concerns: PT recommending SNF STR.    Social Worker assessment / plan: Pt is a 77 year old male referred to CSW for STR needs status post CVA. Met with pt at bedside today. Pt's son, who is a Adult nurse, arrived in the room at the later point of LCSW visit. Pt is very pleasant and easily engages in conversation. Pt informs LCSW that he lives with his wife [place] address. 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 He states that family members are providing care at this time. Pt aware that PT recommending SNF. He is agreeable and would like referral to [facility]. Pt expresses that he is anxious to see the neurologist and hear what he has to say.  Pt appears sad and he became tearful taking about his wife and not being there for her. Active listening and emotional support provided.    Will initiate referral and follow for support and dc planning as needed.  Employment status:  Retired Nurse, adult PT Recommendations:   [place] / Referral to community resources:     Patient/Family's Response to care: Pt accepting of care.  Patient/Family's Understanding of and Emotional Response to Diagnosis, Current Treatment, and Prognosis: Pt and family appear to have a good understanding of diagnosis and treatment recommendations. Pt appears sad and is tearful related to his wife and emotional support was offered.   Emotional Assessment Appearance:  Appears stated age Attitude/Demeanor/Rapport:  Engaged Affect (typically observed):  Calm, Pleasant, Sad Orientation:  Oriented to Self, Oriented to Place, Oriented to  Time, Oriented to Situation Alcohol / Substance use:  Not Applicable Psych involvement (Current and /or in the community):  No (Comment)  Discharge Needs  Concerns to be addressed:  Discharge Planning Concerns Readmission within the last 30 days:  No Current discharge risk:  Physical Impairment Barriers to Discharge:  No Barriers Identified   Shade Flood, LCSW 04/12/2018, 12:31 PM

## 2018-04-13 DIAGNOSIS — R2990 Unspecified symptoms and signs involving the nervous system: Secondary | ICD-10-CM | POA: Diagnosis not present

## 2018-04-13 LAB — CBC
HCT: 42.2 % (ref 39.0–52.0)
Hemoglobin: 14.5 g/dL (ref 13.0–17.0)
MCH: 32 pg (ref 26.0–34.0)
MCHC: 34.4 g/dL (ref 30.0–36.0)
MCV: 93.2 fL (ref 78.0–100.0)
Platelets: 124 10*3/uL — ABNORMAL LOW (ref 150–400)
RBC: 4.53 MIL/uL (ref 4.22–5.81)
RDW: 12.9 % (ref 11.5–15.5)
WBC: 8.3 10*3/uL (ref 4.0–10.5)

## 2018-04-13 LAB — GLUCOSE, CAPILLARY
Glucose-Capillary: 177 mg/dL — ABNORMAL HIGH (ref 65–99)
Glucose-Capillary: 150 mg/dL — ABNORMAL HIGH (ref 65–99)
Glucose-Capillary: 227 mg/dL — ABNORMAL HIGH (ref 65–99)
Glucose-Capillary: 108 mg/dL — ABNORMAL HIGH (ref 65–99)

## 2018-04-13 LAB — LIPID PANEL
Cholesterol: 198 mg/dL (ref 0–200)
HDL: 29 mg/dL — ABNORMAL LOW (ref >40)
LDL Cholesterol: 139 mg/dL — ABNORMAL HIGH (ref 0–99)
Total CHOL/HDL Ratio: 6.8 RATIO
Triglycerides: 152 mg/dL — ABNORMAL HIGH (ref <150)
VLDL: 30 mg/dL (ref 0–40)

## 2018-04-13 LAB — BASIC METABOLIC PANEL
Anion gap: 8 (ref 5–15)
BUN: 12 mg/dL (ref 6–20)
CO2: 25 mmol/L (ref 22–32)
Calcium: 8.7 mg/dL — ABNORMAL LOW (ref 8.9–10.3)
Chloride: 102 mmol/L (ref 101–111)
Creatinine, Ser: 1.05 mg/dL (ref 0.61–1.24)
GFR calc Af Amer: >60 mL/min (ref >60)
GFR calc non Af Amer: >60 mL/min (ref >60)
Glucose, Bld: 135 mg/dL — ABNORMAL HIGH (ref 65–99)
Potassium: 4 mmol/L (ref 3.5–5.1)
Sodium: 135 mmol/L (ref 135–145)

## 2018-04-13 LAB — TROPONIN I: Troponin I: 0.52 ng/mL (ref <0.03)

## 2018-04-13 LAB — HEMOGLOBIN A1C
Hgb A1c MFr Bld: 7.7 % — ABNORMAL HIGH (ref 4.8–5.6)
Mean Plasma Glucose: 174.29 mg/dL

## 2018-04-13 LAB — HEPARIN LEVEL (UNFRACTIONATED): Heparin Unfractionated: 0.4 IU/mL (ref 0.30–0.70)

## 2018-04-13 MED ORDER — ATORVASTATIN CALCIUM 80 MG PO TABS
80.0000 mg | ORAL_TABLET | Freq: Every day | ORAL | Status: DC
  Administered 2018-04-13 – 2018-04-16 (×4): 80 mg via ORAL
  Filled 2018-04-13 (×3): qty 1
  Filled 2018-04-13: qty 2

## 2018-04-13 MED ORDER — HEPARIN (PORCINE) IN NACL 100-0.45 UNIT/ML-% IJ SOLN
1100.0000 [IU]/h | INTRAMUSCULAR | Status: DC
  Administered 2018-04-13 – 2018-04-16 (×5): 1100 [IU]/h via INTRAVENOUS
  Filled 2018-04-13 (×4): qty 250

## 2018-04-13 NOTE — Progress Notes (Signed)
Physical Therapy Treatment Patient Details Name: Peter [name] MRN: [id number] DOB: 07-13-1941 Today's Date: 04/13/2018    History of Present Illness Peter [name] is a 77 y.o. 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  No blurred vision, no dizziness, no slurred speech, no headache.  He denies fever, chills, cough, sore throat, chest pain, palpitations, diaphoresis, pitting edema of the lower extremities, abdominal pain, nausea, emesis, diarrhea, constipation, melena or hematochezia.  No dysuria, frequency or hematuria.  Denies skin rashes or pruritus.  No heat or cold intolerance.  No polyuria, polydipsia or blurry vision.  The patient developed left arm weakness while in the ER, but by his neurological symptoms are currently improving    PT Comments    Patient demonstrates increased endurance/distance for gait training in hallway using RW without loss of balance, unsafe when attempting gait without AD due to fair/poor dynamic standing balance when taking steps and LLE weakness, demonstrates good return for completing BLE exercises and continued sitting up in chair after therapy.  Patient will benefit from continued physical therapy in hospital and recommended venue below to increase strength, balance, endurance for safe ADLs and gait.    Follow Up Recommendations  SNF;Supervision/Assistance - 24 hour     Equipment Recommendations  Rolling walker with 5" wheels    Recommendations for Other Services       Precautions / Restrictions Precautions Precautions: Fall Restrictions Weight Bearing Restrictions: No    Mobility  Bed Mobility               General bed mobility comments:  Presents seated in chair (assisted by OT)  Transfers Overall transfer level: Needs assistance Equipment used: Rolling walker (2 wheeled);None Transfers: Sit to/from [employer] to Stand: Min assist Stand pivot transfers: Supervision       General transfer comment: Supervised using RW, Min asist sit to stand/transfers without AD due to fair/poor standing balance, LLE weakness  Ambulation/Gait Ambulation/Gait assistance: Min guard Ambulation Distance (Feet): 75 Feet Assistive device: Rolling walker (2 wheeled) Gait Pattern/deviations: Decreased step length - right;Decreased step length - left;Decreased stance time - left;Decreased stride length Gait velocity: decreased   General Gait Details: demonstrates increased endurance/distance for gait training with slow labored cadence using RW, very unsafe when attempting gait without AD due to poor balance with difficulty advancing LLE   Stairs             Wheelchair Mobility    Modified Rankin (Stroke Patients Only)       Balance Overall balance assessment: Needs assistance Sitting-balance support: Feet supported;No upper extremity supported Sitting balance-Leahy Scale: Good     Standing balance support: During functional activity;No upper extremity supported Standing balance-Leahy Scale: Poor Standing balance comment: fair/poor without AD, fair using RW                            Cognition Arousal/Alertness: Awake/alert Behavior During Therapy: WFL for tasks assessed/performed Overall Cognitive Status: Within Functional Limits for tasks assessed  Exercises General Exercises - Lower Extremity Long Arc Quad: Seated;AROM;Strengthening;Both;10 reps Hip Flexion/Marching: Seated;AROM;Strengthening;Both;10 reps Toe Raises: Seated;AROM;Strengthening;Both;10 reps Heel Raises: Seated;AROM;Strengthening;Both;10 reps    General Comments         Pertinent Vitals/Pain Pain Assessment: No/denies pain    Home Living                      Prior Function            PT Goals (current goals can now be found in the care plan section) Acute Rehab PT Goals Patient Stated Goal: return home after rehab PT Goal Formulation: With patient/family Time For Goal Achievement: 04/26/18 Potential to Achieve Goals: Good Progress towards PT goals: Progressing toward goals    Frequency    7X/week      PT Plan Current plan remains appropriate    Co-evaluation              AM-PAC PT "6 Clicks" Daily Activity  Outcome Measure  Difficulty turning over in bed (including adjusting bedclothes, sheets and blankets)?: A Little Difficulty moving from lying on back to sitting on the side of the bed? : A Little Difficulty sitting down on and standing up from a chair with arms (e.g., wheelchair, bedside commode, etc,.)?: A Little Help needed moving to and from a bed to chair (including a wheelchair)?: A Little Help needed walking in hospital room?: A Little Help needed climbing 3-5 steps with a railing? : A Lot 6 Click Score: 17    End of Session   Activity Tolerance: Patient tolerated treatment well;Patient limited by fatigue Patient left: in chair;with call bell/phone within reach Nurse Communication: Mobility status PT Visit Diagnosis: Unsteadiness on feet (R26.81);Other abnormalities of gait and mobility (R26.89);Muscle weakness (generalized) (M62.81)     Time: 9166-0600 PT Time Calculation (min) (ACUTE ONLY): 36 min  Charges:  $Gait Training: 8-22 mins $Therapeutic Exercise: 8-22 mins                    G Codes:       3:05 PM, 04-22-2018 Lonell Grandchild, MPT Physical Therapist with [facility] 336 [phone] office [phone] mobile phone

## 2018-04-13 NOTE — Evaluation (Signed)
Occupational Therapy Evaluation Patient Details Name: Peter [name] MRN: [id number] DOB: [birth year] Today's Date: 04/13/2018    History of Present Illness Peter [name] is a 77 y.o. male with medical history significant of AAA, CAD/CABG in 60 with multiple PCI's, history of MI, history of stent placement x2 in 2003 in 2007, COPD, type 2 diabetes, dyslipidemia, hypertension who is coming to the emergency department with complaints of right lower extremity weakness associated with left facial weakness while he was watching TV.  No blurred vision, no dizziness, no slurred speech, no headache.  He denies fever, chills, cough, sore throat, chest pain, palpitations, diaphoresis, pitting edema of the lower extremities, abdominal pain, nausea, emesis, diarrhea, constipation, melena or hematochezia.  No dysuria, frequency or hematuria.  Denies skin rashes or pruritus.  No heat or cold intolerance.  No polyuria, polydipsia or blurry vision.  The patient developed left arm weakness while in the ER, but by his neurological symptoms are currently improving   Clinical Impression   Pt received supine in bed, agreeable to OT evaluation.  Pt demonstrating BUE strength at 5/5, LUE coordination and sensation are intact. Pt reports LLE continues to feel weak but better than yesterday. Pt at supervision level for bed mobility and standing ADLs, min guard during initial sit to stand from EOB. Supervision for functional mobility with verbal cuing for correct RW use. No further OT services required at this time, pt verbalizes understanding of importance of taking his time with tasks and resting when fatigued.    Follow Up Recommendations  No OT follow up    Equipment Recommendations  Tub/shower seat       Precautions / Restrictions Precautions Precautions: Fall Restrictions Weight Bearing Restrictions: No      Mobility Bed Mobility Overal bed mobility: Needs Assistance Bed Mobility: Supine to Sit     Supine  to sit: Supervision        Transfers Overall transfer level: Needs assistance Equipment used: Rolling walker (2 wheeled);None Transfers: Sit to/from [employer] to Stand: Min guard Stand pivot transfers: Supervision       General transfer comment: using RW        ADL either performed or assessed with clinical judgement   ADL Overall ADL's : Needs assistance/impaired     Grooming: Wash/dry hands;Oral care;Supervision/safety;Standing           Upper Body Dressing : Modified independent;Sitting   Lower Body Dressing: Supervision/safety;Sitting/lateral leans               Functional mobility during ADLs: Supervision/safety;Min guard;Rolling walker       Vision Baseline Vision/History: Wears glasses Wears Glasses: Reading only Vision Assessment?: No apparent visual deficits            Pertinent Vitals/Pain Pain Assessment: No/denies pain     Hand Dominance Right   Extremity/Trunk Assessment Upper Extremity Assessment Upper Extremity Assessment: Overall WFL for tasks assessed(grossly 5/5 BUE)   Lower Extremity Assessment Lower Extremity Assessment: Defer to PT evaluation   Cervical / Trunk Assessment Cervical / Trunk Assessment: Normal   Communication Communication Communication: No difficulties   Cognition Arousal/Alertness: Awake/alert Behavior During Therapy: WFL for tasks assessed/performed Overall Cognitive Status: Within Functional Limits for tasks assessed  Home Living Family/patient expects to be discharged to:: Private residence Living Arrangements: Spouse/significant other(pt is caregiver for his wife) Available Help at Discharge: Family;Friend(s);Available PRN/intermittently Type of Home: House Home Access: Ramped entrance     Home Layout: One level     Bathroom Shower/Tub: Teacher, early years/pre: Standard     Home Equipment:  Bedside commode;Shower seat      Lives With: Spouse    Prior Functioning/Environment Level of Independence: Independent        Comments: community ambulator, drives        OT Problem List: Decreased activity tolerance;Impaired balance (sitting and/or standing);Decreased knowledge of use of DME or AE          End of Session Equipment Utilized During Treatment: Gait belt;Rolling walker  Activity Tolerance: Patient tolerated treatment well Patient left: in chair;with call bell/phone within reach  OT Visit Diagnosis: Muscle weakness (generalized) (M62.81)                Time: 8527-7824 OT Time Calculation (min): 35 min Charges:  OT General Charges $OT Visit: 1 Visit OT Evaluation $OT Eval Moderate Complexity: [place], OTR/L  [phone] 04/13/2018, 9:04 AM

## 2018-04-13 NOTE — Clinical Social Work Note (Signed)
LCSW following. Pt referred to [facility] for SNF rehab. Spoke with Tammy at [facility] today. She states that they won't have beds before Monday. Pt needing to go to [facility] for a heart cath. Updated Tammy that pt would not need a bed before Monday and asked her to keep considering pt for a bed offer.   Met with pt and family today to review above. They are aware that CSW here or at [facility] will continue to assist with pt's dc planning needs. Provided pt with resource information for Social [facility] of [facility] and a list of Assisted Living facilities in the area.   CSW team will follow.

## 2018-04-13 NOTE — Progress Notes (Addendum)
Progress Note  Patient Name: Peter [name] Date of Encounter: 04/13/2018  Primary Cardiologist: Harl Bowie  Subjective   No chest pain, no SOB  Inpatient Medications    Scheduled Meds: .  stroke: mapping our early stages of recovery book   Does not apply Once  . aspirin EC  325 mg Oral Daily  . insulin aspart  0-15 Units Subcutaneous TID WC   Continuous Infusions: . heparin     PRN Meds: acetaminophen **OR** acetaminophen (TYLENOL) oral liquid 160 mg/5 mL **OR** acetaminophen, ondansetron **OR** ondansetron (ZOFRAN) IV   Vital Signs    Vitals:   04/13/18 0005 04/13/18 0405 04/13/18 0805 04/13/18 0805  BP: 109/64 124/75  132/79  Pulse: 82 79  78  Resp: 15 17  18   Temp: 98.7 F (37.1 C) 98.6 F (37 C) 98.8 F (37.1 C)   TempSrc: Oral Oral Oral   SpO2: 95% 94%  95%  Weight:      Height:        Intake/Output Summary (Last 24 hours) at 04/13/2018 0824 Last data filed at 04/13/2018 0730 Gross per 24 hour  Intake 252.5 ml  Output 1650 ml  Net -1397.5 ml   Filed Weights   04/12/18 0257  Weight: 193 lb (87.5 kg)    Telemetry    SR - Personally Reviewed  ECG    na  Physical Exam   GEN: No acute distress.   Neck: No JVD Cardiac: RRR, no murmurs, rubs, or gallops.  Respiratory: Clear to auscultation bilaterally. GI: Soft, nontender, non-distended  MS: No edema; No deformity. Neuro:  Nonfocal  Psych: Normal affect   Labs    Chemistry Recent Labs  Lab 04/12/18 0234 04/12/18 0246 04/13/18 0512  NA 133* 135 135  K 4.1 4.1 4.0  CL 98* 99* 102  CO2 25  --  25  GLUCOSE 202* 201* 135*  BUN 15 14 12   CREATININE 1.09 1.10 1.05  CALCIUM 9.6  --  8.7*  PROT 7.8  --   --   ALBUMIN 4.1  --   --   AST 31  --   --   ALT 39  --   --   ALKPHOS 79  --   --   BILITOT 0.8  --   --   GFRNONAA >60  --  >60  GFRAA >60  --  >60  ANIONGAP 10  --  8     Hematology Recent Labs  Lab 04/12/18 0234 04/12/18 0246 04/13/18 0512  WBC 7.2  --  8.3  RBC 4.55   --  4.53  HGB 14.8 14.6 14.5  HCT 41.9 43.0 42.2  MCV 92.1  --  93.2  MCH 32.5  --  32.0  MCHC 35.3  --  34.4  RDW 12.7  --  12.9  PLT 124*  --  124*    Cardiac Enzymes Recent Labs  Lab 04/12/18 0234 04/12/18 1006 04/12/18 1405 04/13/18 0512  TROPONINI 0.06* 0.87* 0.86* 0.52*   No results for input(s): TROPIPOC in the last 168 hours.   BNPNo results for input(s): BNP, PROBNP in the last 168 hours.   DDimer No results for input(s): DDIMER in the last 168 hours.   Radiology    Ct Angio Head W Or Wo Contrast  Result Date: 04/12/2018 CLINICAL DATA:  Initial evaluation for acute left-sided weakness. EXAM: CT ANGIOGRAPHY HEAD AND NECK TECHNIQUE: Multidetector CT imaging of the head and neck was performed using the standard protocol during  bolus administration of intravenous contrast. Multiplanar CT image reconstructions and MIPs were obtained to evaluate the vascular anatomy. Carotid stenosis measurements (when applicable) are obtained utilizing NASCET criteria, using the distal internal carotid diameter as the denominator. CONTRAST:  164mL ISOVUE-370 IOPAMIDOL (ISOVUE-370) INJECTION 76% COMPARISON:  Prior head CT from earlier same day. FINDINGS: CTA NECK FINDINGS Aortic arch: Visualized arch of normal caliber with normal Libia Fazzini pattern. Atherosclerotic change about the arch and origin of the great vessels without significant stenosis. Visualized subclavian arteries patent. Right carotid system: Right common carotid artery patent to the bifurcation without stenosis. Mixed plaque about the proximal right ICA with associated narrowing of up to 40% by NASCET criteria. Right ICA patent distally to the skull base. Left carotid system: Left common carotid artery patent to the bifurcation without significant stenosis. Atheromatous plaque about the proximal left ICA with associated mild narrowing of no more than 30% by NASCET criteria. Left ICA widely patent distally to the skull base. Vertebral  arteries: Both of the vertebral arteries arise from the subclavian arteries. Atheromatous plaque at the origin of the vertebral arteries without hemodynamically significant stenosis. Vertebral arteries otherwise widely patent within the neck without stenosis, dissection, or occlusion. Skeleton: No acute osseous abnormality. 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 No worrisome lytic or blastic osseous lesions. Median sternotomy wires noted. Other neck: No acute soft tissue abnormality within the neck. Salivary and thyroid glands are normal. No adenopathy. Upper chest: Visualized upper mediastinum within normal limits. Emphysema noted. Superimposed diffuse ground-glass opacity with interlobular septal thickening, most suggestive of edema. Review of the MIP images confirms the above findings CTA HEAD FINDINGS Anterior circulation: Petrous segments widely patent. Scattered atheromatous plaque within the cavernous/supraclinoid ICAs. Mild-to-moderate stenosis at the para clinoid segments bilaterally. ICA termini widely patent. A1 segments, anterior communicating artery, and anterior cerebral arteries widely patent. M1 segments patent without stenosis. No proximal M2 occlusion. Distal MCA branches well perfused and symmetric. Posterior circulation: Right vertebral artery patent to the vertebrobasilar junction without stenosis. Atheromatous plaque at the left V4 segment with mild to moderate stenosis. Posterior inferior cerebral arteries patent bilaterally. Basilar widely patent to its distal aspect. Superior cerebellar arteries patent bilaterally. Tandem moderate to severe mid-distal left P2 stenoses. PCAs otherwise widely patent. Venous sinuses: Patent. Anatomic variants: None significant. Delayed phase: No abnormal enhancement. Review of the MIP images confirms the above findings IMPRESSION: 1. Negative CTA for emergent large vessel occlusion. 2. Atheromatous plaque about the proximal ICAs  with relatively mild stenosis of up to 30-40% by NASCET criteria, right slightly worse than left. 3. Carotid siphon atherosclerotic change with resultant mild to moderate stenoses at the para clinoid segments. 4. Tandem moderate to severe mid-distal left P2 stenoses. 5. Emphysema with findings suggestive of superimposed pulmonary edema. Electronically Signed   By: Jeannine Boga M.D.   On: 04/12/2018 04:06   Dg Chest 2 View  Result Date: 04/12/2018 CLINICAL DATA:  STROKE-LIKE SYMPTOMS, SORE THROAT, HTN, FORMER SMOKER X 20 YEARS EXAM: CHEST - 2 VIEW COMPARISON:  08/15/2014 FINDINGS: Low lung volumes. Resulting crowding of bronchovascular structures. Left infrahilar subsegmental atelectasis or infiltrate. Heart size and mediastinal contours are within normal limits. Aortic Atherosclerosis (ICD10-170.0) No effusion. Previous median sternotomy and CABG. IMPRESSION: 1. Low volumes with left infrahilar atelectasis or infiltrate. Electronically Signed   By: Lucrezia Europe M.D.   On: 04/12/2018 09:51   Ct Angio Neck W And/or Wo Contrast  Result Date: 04/12/2018 CLINICAL DATA:  Initial evaluation for acute  left-sided weakness. EXAM: CT ANGIOGRAPHY HEAD AND NECK TECHNIQUE: Multidetector CT imaging of the head and neck was performed using the standard protocol during bolus administration of intravenous contrast. Multiplanar CT image reconstructions and MIPs were obtained to evaluate the vascular anatomy. Carotid stenosis measurements (when applicable) are obtained utilizing NASCET criteria, using the distal internal carotid diameter as the denominator. CONTRAST:  125mL ISOVUE-370 IOPAMIDOL (ISOVUE-370) INJECTION 76% COMPARISON:  Prior head CT from earlier same day. FINDINGS: CTA NECK FINDINGS Aortic arch: Visualized arch of normal caliber with normal Chidera Dearcos pattern. Atherosclerotic change about the arch and origin of the great vessels without significant stenosis. Visualized subclavian arteries patent. Right carotid  system: Right common carotid artery patent to the bifurcation without stenosis. Mixed plaque about the proximal right ICA with associated narrowing of up to 40% by NASCET criteria. Right ICA patent distally to the skull base. Left carotid system: Left common carotid artery patent to the bifurcation without significant stenosis. Atheromatous plaque about the proximal left ICA with associated mild narrowing of no more than 30% by NASCET criteria. Left ICA widely patent distally to the skull base. Vertebral arteries: Both of the vertebral arteries arise from the subclavian arteries. Atheromatous plaque at the origin of the vertebral arteries without hemodynamically significant stenosis. Vertebral arteries otherwise widely patent within the neck without stenosis, dissection, or occlusion. Skeleton: No acute osseous abnormality. 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 No worrisome lytic or blastic osseous lesions. Median sternotomy wires noted. Other neck: No acute soft tissue abnormality within the neck. Salivary and thyroid glands are normal. No adenopathy. Upper chest: Visualized upper mediastinum within normal limits. Emphysema noted. Superimposed diffuse ground-glass opacity with interlobular septal thickening, most suggestive of edema. Review of the MIP images confirms the above findings CTA HEAD FINDINGS Anterior circulation: Petrous segments widely patent. Scattered atheromatous plaque within the cavernous/supraclinoid ICAs. Mild-to-moderate stenosis at the para clinoid segments bilaterally. ICA termini widely patent. A1 segments, anterior communicating artery, and anterior cerebral arteries widely patent. M1 segments patent without stenosis. No proximal M2 occlusion. Distal MCA branches well perfused and symmetric. Posterior circulation: Right vertebral artery patent to the vertebrobasilar junction without stenosis. Atheromatous plaque at the left V4 segment with mild to moderate  stenosis. Posterior inferior cerebral arteries patent bilaterally. Basilar widely patent to its distal aspect. Superior cerebellar arteries patent bilaterally. Tandem moderate to severe mid-distal left P2 stenoses. PCAs otherwise widely patent. Venous sinuses: Patent. Anatomic variants: None significant. Delayed phase: No abnormal enhancement. Review of the MIP images confirms the above findings IMPRESSION: 1. Negative CTA for emergent large vessel occlusion. 2. Atheromatous plaque about the proximal ICAs with relatively mild stenosis of up to 30-40% by NASCET criteria, right slightly worse than left. 3. Carotid siphon atherosclerotic change with resultant mild to moderate stenoses at the para clinoid segments. 4. Tandem moderate to severe mid-distal left P2 stenoses. 5. Emphysema with findings suggestive of superimposed pulmonary edema. Electronically Signed   By: Jeannine Boga M.D.   On: 04/12/2018 04:06   Mr Brain Wo Contrast  Result Date: 04/12/2018 CLINICAL DATA:  Left-sided weakness and confusion for 2 days EXAM: MRI HEAD WITHOUT CONTRAST MRA HEAD WITHOUT CONTRAST TECHNIQUE: Multiplanar, multiecho pulse sequences of the brain and surrounding structures were obtained without intravenous contrast. Angiographic images of the head were obtained using MRA technique without contrast. COMPARISON:  Head CT from earlier today FINDINGS: MRI HEAD FINDINGS Brain: Restricted diffusion in the right corona radiata region the caudate body measuring 12 mm. Mild cerebral  volume loss. Mild chronic small vessel ischemia in the cerebral white matter. No hemorrhage, hydrocephalus, or collection. Vascular: Arterial findings below. Preserved dural venous sinus flow voids. Skull and upper cervical spine: No evidence of marrow lesion. Sinuses/Orbits: Right cataract resection. MRA HEAD FINDINGS Symmetric carotid and vertebral arteries. Incomplete circle-of-Willis by MRA. No Louetta Hollingshead occlusion or proximal flow limiting stenosis.  Atheromatous irregularity of medium size vessels, most notably a moderate left P2 segment stenosis. Negative for aneurysm. IMPRESSION: Brain MRI: 1. 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 2. Mild chronic small vessel ischemia in the cerebral white matter. Intracranial MRA: No acute finding.  Stable from CTA earlier today. Electronically Signed   By: Monte Fantasia M.D.   On: 04/12/2018 08:22   US Carotid Bilateral (at [facility] And [facility] Only)  Result Date: 04/12/2018 CLINICAL DATA:  Left facial and lower extremity weakness. CTA describes bilateral bifurcation plaque with only mild stenosis. EXAM: BILATERAL CAROTID DUPLEX ULTRASOUND TECHNIQUE: Pearline Cables scale imaging, color Doppler and duplex ultrasound was performed of bilateral carotid and vertebral arteries in the neck. COMPARISON:  CTA from the same day TECHNIQUE: Quantification of carotid stenosis is based on velocity parameters that correlate the residual internal carotid diameter with NASCET-based stenosis levels, using the diameter of the distal internal carotid lumen as the denominator for stenosis measurement. The following velocity measurements were obtained: PEAK SYSTOLIC/END DIASTOLIC RIGHT ICA:                     70/19cm/sec CCA:                     06/26RS/WNI SYSTOLIC ICA/CCA RATIO:  1.0 ECA:                     86cm/sec LEFT ICA:                     62/17cm/sec CCA:                     62/70JJ/KKX SYSTOLIC ICA/CCA RATIO:  0.9 ECA:                     60cm/sec FINDINGS: RIGHT CAROTID ARTERY: Mild plaque through the common carotid artery. Partially calcified eccentric plaque in the carotid bulb extending into proximal internal and external carotid arteries. Some areas of distal acoustic shadowing. Normal waveforms and color Doppler signal in visualized segments. RIGHT VERTEBRAL ARTERY:  Normal flow direction and waveform. LEFT CAROTID ARTERY: Plaque throughout the common carotid artery without high-grade stenosis. Eccentric  partially calcified plaque in the distal common carotid artery, bulb, extending into the proximal ICA. No high-grade stenosis. Normal waveforms and color Doppler signal. LEFT VERTEBRAL ARTERY: Normal flow direction and waveform. IMPRESSION: 1. Bilateral carotid bifurcation and proximal ICA plaque, right greater than left, resulting in less than 50% diameter stenosis. 2.  Antegrade bilateral vertebral arterial flow. Electronically Signed   By: Lucrezia Europe M.D.   On: 04/12/2018 09:49   US Arterial Abi (screening Lower Extremity)  Result Date: 04/12/2018 CLINICAL DATA:  77 year old male with suspected peripheral arterial disease EXAM: NONINVASIVE PHYSIOLOGIC VASCULAR STUDY OF BILATERAL LOWER EXTREMITIES TECHNIQUE: Evaluation of both lower extremities were performed at rest, including calculation of ankle-brachial indices with single level Doppler, pressure and pulse volume recording. COMPARISON:  None. FINDINGS: Right ABI:  1.0 Left ABI:  0.84 Right Lower Extremity:  Normal arterial waveforms at the ankle. Left Lower  Extremity: Mildly blunted arterial waveforms at the ankle. 0.8-0.89 Mild PAD IMPRESSION: 1. Abnormal resting left ankle-brachial index of 0.84 consistent with at least mild peripheral arterial disease. 2. Normal resting right ankle-brachial index of 1.0 Electronically Signed   By: Jacqulynn Cadet M.D.   On: 04/12/2018 08:52   Mr Jodene Nam Head/brain DP Cm  Result Date: 04/12/2018 CLINICAL DATA:  Left-sided weakness and confusion for 2 days EXAM: MRI HEAD WITHOUT CONTRAST MRA HEAD WITHOUT CONTRAST TECHNIQUE: Multiplanar, multiecho pulse sequences of the brain and surrounding structures were obtained without intravenous contrast. Angiographic images of the head were obtained using MRA technique without contrast. COMPARISON:  Head CT from earlier today FINDINGS: MRI HEAD FINDINGS Brain: Restricted diffusion in the right corona radiata region the caudate body measuring 12 mm. Mild cerebral volume loss. Mild  chronic small vessel ischemia in the cerebral white matter. No hemorrhage, hydrocephalus, or collection. Vascular: Arterial findings below. Preserved dural venous sinus flow voids. Skull and upper cervical spine: No evidence of marrow lesion. Sinuses/Orbits: Right cataract resection. MRA HEAD FINDINGS Symmetric carotid and vertebral arteries. Incomplete circle-of-Willis by MRA. No Fletcher Rathbun occlusion or proximal flow limiting stenosis. Atheromatous irregularity of medium size vessels, most notably a moderate left P2 segment stenosis. Negative for aneurysm. IMPRESSION: Brain MRI: 1. 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 2. Mild chronic small vessel ischemia in the cerebral white matter. Intracranial MRA: No acute finding.  Stable from CTA earlier today. Electronically Signed   By: Monte Fantasia M.D.   On: 04/12/2018 08:22   Ct Head Code Stroke Wo Contrast  Result Date: 04/12/2018 CLINICAL DATA:  Code stroke. Initial evaluation for acute left-sided weakness. EXAM: CT HEAD WITHOUT CONTRAST TECHNIQUE: Contiguous axial images were obtained from the base of the skull through the vertex without intravenous contrast. COMPARISON:  Prior MRI from 07/31/2014. FINDINGS: Brain: Age-related cerebral atrophy with mild chronic small vessel ischemic change. No acute intracranial hemorrhage. No acute large vessel territory infarct. No mass lesion, midline shift or mass effect. No hydrocephalus. No extra-axial fluid collection. Vascular: No asymmetric hyperdense vessel. Calcified atherosclerotic change at the skull base. Skull: Scalp soft tissues within normal limits.  Calvarium intact. Sinuses/Orbits: Globes and orbital soft tissues within normal limits. Paranasal sinuses and mastoid air cells are clear. Other: None. ASPECTS [facility] Stroke Program Early CT Score) - Ganglionic level infarction (caudate, lentiform nuclei, internal capsule, insula, M1-M3 cortex): 7 - Supraganglionic infarction (M4-M6  cortex): 3 Total score (0-10 with 10 being normal): 10 IMPRESSION: 1. No acute intracranial infarct or other abnormality identified. 2. ASPECTS is 10. 3. Mild age-related cerebral atrophy with chronic small vessel ischemic disease. Critical Value/emergent results were called by telephone at the time of interpretation on 04/12/2018 at 2:40 am to Dr. Ripley Fraise , who verbally acknowledged these results. Electronically Signed   By: Jeannine Boga M.D.   On: 04/12/2018 02:41    Cardiac Studies    Patient Profile     Mr. Menna 77 yo male history of CAD with CABG in 1997, stent to SVG-PDA and PTCA of the LAD in 05/2002. Normal LVEF at that time. 2008 heart cath [facility] had repeat stenting, he is unsure of the details. History of AAA, HTN, HL, copd. Admitted with right leg weakness. As part of workup a troponin was checked and found to be elevated     Assessment & Plan    1. Elevated troponin/CAD/Systoilc heart dysfunction - - patient presented with leg weakness, as part of  workup troponin was checked and found to be elevated. He has had no cardiopulmonary symptoms. Known extensive history of prior CAD.  - trop peak to 0.87. His LVEF is 30-35%. Last known LVEF from cath LVgram 45% in 2015, unclear chronicitiy of drop - medical therapy with ASA, hep gtt. His beta blocker and ACE-I held due to permissive HTN. Start atorva 80mg  daily.   - difficult clinical situation. Elevated trop in absence of cardiopulmonary symptoms presenting with CVA, drop in LVEF without clear documented timing given last LVEF was by LV gram in 2015. I think given the combo of his trop rise and decreased LVEF  findings he does warrant a cath, would lean toward Monday giving him additional time to recover from his CVA since admitted just yesterday AM. - start Toprol xl 25 mg  and entresto 24/26mg  bid once outside of permissive HTN window.  - plavix allergy, would need brillinta if stented. Ok for anticoag per  neuro.    - Patient wishes to think over cath, I will come back this afternoon to talk again with him.    For questions or updates, please contact [place] Please consult [email or web address] for contact info under Cardiology/STEMI.      Merrily Pew, MD  04/13/2018, 8:24 AM

## 2018-04-13 NOTE — Progress Notes (Addendum)
ANTICOAGULATION CONSULT NOTE - Initial Consult  Pharmacy Consult for Heparin Indication: chest pain/ACS  Allergies  Allergen Reactions  . Dye Fdc Red [Red Dye]     hives  . Ivp Dye [Iodinated Diagnostic Agents] Hives  . Keflex [Cephalexin]     hoarsness  . Plavix [Clopidogrel] Hives    Unsure if dye or Plavix   Patient Measurements: Height: 6' (182.9 cm) Weight: 193 lb (87.5 kg) IBW/kg (Calculated) : 77.6 HEPARIN DW (KG): 87.5   Vital Signs: Temp: 98.6 F (37 C) (05/10 0405) Temp Source: Oral (05/10 0405) BP: 124/75 (05/10 0405) Pulse Rate: 79 (05/10 0405)  Labs: Recent Labs    04/12/18 0234 04/12/18 0246 04/12/18 1006 04/12/18 1405 04/13/18 0512  HGB 14.8 14.6  --   --  14.5  HCT 41.9 43.0  --   --  42.2  PLT 124*  --   --   --  124*  APTT 42*  --   --   --   --   LABPROT 13.5  --   --   --   --   INR 1.04  --   --   --   --   CREATININE 1.09 1.10  --   --  1.05  TROPONINI 0.06*  --  0.87* 0.86* 0.52*   Estimated Creatinine Clearance: 65.7 mL/min (by C-G formula based on SCr of 1.05 mg/dL).  Medical History: Past Medical History:  Diagnosis Date  . AAA (abdominal aortic aneurysm) ([place])   . CAD (coronary artery disease)    CABG '97, multiple PCIs  . COPD (chronic obstructive pulmonary disease) ([place])   . Diabetes mellitus    diet controlled  . Dyslipidemia   . Hypertension   . Myocardial infarction [facility]) 1997   stents x2 (2003/2007)   Medications:  Medications Prior to Admission  Medication Sig Dispense Refill Last Dose  . atenolol (TENORMIN) 100 MG tablet Take 1 tablet (100 mg total) by mouth daily. 90 tablet 1 Taking  . chlorzoxazone (PARAFON) 500 MG tablet Take 1 tablet (500 mg total) by mouth 3 (three) times daily as needed for muscle spasms. 21 tablet 1 Taking  . HYDROcodone-acetaminophen (NORCO) 10-325 MG tablet 1 bid prn pain 60 tablet 0   . ibuprofen (ADVIL,MOTRIN) 200 MG tablet Take 600 mg by mouth daily as needed (pain). Reported on  03/02/2016   Taking  . ketoconazole (NIZORAL) 2 % cream Apply 1 application topically 2 (two) times daily. APPLY BID TO RASH IN GROIN AREA 15 g 0   . lisinopril (PRINIVIL,ZESTRIL) 10 MG tablet TAKE ONE TABLET BY MOUTH ONCE DAILY 90 tablet 1 Taking  . nitroGLYCERIN (NITROSTAT) 0.4 MG SL tablet Place 1 tablet (0.4 mg total) under the tongue every 5 (five) minutes as needed for chest pain. 30 tablet 0 Taking  . traMADol (ULTRAM) 50 MG tablet 1 bid prn 30 tablet 1    Assessment: Okay for Protocol, recent neuro work-up for stroke.  Now to start Heparin for chest pain/ACS.  Baseline INR WNL, aPTT slightly elevated.  PLTC 124K,  H/H WNL.  Previous SQ Lovenox - stopped.  Goal of Therapy:  Heparin level 0.3-0.7 units/ml Monitor platelets by anticoagulation protocol: Yes   Plan:  No bolus Start heparin infusion at 1100 units/hr Check anti-Xa level in 6-8 hours and daily while on heparin Continue to monitor H&H and platelets  Pricilla Larsson 04/13/2018,8:03 AM

## 2018-04-13 NOTE — Progress Notes (Signed)
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    He was noted to have a troponin elevation for which cardiology was consulted given his prior history of CAD and MI.  Echocardiogram compared to repeat on 2015 appears to demonstrate some worsening LVEF and he has been recommended cardiac catheterization with plans to have this done on 5/13.   Assessment & Plan:   Principal Problem:   Stroke-like symptoms Active Problems:   Dyslipidemia   Type II diabetes mellitus, well controlled (HCC)   HTN (hypertension), benign   COPD (chronic obstructive pulmonary disease) (HCC)   CAD (coronary artery disease)   Elevated troponin   Claudication ([place])   1. 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  Continue full dose aspirin per neurology recommendations as well as statin. 2. Elevated troponin with systolic heart dysfunction in the setting of severe CAD.  Continue medical therapy with aspirin and heparin drip.  His beta-blockade and ACE have been held due to permissive hypertension.  Atorvastatin 80 mg daily have been initiated.  Will initiate Toprol-XL 25 mg and Entresto 24/26 mg twice daily once outside permissive hypertension window.  Plan for transfer for  cardiac catheterization to [facility] over the weekend as this is planned for 5/13. 3. Dyslipidemia.  Continue statin as prescribed. 4. Type 2 diabetes.  Carb modified diet with sliding scale insulin and close CBG monitoring. 5. COPD.  Bronchodilators as needed.  Currently asymptomatic. 6. Claudication.  Mild PAD noted on ABI on the left side.  Continue current antiplatelet agents as well as statin.   DVT prophylaxis:Heparin drip per ACS protocol initiated Code Status: Full Family Communication: None currently at bedside Disposition Plan: Plan for transfer to [facility] over the weekend for planned cardiac catheterization by Monday 5/13.   Consultants:   Cardiology  Neurology  Procedures:   None  Antimicrobials:   None   Subjective: Patient seen and evaluated today with no new acute complaints or concerns. No acute concerns or events noted overnight.  He has been seen by neurology with recommendations for full dose aspirin as well as cardiology with recommendations for heparin drip and transferred for cardiac catheterization recommended on Monday.  Objective: Vitals:   04/13/18 0405 04/13/18 0805 04/13/18 0805 04/13/18 1205  BP: 124/75  132/79 128/80  Pulse: 79  78 83  Resp: 17  18 18   Temp: 98.6 F (37 C) 98.8 F (37.1 C)  97.8 F (36.6 C)  TempSrc: Oral Oral  Oral  SpO2: 94%  95% 97%  Weight:      Height:        Intake/Output Summary (Last 24 hours) at 04/13/2018 1516 Last data filed at 04/13/2018 1436 Gross per 24 hour  Intake 852.5 ml  Output 2100 ml  Net -1247.5 ml   Filed Weights   04/12/18 0257  Weight: 87.5 kg (193 lb)    Examination:  General exam: Appears calm and comfortable  Respiratory system: Clear to auscultation. Respiratory effort normal. Cardiovascular system: S1 & S2 heard, RRR. No JVD, murmurs, rubs, gallops or clicks. No pedal edema. Gastrointestinal system: Abdomen is nondistended, soft and nontender. No organomegaly or masses felt. Normal  bowel sounds heard. Central nervous system: Alert and oriented. No focal neurological deficits. Extremities: Symmetric 5 x 5 power. Skin: No rashes, lesions or ulcers Psychiatry: Judgement and insight appear normal. Mood & affect appropriate.     Data Reviewed: I have personally reviewed following labs and imaging studies  CBC: Recent Labs  Lab 04/12/18 0234 04/12/18 0246 04/13/18 0512  WBC 7.2  --  8.3  NEUTROABS 3.2  --   --   HGB 14.8 14.6 14.5  HCT 41.9 43.0 42.2  MCV 92.1  --  93.2  PLT 124*  --  326*   Basic Metabolic Panel: Recent Labs  Lab 04/12/18 0234 04/12/18 0246 04/13/18 0512  NA 133* 135 135  K 4.1 4.1 4.0  CL 98* 99* 102  CO2 25  --  25  GLUCOSE 202* 201* 135*  BUN 15 14 12   CREATININE 1.09 1.10 1.05  CALCIUM 9.6  --  8.7*  MG 1.8  --   --   PHOS 3.1  --   --    GFR: Estimated Creatinine Clearance: 65.7 mL/min (by C-G formula based on SCr of 1.05 mg/dL). Liver Function Tests: Recent Labs  Lab 04/12/18 0234  AST 31  ALT 39  ALKPHOS 79  BILITOT 0.8  PROT 7.8  ALBUMIN 4.1   No results for input(s): LIPASE, AMYLASE in the last 168 hours. No results for input(s): AMMONIA in the last 168 hours. Coagulation Profile: Recent Labs  Lab 04/12/18 0234  INR 1.04   Cardiac Enzymes: Recent Labs  Lab 04/12/18 0234 04/12/18 1006 04/12/18 1405 04/13/18 0512  TROPONINI 0.06* 0.87* 0.86* 0.52*   BNP (last 3 results) No results for input(s): PROBNP in the last 8760 hours. HbA1C: Recent Labs    04/13/18 0512  HGBA1C 7.7*   CBG: Recent Labs  Lab 04/12/18 1132 04/12/18 1616 04/12/18 2142 04/13/18 0725 04/13/18 1143  GLUCAP 200* 185* 200* 177* 150*   Lipid Profile: Recent Labs    04/13/18 0512  CHOL 198  HDL 29*  LDLCALC 139*  TRIG 152*  CHOLHDL 6.8   Thyroid Function Tests: No results for input(s): TSH, T4TOTAL, FREET4, T3FREE, THYROIDAB in the last 72 hours. Anemia Panel: No results for input(s): VITAMINB12, FOLATE,  FERRITIN, TIBC, IRON, RETICCTPCT in the last 72 hours. Sepsis Labs: No results for input(s): PROCALCITON, LATICACIDVEN in the last 168 hours.  No results found for this or any previous visit (from the past 240 hour(s)).       Radiology Studies: Ct Angio Head W Or Wo Contrast  Result Date: 04/12/2018 CLINICAL DATA:  Initial evaluation for acute left-sided weakness. EXAM: CT ANGIOGRAPHY HEAD AND NECK TECHNIQUE: Multidetector CT imaging of the head and neck was performed using the standard protocol during bolus administration of intravenous contrast. Multiplanar CT image reconstructions and MIPs were obtained to evaluate the vascular anatomy. Carotid stenosis measurements (when applicable) are obtained utilizing NASCET criteria, using the distal internal carotid diameter as the denominator. CONTRAST:  188mL ISOVUE-370 IOPAMIDOL (ISOVUE-370) INJECTION 76% COMPARISON:  Prior head CT from earlier same day. FINDINGS: CTA NECK FINDINGS Aortic arch:  Visualized arch of normal caliber with normal branch pattern. Atherosclerotic change about the arch and origin of the great vessels without significant stenosis. Visualized subclavian arteries patent. Right carotid system: Right common carotid artery patent to the bifurcation without stenosis. Mixed plaque about the proximal right ICA with associated narrowing of up to 40% by NASCET criteria. Right ICA patent distally to the skull base. Left carotid system: Left common carotid artery patent to the bifurcation without significant stenosis. Atheromatous plaque about the proximal left ICA with associated mild narrowing of no more than 30% by NASCET criteria. Left ICA widely patent distally to the skull base. Vertebral arteries: Both of the vertebral arteries arise from the subclavian arteries. Atheromatous plaque at the origin of the vertebral arteries without hemodynamically significant stenosis. Vertebral arteries otherwise widely patent within the neck without  stenosis, dissection, or occlusion. Skeleton: No acute osseous abnormality. 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 No worrisome lytic or blastic osseous lesions. Median sternotomy wires noted. Other neck: No acute soft tissue abnormality within the neck. Salivary and thyroid glands are normal. No adenopathy. Upper chest: Visualized upper mediastinum within normal limits. Emphysema noted. Superimposed diffuse ground-glass opacity with interlobular septal thickening, most suggestive of edema. Review of the MIP images confirms the above findings CTA HEAD FINDINGS Anterior circulation: Petrous segments widely patent. Scattered atheromatous plaque within the cavernous/supraclinoid ICAs. Mild-to-moderate stenosis at the para clinoid segments bilaterally. ICA termini widely patent. A1 segments, anterior communicating artery, and anterior cerebral arteries widely patent. M1 segments patent without stenosis. No proximal M2 occlusion. Distal MCA branches well perfused and symmetric. Posterior circulation: Right vertebral artery patent to the vertebrobasilar junction without stenosis. Atheromatous plaque at the left V4 segment with mild to moderate stenosis. Posterior inferior cerebral arteries patent bilaterally. Basilar widely patent to its distal aspect. Superior cerebellar arteries patent bilaterally. Tandem moderate to severe mid-distal left P2 stenoses. PCAs otherwise widely patent. Venous sinuses: Patent. Anatomic variants: None significant. Delayed phase: No abnormal enhancement. Review of the MIP images confirms the above findings IMPRESSION: 1. Negative CTA for emergent large vessel occlusion. 2. Atheromatous plaque about the proximal ICAs with relatively mild stenosis of up to 30-40% by NASCET criteria, right slightly worse than left. 3. Carotid siphon atherosclerotic change with resultant mild to moderate stenoses at the para clinoid segments. 4. Tandem moderate to severe mid-distal  left P2 stenoses. 5. Emphysema with findings suggestive of superimposed pulmonary edema. Electronically Signed   By: Jeannine Boga M.D.   On: 04/12/2018 04:06   Dg Chest 2 View  Result Date: 04/12/2018 CLINICAL DATA:  STROKE-LIKE SYMPTOMS, SORE THROAT, HTN, FORMER SMOKER X 20 YEARS EXAM: CHEST - 2 VIEW COMPARISON:  08/15/2014 FINDINGS: Low lung volumes. Resulting crowding of bronchovascular structures. Left infrahilar subsegmental atelectasis or infiltrate. Heart size and mediastinal contours are within normal limits. Aortic Atherosclerosis (ICD10-170.0) No effusion. Previous median sternotomy and CABG. IMPRESSION: 1. Low volumes with left infrahilar atelectasis or infiltrate. Electronically Signed   By: Lucrezia Europe M.D.   On: 04/12/2018 09:51   Ct Angio Neck W And/or Wo Contrast  Result Date: 04/12/2018 CLINICAL DATA:  Initial evaluation for acute left-sided weakness. EXAM: CT ANGIOGRAPHY HEAD AND NECK TECHNIQUE: Multidetector CT imaging of the head and neck was performed using the standard protocol during bolus administration of intravenous contrast. Multiplanar CT image reconstructions and MIPs were obtained to evaluate the vascular anatomy. Carotid stenosis measurements (when applicable) are obtained utilizing NASCET criteria, using the distal internal carotid diameter as the  denominator. CONTRAST:  11mL ISOVUE-370 IOPAMIDOL (ISOVUE-370) INJECTION 76% COMPARISON:  Prior head CT from earlier same day. FINDINGS: CTA NECK FINDINGS Aortic arch: Visualized arch of normal caliber with normal branch pattern. Atherosclerotic change about the arch and origin of the great vessels without significant stenosis. Visualized subclavian arteries patent. Right carotid system: Right common carotid artery patent to the bifurcation without stenosis. Mixed plaque about the proximal right ICA with associated narrowing of up to 40% by NASCET criteria. Right ICA patent distally to the skull base. Left carotid system: Left  common carotid artery patent to the bifurcation without significant stenosis. Atheromatous plaque about the proximal left ICA with associated mild narrowing of no more than 30% by NASCET criteria. Left ICA widely patent distally to the skull base. Vertebral arteries: Both of the vertebral arteries arise from the subclavian arteries. Atheromatous plaque at the origin of the vertebral arteries without hemodynamically significant stenosis. Vertebral arteries otherwise widely patent within the neck without stenosis, dissection, or occlusion. Skeleton: No acute osseous abnormality. 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 No worrisome lytic or blastic osseous lesions. Median sternotomy wires noted. Other neck: No acute soft tissue abnormality within the neck. Salivary and thyroid glands are normal. No adenopathy. Upper chest: Visualized upper mediastinum within normal limits. Emphysema noted. Superimposed diffuse ground-glass opacity with interlobular septal thickening, most suggestive of edema. Review of the MIP images confirms the above findings CTA HEAD FINDINGS Anterior circulation: Petrous segments widely patent. Scattered atheromatous plaque within the cavernous/supraclinoid ICAs. Mild-to-moderate stenosis at the para clinoid segments bilaterally. ICA termini widely patent. A1 segments, anterior communicating artery, and anterior cerebral arteries widely patent. M1 segments patent without stenosis. No proximal M2 occlusion. Distal MCA branches well perfused and symmetric. Posterior circulation: Right vertebral artery patent to the vertebrobasilar junction without stenosis. Atheromatous plaque at the left V4 segment with mild to moderate stenosis. Posterior inferior cerebral arteries patent bilaterally. Basilar widely patent to its distal aspect. Superior cerebellar arteries patent bilaterally. Tandem moderate to severe mid-distal left P2 stenoses. PCAs otherwise widely patent. Venous  sinuses: Patent. Anatomic variants: None significant. Delayed phase: No abnormal enhancement. Review of the MIP images confirms the above findings IMPRESSION: 1. Negative CTA for emergent large vessel occlusion. 2. Atheromatous plaque about the proximal ICAs with relatively mild stenosis of up to 30-40% by NASCET criteria, right slightly worse than left. 3. Carotid siphon atherosclerotic change with resultant mild to moderate stenoses at the para clinoid segments. 4. Tandem moderate to severe mid-distal left P2 stenoses. 5. Emphysema with findings suggestive of superimposed pulmonary edema. Electronically Signed   By: Jeannine Boga M.D.   On: 04/12/2018 04:06   Mr Brain Wo Contrast  Result Date: 04/12/2018 CLINICAL DATA:  Left-sided weakness and confusion for 2 days EXAM: MRI HEAD WITHOUT CONTRAST MRA HEAD WITHOUT CONTRAST TECHNIQUE: Multiplanar, multiecho pulse sequences of the brain and surrounding structures were obtained without intravenous contrast. Angiographic images of the head were obtained using MRA technique without contrast. COMPARISON:  Head CT from earlier today FINDINGS: MRI HEAD FINDINGS Brain: Restricted diffusion in the right corona radiata region the caudate body measuring 12 mm. Mild cerebral volume loss. Mild chronic small vessel ischemia in the cerebral white matter. No hemorrhage, hydrocephalus, or collection. Vascular: Arterial findings below. Preserved dural venous sinus flow voids. Skull and upper cervical spine: No evidence of marrow lesion. Sinuses/Orbits: Right cataract resection. MRA HEAD FINDINGS Symmetric carotid and vertebral arteries. Incomplete circle-of-Willis by MRA. No branch occlusion or proximal flow limiting  stenosis. Atheromatous irregularity of medium size vessels, most notably a moderate left P2 segment stenosis. Negative for aneurysm. IMPRESSION: Brain MRI: 1. 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 2. Mild chronic small vessel ischemia  in the cerebral white matter. Intracranial MRA: No acute finding.  Stable from CTA earlier today. Electronically Signed   By: Monte Fantasia M.D.   On: 04/12/2018 08:22   US Carotid Bilateral (at [facility] And Ap Only)  Result Date: 04/12/2018 CLINICAL DATA:  Left facial and lower extremity weakness. CTA describes bilateral bifurcation plaque with only mild stenosis. EXAM: BILATERAL CAROTID DUPLEX ULTRASOUND TECHNIQUE: Pearline Cables scale imaging, color Doppler and duplex ultrasound was performed of bilateral carotid and vertebral arteries in the neck. COMPARISON:  CTA from the same day TECHNIQUE: Quantification of carotid stenosis is based on velocity parameters that correlate the residual internal carotid diameter with NASCET-based stenosis levels, using the diameter of the distal internal carotid lumen as the denominator for stenosis measurement. The following velocity measurements were obtained: PEAK SYSTOLIC/END DIASTOLIC RIGHT ICA:                     70/19cm/sec CCA:                     47/82NF/AOZ SYSTOLIC ICA/CCA RATIO:  1.0 ECA:                     86cm/sec LEFT ICA:                     62/17cm/sec CCA:                     30/86VH/QIO SYSTOLIC ICA/CCA RATIO:  0.9 ECA:                     60cm/sec FINDINGS: RIGHT CAROTID ARTERY: Mild plaque through the common carotid artery. Partially calcified eccentric plaque in the carotid bulb extending into proximal internal and external carotid arteries. Some areas of distal acoustic shadowing. Normal waveforms and color Doppler signal in visualized segments. RIGHT VERTEBRAL ARTERY:  Normal flow direction and waveform. LEFT CAROTID ARTERY: Plaque throughout the common carotid artery without high-grade stenosis. Eccentric partially calcified plaque in the distal common carotid artery, bulb, extending into the proximal ICA. No high-grade stenosis. Normal waveforms and color Doppler signal. LEFT VERTEBRAL ARTERY: Normal flow direction and waveform. IMPRESSION: 1. Bilateral carotid  bifurcation and proximal ICA plaque, right greater than left, resulting in less than 50% diameter stenosis. 2.  Antegrade bilateral vertebral arterial flow. Electronically Signed   By: Lucrezia Europe M.D.   On: 04/12/2018 09:49   US Arterial Abi (screening Lower Extremity)  Result Date: 04/12/2018 CLINICAL DATA:  77 year old male with suspected peripheral arterial disease EXAM: NONINVASIVE PHYSIOLOGIC VASCULAR STUDY OF BILATERAL LOWER EXTREMITIES TECHNIQUE: Evaluation of both lower extremities were performed at rest, including calculation of ankle-brachial indices with single level Doppler, pressure and pulse volume recording. COMPARISON:  None. FINDINGS: Right ABI:  1.0 Left ABI:  0.84 Right Lower Extremity:  Normal arterial waveforms at the ankle. Left Lower Extremity: Mildly blunted arterial waveforms at the ankle. 0.8-0.89 Mild PAD IMPRESSION: 1. Abnormal resting left ankle-brachial index of 0.84 consistent with at least mild peripheral arterial disease. 2. Normal resting right ankle-brachial index of 1.0 Electronically Signed   By: Jacqulynn Cadet M.D.   On: 04/12/2018 08:52   Mr Jodene Nam Head/brain NG Cm  Result Date:  04/12/2018 CLINICAL DATA:  Left-sided weakness and confusion for 2 days EXAM: MRI HEAD WITHOUT CONTRAST MRA HEAD WITHOUT CONTRAST TECHNIQUE: Multiplanar, multiecho pulse sequences of the brain and surrounding structures were obtained without intravenous contrast. Angiographic images of the head were obtained using MRA technique without contrast. COMPARISON:  Head CT from earlier today FINDINGS: MRI HEAD FINDINGS Brain: Restricted diffusion in the right corona radiata region the caudate body measuring 12 mm. Mild cerebral volume loss. Mild chronic small vessel ischemia in the cerebral white matter. No hemorrhage, hydrocephalus, or collection. Vascular: Arterial findings below. Preserved dural venous sinus flow voids. Skull and upper cervical spine: No evidence of marrow lesion. Sinuses/Orbits:  Right cataract resection. MRA HEAD FINDINGS Symmetric carotid and vertebral arteries. Incomplete circle-of-Willis by MRA. No branch occlusion or proximal flow limiting stenosis. Atheromatous irregularity of medium size vessels, most notably a moderate left P2 segment stenosis. Negative for aneurysm. IMPRESSION: Brain MRI: 1. 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 2. Mild chronic small vessel ischemia in the cerebral white matter. Intracranial MRA: No acute finding.  Stable from CTA earlier today. Electronically Signed   By: Monte Fantasia M.D.   On: 04/12/2018 08:22   Ct Head Code Stroke Wo Contrast  Result Date: 04/12/2018 CLINICAL DATA:  Code stroke. Initial evaluation for acute left-sided weakness. EXAM: CT HEAD WITHOUT CONTRAST TECHNIQUE: Contiguous axial images were obtained from the base of the skull through the vertex without intravenous contrast. COMPARISON:  Prior MRI from 07/31/2014. FINDINGS: Brain: Age-related cerebral atrophy with mild chronic small vessel ischemic change. No acute intracranial hemorrhage. No acute large vessel territory infarct. No mass lesion, midline shift or mass effect. No hydrocephalus. No extra-axial fluid collection. Vascular: No asymmetric hyperdense vessel. Calcified atherosclerotic change at the skull base. Skull: Scalp soft tissues within normal limits.  Calvarium intact. Sinuses/Orbits: Globes and orbital soft tissues within normal limits. Paranasal sinuses and mastoid air cells are clear. Other: None. ASPECTS [facility] Stroke Program Early CT Score) - Ganglionic level infarction (caudate, lentiform nuclei, internal capsule, insula, M1-M3 cortex): 7 - Supraganglionic infarction (M4-M6 cortex): 3 Total score (0-10 with 10 being normal): 10 IMPRESSION: 1. No acute intracranial infarct or other abnormality identified. 2. ASPECTS is 10. 3. Mild age-related cerebral atrophy with chronic small vessel ischemic disease. Critical Value/emergent results  were called by telephone at the time of interpretation on 04/12/2018 at 2:40 am to Dr. Ripley Fraise , who verbally acknowledged these results. Electronically Signed   By: Jeannine Boga M.D.   On: 04/12/2018 02:41     Scheduled Meds: .  stroke: mapping our early stages of recovery book   Does not apply Once  . aspirin EC  325 mg Oral Daily  . atorvastatin  80 mg Oral q1800  . insulin aspart  0-15 Units Subcutaneous TID WC   Continuous Infusions: . heparin 1,100 Units/hr (04/13/18 0841)     LOS: 0 days    Time spent: 30 minutes    [place] Darleen Crocker, DO [facility] Pager [phone]  If 7PM-7AM, please contact night-coverage [email or web address] Password [email or web address] 04/13/2018, 3:16 PM

## 2018-04-14 DIAGNOSIS — E1165 Type 2 diabetes mellitus with hyperglycemia: Secondary | ICD-10-CM | POA: Diagnosis present

## 2018-04-14 DIAGNOSIS — I6340 Cerebral infarction due to embolism of unspecified cerebral artery: Secondary | ICD-10-CM | POA: Diagnosis not present

## 2018-04-14 DIAGNOSIS — Z79899 Other long term (current) drug therapy: Secondary | ICD-10-CM | POA: Diagnosis not present

## 2018-04-14 DIAGNOSIS — R29810 Facial weakness: Secondary | ICD-10-CM | POA: Diagnosis present

## 2018-04-14 DIAGNOSIS — I110 Hypertensive heart disease with heart failure: Secondary | ICD-10-CM | POA: Diagnosis present

## 2018-04-14 DIAGNOSIS — E1151 Type 2 diabetes mellitus with diabetic peripheral angiopathy without gangrene: Secondary | ICD-10-CM | POA: Diagnosis present

## 2018-04-14 DIAGNOSIS — I2582 Chronic total occlusion of coronary artery: Secondary | ICD-10-CM | POA: Diagnosis present

## 2018-04-14 DIAGNOSIS — Z87891 Personal history of nicotine dependence: Secondary | ICD-10-CM | POA: Diagnosis not present

## 2018-04-14 DIAGNOSIS — Z9119 Patient's noncompliance with other medical treatment and regimen: Secondary | ICD-10-CM | POA: Diagnosis not present

## 2018-04-14 DIAGNOSIS — R29700 NIHSS score 0: Secondary | ICD-10-CM | POA: Diagnosis present

## 2018-04-14 DIAGNOSIS — I639 Cerebral infarction, unspecified: Secondary | ICD-10-CM | POA: Diagnosis not present

## 2018-04-14 DIAGNOSIS — R2990 Unspecified symptoms and signs involving the nervous system: Secondary | ICD-10-CM | POA: Diagnosis not present

## 2018-04-14 DIAGNOSIS — Z8249 Family history of ischemic heart disease and other diseases of the circulatory system: Secondary | ICD-10-CM | POA: Diagnosis not present

## 2018-04-14 DIAGNOSIS — I5043 Acute on chronic combined systolic (congestive) and diastolic (congestive) heart failure: Secondary | ICD-10-CM | POA: Diagnosis not present

## 2018-04-14 DIAGNOSIS — R531 Weakness: Secondary | ICD-10-CM | POA: Diagnosis present

## 2018-04-14 DIAGNOSIS — R748 Abnormal levels of other serum enzymes: Secondary | ICD-10-CM | POA: Diagnosis not present

## 2018-04-14 DIAGNOSIS — E785 Hyperlipidemia, unspecified: Secondary | ICD-10-CM | POA: Diagnosis present

## 2018-04-14 DIAGNOSIS — J449 Chronic obstructive pulmonary disease, unspecified: Secondary | ICD-10-CM | POA: Diagnosis present

## 2018-04-14 DIAGNOSIS — I5042 Chronic combined systolic (congestive) and diastolic (congestive) heart failure: Secondary | ICD-10-CM | POA: Diagnosis not present

## 2018-04-14 DIAGNOSIS — G8194 Hemiplegia, unspecified affecting left nondominant side: Secondary | ICD-10-CM | POA: Diagnosis not present

## 2018-04-14 DIAGNOSIS — I429 Cardiomyopathy, unspecified: Secondary | ICD-10-CM | POA: Diagnosis not present

## 2018-04-14 DIAGNOSIS — I252 Old myocardial infarction: Secondary | ICD-10-CM | POA: Diagnosis not present

## 2018-04-14 DIAGNOSIS — Z955 Presence of coronary angioplasty implant and graft: Secondary | ICD-10-CM | POA: Diagnosis not present

## 2018-04-14 DIAGNOSIS — I2510 Atherosclerotic heart disease of native coronary artery without angina pectoris: Secondary | ICD-10-CM | POA: Diagnosis not present

## 2018-04-14 LAB — GLUCOSE, CAPILLARY
Glucose-Capillary: 132 mg/dL — ABNORMAL HIGH (ref 65–99)
Glucose-Capillary: 182 mg/dL — ABNORMAL HIGH (ref 65–99)
Glucose-Capillary: 176 mg/dL — ABNORMAL HIGH (ref 65–99)
Glucose-Capillary: 133 mg/dL — ABNORMAL HIGH (ref 65–99)
Glucose-Capillary: 136 mg/dL — ABNORMAL HIGH (ref 65–99)

## 2018-04-14 LAB — CBC
HCT: 41.3 % (ref 39.0–52.0)
Hemoglobin: 14.1 g/dL (ref 13.0–17.0)
MCH: 32.3 pg (ref 26.0–34.0)
MCHC: 34.1 g/dL (ref 30.0–36.0)
MCV: 94.7 fL (ref 78.0–100.0)
Platelets: 125 10*3/uL — ABNORMAL LOW (ref 150–400)
RBC: 4.36 MIL/uL (ref 4.22–5.81)
RDW: 13.1 % (ref 11.5–15.5)
WBC: 9 10*3/uL (ref 4.0–10.5)

## 2018-04-14 LAB — HEPARIN LEVEL (UNFRACTIONATED): Heparin Unfractionated: 0.51 IU/mL (ref 0.30–0.70)

## 2018-04-14 MED ORDER — SACUBITRIL-VALSARTAN 24-26 MG PO TABS
1.0000 | ORAL_TABLET | Freq: Two times a day (BID) | ORAL | Status: DC
  Administered 2018-04-14 – 2018-04-17 (×6): 1 via ORAL
  Filled 2018-04-14 (×11): qty 1

## 2018-04-14 MED ORDER — METOPROLOL SUCCINATE ER 25 MG PO TB24
25.0000 mg | ORAL_TABLET | Freq: Every day | ORAL | Status: DC
  Administered 2018-04-14 – 2018-04-17 (×4): 25 mg via ORAL
  Filled 2018-04-14 (×4): qty 1

## 2018-04-14 MED ORDER — SODIUM CHLORIDE 0.9 % IV SOLN
INTRAVENOUS | Status: AC
  Administered 2018-04-14 (×2): via INTRAVENOUS

## 2018-04-14 MED ORDER — BISACODYL 10 MG RE SUPP
10.0000 mg | Freq: Once | RECTAL | Status: AC
  Administered 2018-04-14: 10 mg via RECTAL
  Filled 2018-04-14: qty 1

## 2018-04-14 NOTE — Progress Notes (Signed)
PROGRESS NOTE    Peter [name]  ELF:[id number] DOB: [birth year] DOA: 04/12/2018 PCP: Kathyrn Drown, MD   Brief Narrative:   Peter [name] a 77 y.o.malewith medical history significant ofAAA, CAD/CABG in 55 with multiple PCI's, history of MI, history of stent placement x2 in 2003 in 2007, COPD, type 2 diabetes, dyslipidemia, hypertension who is coming to the emergency department with complaints of right lower extremity weaknessassociated with left facial weakness while he was watching TV.  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    He was noted to have a troponin elevation for which cardiology was consulted given his prior history of CAD and MI.  Echocardiogram compared to repeat on 2015 appears to demonstrate some worsening LVEF and he has been recommended cardiac catheterization with plans to have this done on 5/13 at [facility].   Assessment & Plan:   Principal Problem:   Stroke-like symptoms Active Problems:   Dyslipidemia   Type II diabetes mellitus, well controlled (HCC)   HTN (hypertension), benign   COPD (chronic obstructive pulmonary disease) (HCC)   CAD (coronary artery disease)   Elevated troponin   Claudication ([place])   1. 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  Continue full dose aspirin per neurology recommendations as well as statin. Recommendations for SNF by PT on discharge noted. 2. Elevated troponin with systolic heart dysfunction in the setting of severe CAD.  Continue medical therapy with aspirin and heparin drip.  His beta-blockade and ACE have been held due to permissive hypertension.  Atorvastatin 80 mg daily have been initiated.  Initiated Toprol-XL 25 mg and Entresto 24/26 mg twice daily now that it has been  24-48 hours outside permissive HTN window.  Plan for transfer for cardiac catheterization to [facility] over today as this is planned for 5/13. 3. Dyslipidemia.  Continue statin as prescribed. 4. Type 2 diabetes.  Carb modified diet with sliding scale insulin and close CBG monitoring. 5. COPD.  Bronchodilators as needed.  Currently asymptomatic. 6. Claudication.  Mild PAD noted on ABI on the left side.  Continue current antiplatelet agents as well as statin.   DVT prophylaxis:Heparin drip per ACS protocol initiated Code Status: Full Family Communication: None currently at bedside Disposition Plan: Plan for transfer to [facility] over the weekend for planned cardiac catheterization by Monday 5/13.   Consultants:   Cardiology  Neurology  Procedures:   None  Antimicrobials:   None   Subjective: Patient seen and evaluated today with no new acute complaints or concerns. No acute concerns or events noted overnight.  He has been seen by neurology with recommendations for full dose aspirin as well as cardiology yesterday with recommendations for heparin drip and transferred for cardiac catheterization recommended on Monday.  Objective: Vitals:   04/13/18 2005 04/14/18 0005 04/14/18 0400 04/14/18 0805  BP: 136/78 (!) 161/96 (!) 141/77 129/71  Pulse: 75 76 72 86  Resp: 19 18 16 16   Temp: 98.5 F (36.9 C) 98.4 F (36.9 C) 97.7 F (36.5 C)   TempSrc: Oral Oral Oral   SpO2: 95% 97% 96% 96%  Weight:      Height:        Intake/Output Summary (Last 24 hours) at 04/14/2018 1004 Last data  filed at 04/14/2018 0851 Gross per 24 hour  Intake 1041.48 ml  Output 1700 ml  Net -658.52 ml   Filed Weights   04/12/18 0257  Weight: 87.5 kg (193 lb)    Examination:  General exam: Appears calm and comfortable  Respiratory system: Clear to auscultation. Respiratory effort normal. Cardiovascular system: S1 & S2 heard, RRR. No JVD, murmurs, rubs, gallops or clicks. No pedal  edema. Gastrointestinal system: Abdomen is nondistended, soft and nontender. No organomegaly or masses felt. Normal bowel sounds heard. Central nervous system: Alert and oriented. No focal neurological deficits. Extremities: Symmetric 5 x 5 power. Skin: No rashes, lesions or ulcers Psychiatry: Judgement and insight appear normal. Mood & affect appropriate.     Data Reviewed: I have personally reviewed following labs and imaging studies  CBC: Recent Labs  Lab 04/12/18 0234 04/12/18 0246 04/13/18 0512 04/14/18 0557  WBC 7.2  --  8.3 9.0  NEUTROABS 3.2  --   --   --   HGB 14.8 14.6 14.5 14.1  HCT 41.9 43.0 42.2 41.3  MCV 92.1  --  93.2 94.7  PLT 124*  --  124* 893*   Basic Metabolic Panel: Recent Labs  Lab 04/12/18 0234 04/12/18 0246 04/13/18 0512  NA 133* 135 135  K 4.1 4.1 4.0  CL 98* 99* 102  CO2 25  --  25  GLUCOSE 202* 201* 135*  BUN 15 14 12   CREATININE 1.09 1.10 1.05  CALCIUM 9.6  --  8.7*  MG 1.8  --   --   PHOS 3.1  --   --    GFR: Estimated Creatinine Clearance: 65.7 mL/min (by C-G formula based on SCr of 1.05 mg/dL). Liver Function Tests: Recent Labs  Lab 04/12/18 0234  AST 31  ALT 39  ALKPHOS 79  BILITOT 0.8  PROT 7.8  ALBUMIN 4.1   No results for input(s): LIPASE, AMYLASE in the last 168 hours. No results for input(s): AMMONIA in the last 168 hours. Coagulation Profile: Recent Labs  Lab 04/12/18 0234  INR 1.04   Cardiac Enzymes: Recent Labs  Lab 04/12/18 0234 04/12/18 1006 04/12/18 1405 04/13/18 0512  TROPONINI 0.06* 0.87* 0.86* 0.52*   BNP (last 3 results) No results for input(s): PROBNP in the last 8760 hours. HbA1C: Recent Labs    04/13/18 0512  HGBA1C 7.7*   CBG: Recent Labs  Lab 04/13/18 0725 04/13/18 1143 04/13/18 1656 04/13/18 2201 04/14/18 0808  GLUCAP 177* 150* 227* 108* 132*   Lipid Profile: Recent Labs    04/13/18 0512  CHOL 198  HDL 29*  LDLCALC 139*  TRIG 152*  CHOLHDL 6.8   Thyroid Function  Tests: No results for input(s): TSH, T4TOTAL, FREET4, T3FREE, THYROIDAB in the last 72 hours. Anemia Panel: No results for input(s): VITAMINB12, FOLATE, FERRITIN, TIBC, IRON, RETICCTPCT in the last 72 hours. Sepsis Labs: No results for input(s): PROCALCITON, LATICACIDVEN in the last 168 hours.  No results found for this or any previous visit (from the past 240 hour(s)).       Radiology Studies: No results found.   Scheduled Meds: . aspirin EC  325 mg Oral Daily  . atorvastatin  80 mg Oral q1800  . bisacodyl  10 mg Rectal Once  . insulin aspart  0-15 Units Subcutaneous TID WC  . metoprolol succinate  25 mg Oral Daily  . sacubitril-valsartan  1 tablet Oral BID   Continuous Infusions: . sodium chloride    . heparin 1,100 Units/hr (04/14/18 8101)  LOS: 0 days    Time spent: 30 minutes    Fishel Wamble Darleen Crocker, DO [facility] Pager [phone]  If 7PM-7AM, please contact night-coverage [email or web address] Password [email or web address] 04/14/2018, 10:04 AM

## 2018-04-14 NOTE — Progress Notes (Signed)
Physical Therapy Treatment Patient Details Name: Peter [name] MRN: [id number] DOB: [birth year] Today's Date: 04/14/2018    History of Present Illness Peter [name] is a 77 y.o. male with medical history significant of AAA, CAD/CABG in 65 with multiple PCI's, history of MI, history of stent placement x2 in 2003 in 2007, COPD, type 2 diabetes, dyslipidemia, hypertension who is coming to the emergency department with complaints of right lower extremity weakness associated with left facial weakness while he was watching TV.  No blurred vision, no dizziness, no slurred speech, no headache.  He denies fever, chills, cough, sore throat, chest pain, palpitations, diaphoresis, pitting edema of the lower extremities, abdominal pain, nausea, emesis, diarrhea, constipation, melena or hematochezia.  No dysuria, frequency or hematuria.  Denies skin rashes or pruritus.  No heat or cold intolerance.  No polyuria, polydipsia or blurry vision.  The patient developed left arm weakness while in the ER, but by his neurological symptoms are currently improving    PT Comments    Pt received in bed from nursing and was eager to ambulate and get out of bed. Pt demonstrating improvements throughout transfers, ambulation and balance. He was able to amb 228ft with RW this date, without evidence of imbalance but did demo slow, labored cadence; however, pt very talkative during session and the dual tasking could have impacted his gait speed. Overall, pt has progressed a great deal and PT feels pt could return home with HHPT services versus SNF. PT updated recommendations accordingly. Pt set to be transferred to [facility] for cardiac catheterization and educated pt that his discharge recommendations could change again and he verbalized understanding. Will continue to follow acutely in order to further promote strength, balance, and gait.      Follow Up Recommendations  Home health PT;Supervision/Assistance - 24 hour     Equipment  Recommendations  Rolling walker with 5" wheels    Recommendations for Other Services       Precautions / Restrictions Precautions Precautions: Fall Restrictions Weight Bearing Restrictions: No    Mobility  Bed Mobility Overal bed mobility: Needs Assistance Bed Mobility: Supine to Sit     Supine to sit: Supervision;HOB elevated        Transfers Overall transfer level: Needs assistance Equipment used: Rolling walker (2 wheeled) Transfers: Sit to/from [facility] Sit to Stand: Supervision Stand pivot transfers: Supervision       General transfer comment: min cues for hand placement during transfers with RW; supervision this date, increased time to complete task  Ambulation/Gait Ambulation/Gait assistance: Supervision Ambulation Distance (Feet): 200 Feet Assistive device: Rolling walker (2 wheeled) Gait Pattern/deviations: Decreased step length - right;Decreased step length - left;Decreased stance time - left;Decreased stride length Gait velocity: decreased   General Gait Details: pt very talkative during session which decreased his gait speed; slow labored cadence, no eveidence of unsteadiness; min to no difficulty advancing LLE this date   Stairs             Wheelchair Mobility    Modified Rankin (Stroke Patients Only)       Balance Overall balance assessment: Needs assistance Sitting-balance support: Feet supported;No upper extremity supported Sitting balance-Leahy Scale: Good     Standing balance support: During functional activity;Bilateral upper extremity supported Standing balance-Leahy Scale: Fair Standing balance comment: fair with RW                            Cognition Arousal/Alertness: Awake/alert  Behavior During Therapy: WFL for tasks assessed/performed Overall Cognitive Status: Within Functional Limits for tasks assessed                                        Exercises General Exercises  - Lower Extremity Hip Flexion/Marching: Strengthening;Both;10 reps;Standing;Limitations Hip Flexion/Marching Limitations: BUE supported on RW    General Comments        Pertinent Vitals/Pain Pain Assessment: Faces Faces Pain Scale: Hurts a little bit Pain Location: stomach Pain Descriptors / Indicators: Aching Pain Intervention(s): Limited activity within patient's tolerance;Monitored during session;Repositioned    Home Living                      Prior Function            PT Goals (current goals can now be found in the care plan section) Acute Rehab PT Goals Patient Stated Goal: return home after rehab PT Goal Formulation: With patient/family Time For Goal Achievement: 04/26/18 Potential to Achieve Goals: Good    Frequency    7X/week      PT Plan      Co-evaluation              AM-PAC PT "6 Clicks" Daily Activity  Outcome Measure  Difficulty turning over in bed (including adjusting bedclothes, sheets and blankets)?: A Little Difficulty moving from lying on back to sitting on the side of the bed? : A Little Difficulty sitting down on and standing up from a chair with arms (e.g., wheelchair, bedside commode, etc,.)?: A Little Help needed moving to and from a bed to chair (including a wheelchair)?: A Little Help needed walking in hospital room?: A Little Help needed climbing 3-5 steps with a railing? : A Lot 6 Click Score: 17    End of Session Equipment Utilized During Treatment: Gait belt Activity Tolerance: Patient tolerated treatment well Patient left: in chair;with call bell/phone within reach Nurse Communication: Mobility status PT Visit Diagnosis: Unsteadiness on feet (R26.81);Other abnormalities of gait and mobility (R26.89);Muscle weakness (generalized) (M62.81)     Time: 9628-3662 PT Time Calculation (min) (ACUTE ONLY): 27 min  Charges:  $Gait Training: 8-22 mins $Therapeutic Activity: 8-22 mins                    G Codes:          Peter [name] PT, DPT

## 2018-04-14 NOTE — Progress Notes (Signed)
Pt arrived to unit. NAD. VSS. SR on monitor. Denies pain or needs. Awaiting orders.

## 2018-04-14 NOTE — Progress Notes (Signed)
Spoke with patient placement. No new orders are needed as all previous orders from [facility] are valid. Will notify cardiology when MD comes on at 8pm. Pt in NAD. VSS.

## 2018-04-14 NOTE — Progress Notes (Signed)
[place] for Heparin Indication: chest pain/ACS  Allergies  Allergen Reactions  . Dye Fdc Red [Red Dye]     hives  . Ivp Dye [Iodinated Diagnostic Agents] Hives  . Keflex [Cephalexin]     hoarsness  . Plavix [Clopidogrel] Hives    Unsure if dye or Plavix   Patient Measurements: Height: 6' (182.9 cm) Weight: 193 lb (87.5 kg) IBW/kg (Calculated) : 77.6 HEPARIN DW (KG): 87.5   Vital Signs: Temp: 97.7 F (36.5 C) (05/11 0400) Temp Source: Oral (05/11 0400) BP: 141/77 (05/11 0400) Pulse Rate: 72 (05/11 0400)  Labs: Recent Labs    04/12/18 0234 04/12/18 0246 04/12/18 1006 04/12/18 1405 04/13/18 0512 04/13/18 1535 04/14/18 0557  HGB 14.8 14.6  --   --  14.5  --  14.1  HCT 41.9 43.0  --   --  42.2  --  41.3  PLT 124*  --   --   --  124*  --  125*  APTT 42*  --   --   --   --   --   --   LABPROT 13.5  --   --   --   --   --   --   INR 1.04  --   --   --   --   --   --   HEPARINUNFRC  --   --   --   --   --  0.40 0.51  CREATININE 1.09 1.10  --   --  1.05  --   --   TROPONINI 0.06*  --  0.87* 0.86* 0.52*  --   --    Estimated Creatinine Clearance: 65.7 mL/min (by C-G formula based on SCr of 1.05 mg/dL).  Assessment: Okay for Protocol, recent neuro work-up for stroke. Heparin for chest pain/ACS.  Baseline INR WNL, aPTT slightly elevated.  PLTC 125K,  H/H WNL. Heparin level remains @ goal.  Goal of Therapy:  Heparin level 0.3-0.7 units/ml Monitor platelets by anticoagulation protocol: Yes   Plan:  Continue heparin infusion at 1100 units/hr Check anti-Xa level daily while on heparin Continue to monitor H&H and platelets  Pricilla Larsson 04/14/2018,8:21 AM

## 2018-04-15 DIAGNOSIS — I5043 Acute on chronic combined systolic (congestive) and diastolic (congestive) heart failure: Secondary | ICD-10-CM

## 2018-04-15 LAB — CBC
HCT: 45.2 % (ref 39.0–52.0)
Hemoglobin: 15.9 g/dL (ref 13.0–17.0)
MCH: 33 pg (ref 26.0–34.0)
MCHC: 35.2 g/dL (ref 30.0–36.0)
MCV: 93.8 fL (ref 78.0–100.0)
Platelets: 149 10*3/uL — ABNORMAL LOW (ref 150–400)
RBC: 4.82 MIL/uL (ref 4.22–5.81)
RDW: 13 % (ref 11.5–15.5)
WBC: 8.8 10*3/uL (ref 4.0–10.5)

## 2018-04-15 LAB — GLUCOSE, CAPILLARY
Glucose-Capillary: 141 mg/dL — ABNORMAL HIGH (ref 65–99)
Glucose-Capillary: 125 mg/dL — ABNORMAL HIGH (ref 65–99)
Glucose-Capillary: 138 mg/dL — ABNORMAL HIGH (ref 65–99)
Glucose-Capillary: 110 mg/dL — ABNORMAL HIGH (ref 65–99)

## 2018-04-15 LAB — HEPARIN LEVEL (UNFRACTIONATED): Heparin Unfractionated: 0.38 IU/mL (ref 0.30–0.70)

## 2018-04-15 MED ORDER — SODIUM CHLORIDE 0.9% FLUSH: 3.0000 mL | INTRAVENOUS | Status: DC | PRN

## 2018-04-15 MED ORDER — ASPIRIN 81 MG PO CHEW
81.0000 mg | CHEWABLE_TABLET | ORAL | Status: AC
  Administered 2018-04-16: 81 mg via ORAL

## 2018-04-15 MED ORDER — SODIUM CHLORIDE 0.9 % IV SOLN
250.0000 mL | INTRAVENOUS | Status: DC | PRN
  Administered 2018-04-15: 250 mL via INTRAVENOUS

## 2018-04-15 MED ORDER — SODIUM CHLORIDE 0.9 % WEIGHT BASED INFUSION
3.0000 mL/kg/h | INTRAVENOUS | Status: DC
  Administered 2018-04-16: 3 mL/kg/h via INTRAVENOUS

## 2018-04-15 MED ORDER — SODIUM CHLORIDE 0.9 % WEIGHT BASED INFUSION
1.0000 mL/kg/h | INTRAVENOUS | Status: DC
  Administered 2018-04-16: 1 mL/kg/h via INTRAVENOUS

## 2018-04-15 MED ORDER — SODIUM CHLORIDE 0.9% FLUSH
3.0000 mL | Freq: Two times a day (BID) | INTRAVENOUS | Status: DC
  Administered 2018-04-15: 3 mL via INTRAVENOUS

## 2018-04-15 NOTE — Progress Notes (Signed)
ANTICOAGULATION CONSULT NOTE  Pharmacy Consult for Heparin - Follow-Up Indication: chest pain/ACS  Patient Measurements: Height: 6' (182.9 cm) Weight: 188 lb 0.8 oz (85.3 kg) IBW/kg (Calculated) : 77.6 HEPARIN DW (KG): 85.3   Vital Signs: Temp: 98.7 F (37.1 C) (05/12 0415) Temp Source: Oral (05/12 0415) BP: 128/73 (05/12 0415) Pulse Rate: 71 (05/12 0415)  Labs: Recent Labs    04/12/18 1405 04/13/18 0512 04/13/18 1535 04/14/18 0557  HGB  --  14.5  --  14.1  HCT  --  42.2  --  41.3  PLT  --  124*  --  125*  HEPARINUNFRC  --   --  0.40 0.51  CREATININE  --  1.05  --   --   TROPONINI 0.86* 0.52*  --   --    Estimated Creatinine Clearance: 65.7 mL/min (by C-G formula based on SCr of 1.05 mg/dL).  Assessment: 33 YOM who presented on 5/9 with L-sided weakness and found to have new CVA. The patient also had a bump in troponins concerning for ACS - pharmacy consulted to start Heparin for anticoagulation.   The patient was transferred from [facility] to [facility] for planned cath on Mon, 5/13. A Heparin level this morning resulted as thearpeutic (HL 0.38, goal of 0.3-0.5). Utilizing lower goal range due to recent CVA. Hgb/Hct wnl, plts 149 - but are at the patient's baseline of 120's  Noted plans for cardiac cath on 5/13  Goal of Therapy:  Heparin level 0.3-0.5 units/ml Monitor platelets by anticoagulation protocol: Yes   Plan:  - Continue Heparin at 1100 units/hr (11 ml/hr) - Will continue to monitor for any signs/symptoms of bleeding and will follow up with heparin level in the a.m.   Thank you for allowing pharmacy to be a part of this patient's care.  Alycia Rossetti, PharmD, BCPS Clinical Pharmacist Pager: [phone] Clinical phone for 04/15/2018 from 7a-3:30p: [phone] If after 3:30p, please call main pharmacy at: [phone] 04/15/2018 12:15 PM

## 2018-04-15 NOTE — Progress Notes (Addendum)
Progress Note  Patient Name: Peter [name] Date of Encounter: 04/15/2018  Primary Cardiologist: No primary care provider on file.   Subjective   77 yo with a history of coronary artery disease-status post coronary artery bypass grafting in 1997.  He had several stent placements over the years.  He presents with symptoms of a stroke.  His troponin levels were found to be elevated.  Was also found to have recent decline in his left ventricular systolic function.  He is scheduled for heart catheterization tomorrow.  His left ventricular ejection fraction is now 30 to 35%.  He has grade 2 diastolic dysfunction.  Inpatient Medications    Scheduled Meds: . aspirin EC  325 mg Oral Daily  . atorvastatin  80 mg Oral q1800  . insulin aspart  0-15 Units Subcutaneous TID WC  . metoprolol succinate  25 mg Oral Daily  . sacubitril-valsartan  1 tablet Oral BID   Continuous Infusions: . heparin 1,100 Units/hr (04/15/18 0449)   PRN Meds: acetaminophen **OR** acetaminophen (TYLENOL) oral liquid 160 mg/5 mL **OR** acetaminophen, ondansetron **OR** ondansetron (ZOFRAN) IV   Vital Signs    Vitals:   04/14/18 1824 04/14/18 1936 04/14/18 2351 04/15/18 0415  BP: 139/71 134/68 127/88 128/73  Pulse: 68 68 68 71  Resp:  18 18 18   Temp: 97.7 F (36.5 C) 97.7 F (36.5 C) 97.9 F (36.6 C) 98.7 F (37.1 C)  TempSrc: Oral Oral Oral Oral  SpO2:  97% 98% 94%  Weight:      Height:        Intake/Output Summary (Last 24 hours) at 04/15/2018 1121 Last data filed at 04/15/2018 0900 Gross per 24 hour  Intake 1485.5 ml  Output 2150 ml  Net -664.5 ml   Filed Weights   04/12/18 0257 04/14/18 1749  Weight: 193 lb (87.5 kg) 188 lb 0.8 oz (85.3 kg)    Telemetry     NSR  - Personally Reviewed  ECG     NSR  - Personally Reviewed  Physical Exam   GEN:  Elderly gentleman, no acute distress. Neck: No JVD Cardiac: RRR, no murmurs, rubs, or gallops.  Respiratory: Clear to auscultation  bilaterally. GI: Soft, nontender, non-distended  MS: No edema; No deformity. Neuro:  Nonfocal  Psych: Normal affect   Labs    Chemistry Recent Labs  Lab 04/12/18 0234 04/12/18 0246 04/13/18 0512  NA 133* 135 135  K 4.1 4.1 4.0  CL 98* 99* 102  CO2 25  --  25  GLUCOSE 202* 201* 135*  BUN 15 14 12   CREATININE 1.09 1.10 1.05  CALCIUM 9.6  --  8.7*  PROT 7.8  --   --   ALBUMIN 4.1  --   --   AST 31  --   --   ALT 39  --   --   ALKPHOS 79  --   --   BILITOT 0.8  --   --   GFRNONAA >60  --  >60  GFRAA >60  --  >60  ANIONGAP 10  --  8     Hematology Recent Labs  Lab 04/13/18 0512 04/14/18 0557 04/15/18 1038  WBC 8.3 9.0 8.8  RBC 4.53 4.36 4.82  HGB 14.5 14.1 15.9  HCT 42.2 41.3 45.2  MCV 93.2 94.7 93.8  MCH 32.0 32.3 33.0  MCHC 34.4 34.1 35.2  RDW 12.9 13.1 13.0  PLT 124* 125* 149*    Cardiac Enzymes Recent Labs  Lab 04/12/18 0234  04/12/18 1006 04/12/18 1405 04/13/18 0512  TROPONINI 0.06* 0.87* 0.86* 0.52*   No results for input(s): TROPIPOC in the last 168 hours.   BNPNo results for input(s): BNP, PROBNP in the last 168 hours.   DDimer No results for input(s): DDIMER in the last 168 hours.   Radiology    No results found.  Cardiac Studies     Patient Profile     77 y.o. male with history of coronary artery disease admitted with a stroke.  He is found to have positive troponin levels and a significant decrease in his left ventricular function.  Assessment & Plan    1.  Coronary artery disease: The patient has had coronary artery bypass grafting as well as several stent procedures.  Because of his enzyme elevation and recent decrease in LV function, will we have scheduled him for cardiac catheterization tomorrow.  We discussed the risks, benefits, options of heart catheter station.  He understands and agrees to proceed.  His creatinine is stable at 1.05.  2.  Chronic combined systolic and diastolic congestive heart failure: Patient was  found to have an ejection fraction of 30 to 35%.  He also presented with positive troponin levels.  He will be scheduled for heart catheterization tomorrow.  For questions or updates, please contact [facility] Please consult [email or web address] for contact info under Cardiology/STEMI.      Signed, Mertie Moores, MD  04/15/2018, 11:21 AM

## 2018-04-15 NOTE — Progress Notes (Signed)
PROGRESS NOTE    Peter [name]  WUJ:[id number] DOB: 17-Sep-1941 DOA: 04/12/2018 PCP: Kathyrn Drown, MD   Brief Narrative: Patient is a 77 year old male with past medical history significant for abdominal aortic invasion, coronary artery disease status post CABG in 1997 with multiple PCI's history of MI, COPD, type 2 diabetes, dyslipidemia, hypertension who presented to the emergency department with complaints of right lower extremity weakness with left facial weakness while watching TV at home.  He was admitted for a stroke like symptoms and found to have right corona radiata and caudate body on MRI with no significant findings on MRA.  He also has some bilateral carotid stenosis as well. Patient was also noted to have increased troponin level.  Given his prior history of coronary disease and MI ,cardiology was consulted.  Patient is started on heparin drip and has been planned for cardiac cath tomorrow.   Assessment & Plan:   Principal Problem:   Stroke-like symptoms Active Problems:   Dyslipidemia   Type II diabetes mellitus, well controlled (HCC)   HTN (hypertension), benign   COPD (chronic obstructive pulmonary disease) (HCC)   CAD (coronary artery disease)   Elevated troponin   Claudication (HCC)   CVA (cerebral vascular accident) ([place])  Acute CVA : MRI showed acute stroke involving the right corona radiate and caudate body.  Continue aspirin and statin. Continue physical therapy.  PT recommended home health discharge.  Currently does not have any focal neuro deficits.  Elevated troponin: Started on heparin drip by cardiology.  Plan for cardiac catheterization tomorrow.  Heart failure with reduced ejection fraction: On Toprol and Entresto.  Echocardiogram showed moderate to severely reduced systolic function with ejection fraction of 30 to 35%, moderate left ventricle hypertrophy, grade 2 diastolic dysfunction.  Hyperlipidemia: Continue statin.    Diabetes type 2:Continue  sliding-scale insulin here.  COPD: Continue bronchodilators.  Claudication: Mild peripheral artery disease noted on ABI on left side.  Arterial ABI showed abnormal resting left ankle-brachial index of 0.84 consistent with at least mild peripheral arterial disease.Normal resting right ankle-brachial index of 1.0 .Continue antiplatelets and statin.    DVT prophylaxis: Heparin drip Code Status: Full Family Communication: None present at bedside Disposition Plan: Likely to home with home health after cardiac cath   Consultants: Cardiology, neurology  Procedures: None  Antimicrobials: None  Subjective: Patient seen and examined the bedside this morning.  Remains comfortable.  No new issues/problems.. Objective: Vitals:   04/14/18 1936 04/14/18 2351 04/15/18 0415 04/15/18 1219  BP: 134/68 127/88 128/73 115/78  Pulse: 68 68 71   Resp: 18 18 18 20   Temp: 97.7 F (36.5 C) 97.9 F (36.6 C) 98.7 F (37.1 C) 98.5 F (36.9 C)  TempSrc: Oral Oral Oral Oral  SpO2: 97% 98% 94% 100%  Weight:      Height:        Intake/Output Summary (Last 24 hours) at 04/15/2018 1408 Last data filed at 04/15/2018 0900 Gross per 24 hour  Intake 1169 ml  Output 2150 ml  Net -981 ml   Filed Weights   04/12/18 0257 04/14/18 1749  Weight: 87.5 kg (193 lb) 85.3 kg (188 lb 0.8 oz)    Examination:  General exam: Appears calm and comfortable ,Not in distress,average built HEENT:PERRL,Oral mucosa moist, Ear/Nose normal on gross exam Respiratory system: Bilateral equal air entry, normal vesicular breath sounds, no wheezes or crackles  Cardiovascular system: S1 & S2 heard, RRR. No JVD, murmurs, rubs, gallops or clicks. No pedal edema. Gastrointestinal  system: Abdomen is nondistended, soft and nontender. No organomegaly or masses felt. Normal bowel sounds heard. Central nervous system: Alert and oriented. No focal neurological deficits. Extremities: No edema, no clubbing ,no cyanosis, distal peripheral  pulses palpable. Skin: No rashes, lesions or ulcers,no icterus ,no pallor MSK: Normal muscle bulk,tone ,power Psychiatry: Judgement and insight appear normal. Mood & affect appropriate.     Data Reviewed: I have personally reviewed following labs and imaging studies  CBC: Recent Labs  Lab 04/12/18 0234 04/12/18 0246 04/13/18 0512 04/14/18 0557 04/15/18 1038  WBC 7.2  --  8.3 9.0 8.8  NEUTROABS 3.2  --   --   --   --   HGB 14.8 14.6 14.5 14.1 15.9  HCT 41.9 43.0 42.2 41.3 45.2  MCV 92.1  --  93.2 94.7 93.8  PLT 124*  --  124* 125* 557*   Basic Metabolic Panel: Recent Labs  Lab 04/12/18 0234 04/12/18 0246 04/13/18 0512  NA 133* 135 135  K 4.1 4.1 4.0  CL 98* 99* 102  CO2 25  --  25  GLUCOSE 202* 201* 135*  BUN 15 14 12   CREATININE 1.09 1.10 1.05  CALCIUM 9.6  --  8.7*  MG 1.8  --   --   PHOS 3.1  --   --    GFR: Estimated Creatinine Clearance: 65.7 mL/min (by C-G formula based on SCr of 1.05 mg/dL). Liver Function Tests: Recent Labs  Lab 04/12/18 0234  AST 31  ALT 39  ALKPHOS 79  BILITOT 0.8  PROT 7.8  ALBUMIN 4.1   No results for input(s): LIPASE, AMYLASE in the last 168 hours. No results for input(s): AMMONIA in the last 168 hours. Coagulation Profile: Recent Labs  Lab 04/12/18 0234  INR 1.04   Cardiac Enzymes: Recent Labs  Lab 04/12/18 0234 04/12/18 1006 04/12/18 1405 04/13/18 0512  TROPONINI 0.06* 0.87* 0.86* 0.52*   BNP (last 3 results) No results for input(s): PROBNP in the last 8760 hours. HbA1C: Recent Labs    04/13/18 0512  HGBA1C 7.7*   CBG: Recent Labs  Lab 04/14/18 1559 04/14/18 1801 04/14/18 2125 04/15/18 0626 04/15/18 1227  GLUCAP 176* 133* 136* 141* 125*   Lipid Profile: Recent Labs    04/13/18 0512  CHOL 198  HDL 29*  LDLCALC 139*  TRIG 152*  CHOLHDL 6.8   Thyroid Function Tests: No results for input(s): TSH, T4TOTAL, FREET4, T3FREE, THYROIDAB in the last 72 hours. Anemia Panel: No results for  input(s): VITAMINB12, FOLATE, FERRITIN, TIBC, IRON, RETICCTPCT in the last 72 hours. Sepsis Labs: No results for input(s): PROCALCITON, LATICACIDVEN in the last 168 hours.  No results found for this or any previous visit (from the past 240 hour(s)).       Radiology Studies: No results found.      Scheduled Meds: . aspirin EC  325 mg Oral Daily  . atorvastatin  80 mg Oral q1800  . insulin aspart  0-15 Units Subcutaneous TID WC  . metoprolol succinate  25 mg Oral Daily  . sacubitril-valsartan  1 tablet Oral BID   Continuous Infusions: . heparin 1,100 Units/hr (04/15/18 0449)     LOS: 1 day    Time spent: 35 mins.More than 50% of that time was spent in counseling and/or coordination of care.      Shelly Coss, MD [facility] Pager [phone]  If 7PM-7AM, please contact night-coverage [email or web address] Password [facility] 04/15/2018, 2:08 PM

## 2018-04-15 NOTE — H&P (View-Only) (Signed)
Progress Note  Patient Name: Peter [name] Date of Encounter: 04/15/2018  Primary Cardiologist: No primary care provider on file.   Subjective   77 yo with a history of coronary artery disease-status post coronary artery bypass grafting in 1997.  He had several stent placements over the years.  He presents with symptoms of a stroke.  His troponin levels were found to be elevated.  Was also found to have recent decline in his left ventricular systolic function.  He is scheduled for heart catheterization tomorrow.  His left ventricular ejection fraction is now 30 to 35%.  He has grade 2 diastolic dysfunction.  Inpatient Medications    Scheduled Meds: . aspirin EC  325 mg Oral Daily  . atorvastatin  80 mg Oral q1800  . insulin aspart  0-15 Units Subcutaneous TID WC  . metoprolol succinate  25 mg Oral Daily  . sacubitril-valsartan  1 tablet Oral BID   Continuous Infusions: . heparin 1,100 Units/hr (04/15/18 0449)   PRN Meds: acetaminophen **OR** acetaminophen (TYLENOL) oral liquid 160 mg/5 mL **OR** acetaminophen, ondansetron **OR** ondansetron (ZOFRAN) IV   Vital Signs    Vitals:   04/14/18 1824 04/14/18 1936 04/14/18 2351 04/15/18 0415  BP: 139/71 134/68 127/88 128/73  Pulse: 68 68 68 71  Resp:  18 18 18   Temp: 97.7 F (36.5 C) 97.7 F (36.5 C) 97.9 F (36.6 C) 98.7 F (37.1 C)  TempSrc: Oral Oral Oral Oral  SpO2:  97% 98% 94%  Weight:      Height:        Intake/Output Summary (Last 24 hours) at 04/15/2018 1121 Last data filed at 04/15/2018 0900 Gross per 24 hour  Intake 1485.5 ml  Output 2150 ml  Net -664.5 ml   Filed Weights   04/12/18 0257 04/14/18 1749  Weight: 193 lb (87.5 kg) 188 lb 0.8 oz (85.3 kg)    Telemetry     NSR  - Personally Reviewed  ECG     NSR  - Personally Reviewed  Physical Exam   GEN:  Elderly gentleman, no acute distress. Neck: No JVD Cardiac: RRR, no murmurs, rubs, or gallops.  Respiratory: Clear to auscultation  bilaterally. GI: Soft, nontender, non-distended  MS: No edema; No deformity. Neuro:  Nonfocal  Psych: Normal affect   Labs    Chemistry Recent Labs  Lab 04/12/18 0234 04/12/18 0246 04/13/18 0512  NA 133* 135 135  K 4.1 4.1 4.0  CL 98* 99* 102  CO2 25  --  25  GLUCOSE 202* 201* 135*  BUN 15 14 12   CREATININE 1.09 1.10 1.05  CALCIUM 9.6  --  8.7*  PROT 7.8  --   --   ALBUMIN 4.1  --   --   AST 31  --   --   ALT 39  --   --   ALKPHOS 79  --   --   BILITOT 0.8  --   --   GFRNONAA >60  --  >60  GFRAA >60  --  >60  ANIONGAP 10  --  8     Hematology Recent Labs  Lab 04/13/18 0512 04/14/18 0557 04/15/18 1038  WBC 8.3 9.0 8.8  RBC 4.53 4.36 4.82  HGB 14.5 14.1 15.9  HCT 42.2 41.3 45.2  MCV 93.2 94.7 93.8  MCH 32.0 32.3 33.0  MCHC 34.4 34.1 35.2  RDW 12.9 13.1 13.0  PLT 124* 125* 149*    Cardiac Enzymes Recent Labs  Lab 04/12/18 0234  04/12/18 1006 04/12/18 1405 04/13/18 0512  TROPONINI 0.06* 0.87* 0.86* 0.52*   No results for input(s): TROPIPOC in the last 168 hours.   BNPNo results for input(s): BNP, PROBNP in the last 168 hours.   DDimer No results for input(s): DDIMER in the last 168 hours.   Radiology    No results found.  Cardiac Studies     Patient Profile     77 y.o. male with history of coronary artery disease admitted with a stroke.  He is found to have positive troponin levels and a significant decrease in his left ventricular function.  Assessment & Plan    1.  Coronary artery disease: The patient has had coronary artery bypass grafting as well as several stent procedures.  Because of his enzyme elevation and recent decrease in LV function, will we have scheduled him for cardiac catheterization tomorrow.  We discussed the risks, benefits, options of heart catheter station.  He understands and agrees to proceed.  His creatinine is stable at 1.05.  2.  Chronic combined systolic and diastolic congestive heart failure: Patient was  found to have an ejection fraction of 30 to 35%.  He also presented with positive troponin levels.  He will be scheduled for heart catheterization tomorrow.  For questions or updates, please contact [place] Please consult [email or web address] for contact info under Cardiology/STEMI.      Signed, Mertie Moores, MD  04/15/2018, 11:21 AM

## 2018-04-15 NOTE — Progress Notes (Addendum)
Physical Therapy Treatment Patient Details Name: Peter [name] MRN: [id number] DOB: 1941/10/07 Today's Date: 04/15/2018    History of Present Illness Pt is a 77 y.o. male admitted to [facility] 04/12/18 with c/o LLE weakness and L facial weakness. MRI shows small acute infarct in R corona radiata and caudate body; mild chronic small vessel ischemia. Noted to have L-side mild PAD on ABI and some bilateral carotid stenosis. Transferred to [facility] for cardiac cath (scheduled 5/13). PMH includes AAA, CAD (s/p CABG 1997), PCI, MI, COPD, DMII, HTN.   PT Comments    Pt progressing well with mobility. Able to perform ADLs at sink and amb 250' using RW with supervision for balance. Anxious for cardiac cath scheduled tomorrow. Pt motivated to return home to wife who has dementia; he is her caregiver, but has intermittent assist from children/grandchildren. Feel HHPT services appropriate, especially if pt will have necessary assistance from family. Will follow acutely.   Follow Up Recommendations  Home health PT;Supervision/Assistance - 24 hour     Equipment Recommendations  Rolling walker with 5" wheels    Recommendations for Other Services       Precautions / Restrictions Precautions Precautions: Fall Restrictions Weight Bearing Restrictions: No    Mobility  Bed Mobility               General bed mobility comments: Received standing at sink with RN.   Transfers Overall transfer level: Needs assistance Equipment used: Rolling walker (2 wheeled) Transfers: Sit to/from Stand Sit to Stand: Supervision            Ambulation/Gait Ambulation/Gait assistance: Supervision Ambulation Distance (Feet): 250 Feet Assistive device: Rolling walker (2 wheeled) Gait Pattern/deviations: Step-through pattern;Decreased stride length Gait velocity: Decreased Gait velocity interpretation: <1.8 ft/sec, indicate of risk for recurrent falls General Gait Details: Cues to lengthen stride length and  increased speed while rolling RW, as pt initial taking 2 steps and lifting RW each time; good ability to make adjustments with intermittent cues and supervision for safety   Stairs             Wheelchair Mobility    Modified Rankin (Stroke Patients Only)       Balance Overall balance assessment: Needs assistance Sitting-balance support: Feet supported;No upper extremity supported Sitting balance-Leahy Scale: Good     Standing balance support: During functional activity;Bilateral upper extremity supported Standing balance-Leahy Scale: Fair Standing balance comment: Good ability to perform ADLs standing at sink with no UE support             High level balance activites: Side stepping;Backward walking;Direction changes;Turns High Level Balance Comments: Supervision for safety with higher level balance            Cognition Arousal/Alertness: Awake/alert Behavior During Therapy: WFL for tasks assessed/performed Overall Cognitive Status: Within Functional Limits for tasks assessed                                        Exercises      General Comments        Pertinent Vitals/Pain Pain Assessment: No/denies pain    Home Living                      Prior Function            PT Goals (current goals can now be found in the care plan section) Acute  Rehab PT Goals Patient Stated Goal: return home after rehab PT Goal Formulation: With patient/family Time For Goal Achievement: 04/26/18 Potential to Achieve Goals: Good Progress towards PT goals: Progressing toward goals    Frequency    Min 4X/week      PT Plan Discharge plan needs to be updated    Co-evaluation              AM-PAC PT "6 Clicks" Daily Activity  Outcome Measure  Difficulty turning over in bed (including adjusting bedclothes, sheets and blankets)?: None Difficulty moving from lying on back to sitting on the side of the bed? : None Difficulty sitting  down on and standing up from a chair with arms (e.g., wheelchair, bedside commode, etc,.)?: A Little Help needed moving to and from a bed to chair (including a wheelchair)?: A Little Help needed walking in hospital room?: A Little Help needed climbing 3-5 steps with a railing? : A Lot 6 Click Score: 19    End of Session Equipment Utilized During Treatment: Gait belt Activity Tolerance: Patient tolerated treatment well Patient left: (using toilet, instructed to pull call bell string when done for assistance) Nurse Communication: Mobility status PT Visit Diagnosis: Unsteadiness on feet (R26.81);Other abnormalities of gait and mobility (R26.89);Muscle weakness (generalized) (M62.81)     Time: 2841-3244 PT Time Calculation (min) (ACUTE ONLY): 28 min  Charges:  $Gait Training: 8-22 mins $Therapeutic Activity: 8-22 mins                    G Codes:      Mabeline Caras, PT, DPT Acute Rehab Services  Pager: [email or web address] 04/15/2018, 10:19 AM

## 2018-04-16 ENCOUNTER — Inpatient Hospital Stay (HOSPITAL_COMMUNITY)
Admission: EM | Disposition: A | Discharge status: Home Health | Payer: Self-pay | Source: Home / Self Care | Attending: Internal Medicine

## 2018-04-16 HISTORY — PX: LEFT HEART CATH AND CORS/GRAFTS ANGIOGRAPHY: CATH118250

## 2018-04-16 LAB — CBC
HCT: 43 % (ref 39.0–52.0)
Hemoglobin: 15.2 g/dL (ref 13.0–17.0)
MCH: 33 pg (ref 26.0–34.0)
MCHC: 35.3 g/dL (ref 30.0–36.0)
MCV: 93.3 fL (ref 78.0–100.0)
Platelets: 144 10*3/uL — ABNORMAL LOW (ref 150–400)
RBC: 4.61 MIL/uL (ref 4.22–5.81)
RDW: 13.2 % (ref 11.5–15.5)
WBC: 9.4 10*3/uL (ref 4.0–10.5)

## 2018-04-16 LAB — GLUCOSE, CAPILLARY
Glucose-Capillary: 131 mg/dL — ABNORMAL HIGH (ref 65–99)
Glucose-Capillary: 115 mg/dL — ABNORMAL HIGH (ref 65–99)
Glucose-Capillary: 184 mg/dL — ABNORMAL HIGH (ref 65–99)
Glucose-Capillary: 184 mg/dL — ABNORMAL HIGH (ref 65–99)
Glucose-Capillary: 169 mg/dL — ABNORMAL HIGH (ref 65–99)

## 2018-04-16 LAB — HEPARIN LEVEL (UNFRACTIONATED): Heparin Unfractionated: 0.33 IU/mL (ref 0.30–0.70)

## 2018-04-16 LAB — BASIC METABOLIC PANEL
Anion gap: 10 (ref 5–15)
BUN: 15 mg/dL (ref 6–20)
CO2: 21 mmol/L — ABNORMAL LOW (ref 22–32)
Calcium: 9 mg/dL (ref 8.9–10.3)
Chloride: 105 mmol/L (ref 101–111)
Creatinine, Ser: 1.21 mg/dL (ref 0.61–1.24)
GFR calc Af Amer: >60 mL/min (ref >60)
GFR calc non Af Amer: 56 mL/min — ABNORMAL LOW (ref >60)
Glucose, Bld: 153 mg/dL — ABNORMAL HIGH (ref 65–99)
Potassium: 4 mmol/L (ref 3.5–5.1)
Sodium: 136 mmol/L (ref 135–145)

## 2018-04-16 LAB — POCT ACTIVATED CLOTTING TIME: Activated Clotting Time: 125 seconds

## 2018-04-16 LAB — PROTIME-INR
INR: 1.12
Prothrombin Time: 14.4 seconds (ref 11.4–15.2)

## 2018-04-16 SURGERY — LEFT HEART CATH AND CORS/GRAFTS ANGIOGRAPHY
Anesthesia: LOCAL

## 2018-04-16 MED ORDER — HEPARIN (PORCINE) IN NACL 1000-0.9 UT/500ML-% IV SOLN
INTRAVENOUS | Status: AC
  Filled 2018-04-16: qty 1000

## 2018-04-16 MED ORDER — ASPIRIN 81 MG PO CHEW
81.0000 mg | CHEWABLE_TABLET | ORAL | Status: DC
  Filled 2018-04-16: qty 1

## 2018-04-16 MED ORDER — HEPARIN (PORCINE) IN NACL 2-0.9 UNITS/ML
INTRAMUSCULAR | Status: AC | PRN
  Administered 2018-04-16 (×2): 500 mL

## 2018-04-16 MED ORDER — MIDAZOLAM HCL 2 MG/2ML IJ SOLN
INTRAMUSCULAR | Status: DC | PRN
  Administered 2018-04-16: 1 mg via INTRAVENOUS

## 2018-04-16 MED ORDER — IOHEXOL 350 MG/ML SOLN
INTRAVENOUS | Status: DC | PRN
  Administered 2018-04-16: 80 mL via INTRA_ARTERIAL

## 2018-04-16 MED ORDER — DIPHENHYDRAMINE HCL 50 MG/ML IJ SOLN: 50.0000 mg | Freq: Once | INTRAMUSCULAR | Status: DC

## 2018-04-16 MED ORDER — LIDOCAINE HCL (PF) 1 % IJ SOLN
INTRAMUSCULAR | Status: DC | PRN
  Administered 2018-04-16: 15 mL

## 2018-04-16 MED ORDER — SODIUM CHLORIDE 0.9 % WEIGHT BASED INFUSION: 3.0000 mL/kg/h | INTRAVENOUS | Status: DC

## 2018-04-16 MED ORDER — MIDAZOLAM HCL 2 MG/2ML IJ SOLN
INTRAMUSCULAR | Status: AC
  Filled 2018-04-16: qty 2

## 2018-04-16 MED ORDER — SODIUM CHLORIDE 0.9% FLUSH
3.0000 mL | INTRAVENOUS | Status: DC | PRN
Start: 2018-04-17 — End: 2018-04-17

## 2018-04-16 MED ORDER — DIPHENHYDRAMINE HCL 25 MG PO CAPS: 50.0000 mg | ORAL_CAPSULE | Freq: Once | ORAL | Status: DC

## 2018-04-16 MED ORDER — HEPARIN SODIUM (PORCINE) 5000 UNIT/ML IJ SOLN
5000.0000 [IU] | Freq: Three times a day (TID) | INTRAMUSCULAR | Status: DC
  Administered 2018-04-17: 06:00:00 5000 [IU] via SUBCUTANEOUS
  Filled 2018-04-16: qty 1

## 2018-04-16 MED ORDER — SODIUM CHLORIDE 0.9 % IV SOLN: 250.0000 mL | INTRAVENOUS | Status: DC | PRN

## 2018-04-16 MED ORDER — SODIUM CHLORIDE 0.9 % WEIGHT BASED INFUSION: 1.0000 mL/kg/h | INTRAVENOUS | Status: DC

## 2018-04-16 MED ORDER — SODIUM CHLORIDE 0.9% FLUSH
3.0000 mL | Freq: Two times a day (BID) | INTRAVENOUS | Status: DC
  Administered 2018-04-16 – 2018-04-17 (×2): 3 mL via INTRAVENOUS

## 2018-04-16 MED ORDER — FENTANYL CITRATE (PF) 100 MCG/2ML IJ SOLN
INTRAMUSCULAR | Status: DC | PRN
  Administered 2018-04-16: 25 ug via INTRAVENOUS

## 2018-04-16 MED ORDER — SODIUM CHLORIDE 0.9 % WEIGHT BASED INFUSION: 1.0000 mL/kg/h | INTRAVENOUS | Status: AC

## 2018-04-16 MED ORDER — METHYLPREDNISOLONE SODIUM SUCC 40 MG IJ SOLR
40.0000 mg | INTRAMUSCULAR | Status: DC
  Administered 2018-04-16 (×2): 40 mg via INTRAVENOUS
  Filled 2018-04-16 (×2): qty 1

## 2018-04-16 MED ORDER — LIDOCAINE HCL (PF) 1 % IJ SOLN
INTRAMUSCULAR | Status: AC
  Filled 2018-04-16: qty 30

## 2018-04-16 MED ORDER — FENTANYL CITRATE (PF) 100 MCG/2ML IJ SOLN
INTRAMUSCULAR | Status: AC
  Filled 2018-04-16: qty 2

## 2018-04-16 SURGICAL SUPPLY — 11 items
CATH INFINITI 5 FR IM (CATHETERS) ×2 IMPLANT
CATH INFINITI 5FR JL4 (CATHETERS) ×2 IMPLANT
CATH INFINITI JR4 5F (CATHETERS) ×2 IMPLANT
COVER PRB 48X5XTLSCP FOLD TPE (BAG) ×1 IMPLANT
COVER PROBE 5X48 (BAG) ×1
KIT HEART LEFT (KITS) ×2 IMPLANT
PACK CARDIAC CATHETERIZATION (CUSTOM PROCEDURE TRAY) ×2 IMPLANT
SHEATH PINNACLE 5F 10CM (SHEATH) ×2 IMPLANT
TRANSDUCER W/STOPCOCK (MISCELLANEOUS) ×2 IMPLANT
TUBING CIL FLEX 10 FLL-RA (TUBING) ×2 IMPLANT
WIRE EMERALD 3MM-J .035X150CM (WIRE) ×2 IMPLANT

## 2018-04-16 NOTE — Progress Notes (Signed)
Pt remained  NPO for Cath lab , Pre cath orders followed concert in chart  No c/o son at bed side.  IV infusing as per order. Belongings packed and at bedside with pt.

## 2018-04-16 NOTE — Progress Notes (Signed)
PROGRESS NOTE    Peter [name]  GUR:[id number] DOB: 06-16-1941 DOA: 04/12/2018 PCP: Kathyrn Drown, MD   Brief Narrative: Patient is a 77 year old male with past medical history significant for abdominal aortic invasion, coronary artery disease status post CABG in 1997 with multiple PCI's history of MI, COPD, type 2 diabetes, dyslipidemia, hypertension who presented to the emergency department with complaints of right lower extremity weakness with left facial weakness while watching TV at home.  He was admitted for a stroke like symptoms and found to have right corona radiata and caudate body on MRI with no significant findings on MRA.  He also has some bilateral carotid stenosis as well. Patient was also noted to have increased troponin level.  Given his prior history of coronary disease and MI ,cardiology was consulted.  Patient is started on heparin drip and has been planned for cardiac cath today.   Assessment & Plan:   Principal Problem:   Stroke-like symptoms Active Problems:   Dyslipidemia   Type II diabetes mellitus, well controlled (HCC)   HTN (hypertension), benign   COPD (chronic obstructive pulmonary disease) (HCC)   CAD (coronary artery disease)   Elevated troponin   Claudication (HCC)   CVA (cerebral vascular accident) ([place])  Acute CVA : MRI showed acute stroke involving the right corona radiate and caudate body.  Continue aspirin and statin. Continue physical therapy.  PT recommended home health discharge.  Currently does not have any focal neuro deficits.  Elevated troponin: Started on heparin drip by cardiology.  Plan for cardiac catheterization today.  Heart failure with reduced ejection fraction: On Toprol and Entresto.  Echocardiogram showed moderate to severely reduced systolic function with ejection fraction of 30 to 35%, moderate left ventricle hypertrophy, grade 2 diastolic dysfunction.  Hyperlipidemia: Continue statin.    Diabetes type 2:Continue  sliding-scale insulin here.  COPD: Continue bronchodilators.  Claudication: Mild peripheral artery disease noted on ABI on left side.  Arterial ABI showed abnormal resting left ankle-brachial index of 0.84 consistent with at least mild peripheral arterial disease.Normal resting right ankle-brachial index of 1.0 .Continue antiplatelets and statin.    DVT prophylaxis: Heparin drip Code Status: Full Family Communication: None present at bedside Disposition Plan: Likely to home with home health after cardiac cath   Consultants: Cardiology, neurology  Procedures: None  Antimicrobials: None  Subjective: Patient seen and examined the bedside this morning.  Remains comfortable.  Complains of funny sensation around the skin and was asking if it is due to heparin.Waiting for cath today.   Objective: Vitals:   04/15/18 1954 04/15/18 2319 04/16/18 0515 04/16/18 0756  BP: (!) 143/73 (!) 162/87 139/88 121/90  Pulse: 68 75 63 84  Resp: 18 18 18 17   Temp: 97.7 F (36.5 C) 97.9 F (36.6 C) 98.1 F (36.7 C) 97.9 F (36.6 C)  TempSrc: Oral Oral Oral Oral  SpO2: 100% 99% 96% 96%  Weight:   83.2 kg (183 lb 6.8 oz)   Height:        Intake/Output Summary (Last 24 hours) at 04/16/2018 1140 Last data filed at 04/16/2018 0744 Gross per 24 hour  Intake 569.83 ml  Output 1500 ml  Net -930.17 ml   Filed Weights   04/12/18 0257 04/14/18 1749 04/16/18 0515  Weight: 87.5 kg (193 lb) 85.3 kg (188 lb 0.8 oz) 83.2 kg (183 lb 6.8 oz)    Examination:  General exam: Appears calm and comfortable ,Not in distress,average built HEENT:PERRL,Oral mucosa moist, Ear/Nose normal on gross exam  Respiratory system: Bilateral equal air entry, normal vesicular breath sounds, no wheezes or crackles  Cardiovascular system: S1 & S2 heard, RRR. No JVD, murmurs, rubs, gallops or clicks. Gastrointestinal system: Abdomen is nondistended, soft and nontender. No organomegaly or masses felt. Normal bowel sounds  heard. Central nervous system: Alert and oriented. No focal neurological deficits. Extremities: No edema, no clubbing ,no cyanosis, distal peripheral pulses palpable. Skin: No rashes, lesions or ulcers,no icterus ,no pallor MSK: Normal muscle bulk,tone ,power Psychiatry: Judgement and insight appear normal. Mood & affect appropriate.      Data Reviewed: I have personally reviewed following labs and imaging studies  CBC: Recent Labs  Lab 04/12/18 0234 04/12/18 0246 04/13/18 0512 04/14/18 0557 04/15/18 1038 04/16/18 0756  WBC 7.2  --  8.3 9.0 8.8 9.4  NEUTROABS 3.2  --   --   --   --   --   HGB 14.8 14.6 14.5 14.1 15.9 15.2  HCT 41.9 43.0 42.2 41.3 45.2 43.0  MCV 92.1  --  93.2 94.7 93.8 93.3  PLT 124*  --  124* 125* 149* 237*   Basic Metabolic Panel: Recent Labs  Lab 04/12/18 0234 04/12/18 0246 04/13/18 0512 04/16/18 0756  NA 133* 135 135 136  K 4.1 4.1 4.0 4.0  CL 98* 99* 102 105  CO2 25  --  25 21*  GLUCOSE 202* 201* 135* 153*  BUN 15 14 12 15   CREATININE 1.09 1.10 1.05 1.21  CALCIUM 9.6  --  8.7* 9.0  MG 1.8  --   --   --   PHOS 3.1  --   --   --    GFR: Estimated Creatinine Clearance: 57 mL/min (by C-G formula based on SCr of 1.21 mg/dL). Liver Function Tests: Recent Labs  Lab 04/12/18 0234  AST 31  ALT 39  ALKPHOS 79  BILITOT 0.8  PROT 7.8  ALBUMIN 4.1   No results for input(s): LIPASE, AMYLASE in the last 168 hours. No results for input(s): AMMONIA in the last 168 hours. Coagulation Profile: Recent Labs  Lab 04/12/18 0234 04/16/18 0756  INR 1.04 1.12   Cardiac Enzymes: Recent Labs  Lab 04/12/18 0234 04/12/18 1006 04/12/18 1405 04/13/18 0512  TROPONINI 0.06* 0.87* 0.86* 0.52*   BNP (last 3 results) No results for input(s): PROBNP in the last 8760 hours. HbA1C: No results for input(s): HGBA1C in the last 72 hours. CBG: Recent Labs  Lab 04/15/18 1227 04/15/18 1643 04/15/18 2152 04/16/18 0519 04/16/18 1106  GLUCAP 125* 138*  110* 131* 115*   Lipid Profile: No results for input(s): CHOL, HDL, LDLCALC, TRIG, CHOLHDL, LDLDIRECT in the last 72 hours. Thyroid Function Tests: No results for input(s): TSH, T4TOTAL, FREET4, T3FREE, THYROIDAB in the last 72 hours. Anemia Panel: No results for input(s): VITAMINB12, FOLATE, FERRITIN, TIBC, IRON, RETICCTPCT in the last 72 hours. Sepsis Labs: No results for input(s): PROCALCITON, LATICACIDVEN in the last 168 hours.  No results found for this or any previous visit (from the past 240 hour(s)).       Radiology Studies: No results found.      Scheduled Meds: . [START ON 04/17/2018] aspirin  81 mg Oral Pre-Cath  . aspirin EC  325 mg Oral Daily  . atorvastatin  80 mg Oral q1800  . diphenhydrAMINE  50 mg Oral Once   Or  . diphenhydrAMINE  50 mg Intravenous Once  . insulin aspart  0-15 Units Subcutaneous TID WC  . methylPREDNISolone (SOLU-MEDROL) injection  40 mg Intravenous  Q4H  . metoprolol succinate  25 mg Oral Daily  . sacubitril-valsartan  1 tablet Oral BID  . sodium chloride flush  3 mL Intravenous Q12H   Continuous Infusions: . sodium chloride 250 mL (04/15/18 2213)  . sodium chloride 1 mL/kg/hr (04/16/18 0744)  . sodium chloride    . heparin 1,100 Units/hr (04/16/18 0334)     LOS: 2 days    Time spent: 25 mins.More than 50% of that time was spent in counseling and/or coordination of care.      Shelly Coss, MD [facility] Pager [phone]  If 7PM-7AM, please contact night-coverage [email or web address] Password [email or web address] 04/16/2018, 11:40 AM

## 2018-04-16 NOTE — Progress Notes (Addendum)
Site area: RFA Site Prior to Removal:  Level 0 Pressure Applied For: Manual:   yes Patient Status During Pull:  stable Post Pull Site:  Level 0 Post Pull Instructions Given:   Post Pull Pulses Present: palpable Dressing Applied:  Clear/gauze Bedrest begins @ 8337 till 2015 Comments:

## 2018-04-16 NOTE — Care Management Note (Signed)
Case Management Note  Patient Details  Name: Peter [name] MRN: [id number] Date of Birth: 07/31/1941  Subjective/Objective:  From home, s/p heart cath, NCM spoke with patient about HHPT, he states to call him tomorrow.  NCM will speak with patient tomorrow about [facility] services.                    Action/Plan: NCM will follow for dc needs.  Expected Discharge Date:                  Expected Discharge Plan:  [place]  In-House Referral:     Discharge planning Services  CM Consult  Post Acute Care Choice:    Choice offered to:  Patient  DME Arranged:    DME Agency:     HH Arranged:    [place] Agency:     Status of Service:  In process, will continue to follow  If discussed at Long Length of Stay Meetings, dates discussed:    Additional Comments:  [name], RN 04/16/2018, 5:03 PM

## 2018-04-16 NOTE — Progress Notes (Signed)
ANTICOAGULATION CONSULT NOTE  Pharmacy Consult:  Heparin Indication: chest pain/ACS  Patient Measurements: Height: 6' (182.9 cm) Weight: 183 lb 6.8 oz (83.2 kg) IBW/kg (Calculated) : 77.6 HEPARIN DW (KG): 85.3   Vital Signs: Temp: 97.9 F (36.6 C) (05/13 0756) Temp Source: Oral (05/13 0756) BP: 121/90 (05/13 0756) Pulse Rate: 84 (05/13 0756)  Labs: Recent Labs    04/14/18 0557 04/15/18 1038 04/16/18 0756  HGB 14.1 15.9 15.2  HCT 41.3 45.2 43.0  PLT 125* 149* 144*  LABPROT  --   --  14.4  INR  --   --  1.12  HEPARINUNFRC 0.51 0.38 0.33  CREATININE  --   --  1.21   Estimated Creatinine Clearance: 57 mL/min (by C-G formula based on SCr of 1.21 mg/dL).   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 The patient also had a bump in troponins concerning for ACS; therefore, Pharmacy consulted to start Heparin for anticoagulation.   Heparin level is therapeutic; no bleeding reported.   Goal of Therapy:  Heparin level 0.3-0.5 units/ml Monitor platelets by anticoagulation protocol: Yes    Plan:  Continue heparin gtt at 1100 units/hr Daily heparin level and CBC F/U post cath   Bradlee Heitman D. Mina Marble, PharmD, BCPS, BCCCP Pager:  [phone] 04/16/2018, 10:28 AM

## 2018-04-16 NOTE — Interval H&P Note (Signed)
History and Physical Interval Note:  04/16/2018 2:19 PM  Peter [name]  has presented today for surgery, with the diagnosis of NSTEMI  The various methods of treatment have been discussed with the patient and family. After consideration of risks, benefits and other options for treatment, the patient has consented to  Procedure(s): LEFT HEART CATH AND CORS/GRAFTS ANGIOGRAPHY (N/A) as a surgical intervention .  The patient's history has been reviewed, patient examined, no change in status, stable for surgery.  I have reviewed the patient's chart and labs.  Questions were answered to the patient's satisfaction.    Cath Lab Visit (complete for each Cath Lab visit)  Clinical Evaluation Leading to the Procedure:   ACS: No.  Non-ACS:    Anginal Classification: CCS I  Anti-ischemic medical therapy: Minimal Therapy (1 class of medications)  Non-Invasive Test Results: No non-invasive testing performed  Prior CABG: Previous CABG       Collier [facility] 04/16/2018 2:19 PM

## 2018-04-17 ENCOUNTER — Telehealth: Payer: Self-pay | Admitting: Family Medicine

## 2018-04-17 ENCOUNTER — Encounter (HOSPITAL_COMMUNITY): Payer: Self-pay | Admitting: Cardiology

## 2018-04-17 LAB — COMPREHENSIVE METABOLIC PANEL
ALT: 62 U/L (ref 17–63)
AST: 55 U/L — ABNORMAL HIGH (ref 15–41)
Albumin: 3.5 g/dL (ref 3.5–5.0)
Alkaline Phosphatase: 71 U/L (ref 38–126)
Anion gap: 11 (ref 5–15)
BUN: 24 mg/dL — ABNORMAL HIGH (ref 6–20)
CO2: 21 mmol/L — ABNORMAL LOW (ref 22–32)
Calcium: 9 mg/dL (ref 8.9–10.3)
Chloride: 103 mmol/L (ref 101–111)
Creatinine, Ser: 1.24 mg/dL (ref 0.61–1.24)
GFR calc Af Amer: >60 mL/min (ref >60)
GFR calc non Af Amer: 55 mL/min — ABNORMAL LOW (ref >60)
Glucose, Bld: 195 mg/dL — ABNORMAL HIGH (ref 65–99)
Potassium: 4.1 mmol/L (ref 3.5–5.1)
Sodium: 135 mmol/L (ref 135–145)
Total Bilirubin: 0.9 mg/dL (ref 0.3–1.2)
Total Protein: 7 g/dL (ref 6.5–8.1)

## 2018-04-17 LAB — CBC
HCT: 40.5 % (ref 39.0–52.0)
Hemoglobin: 14.1 g/dL (ref 13.0–17.0)
MCH: 32.2 pg (ref 26.0–34.0)
MCHC: 34.8 g/dL (ref 30.0–36.0)
MCV: 92.5 fL (ref 78.0–100.0)
Platelets: 126 10*3/uL — ABNORMAL LOW (ref 150–400)
RBC: 4.38 MIL/uL (ref 4.22–5.81)
RDW: 13 % (ref 11.5–15.5)
WBC: 8.5 10*3/uL (ref 4.0–10.5)

## 2018-04-17 LAB — GLUCOSE, CAPILLARY
Glucose-Capillary: 244 mg/dL — ABNORMAL HIGH (ref 65–99)
Glucose-Capillary: 151 mg/dL — ABNORMAL HIGH (ref 65–99)

## 2018-04-17 MED ORDER — METOPROLOL SUCCINATE ER 25 MG PO TB24: 25.0000 mg | ORAL_TABLET | Freq: Every day | ORAL | 0 refills | Status: DC

## 2018-04-17 MED ORDER — PANTOPRAZOLE SODIUM 40 MG PO TBEC
40.0000 mg | DELAYED_RELEASE_TABLET | Freq: Every day | ORAL | Status: DC
  Administered 2018-04-17: 40 mg via ORAL
  Filled 2018-04-17: qty 1

## 2018-04-17 MED ORDER — ASPIRIN 325 MG PO TBEC: 325.0000 mg | DELAYED_RELEASE_TABLET | Freq: Every day | ORAL | 0 refills | Status: DC

## 2018-04-17 MED ORDER — ALUM & MAG HYDROXIDE-SIMETH 200-200-20 MG/5ML PO SUSP
30.0000 mL | Freq: Four times a day (QID) | ORAL | Status: DC | PRN
  Administered 2018-04-17: 30 mL via ORAL
  Filled 2018-04-17: qty 30

## 2018-04-17 MED ORDER — ATORVASTATIN CALCIUM 80 MG PO TABS: 80.0000 mg | ORAL_TABLET | Freq: Every day | ORAL | 0 refills | Status: DC

## 2018-04-17 MED ORDER — PANTOPRAZOLE SODIUM 40 MG PO TBEC: 40.0000 mg | DELAYED_RELEASE_TABLET | Freq: Every day | ORAL | 0 refills | Status: DC

## 2018-04-17 MED ORDER — SACUBITRIL-VALSARTAN 24-26 MG PO TABS: 1.0000 | ORAL_TABLET | Freq: Two times a day (BID) | ORAL | 0 refills | Status: DC

## 2018-04-17 NOTE — Care Management Note (Signed)
Case Management Note  Patient Details  Name: Peter [name] MRN: [id number] Date of Birth: 1941/04/06  Subjective/Objective:   From home, s/p heart cath, NCM spoke with patient about HHPT, he states to call him tomorrow.  NCM will speak with patient tomorrow about [facility] services.  5/14- NCM spoke with patient and he said he would like to work with Alvis Lemmings for [place], referral given to [place] with [place].  Soc will begin 24-48 hrs post dc.                   Action/Plan: DC home with HHPT with [facility].  Expected Discharge Date:                  Expected Discharge Plan:  [place]  In-House Referral:     Discharge planning Services  CM Consult  Post Acute Care Choice:  Home Health Choice offered to:  Patient  DME Arranged:    DME Agency:     HH Arranged:  PT HH Agency:  [place]  Status of Service:  Completed, signed off  If discussed at [place] of Stay Meetings, dates discussed:    Additional Comments:  [name], RN 04/17/2018, 9:23 AM

## 2018-04-17 NOTE — Progress Notes (Signed)
Physical Therapy Treatment Patient Details Name: Peter [name] MRN: [id number] DOB: [birth year] Today's Date: 04/17/2018    History of Present Illness Pt is a 77 y.o. male admitted to [facility] 04/12/18 with c/o LLE weakness and L facial weakness. MRI shows small acute infarct in R corona radiata and caudate body; mild chronic small vessel ischemia. Noted to have L-side mild PAD on ABI and some bilateral carotid stenosis. Transferred to [facility] for cardiac cath; s/p cath 5/13. PMH includes AAA, CAD (s/p CABG 1997), PCI, MI, COPD, DMII, HTN.   PT Comments    Pt progressing well with mobility. Able to transfer and ambulate with RW and supervision for balance. Educ on fall risk reduction, DME use, and importance of continued mobility. Plans to discharge home today with [facility] services.    Follow Up Recommendations  Home health PT;Supervision/Assistance - 24 hour     Equipment Recommendations  Rolling walker with 5" wheels    Recommendations for Other Services       Precautions / Restrictions Precautions Precautions: Fall Restrictions Weight Bearing Restrictions: No    Mobility  Bed Mobility Overal bed mobility: Modified Independent Bed Mobility: Supine to Sit           General bed mobility comments: Increased time; no physical assist required  Transfers Overall transfer level: Needs assistance Equipment used: Rolling walker (2 wheeled) Transfers: Sit to/from Stand Sit to Stand: Supervision         General transfer comment: Increased time and effort; stood with and without RW, supervision for safety. Educ on keeping RW close to bed/chair at all times  Ambulation/Gait Ambulation/Gait assistance: Supervision Ambulation Distance (Feet): 300 Feet Assistive device: Rolling walker (2 wheeled) Gait Pattern/deviations: Step-through pattern;Decreased stride length Gait velocity: Decreased Gait velocity interpretation: <1.8 ft/sec, indicate of risk for recurrent falls General  Gait Details: Improved stride length with ambulation using RW; supervision for safety. Intermittent standing rest breaks secondary to c/o "legs feel weak"; no overt instability noted   Stairs             Wheelchair Mobility    Modified Rankin (Stroke Patients Only)       Balance Overall balance assessment: Needs assistance Sitting-balance support: Feet supported;No upper extremity supported Sitting balance-Leahy Scale: Good     Standing balance support: During functional activity;Bilateral upper extremity supported Standing balance-Leahy Scale: Fair                              Cognition Arousal/Alertness: Awake/alert Behavior During Therapy: WFL for tasks assessed/performed Overall Cognitive Status: Within Functional Limits for tasks assessed                                        Exercises      General Comments General comments (skin integrity, edema, etc.): Son present during session      Pertinent Vitals/Pain Pain Assessment: No/denies pain    Home Living                      Prior Function            PT Goals (current goals can now be found in the care plan section) Acute Rehab PT Goals Patient Stated Goal: Return home PT Goal Formulation: With patient Time For Goal Achievement: 04/26/18 Potential to Achieve Goals: Good Progress towards  PT goals: Progressing toward goals    Frequency    Min 4X/week      PT Plan Current plan remains appropriate    Co-evaluation              AM-PAC PT "6 Clicks" Daily Activity  Outcome Measure  Difficulty turning over in bed (including adjusting bedclothes, sheets and blankets)?: None Difficulty moving from lying on back to sitting on the side of the bed? : None Difficulty sitting down on and standing up from a chair with arms (e.g., wheelchair, bedside commode, etc,.)?: A Little Help needed moving to and from a bed to chair (including a wheelchair)?: A  Little Help needed walking in hospital room?: A Little Help needed climbing 3-5 steps with a railing? : A Little 6 Click Score: 20    End of Session Equipment Utilized During Treatment: Gait belt Activity Tolerance: Patient tolerated treatment well Patient left: with call bell/phone within reach;with family/visitor present(standing at sink with son) Nurse Communication: Mobility status PT Visit Diagnosis: Unsteadiness on feet (R26.81);Other abnormalities of gait and mobility (R26.89);Muscle weakness (generalized) (M62.81)     Time: 1478-2956 PT Time Calculation (min) (ACUTE ONLY): 24 min  Charges:  $Gait Training: 8-22 mins $Therapeutic Activity: 8-22 mins                    G Codes:      Mabeline Caras, PT, DPT Acute Rehab Services  Pager: [phone] 04/17/2018, 1:35 PM

## 2018-04-17 NOTE — Discharge Summary (Signed)
Physician Discharge Summary  Peter [name] GLO:[id number] DOB: 21-Dec-1940 DOA: 04/12/2018  PCP: Kathyrn Drown, MD  Admit date: 04/12/2018 Discharge date: 04/17/2018  Admitted From: Home Disposition:  Home  Discharge Condition:Stable CODE STATUS:FULL Diet recommendation: Heart Healthy  Brief/Interim Summary: Patient is a 77 year old male with past medical history significant for abdominal aortic invasion, coronary artery disease status post CABG in 1997 with multiple PCI's history of MI, COPD, type 2 diabetes, dyslipidemia, hypertension who presented to the emergency department with complaints of right lower extremity weakness with left facial weakness while watching TV at home.  He was admitted for a stroke like symptoms and found to have right corona radiata and caudate body on MRI with no significant findings on MRA.  He also has some bilateral carotid stenosis as well. Patient was also noted to have increased troponin level.  Given his prior history of coronary disease and MI ,cardiology was consulted.  Patient was started on heparin drip and underwent cardiac cath on 04/16/18. Patient was planned to treat medically .Patient was medically optimized.  Currently he is overall status is stable.  He will follow-up with cardiology and neurology as outpatient.   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  Continue aspirin and statin. Continue physical therapy.  PT recommended home health discharge. Currently does not have any focal neuro deficits.  Elevated troponin: Started on heparin drip by cardiology. Underwent catheterization which revealed patent LIMA to LAD and SVG to ramus intermediate, occluded SVG to PDA, and occluded diagonal. Planned for medical management.  Medicines adjusted by cardiology.  Heart failure with reduced ejection fraction: On Toprol and Entresto.   Echocardiogram showed moderate to severely reduced systolic function with ejection fraction of 30 to 35%, moderate left ventricle hypertrophy, grade 2 diastolic dysfunction.  Hyperlipidemia: Continue statin.    Diabetes type 2:Continue home regimen.  COPD: Stable.  Claudication: Mild peripheral artery disease noted on ABI on left side.  Arterial ABI showed abnormal resting left ankle-brachial index of 0.84 consistent with at least mild peripheral arterial disease.Normal resting right ankle-brachial index of 1.0 .Continue antiplatelets and statin.  RUQ pain: Stable now.  Liver enzymes normal.  Follow-up with PCP as an outpatient for further evaluation if needed.  Protonix added on home medications for reflux symptoms.     Discharge Diagnoses:  Principal Problem:   Stroke-like symptoms Active Problems:   Dyslipidemia   Type II diabetes mellitus, well controlled (HCC)   HTN (hypertension), benign   COPD (chronic obstructive pulmonary disease) (HCC)   CAD (coronary artery disease)   Elevated troponin   Claudication (HCC)   CVA (cerebral vascular accident) [facility])    Discharge Instructions  Discharge Instructions    Diet - low sodium heart healthy   Complete by:  As directed    Discharge instructions   Complete by:  As directed    1) Follow up with PMD as an outpatient in a week.Marland Kitchen 2) Take prescribed medications as instructed. 3)Follow up with cardiology and neurology as an outpatient.  Name and number of the providers have been attached.   Increase activity slowly   Complete by:  As directed      Allergies as of 04/17/2018      Reactions   Dye Fdc Red [red Dye]    hives   Ivp Dye [iodinated Diagnostic Agents] Hives   Keflex [cephalexin]    hoarsness   Plavix [  clopidogrel] Hives   Unsure if dye or Plavix      Medication List    STOP taking these medications   atenolol 100 MG tablet Commonly known as:  TENORMIN   ibuprofen 200 MG tablet Commonly known as:   ADVIL,MOTRIN   lisinopril 10 MG tablet Commonly known as:  PRINIVIL,ZESTRIL     TAKE these medications   aspirin 325 MG EC tablet Take 1 tablet (325 mg total) by mouth daily. Start taking on:  04/18/2018   atorvastatin 80 MG tablet Commonly known as:  LIPITOR Take 1 tablet (80 mg total) by mouth daily at 6 PM.   chlorzoxazone 500 MG tablet Commonly known as:  PARAFON Take 1 tablet (500 mg total) by mouth 3 (three) times daily as needed for muscle spasms.   HYDROcodone-acetaminophen 10-325 MG tablet Commonly known as:  NORCO 1 bid prn pain What changed:    how much to take  how to take this  when to take this  additional instructions   ketoconazole 2 % cream Commonly known as:  NIZORAL Apply 1 application topically 2 (two) times daily. APPLY BID TO RASH IN GROIN AREA   metoprolol succinate 25 MG 24 hr tablet Commonly known as:  TOPROL-XL Take 1 tablet (25 mg total) by mouth daily. Start taking on:  04/18/2018   nitroGLYCERIN 0.4 MG SL tablet Commonly known as:  NITROSTAT Place 1 tablet (0.4 mg total) under the tongue every 5 (five) minutes as needed for chest pain.   pantoprazole 40 MG tablet Commonly known as:  PROTONIX Take 1 tablet (40 mg total) by mouth daily.   sacubitril-valsartan 24-26 MG Commonly known as:  ENTRESTO Take 1 tablet by mouth 2 (two) times daily.   traMADol 50 MG tablet Commonly known as:  ULTRAM 1 bid prn            Durable Medical Equipment  (From admission, onward)        Start     Ordered   04/17/18 1056  For home use only DME Walker rolling  Once    Question:  Patient needs a walker to treat with the following condition  Answer:  Weakness   04/17/18 1056     Follow-up Information    [facility] Follow up.   Specialty:  Home Health Services Why:  HHPT Contact information: [place] [address] [phone]        Erma Heritage, PA-C Follow up on 05/09/2018.    Specialties:  Physician Assistant, Cardiology Why:  Please arrive 15 minutes early for your 2:30pm appointment Contact information: [place] [address] [place] Follow up.   Why:  rolling walker Contact information: [place] [address] [phone]        Kathyrn Drown, MD. Schedule an appointment as soon as possible for a visit in 1 week(s).   Specialty:  Family Medicine Contact information: [place] [address] [phone]        Arnoldo Lenis, MD .   Specialty:  Cardiology Contact information: [address] [place] [address] [phone]        [facility]. Schedule an appointment as soon as possible for a visit in 2 week(s).   Specialty:  Neurology Contact information: [address] [place] [phone]  Allergies  Allergen Reactions  . Dye Fdc Red [Red Dye]     hives  . Ivp Dye [Iodinated Diagnostic Agents] Hives  . Keflex [Cephalexin]     hoarsness  . Plavix [Clopidogrel] Hives    Unsure if dye or Plavix    Consultations: Cardiology  Procedures/Studies: Ct Angio Head W Or Wo Contrast  Result Date: 04/12/2018 CLINICAL DATA:  Initial evaluation for acute left-sided weakness. EXAM: CT ANGIOGRAPHY HEAD AND NECK TECHNIQUE: Multidetector CT imaging of the head and neck was performed using the standard protocol during bolus administration of intravenous contrast. Multiplanar CT image reconstructions and MIPs were obtained to evaluate the vascular anatomy. Carotid stenosis measurements (when applicable) are obtained utilizing NASCET criteria, using the distal internal carotid diameter as the denominator. CONTRAST:  178mL ISOVUE-370 IOPAMIDOL (ISOVUE-370) INJECTION 76% COMPARISON:  Prior head CT from earlier same day. FINDINGS: CTA NECK FINDINGS Aortic arch: Visualized arch  of normal caliber with normal branch pattern. Atherosclerotic change about the arch and origin of the great vessels without significant stenosis. Visualized subclavian arteries patent. Right carotid system: Right common carotid artery patent to the bifurcation without stenosis. Mixed plaque about the proximal right ICA with associated narrowing of up to 40% by NASCET criteria. Right ICA patent distally to the skull base. Left carotid system: Left common carotid artery patent to the bifurcation without significant stenosis. Atheromatous plaque about the proximal left ICA with associated mild narrowing of no more than 30% by NASCET criteria. Left ICA widely patent distally to the skull base. Vertebral arteries: Both of the vertebral arteries arise from the subclavian arteries. Atheromatous plaque at the origin of the vertebral arteries without hemodynamically significant stenosis. Vertebral arteries otherwise widely patent within the neck without stenosis, dissection, or occlusion. Skeleton: No acute osseous abnormality. 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 No worrisome lytic or blastic osseous lesions. Median sternotomy wires noted. Other neck: No acute soft tissue abnormality within the neck. Salivary and thyroid glands are normal. No adenopathy. Upper chest: Visualized upper mediastinum within normal limits. Emphysema noted. Superimposed diffuse ground-glass opacity with interlobular septal thickening, most suggestive of edema. Review of the MIP images confirms the above findings CTA HEAD FINDINGS Anterior circulation: Petrous segments widely patent. Scattered atheromatous plaque within the cavernous/supraclinoid ICAs. Mild-to-moderate stenosis at the para clinoid segments bilaterally. ICA termini widely patent. A1 segments, anterior communicating artery, and anterior cerebral arteries widely patent. M1 segments patent without stenosis. No proximal M2 occlusion. Distal MCA  branches well perfused and symmetric. Posterior circulation: Right vertebral artery patent to the vertebrobasilar junction without stenosis. Atheromatous plaque at the left V4 segment with mild to moderate stenosis. Posterior inferior cerebral arteries patent bilaterally. Basilar widely patent to its distal aspect. Superior cerebellar arteries patent bilaterally. Tandem moderate to severe mid-distal left P2 stenoses. PCAs otherwise widely patent. Venous sinuses: Patent. Anatomic variants: None significant. Delayed phase: No abnormal enhancement. Review of the MIP images confirms the above findings IMPRESSION: 1. Negative CTA for emergent large vessel occlusion. 2. Atheromatous plaque about the proximal ICAs with relatively mild stenosis of up to 30-40% by NASCET criteria, right slightly worse than left. 3. Carotid siphon atherosclerotic change with resultant mild to moderate stenoses at the para clinoid segments. 4. Tandem moderate to severe mid-distal left P2 stenoses. 5. Emphysema with findings suggestive of superimposed pulmonary edema. Electronically Signed   By: Jeannine Boga M.D.   On: 04/12/2018 04:06   Dg Chest 2 View  Result Date: 04/12/2018 CLINICAL DATA:  STROKE-LIKE  SYMPTOMS, SORE THROAT, HTN, FORMER SMOKER X 20 YEARS EXAM: CHEST - 2 VIEW COMPARISON:  08/15/2014 FINDINGS: Low lung volumes. Resulting crowding of bronchovascular structures. Left infrahilar subsegmental atelectasis or infiltrate. Heart size and mediastinal contours are within normal limits. Aortic Atherosclerosis (ICD10-170.0) No effusion. Previous median sternotomy and CABG. IMPRESSION: 1. Low volumes with left infrahilar atelectasis or infiltrate. Electronically Signed   By: Lucrezia Europe M.D.   On: 04/12/2018 09:51   Ct Angio Neck W And/or Wo Contrast  Result Date: 04/12/2018 CLINICAL DATA:  Initial evaluation for acute left-sided weakness. EXAM: CT ANGIOGRAPHY HEAD AND NECK TECHNIQUE: Multidetector CT imaging of the head and  neck was performed using the standard protocol during bolus administration of intravenous contrast. Multiplanar CT image reconstructions and MIPs were obtained to evaluate the vascular anatomy. Carotid stenosis measurements (when applicable) are obtained utilizing NASCET criteria, using the distal internal carotid diameter as the denominator. CONTRAST:  159mL ISOVUE-370 IOPAMIDOL (ISOVUE-370) INJECTION 76% COMPARISON:  Prior head CT from earlier same day. FINDINGS: CTA NECK FINDINGS Aortic arch: Visualized arch of normal caliber with normal branch pattern. Atherosclerotic change about the arch and origin of the great vessels without significant stenosis. Visualized subclavian arteries patent. Right carotid system: Right common carotid artery patent to the bifurcation without stenosis. Mixed plaque about the proximal right ICA with associated narrowing of up to 40% by NASCET criteria. Right ICA patent distally to the skull base. Left carotid system: Left common carotid artery patent to the bifurcation without significant stenosis. Atheromatous plaque about the proximal left ICA with associated mild narrowing of no more than 30% by NASCET criteria. Left ICA widely patent distally to the skull base. Vertebral arteries: Both of the vertebral arteries arise from the subclavian arteries. Atheromatous plaque at the origin of the vertebral arteries without hemodynamically significant stenosis. Vertebral arteries otherwise widely patent within the neck without stenosis, dissection, or occlusion. Skeleton: No acute osseous abnormality. 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 No worrisome lytic or blastic osseous lesions. Median sternotomy wires noted. Other neck: No acute soft tissue abnormality within the neck. Salivary and thyroid glands are normal. No adenopathy. Upper chest: Visualized upper mediastinum within normal limits. Emphysema noted. Superimposed diffuse ground-glass opacity with  interlobular septal thickening, most suggestive of edema. Review of the MIP images confirms the above findings CTA HEAD FINDINGS Anterior circulation: Petrous segments widely patent. Scattered atheromatous plaque within the cavernous/supraclinoid ICAs. Mild-to-moderate stenosis at the para clinoid segments bilaterally. ICA termini widely patent. A1 segments, anterior communicating artery, and anterior cerebral arteries widely patent. M1 segments patent without stenosis. No proximal M2 occlusion. Distal MCA branches well perfused and symmetric. Posterior circulation: Right vertebral artery patent to the vertebrobasilar junction without stenosis. Atheromatous plaque at the left V4 segment with mild to moderate stenosis. Posterior inferior cerebral arteries patent bilaterally. Basilar widely patent to its distal aspect. Superior cerebellar arteries patent bilaterally. Tandem moderate to severe mid-distal left P2 stenoses. PCAs otherwise widely patent. Venous sinuses: Patent. Anatomic variants: None significant. Delayed phase: No abnormal enhancement. Review of the MIP images confirms the above findings IMPRESSION: 1. Negative CTA for emergent large vessel occlusion. 2. Atheromatous plaque about the proximal ICAs with relatively mild stenosis of up to 30-40% by NASCET criteria, right slightly worse than left. 3. Carotid siphon atherosclerotic change with resultant mild to moderate stenoses at the para clinoid segments. 4. Tandem moderate to severe mid-distal left P2 stenoses. 5. Emphysema with findings suggestive of superimposed pulmonary edema. Electronically Signed  By: Jeannine Boga M.D.   On: 04/12/2018 04:06   Mr Brain Wo Contrast  Result Date: 04/12/2018 CLINICAL DATA:  Left-sided weakness and confusion for 2 days EXAM: MRI HEAD WITHOUT CONTRAST MRA HEAD WITHOUT CONTRAST TECHNIQUE: Multiplanar, multiecho pulse sequences of the brain and surrounding structures were obtained without intravenous contrast.  Angiographic images of the head were obtained using MRA technique without contrast. COMPARISON:  Head CT from earlier today FINDINGS: MRI HEAD FINDINGS Brain: Restricted diffusion in the right corona radiata region the caudate body measuring 12 mm. Mild cerebral volume loss. Mild chronic small vessel ischemia in the cerebral white matter. No hemorrhage, hydrocephalus, or collection. Vascular: Arterial findings below. Preserved dural venous sinus flow voids. Skull and upper cervical spine: No evidence of marrow lesion. Sinuses/Orbits: Right cataract resection. MRA HEAD FINDINGS Symmetric carotid and vertebral arteries. Incomplete circle-of-Willis by MRA. No branch occlusion or proximal flow limiting stenosis. Atheromatous irregularity of medium size vessels, most notably a moderate left P2 segment stenosis. Negative for aneurysm. IMPRESSION: Brain MRI: 1. 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 2. Mild chronic small vessel ischemia in the cerebral white matter. Intracranial MRA: No acute finding.  Stable from CTA earlier today. Electronically Signed   By: Monte Fantasia M.D.   On: 04/12/2018 08:22   US Carotid Bilateral (at [facility] And [facility] Only)  Result Date: 04/12/2018 CLINICAL DATA:  Left facial and lower extremity weakness. CTA describes bilateral bifurcation plaque with only mild stenosis. EXAM: BILATERAL CAROTID DUPLEX ULTRASOUND TECHNIQUE: Pearline Cables scale imaging, color Doppler and duplex ultrasound was performed of bilateral carotid and vertebral arteries in the neck. COMPARISON:  CTA from the same day TECHNIQUE: Quantification of carotid stenosis is based on velocity parameters that correlate the residual internal carotid diameter with NASCET-based stenosis levels, using the diameter of the distal internal carotid lumen as the denominator for stenosis measurement. The following velocity measurements were obtained: PEAK SYSTOLIC/END DIASTOLIC RIGHT ICA:                     70/19cm/sec CCA:                      87/86VE/[id number] SYSTOLIC ICA/CCA RATIO:  1.0 ECA:                     86cm/sec LEFT ICA:                     62/17cm/sec CCA:                     [id number] SYSTOLIC ICA/CCA RATIO:  0.9 ECA:                     60cm/sec FINDINGS: RIGHT CAROTID ARTERY: Mild plaque through the common carotid artery. Partially calcified eccentric plaque in the carotid bulb extending into proximal internal and external carotid arteries. Some areas of distal acoustic shadowing. Normal waveforms and color Doppler signal in visualized segments. RIGHT VERTEBRAL ARTERY:  Normal flow direction and waveform. LEFT CAROTID ARTERY: Plaque throughout the common carotid artery without high-grade stenosis. Eccentric partially calcified plaque in the distal common carotid artery, bulb, extending into the proximal ICA. No high-grade stenosis. Normal waveforms and color Doppler signal. LEFT VERTEBRAL ARTERY: Normal flow direction and waveform. IMPRESSION: 1. Bilateral carotid bifurcation and proximal ICA plaque, right greater than left, resulting in less than 50% diameter stenosis. 2.  Antegrade bilateral vertebral  arterial flow. Electronically Signed   By: Lucrezia Europe M.D.   On: 04/12/2018 09:49   US Arterial Abi (screening Lower Extremity)  Result Date: 04/12/2018 CLINICAL DATA:  77 year old male with suspected peripheral arterial disease EXAM: NONINVASIVE PHYSIOLOGIC VASCULAR STUDY OF BILATERAL LOWER EXTREMITIES TECHNIQUE: Evaluation of both lower extremities were performed at rest, including calculation of ankle-brachial indices with single level Doppler, pressure and pulse volume recording. COMPARISON:  None. FINDINGS: Right ABI:  1.0 Left ABI:  0.84 Right Lower Extremity:  Normal arterial waveforms at the ankle. Left Lower Extremity: Mildly blunted arterial waveforms at the ankle. 0.8-0.89 Mild PAD IMPRESSION: 1. Abnormal resting left ankle-brachial index of 0.84 consistent with at least mild peripheral arterial disease. 2.  Normal resting right ankle-brachial index of 1.0 Electronically Signed   By: Jacqulynn Cadet M.D.   On: 04/12/2018 08:52   Mr Jodene Nam Head/brain SA Cm  Result Date: 04/12/2018 CLINICAL DATA:  Left-sided weakness and confusion for 2 days EXAM: MRI HEAD WITHOUT CONTRAST MRA HEAD WITHOUT CONTRAST TECHNIQUE: Multiplanar, multiecho pulse sequences of the brain and surrounding structures were obtained without intravenous contrast. Angiographic images of the head were obtained using MRA technique without contrast. COMPARISON:  Head CT from earlier today FINDINGS: MRI HEAD FINDINGS Brain: Restricted diffusion in the right corona radiata region the caudate body measuring 12 mm. Mild cerebral volume loss. Mild chronic small vessel ischemia in the cerebral white matter. No hemorrhage, hydrocephalus, or collection. Vascular: Arterial findings below. Preserved dural venous sinus flow voids. Skull and upper cervical spine: No evidence of marrow lesion. Sinuses/Orbits: Right cataract resection. MRA HEAD FINDINGS Symmetric carotid and vertebral arteries. Incomplete circle-of-Willis by MRA. No branch occlusion or proximal flow limiting stenosis. Atheromatous irregularity of medium size vessels, most notably a moderate left P2 segment stenosis. Negative for aneurysm. IMPRESSION: Brain MRI: 1. 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 2. Mild chronic small vessel ischemia in the cerebral white matter. Intracranial MRA: No acute finding.  Stable from CTA earlier today. Electronically Signed   By: Monte Fantasia M.D.   On: 04/12/2018 08:22   Ct Head Code Stroke Wo Contrast  Result Date: 04/12/2018 CLINICAL DATA:  Code stroke. Initial evaluation for acute left-sided weakness. EXAM: CT HEAD WITHOUT CONTRAST TECHNIQUE: Contiguous axial images were obtained from the base of the skull through the vertex without intravenous contrast. COMPARISON:  Prior MRI from 07/31/2014. FINDINGS: Brain: Age-related cerebral  atrophy with mild chronic small vessel ischemic change. No acute intracranial hemorrhage. No acute large vessel territory infarct. No mass lesion, midline shift or mass effect. No hydrocephalus. No extra-axial fluid collection. Vascular: No asymmetric hyperdense vessel. Calcified atherosclerotic change at the skull base. Skull: Scalp soft tissues within normal limits.  Calvarium intact. Sinuses/Orbits: Globes and orbital soft tissues within normal limits. Paranasal sinuses and mastoid air cells are clear. Other: None. ASPECTS [facility] Stroke Program Early CT Score) - Ganglionic level infarction (caudate, lentiform nuclei, internal capsule, insula, M1-M3 cortex): 7 - Supraganglionic infarction (M4-M6 cortex): 3 Total score (0-10 with 10 being normal): 10 IMPRESSION: 1. No acute intracranial infarct or other abnormality identified. 2. ASPECTS is 10. 3. Mild age-related cerebral atrophy with chronic small vessel ischemic disease. Critical Value/emergent results were called by telephone at the time of interpretation on 04/12/2018 at 2:40 am to Dr. Ripley Fraise , who verbally acknowledged these results. Electronically Signed   By: Jeannine Boga M.D.   On: 04/12/2018 02:41       Subjective: Patient seen  and examined at bedside this morning.  Remains comfortable.  Denies any chest pain at present.  Underwent cath yesterday.  Complains of some induration, reflux symptoms.  He complains of on and off right upper quadrant pain.  No tenderness on examination today.  Discharge Exam: Vitals:   04/17/18 0605 04/17/18 0759  BP: (!) 167/86 (!) 157/85  Pulse: 77 76  Resp: 17 13  Temp:  97.8 F (36.6 C)  SpO2: 96% 98%   Vitals:   04/16/18 2000 04/17/18 0604 04/17/18 0605 04/17/18 0759  BP: (!) 151/74 (!) 175/94 (!) 167/86 (!) 157/85  Pulse: 80 78 77 76  Resp: 19 16 17 13   Temp: (!) 97.5 F (36.4 C) 97.7 F (36.5 C)  97.8 F (36.6 C)  TempSrc: Oral Oral  Oral  SpO2: 96% 95% 96% 98%  Weight:   83  kg (182 lb 15.7 oz)   Height:        General: Pt is alert, awake, not in acute distress Cardiovascular: RRR, S1/S2 +, no rubs, no gallops Respiratory: CTA bilaterally, no wheezing, no rhonchi Abdominal: Soft, NT, ND, bowel sounds + Extremities: no edema, no cyanosis    The results of significant diagnostics from this hospitalization (including imaging, microbiology, ancillary and laboratory) are listed below for reference.     Microbiology: No results found for this or any previous visit (from the past 240 hour(s)).   Labs: BNP (last 3 results) No results for input(s): BNP in the last 8760 hours. Basic Metabolic Panel: Recent Labs  Lab 04/12/18 0234 04/12/18 0246 04/13/18 0512 04/16/18 0756 04/17/18 1000  NA 133* 135 135 136 135  K 4.1 4.1 4.0 4.0 4.1  CL 98* 99* 102 105 103  CO2 25  --  25 21* 21*  GLUCOSE 202* 201* 135* 153* 195*  BUN 15 14 12 15  24*  CREATININE 1.09 1.10 1.05 1.21 1.24  CALCIUM 9.6  --  8.7* 9.0 9.0  MG 1.8  --   --   --   --   PHOS 3.1  --   --   --   --    Liver Function Tests: Recent Labs  Lab 04/12/18 0234 04/17/18 1000  AST 31 55*  ALT 39 62  ALKPHOS 79 71  BILITOT 0.8 0.9  PROT 7.8 7.0  ALBUMIN 4.1 3.5   No results for input(s): LIPASE, AMYLASE in the last 168 hours. No results for input(s): AMMONIA in the last 168 hours. CBC: Recent Labs  Lab 04/12/18 0234  04/13/18 0512 04/14/18 0557 04/15/18 1038 04/16/18 0756 04/17/18 0222  WBC 7.2  --  8.3 9.0 8.8 9.4 8.5  NEUTROABS 3.2  --   --   --   --   --   --   HGB 14.8   < > 14.5 14.1 15.9 15.2 14.1  HCT 41.9   < > 42.2 41.3 45.2 43.0 40.5  MCV 92.1  --  93.2 94.7 93.8 93.3 92.5  PLT 124*  --  124* 125* 149* 144* 126*   < > = values in this interval not displayed.   Cardiac Enzymes: Recent Labs  Lab 04/12/18 0234 04/12/18 1006 04/12/18 1405 04/13/18 0512  TROPONINI 0.06* 0.87* 0.86* 0.52*   BNP: Invalid input(s): POCBNP CBG: Recent Labs  Lab 04/16/18 1106  04/16/18 1547 04/16/18 1724 04/16/18 2042 04/17/18 0659  GLUCAP 115* 184* 184* 169* 244*   D-Dimer No results for input(s): DDIMER in the last 72 hours. Hgb A1c No results for  input(s): HGBA1C in the last 72 hours. Lipid Profile No results for input(s): CHOL, HDL, LDLCALC, TRIG, CHOLHDL, LDLDIRECT in the last 72 hours. Thyroid function studies No results for input(s): TSH, T4TOTAL, T3FREE, THYROIDAB in the last 72 hours.  Invalid input(s): FREET3 Anemia work up No results for input(s): VITAMINB12, FOLATE, FERRITIN, TIBC, IRON, RETICCTPCT in the last 72 hours. Urinalysis    Component Value Date/Time   COLORURINE STRAW (A) 04/12/2018 0320   APPEARANCEUR CLEAR 04/12/2018 0320   LABSPEC 1.012 04/12/2018 0320   PHURINE 7.0 04/12/2018 0320   GLUCOSEU 50 (A) 04/12/2018 0320   HGBUR SMALL (A) 04/12/2018 0320   BILIRUBINUR NEGATIVE 04/12/2018 0320   KETONESUR NEGATIVE 04/12/2018 0320   PROTEINUR NEGATIVE 04/12/2018 0320   NITRITE NEGATIVE 04/12/2018 0320   LEUKOCYTESUR NEGATIVE 04/12/2018 0320   Sepsis Labs Invalid input(s): PROCALCITONIN,  WBC,  LACTICIDVEN Microbiology No results found for this or any previous visit (from the past 240 hour(s)).   Time coordinating discharge: 35 minutes  SIGNED:   Shelly Coss, MD  [facility] 04/17/2018, 11:52 AM Pager [phone]  If 7PM-7AM, please contact night-coverage [email or web address] Password [email or web address]

## 2018-04-17 NOTE — Progress Notes (Signed)
Progress Note  Patient Name: Peter [name] Date of Encounter: 04/17/2018  Primary Cardiologist: Carlyle Dolly, MD   Subjective   Patient with complaints of right lateral chest pain radiating upward to right arm which started after eating breakfast this morning. He denies chest pressure, SOB, diaphoresis, N/V. Notes this feels like heartburn although reports he has not had heartburn in years.  Inpatient Medications    Scheduled Meds: . aspirin EC  325 mg Oral Daily  . atorvastatin  80 mg Oral q1800  . heparin  5,000 Units Subcutaneous Q8H  . insulin aspart  0-15 Units Subcutaneous TID WC  . metoprolol succinate  25 mg Oral Daily  . sacubitril-valsartan  1 tablet Oral BID  . sodium chloride flush  3 mL Intravenous Q12H   Continuous Infusions: . sodium chloride Stopped (04/17/18 0200)   PRN Meds: sodium chloride, acetaminophen **OR** acetaminophen (TYLENOL) oral liquid 160 mg/5 mL **OR** acetaminophen, alum & mag hydroxide-simeth, ondansetron **OR** ondansetron (ZOFRAN) IV, sodium chloride flush   Vital Signs    Vitals:   04/16/18 2000 04/17/18 0604 04/17/18 0605 04/17/18 0759  BP: (!) 151/74 (!) 175/94 (!) 167/86 (!) 157/85  Pulse: 80 78 77 76  Resp: 19 16 17 13   Temp: (!) 97.5 F (36.4 C) 97.7 F (36.5 C)  97.8 F (36.6 C)  TempSrc: Oral Oral  Oral  SpO2: 96% 95% 96% 98%  Weight:   182 lb 15.7 oz (83 kg)   Height:        Intake/Output Summary (Last 24 hours) at 04/17/2018 0843 Last data filed at 04/17/2018 0804 Gross per 24 hour  Intake 1200.35 ml  Output 775 ml  Net 425.35 ml   Filed Weights   04/14/18 1749 04/16/18 0515 04/17/18 0605  Weight: 188 lb 0.8 oz (85.3 kg) 183 lb 6.8 oz (83.2 kg) 182 lb 15.7 oz (83 kg)    Telemetry    NSR with occasional PVCs - Personally Reviewed  Physical Exam   GEN: Laying in bed in no acute distress.   Neck: No JVD, no carotid bruits Cardiac: RRR, no murmurs, rubs, or gallops. No chest wall TTP  Respiratory: Clear  to auscultation bilaterally, no wheezes/ rales/ rhonchi GI: NABS, Soft, nontender, non-distended  MS: No edema; No deformity. No worsening of right chest wall pain with movement Neuro:  Nonfocal, moving all extremities spontaneously Psych: Normal affect   Labs    Chemistry Recent Labs  Lab 04/12/18 0234 04/12/18 0246 04/13/18 0512 04/16/18 0756  NA 133* 135 135 136  K 4.1 4.1 4.0 4.0  CL 98* 99* 102 105  CO2 25  --  25 21*  GLUCOSE 202* 201* 135* 153*  BUN 15 14 12 15   CREATININE 1.09 1.10 1.05 1.21  CALCIUM 9.6  --  8.7* 9.0  PROT 7.8  --   --   --   ALBUMIN 4.1  --   --   --   AST 31  --   --   --   ALT 39  --   --   --   ALKPHOS 79  --   --   --   BILITOT 0.8  --   --   --   GFRNONAA >60  --  >60 56*  GFRAA >60  --  >60 >60  ANIONGAP 10  --  8 10     Hematology Recent Labs  Lab 04/15/18 1038 04/16/18 0756 04/17/18 0222  WBC 8.8 9.4 8.5  RBC 4.82  4.61 4.38  HGB 15.9 15.2 14.1  HCT 45.2 43.0 40.5  MCV 93.8 93.3 92.5  MCH 33.0 33.0 32.2  MCHC 35.2 35.3 34.8  RDW 13.0 13.2 13.0  PLT 149* 144* 126*    Cardiac Enzymes Recent Labs  Lab 04/12/18 0234 04/12/18 1006 04/12/18 1405 04/13/18 0512  TROPONINI 0.06* 0.87* 0.86* 0.52*   No results for input(s): TROPIPOC in the last 168 hours.   BNPNo results for input(s): BNP, PROBNP in the last 168 hours.   DDimer No results for input(s): DDIMER in the last 168 hours.   Radiology    No results found.  Cardiac Studies   Left Heart Catheterization 04/16/18: Conclusion     Ost LAD lesion is 50% stenosed.  Prox LAD lesion is 100% stenosed.  Ost Cx to Prox Cx lesion is 100% stenosed.  Prox Cx to Mid Cx lesion is 100% stenosed.  Ost Ramus to Ramus lesion is 100% stenosed.  Prox RCA lesion is 100% stenosed.  LIMA graft was visualized by angiography and is normal in caliber.  The graft exhibits no disease.  Seq SVG- OM1 and OM2 graft was visualized by angiography and is large.  The graft  exhibits mild diffuse disease.  Seq SVG- diagonal and ramus intermediate graft was visualized by angiography and is normal in caliber.  Prox Graft lesion before Ost 1st Diag is 50% stenosed.  Origin to Prox Graft lesion between Ost 1st Diag and Ramus is 50% stenosed.  1st Diag lesion is 100% stenosed.  SVG graft was visualized by angiography.  Origin to Prox Graft lesion is 100% stenosed.  Previously placed Mid LAD stent (unknown type) is widely patent.  Dist LAD lesion is 90% stenosed.  LV end diastolic pressure is normal.   1. Severe 3 vessel occlusive CAD. Occlusion of the proximal LAD, ramus intermediate, proximal and mid LCx, and ostial RCA 2. Patent LIMA to the LAD. Stent in the mid LAD after the graft is patent. 3. Patent SVG to the ramus intermediate. This previously also supplied a small caliber diagonal. The diagonal is now occluded. 4. Patent sequential SVG to OM1 and OM2. 5. Occluded SVG to PDA/PLOM 6. Normal LVEDP  Plan: continue medical therapy. I think the diagonal occlusion is old and of little consequence.    Echocardiogram 04/12/18: Study Conclusions  - Left ventricle: The cavity size was normal. Wall thickness was   increased in a pattern of moderate LVH. Systolic function was   moderately to severely reduced. The estimated ejection fraction   was in the range of 30% to 35%. Features are consistent with a   pseudonormal left ventricular filling pattern, with concomitant   abnormal relaxation and increased filling pressure (grade 2   diastolic dysfunction). Doppler parameters are consistent with   high ventricular filling pressure. - Regional wall motion abnormality: Akinesis of the mid-apical   anterior, basal-mid anteroseptal, mid inferoseptal, apical   septal, and apical myocardium. - Aortic valve: Mildly calcified annulus. Trileaflet; mildly   thickened leaflets. Valve area (VTI): 2.94 cm^2. Valve area   (Vmax): 3.12 cm^2. - Mitral valve: Mildly  calcified annulus. Normal thickness leaflets   . - Left atrium: The atrium was mildly dilated.  Patient Profile     77 y.o. male CAD s/p CABG, HTN, HLD, COPD, DM type 2, who presented with LE weakness and facial weakness, found to have acute CVA. Cardiology following for elevated troponin.   Assessment & Plan    1. CAD s/p CABG: found to  have elevated troponins (peak 0.86) and decrease in LV function (30-35% on echo 04/12/18). Patient underwent LHC 04/16/18 which revealed patent LIMA to LAD and SVG to ramus intermediate, occluded SVG to PDA, and occluded diagonal  For questions or updates, please contact [place] Please consult [email or web address] for contact info under Cardiology/STEMI.      Signed, Abigail Butts, PA-C  04/17/2018, 8:43 AM   [phone]  Personally seen and examined. Agree with above.  Feels better now, no longer having right upper quad pain. He is tearful talking about his prayer life. Takes care of demented wife for 4 years. Used to drink 8-10 beers a night, no longer.   GEN: Well nourished, well developed, in no acute distress  HEENT: normal  Neck: no JVD, carotid bruits, or masses Cardiac: RRR; no murmurs, rubs, or gallops,no edema  Respiratory:  clear to auscultation bilaterally, normal work of breathing GI: soft, nontender, nondistended, + BS MS: no deformity or atrophy  Skin: warm and dry, no rash Neuro:  Alert and Oriented x 3, Strength and sensation are intact Psych: euthymic mood, full affect  Labs: LFT's normal Tele: no adverse  A/P:  CAD post CABG with cardiomyopathy  - Added Entresto. Toprol. Close follow up  - EF 30. Elevated troponin increases CV morbidity and mortality surrounding stroke.   DM with HTN  - Increase Entresto as outpatient. Will improve BP. Increase Toprol as well.  Prior ETOH  - Plts low. LFT's ok. RUQ pain. Resolved. Consider further workup, perhaps as outpatient.   Will sign off. OK with DC from our perspective.    Candee Furbish, MD

## 2018-04-17 NOTE — Telephone Encounter (Signed)
Nurse's-patient recently discharged from the hospital. Please call patient, let them know that we are aware that they were discharged from the hospital. Please schedule them to follow-up with [place] within the next 7 days. Advised the patient to bring all of their medications with him to the visit. Please inquire if they are having any acute issues currently and documented accordingly. Patient recently had stroke.  Please encourage him to bring all of his medications with him to his follow-up office visit I would like to see him next week.

## 2018-04-17 NOTE — Discharge Instructions (Signed)

## 2018-04-17 NOTE — Consult Note (Signed)
            [facility] CM Primary Care Navigator  04/17/2018  Peter [name] 1941/05/25 [id number]   Went to see patient at the bedside to identify possible discharge needs but he was already discharged per staff report.  Patient went home with home health services per therapy recommendation.   Per MD note, patient was seen for stroke- like symptoms. MRI showed acute stroke. He also has bilateral carotid stenosis and underwent cardiac catheterization.  Primary care provider's officeis listed as providingtransition of care (TOC)follow-up.   Patient has discharge instruction to follow-up withprimary care provider in 1 week; follow-up with cardiology and neurology in 2 weeks.   For additional questions please contact:  Edwena Felty A. Stephen Baruch, BSN, RN-BC [facility] PRIMARY CARE Navigator Cell: [phone]

## 2018-04-18 ENCOUNTER — Encounter: Payer: Self-pay | Admitting: Student

## 2018-04-18 MED FILL — Heparin Sod (Porcine)-NaCl IV Soln 1000 Unit/500ML-0.9%: INTRAVENOUS | Qty: 1000 | Status: AC

## 2018-04-18 NOTE — Telephone Encounter (Signed)
Patient notified and stated he was released from the hospital yesterday and seems to be doing ok. Patient scheduled follow up up office visit and was advised to bring all medications to office visit.

## 2018-04-19 ENCOUNTER — Telehealth: Payer: Self-pay

## 2018-04-19 DIAGNOSIS — I110 Hypertensive heart disease with heart failure: Secondary | ICD-10-CM | POA: Diagnosis not present

## 2018-04-19 DIAGNOSIS — I69392 Facial weakness following cerebral infarction: Secondary | ICD-10-CM | POA: Diagnosis not present

## 2018-04-19 DIAGNOSIS — E119 Type 2 diabetes mellitus without complications: Secondary | ICD-10-CM | POA: Diagnosis not present

## 2018-04-19 DIAGNOSIS — I69354 Hemiplegia and hemiparesis following cerebral infarction affecting left non-dominant side: Secondary | ICD-10-CM | POA: Diagnosis not present

## 2018-04-19 DIAGNOSIS — I5040 Unspecified combined systolic (congestive) and diastolic (congestive) heart failure: Secondary | ICD-10-CM | POA: Diagnosis not present

## 2018-04-19 NOTE — Telephone Encounter (Signed)
I spoke with patient.He states he didn't sleep well last night and will call back later

## 2018-04-23 ENCOUNTER — Telehealth: Payer: Self-pay | Admitting: Family Medicine

## 2018-04-23 NOTE — Telephone Encounter (Signed)
error 

## 2018-04-24 DIAGNOSIS — I69392 Facial weakness following cerebral infarction: Secondary | ICD-10-CM | POA: Diagnosis not present

## 2018-04-24 DIAGNOSIS — I5040 Unspecified combined systolic (congestive) and diastolic (congestive) heart failure: Secondary | ICD-10-CM | POA: Diagnosis not present

## 2018-04-24 DIAGNOSIS — E119 Type 2 diabetes mellitus without complications: Secondary | ICD-10-CM | POA: Diagnosis not present

## 2018-04-24 DIAGNOSIS — I110 Hypertensive heart disease with heart failure: Secondary | ICD-10-CM | POA: Diagnosis not present

## 2018-04-24 DIAGNOSIS — I69354 Hemiplegia and hemiparesis following cerebral infarction affecting left non-dominant side: Secondary | ICD-10-CM | POA: Diagnosis not present

## 2018-04-25 ENCOUNTER — Ambulatory Visit (INDEPENDENT_AMBULATORY_CARE_PROVIDER_SITE_OTHER): Payer: Medicare Other | Admitting: Family Medicine

## 2018-04-25 ENCOUNTER — Encounter: Payer: Self-pay | Admitting: Family Medicine

## 2018-04-25 VITALS — BP 124/80 | Ht 72.0 in | Wt 186.0 lb

## 2018-04-25 DIAGNOSIS — E785 Hyperlipidemia, unspecified: Secondary | ICD-10-CM

## 2018-04-25 DIAGNOSIS — I10 Essential (primary) hypertension: Secondary | ICD-10-CM | POA: Diagnosis not present

## 2018-04-25 DIAGNOSIS — E1165 Type 2 diabetes mellitus with hyperglycemia: Secondary | ICD-10-CM | POA: Diagnosis not present

## 2018-04-25 DIAGNOSIS — Z23 Encounter for immunization: Secondary | ICD-10-CM | POA: Diagnosis not present

## 2018-04-25 MED ORDER — METFORMIN HCL 500 MG PO TABS: 500.0000 mg | ORAL_TABLET | Freq: Every day | ORAL | 5 refills | Status: DC

## 2018-04-25 NOTE — Progress Notes (Addendum)
Subjective:    Patient ID: Peter [name], male    DOB: January 17, 1941, 77 y.o.   MRN: [id number]  HPIpt arrives with son Peter [name] for a hospital follow up.  Very nice gentleman presents today for follow-up from a recent stroke plus also cardiac catheterization while in the hospital he had numerous tests his hemoglobin A1c showed that his A1c has risen to 7.7 therefore he is no longer well controlled by diet alone the patient admits to consuming too much sugar and carbohydrates he also takes care of his wife a large portion of the day and does not get much in the way of physical exercise  Patient had cardiac catheterization which showed a small blockage but did not show any major issues this patient was started on statin while in the hospital it is interesting to note that the patient has refused statins multiple times in the past and also has been resistant to pneumonia vaccines and similar standard healthcare updates   Needs refill on nitroglyerin.   Would like to discuss entresto. Having some side effects from med and it cost $600 a month.  This patient was found to have mildly depressed left ventricular function during the catheterization.  This indicates CHF.  The patient was educated in regards to CHF can be mild or severe his appears to be mild.  Patient would benefit from seeing cardiology.  Son-Peter [name]- concerned about overdue health on pt's mychart. Pt declines tetanus, pneumonia. He states he saw eye dr in march and exam was normal. Pt declines foot doctor.     Review of Systems  Constitutional: Negative for activity change, appetite change and fatigue.  HENT: Negative for congestion and rhinorrhea.   Respiratory: Negative for cough, chest tightness and shortness of breath.   Cardiovascular: Negative for chest pain and leg swelling.  Gastrointestinal: Negative for abdominal pain, diarrhea and nausea.  Endocrine: Negative for polydipsia and polyphagia.  Genitourinary: Negative for dysuria  and hematuria.  Neurological: Negative for weakness and headaches.  Psychiatric/Behavioral: Negative for confusion and dysphoric mood.       Objective:   Physical Exam  Constitutional: He appears well-developed and well-nourished. No distress.  HENT:  Head: Normocephalic and atraumatic.  Eyes: Right eye exhibits no discharge. Left eye exhibits no discharge.  Neck: No tracheal deviation present.  Cardiovascular: Normal rate, regular rhythm and normal heart sounds. Exam reveals no friction rub.  No murmur heard. Pulmonary/Chest: Effort normal and breath sounds normal. No stridor. No respiratory distress.  Musculoskeletal: He exhibits no deformity.  Patient does have weakness associated with his stroke walks with a walker  Neurological: He is alert. Coordination abnormal.  Patient does have some difficulty walking because of his stroke  Skin: Skin is warm and dry.  Psychiatric: He has a normal mood and affect. His behavior is normal.          Assessment & Plan:  Significant time was spent with the patient and with his son We talked about how in the past we have recommended pneumonia vaccines have recommended various lab work and the patient often declines medications including statins and vaccines We also discussed how the patient in the past has had numerous labs ordered to help monitor his condition he is not followed through on getting this This patient is a very nice gentleman but he spends a significant amount of his time looking after his wife Because of this he has failed to follow through on looking after himself  Blood pressure good  control overall continue current measures  Diabetes subpar control add metformin 500 mg 1 daily diabetic education recommended continue to monitor glucose send [place] some readings for our following Follow-up patient in 3 months check A1c here in the office when he follows up-we will monitor kidney function as we go forward  Cardiac disease with  CHF patient admits he will have a difficult time affording this newer medication.  Hopefully cardiology office can help get him this medication if not able to do so he may have to be on a lesser medicine like an ACE inhibitor-patient did not have any episodes of atrial fibrillation while in the hospital but because of his stroke he may need to undergo telemetry monitoring to rule out atrial fibrillation  Patient will also be following up with neurology for further evaluation and consultation about the possibility of further work-up to prevent strokes  Face-to-face evaluation was completed.  Patient would benefit from having ongoing home health as well as physical therapy to assist him with his issues

## 2018-04-25 NOTE — Patient Instructions (Signed)
Diabetes Mellitus and Nutrition When you have diabetes (diabetes mellitus), it is very important to have healthy eating habits because your blood sugar (glucose) levels are greatly affected by what you eat and drink. Eating healthy foods in the appropriate amounts, at about the same times every day, can help you:  Control your blood glucose.  Lower your risk of heart disease.  Improve your blood pressure.  Reach or maintain a healthy weight.  Every person with diabetes is different, and each person has different needs for a meal plan. Your health care provider may recommend that you work with a diet and nutrition specialist (dietitian) to make a meal plan that is best for you. Your meal plan may vary depending on factors such as:  The calories you need.  The medicines you take.  Your weight.  Your blood glucose, blood pressure, and cholesterol levels.  Your activity level.  Other health conditions you have, such as heart or kidney disease.  How do carbohydrates affect me? Carbohydrates affect your blood glucose level more than any other type of food. Eating carbohydrates naturally increases the amount of glucose in your blood. Carbohydrate counting is a method for keeping track of how many carbohydrates you eat. Counting carbohydrates is important to keep your blood glucose at a healthy level, especially if you use insulin or take certain oral diabetes medicines. It is important to know how many carbohydrates you can safely have in each meal. This is different for every person. Your dietitian can help you calculate how many carbohydrates you should have at each meal and for snack. Foods that contain carbohydrates include:  Bread, cereal, rice, pasta, and crackers.  Potatoes and corn.  Peas, beans, and lentils.  Milk and yogurt.  Fruit and juice.  Desserts, such as cakes, cookies, ice cream, and candy.  How does alcohol affect me? Alcohol can cause a sudden decrease in blood  glucose (hypoglycemia), especially if you use insulin or take certain oral diabetes medicines. Hypoglycemia can be a life-threatening condition. Symptoms of hypoglycemia (sleepiness, dizziness, and confusion) are similar to symptoms of having too much alcohol. If your health care provider says that alcohol is safe for you, follow these guidelines:  Limit alcohol intake to no more than 1 drink per day for nonpregnant women and 2 drinks per day for men. One drink equals 12 oz of beer, 5 oz of wine, or 1 oz of hard liquor.  Do not drink on an empty stomach.  Keep yourself hydrated with water, diet soda, or unsweetened iced tea.  Keep in mind that regular soda, juice, and other mixers may contain a lot of sugar and must be counted as carbohydrates.  What are tips for following this plan? Reading food labels  Start by checking the serving size on the label. The amount of calories, carbohydrates, fats, and other nutrients listed on the label are based on one serving of the food. Many foods contain more than one serving per package.  Check the total grams (g) of carbohydrates in one serving. You can calculate the number of servings of carbohydrates in one serving by dividing the total carbohydrates by 15. For example, if a food has 30 g of total carbohydrates, it would be equal to 2 servings of carbohydrates.  Check the number of grams (g) of saturated and trans fats in one serving. Choose foods that have low or no amount of these fats.  Check the number of milligrams (mg) of sodium in one serving. Most people   should limit total sodium intake to less than 2,300 mg per day.  Always check the nutrition information of foods labeled as "low-fat" or "nonfat". These foods may be higher in added sugar or refined carbohydrates and should be avoided.  Talk to your dietitian to identify your daily goals for nutrients listed on the label. Shopping  Avoid buying canned, premade, or processed foods. These  foods tend to be high in fat, sodium, and added sugar.  Shop around the outside edge of the grocery store. This includes fresh fruits and vegetables, bulk grains, fresh meats, and fresh dairy. Cooking  Use low-heat cooking methods, such as baking, instead of high-heat cooking methods like deep frying.  Cook using healthy oils, such as olive, canola, or sunflower oil.  Avoid cooking with butter, cream, or high-fat meats. Meal planning  Eat meals and snacks regularly, preferably at the same times every day. Avoid going long periods of time without eating.  Eat foods high in fiber, such as fresh fruits, vegetables, beans, and whole grains. Talk to your dietitian about how many servings of carbohydrates you can eat at each meal.  Eat 4-6 ounces of lean protein each day, such as lean meat, chicken, fish, eggs, or tofu. 1 ounce is equal to 1 ounce of meat, chicken, or fish, 1 egg, or 1/4 cup of tofu.  Eat some foods each day that contain healthy fats, such as avocado, nuts, seeds, and fish. Lifestyle   Check your blood glucose regularly.  Exercise at least 30 minutes 5 or more days each week, or as told by your health care provider.  Take medicines as told by your health care provider.  Do not use any products that contain nicotine or tobacco, such as cigarettes and e-cigarettes. If you need help quitting, ask your health care provider.  Work with a counselor or diabetes educator to identify strategies to manage stress and any emotional and social challenges. What are some questions to ask my health care provider?  Do I need to meet with a diabetes educator?  Do I need to meet with a dietitian?  What number can I call if I have questions?  When are the best times to check my blood glucose? Where to find more information:  American Diabetes Association: diabetes.org/food-and-fitness/food  Academy of Nutrition and Dietetics:  www.eatright.org/resources/health/diseases-and-conditions/diabetes  National Institute of Diabetes and Digestive and Kidney Diseases (NIH): www.niddk.nih.gov/health-information/diabetes/overview/diet-eating-physical-activity Summary  A healthy meal plan will help you control your blood glucose and maintain a healthy lifestyle.  Working with a diet and nutrition specialist (dietitian) can help you make a meal plan that is best for you.  Keep in mind that carbohydrates and alcohol have immediate effects on your blood glucose levels. It is important to count carbohydrates and to use alcohol carefully. This information is not intended to replace advice given to you by your health care provider. Make sure you discuss any questions you have with your health care provider. Document Released: 08/18/2005 Document Revised: 12/26/2016 Document Reviewed: 12/26/2016 Elsevier Interactive Patient Education  2018 Elsevier Inc.  

## 2018-04-26 DIAGNOSIS — I69354 Hemiplegia and hemiparesis following cerebral infarction affecting left non-dominant side: Secondary | ICD-10-CM

## 2018-04-26 DIAGNOSIS — I110 Hypertensive heart disease with heart failure: Secondary | ICD-10-CM

## 2018-04-26 DIAGNOSIS — E119 Type 2 diabetes mellitus without complications: Secondary | ICD-10-CM | POA: Diagnosis not present

## 2018-04-26 DIAGNOSIS — I5040 Unspecified combined systolic (congestive) and diastolic (congestive) heart failure: Secondary | ICD-10-CM | POA: Diagnosis not present

## 2018-04-26 DIAGNOSIS — I69392 Facial weakness following cerebral infarction: Secondary | ICD-10-CM

## 2018-04-27 ENCOUNTER — Encounter (INDEPENDENT_AMBULATORY_CARE_PROVIDER_SITE_OTHER): Payer: Self-pay

## 2018-04-27 ENCOUNTER — Encounter: Payer: Self-pay | Admitting: Family Medicine

## 2018-05-02 DIAGNOSIS — E119 Type 2 diabetes mellitus without complications: Secondary | ICD-10-CM | POA: Diagnosis not present

## 2018-05-02 DIAGNOSIS — I69354 Hemiplegia and hemiparesis following cerebral infarction affecting left non-dominant side: Secondary | ICD-10-CM | POA: Diagnosis not present

## 2018-05-02 DIAGNOSIS — I5040 Unspecified combined systolic (congestive) and diastolic (congestive) heart failure: Secondary | ICD-10-CM | POA: Diagnosis not present

## 2018-05-02 DIAGNOSIS — I110 Hypertensive heart disease with heart failure: Secondary | ICD-10-CM | POA: Diagnosis not present

## 2018-05-02 DIAGNOSIS — I69392 Facial weakness following cerebral infarction: Secondary | ICD-10-CM | POA: Diagnosis not present

## 2018-05-04 ENCOUNTER — Telehealth: Payer: Self-pay | Admitting: Family Medicine

## 2018-05-04 DIAGNOSIS — I69354 Hemiplegia and hemiparesis following cerebral infarction affecting left non-dominant side: Secondary | ICD-10-CM | POA: Diagnosis not present

## 2018-05-04 DIAGNOSIS — I110 Hypertensive heart disease with heart failure: Secondary | ICD-10-CM | POA: Diagnosis not present

## 2018-05-04 DIAGNOSIS — E119 Type 2 diabetes mellitus without complications: Secondary | ICD-10-CM | POA: Diagnosis not present

## 2018-05-04 DIAGNOSIS — I69392 Facial weakness following cerebral infarction: Secondary | ICD-10-CM | POA: Diagnosis not present

## 2018-05-04 DIAGNOSIS — I5040 Unspecified combined systolic (congestive) and diastolic (congestive) heart failure: Secondary | ICD-10-CM | POA: Diagnosis not present

## 2018-05-04 NOTE — Telephone Encounter (Signed)
Patient called to say he was talking to his physical therapist about his neuropathy pain and his therapists suggests Gabapentin.  He wanted to see if we would call that in for him to [facility] in [place].

## 2018-05-06 NOTE — Telephone Encounter (Signed)
Please talk with the patient.  Let him know gabapentin is a good medication to help with neuropathic pain but it is very important to start this at a low dose.  So therefore we start this off with 100 mg capsule maximum is 3 times per day.  To begin with I recommend 1/day for the next 5 days then 1 twice daily for the following 5 days and if he tolerates that then increase it to 1 3 times a day.  Stick with the 3 times per day do not go above that.  Keep all follow-up visits.  #90 with 3 refills-gabapentin 100 mg capsule-1 3 times daily

## 2018-05-07 ENCOUNTER — Other Ambulatory Visit: Payer: Self-pay | Admitting: Family Medicine

## 2018-05-07 MED ORDER — GABAPENTIN 100 MG PO CAPS: 100.0000 mg | ORAL_CAPSULE | Freq: Three times a day (TID) | ORAL | 3 refills | Status: DC

## 2018-05-07 NOTE — Telephone Encounter (Signed)
Spoke with patient and sent med into pharmacy. Pt verbalized understanding

## 2018-05-08 ENCOUNTER — Encounter: Payer: Self-pay | Admitting: Student

## 2018-05-08 NOTE — Progress Notes (Signed)
Cardiology Office Note    Date:  05/09/2018   ID:  Peter [name], DOB [birth year], MRN [id number]  PCP:  Kathyrn Drown, MD  Cardiologist: Carlyle Dolly, MD    Chief Complaint  Patient presents with  . Hospitalization Follow-up    History of Present Illness:    Peter [name] is a 77 y.o. male with past medical history of CAD (s/p CABG in 1997 with stent to SVG-PDA and PTCA of LAD in 2003, cath in 2015 showing patent SVG-OM1-OM2, SVG-RI, SVG-D1, and LIMA-LAD with occluded SVG-PDA and severe stenosis of mid-LAD at LIMA insertion with DES placed), HTN, HLD, and Type 2 DM who presents to the office today for hospital follow-up.   He recently presented to [facility] ED on 04/12/2018 for evaluation of weakness along his upper and lower extremities. CODE STROKE was activated but CT Head showed no acute intracranial abnormalities. 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 Cardiology was consulted as troponin values were found to be elevated (peaked at 0.87 during admission). Echocardiogram showed his EF was further reduced to 30-35%. Given his elevated enzymes and reduced EF, he was transferred to [facility] for definitive evaluation with a cardiac catheterization. This was performed on 04/16/2018 and showed severe 3-vessel CAD with patent LIMA-LAD, patent stent along mid-LAD, patent SVG-OM1-OM2, and patent SVG-RI with occlusion of D1 and known occlusion of SVG-PDA. The diagonal occlusion was thought to be old, therefore continued medical therapy was recommended. He was started on ASA 325mg  daily, Atorvastatin 80mg  daily, Toprol-XL 25mg  daily and Entresto 24-26mg  BID prior to discharge.   In talking with the patient and his son, he reports overall doing well since hospital discharge. 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 Is working with [place] [facility] multiples times per week.    He denies any recent chest pain, dyspnea on exertion, orthopnea, PND, lower extremity edema or palpitations. He has not checked BP regularly at home but it is well-controlled at 126/80 during today's visit.    Past Medical History:  Diagnosis Date  . AAA (abdominal aortic aneurysm) ([place])   . CAD (coronary artery disease)    a. s/p CABG in 1997 b. stent to SVG-PDA and PTCA of LAD in 2003 c. cath in 2015 showing patent SVG-OM1-OM2, SVG-RI, SVG-D1, and LIMA-LAD with occluded SVG-PDA and severe stenosis of mid-LAD at LIMA insertion with DES placed d. 04/2018: similar results to 2015 with patent LAD stent; occlusion of small caliber D1 stenosis with medical management recommended.   Marland Kitchen COPD (chronic obstructive pulmonary disease) ([place])   . Diabetes mellitus    diet controlled  . Dyslipidemia   . Hypertension   . Myocardial infarction [facility]) 1997   stents x2 (2003/2007)    Past Surgical History:  Procedure Laterality Date  . CORONARY ANGIOPLASTY  2003/2007   2 stents  . CORONARY ANGIOPLASTY WITH STENT PLACEMENT  08/14/14   DES to LIMA-LAD insertion  . CORONARY ARTERY BYPASS GRAFT  1997   7 vessels  . CORONARY STENT PLACEMENT  08/15/2014   MID LAD  DES  by Dr Burt Knack  . CYSTECTOMY  2005   top of head-[place]  . EAR CYST EXCISION  07/09/2012   Procedure: CYST REMOVAL;  Surgeon: Jamesetta So, MD;  Location: [facility];  Service: General;  Laterality: N/A;  . LEFT HEART CATH AND CORS/GRAFTS ANGIOGRAPHY N/A 04/16/2018  Procedure: LEFT HEART CATH AND CORS/GRAFTS ANGIOGRAPHY;  Surgeon: Martinique, Peter M, MD;  Location: [facility];  Service: Cardiovascular;  Laterality: N/A;  . LEFT HEART CATHETERIZATION WITH CORONARY/GRAFT ANGIOGRAM N/A 08/14/2014   Procedure: LEFT HEART CATHETERIZATION WITH Beatrix Fetters;  Surgeon: Blane Ohara, MD;  Location: [facility] CATH LAB;  Service: Cardiovascular;  Laterality: N/A;    Current Medications: Outpatient Medications Prior to Visit  Medication  Sig Dispense Refill  . aspirin EC 325 MG EC tablet Take 1 tablet (325 mg total) by mouth daily. 30 tablet 0  . atorvastatin (LIPITOR) 80 MG tablet Take 1 tablet (80 mg total) by mouth daily at 6 PM. 30 tablet 0  . gabapentin (NEURONTIN) 100 MG capsule Take 1 capsule (100 mg total) by mouth 3 (three) times daily. 90 capsule 3  . HYDROcodone-acetaminophen (NORCO) 10-325 MG tablet 1 bid prn pain 60 tablet 0  . ketoconazole (NIZORAL) 2 % cream Apply 1 application topically 2 (two) times daily. APPLY BID TO RASH IN GROIN AREA 15 g 0  . metFORMIN (GLUCOPHAGE) 500 MG tablet Take 1 tablet (500 mg total) by mouth daily with breakfast. 30 tablet 5  . metoprolol succinate (TOPROL-XL) 25 MG 24 hr tablet Take 1 tablet (25 mg total) by mouth daily. 30 tablet 0  . nitroGLYCERIN (NITROSTAT) 0.4 MG SL tablet Place 1 tablet (0.4 mg total) under the tongue every 5 (five) minutes as needed for chest pain. 30 tablet 0  . pantoprazole (PROTONIX) 40 MG tablet Take 1 tablet (40 mg total) by mouth daily. 30 tablet 0  . sacubitril-valsartan (ENTRESTO) 24-26 MG Take 1 tablet by mouth 2 (two) times daily. 60 tablet 0  . traMADol (ULTRAM) 50 MG tablet 1 bid prn 30 tablet 1   No facility-administered medications prior to visit.      Allergies:   Dye fdc red [red dye]; Ivp dye [iodinated diagnostic agents]; Keflex [cephalexin]; and Plavix [clopidogrel]   Social History   Socioeconomic History  . Marital status: Married    Spouse name: Not on file  . Number of children: Not on file  . Years of education: Not on file  . Highest education level: Not on file  Occupational History  . Not on file  Social Needs  . Financial resource strain: Not on file  . Food insecurity:    Worry: Not on file    Inability: Not on file  . Transportation needs:    Medical: Not on file    Non-medical: Not on file  Tobacco Use  . Smoking status: Former Smoker    Packs/day: 3.00    Years: 40.00    Pack years: 120.00    Types:  Cigarettes    Last attempt to quit: 07/02/2000    Years since quitting: 17.8  . Smokeless tobacco: Never Used  Substance and Sexual Activity  . Alcohol use: No    Alcohol/week: 6.0 oz    Types: 10 Cans of beer per week    Comment: quit 3 months (01/2013). 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  . Drug use: No  . Sexual activity: Never    Birth control/protection: None  Lifestyle  . Physical activity:    Days per week: Not on file    Minutes per session: Not on file  . Stress: Not on file  Relationships  . Social connections:    Talks on phone: Not on file    Gets together: Not on file  Attends religious service: Not on file    Active member of club or organization: Not on file    Attends meetings of clubs or organizations: Not on file    Relationship status: Not on file  Other Topics Concern  . Not on file  Social History Narrative  . Not on file     Family History:  The patient's family history includes Heart attack in his mother; Hypertension in his father; Other in his father.   Review of Systems:   Please see the history of present illness.     General:  No chills, fever, night sweats or weight changes.  Cardiovascular:  No chest pain, dyspnea on exertion, edema, orthopnea, palpitations, paroxysmal nocturnal dyspnea. Dermatological: No rash, lesions/masses Respiratory: No cough, dyspnea Urologic: No hematuria, dysuria Abdominal:   No nausea, vomiting, diarrhea, bright red blood per rectum, melena, or hematemesis Neurologic:  No visual changes, changes in mental status. Positive for left-sided weakness.   All other systems reviewed and are otherwise negative except as noted above.   Physical Exam:    VS:  BP 126/80   Pulse 71   Ht 6' (1.829 m)   Wt 185 lb 12.8 oz (84.3 kg)   SpO2 98%   BMI 25.20 kg/m    General: Well developed, well nourished Caucasian male appearing in no acute distress. Head: Normocephalic, atraumatic, sclera  non-icteric, no xanthomas, nares are without discharge.  Neck: No carotid bruits. JVD not elevated.  Lungs: Respirations regular and unlabored, without wheezes or rales.  Heart: Regular rate and rhythm. No S3 or S4.  No murmur, no rubs, or gallops appreciated. Abdomen: Soft, non-tender, non-distended with normoactive bowel sounds. No hepatomegaly. No rebound/guarding. No obvious abdominal masses. Msk:  Strength and tone appear normal for age. No joint deformities or effusions. Extremities: No clubbing or cyanosis. No lower extremity edema.  Distal pedal pulses are 2+ bilaterally. Neuro: Alert and oriented X 3. Moves all extremities spontaneously. No focal deficits noted. Psych:  Responds to questions appropriately with a normal affect. Skin: No rashes or lesions noted  Wt Readings from Last 3 Encounters:  05/09/18 185 lb 12.8 oz (84.3 kg)  04/25/18 186 lb (84.4 kg)  04/17/18 182 lb 15.7 oz (83 kg)     Studies/Labs Reviewed:   EKG:  EKG is not ordered today.   Recent Labs: 04/12/2018: Magnesium 1.8 04/17/2018: ALT 62; BUN 24; Creatinine, Ser 1.24; Hemoglobin 14.1; Platelets 126; Potassium 4.1; Sodium 135   Lipid Panel    Component Value Date/Time   CHOL 198 04/13/2018 0512   CHOL 228 (H) 04/04/2017 1138   TRIG 152 (H) 04/13/2018 0512   HDL 29 (L) 04/13/2018 0512   HDL 39 (L) 04/04/2017 1138   CHOLHDL 6.8 04/13/2018 0512   VLDL 30 04/13/2018 0512   LDLCALC 139 (H) 04/13/2018 0512   LDLCALC 162 (H) 04/04/2017 1138    Additional studies/ records that were reviewed today include:   Echocardiogram: 04/12/2018 Study Conclusions  - Left ventricle: The cavity size was normal. Wall thickness was   increased in a pattern of moderate LVH. Systolic function was   moderately to severely reduced. The estimated ejection fraction   was in the range of 30% to 35%. Features are consistent with a   pseudonormal left ventricular filling pattern, with concomitant   abnormal relaxation and  increased filling pressure (grade 2   diastolic dysfunction). Doppler parameters are consistent with   high ventricular filling pressure. - Regional wall motion abnormality: Akinesis  of the mid-apical   anterior, basal-mid anteroseptal, mid inferoseptal, apical   septal, and apical myocardium. - Aortic valve: Mildly calcified annulus. Trileaflet; mildly   thickened leaflets. Valve area (VTI): 2.94 cm^2. Valve area   (Vmax): 3.12 cm^2. - Mitral valve: Mildly calcified annulus. Normal thickness leaflets   . - Left atrium: The atrium was mildly dilated.  Carotid Dopplers: 04/12/2018 IMPRESSION: 1. Bilateral carotid bifurcation and proximal ICA plaque, right greater than left, resulting in less than 50% diameter stenosis. 2.  Antegrade bilateral vertebral arterial flow.  Cardiac Catheterization: 04/16/2018  Ost LAD lesion is 50% stenosed.  Prox LAD lesion is 100% stenosed.  Ost Cx to Prox Cx lesion is 100% stenosed.  Prox Cx to Mid Cx lesion is 100% stenosed.  Ost Ramus to Ramus lesion is 100% stenosed.  Prox RCA lesion is 100% stenosed.  LIMA graft was visualized by angiography and is normal in caliber.  The graft exhibits no disease.  Seq SVG- OM1 and OM2 graft was visualized by angiography and is large.  The graft exhibits mild diffuse disease.  Seq SVG- diagonal and ramus intermediate graft was visualized by angiography and is normal in caliber.  Prox Graft lesion before Ost 1st Diag is 50% stenosed.  Origin to Prox Graft lesion between Ost 1st Diag and Ramus is 50% stenosed.  1st Diag lesion is 100% stenosed.  SVG graft was visualized by angiography.  Origin to Prox Graft lesion is 100% stenosed.  Previously placed Mid LAD stent (unknown type) is widely patent.  Dist LAD lesion is 90% stenosed.  LV end diastolic pressure is normal.   1. Severe 3 vessel occlusive CAD. Occlusion of the proximal LAD, ramus intermediate, proximal and mid LCx, and ostial RCA 2.  Patent LIMA to the LAD. Stent in the mid LAD after the graft is patent. 3. Patent SVG to the ramus intermediate. This previously also supplied a small caliber diagonal. The diagonal is now occluded. 4. Patent sequential SVG to OM1 and OM2. 5. Occluded SVG to PDA/PLOM 6. Normal LVEDP  Plan: continue medical therapy. I think the diagonal occlusion is old and of little consequence.    Assessment:    1. Coronary artery disease involving coronary bypass graft of native heart without angina pectoris   2. Chronic combined systolic and diastolic heart failure ([place])   3. Ischemic cardiomyopathy   4. Essential hypertension   5. Hyperlipidemia LDL goal <70      Plan:   In order of problems listed above:  1. CAD - s/p CABG in 1997 with stent to SVG-PDA and PTCA of LAD in 2003, cath in 2015 showing patent SVG-OM1-OM2, SVG-RI, SVG-D1, and LIMA-LAD with occluded SVG-PDA and severe stenosis of mid-LAD at LIMA insertion with DES placed. He was recently admitted for a CVA and his echo showed a newly reduced EF. Repeat cath was pursued and showed similar findings to his cath in 2015 with known occlusion of SVG-PDA and some progression of the D1 stenosis but continued medical therapy was recommended.  - he denies any recent episodes of chest pain or dyspnea on exertion.  - continue ASA (on 325mg  daily dosing given recent CVA), BB, and statin therapy.   2. Chronic Combined Systolic and Diastolic CHF/ Ischemic Cardiomyopathy - recent echocardiogram showed his EF is now reduced to 30-35%.  - he denies any recent dyspnea on exertion, orthopnea, PND, or lower extremity edema. Appears euvolemic by examination.  - continue Toprol-XL 25mg  daily and Entresto 24-26mg  BID. Can consider  titration to 49-51mg  BID at his next visit if BP allows. Will need a repeat BMET to assess kidney function and electrolyte levels as this was initiated during his recent hospitalization.   3. HTN - BP is well-controlled at 126/80  during today's visit.  - continue current medication regimen at this time.   4. HLD - FLP during recent admission showed total cholesterol of 198, HDL 29, and LDL 139. - had previously self-discontinued statin therapy in 2017 but was restarted on Atorvastatin 80mg  daily during recent admission. Will need repeat FLP and LFT's in 6-8 weeks. Goal LDL is < 70 in the setting of known CAD and recent CVA.   Medication Adjustments/Labs and Tests Ordered: Current medicines are reviewed at length with the patient today.  Concerns regarding medicines are outlined above.  Medication changes, Labs and Tests ordered today are listed in the Patient Instructions below. Patient Instructions  Medication Instructions:  Your physician recommends that you continue on your current medications as directed. Please refer to the Current Medication list given to you today.  Labwork: NONE   Testing/Procedures: NONE   Follow-Up: Your physician recommends that you schedule a follow-up appointment in: 2-3 Months with Dr. Harl Bowie   Any Other Special Instructions Will Be Listed Below (If Applicable).  If you need a refill on your cardiac medications before your next appointment, please call your pharmacy. Thank you for choosing [facility]!     Signed, Erma Heritage, PA-C  05/09/2018 7:30 PM    [facility] [address] [place], [place] [address] Phone: [phone]

## 2018-05-09 ENCOUNTER — Encounter: Payer: Self-pay | Admitting: Student

## 2018-05-09 ENCOUNTER — Ambulatory Visit: Payer: Medicare Other | Admitting: Student

## 2018-05-09 VITALS — BP 126/80 | HR 71 | Ht 72.0 in | Wt 185.8 lb

## 2018-05-09 DIAGNOSIS — I25810 Atherosclerosis of coronary artery bypass graft(s) without angina pectoris: Secondary | ICD-10-CM

## 2018-05-09 DIAGNOSIS — I10 Essential (primary) hypertension: Secondary | ICD-10-CM | POA: Diagnosis not present

## 2018-05-09 DIAGNOSIS — I5042 Chronic combined systolic (congestive) and diastolic (congestive) heart failure: Secondary | ICD-10-CM | POA: Diagnosis not present

## 2018-05-09 DIAGNOSIS — I255 Ischemic cardiomyopathy: Secondary | ICD-10-CM

## 2018-05-09 DIAGNOSIS — E785 Hyperlipidemia, unspecified: Secondary | ICD-10-CM

## 2018-05-09 NOTE — Patient Instructions (Signed)
Medication Instructions:  Your physician recommends that you continue on your current medications as directed. Please refer to the Current Medication list given to you today.   Labwork: NONE   Testing/Procedures: NONE   Follow-Up: Your physician recommends that you schedule a follow-up appointment in: 2-3 Months with Dr. Harl Bowie    Any Other Special Instructions Will Be Listed Below (If Applicable).     If you need a refill on your cardiac medications before your next appointment, please call your pharmacy. Thank you for choosing [facility]!

## 2018-05-10 ENCOUNTER — Telehealth: Payer: Self-pay | Admitting: *Deleted

## 2018-05-10 DIAGNOSIS — I69354 Hemiplegia and hemiparesis following cerebral infarction affecting left non-dominant side: Secondary | ICD-10-CM | POA: Diagnosis not present

## 2018-05-10 DIAGNOSIS — E119 Type 2 diabetes mellitus without complications: Secondary | ICD-10-CM | POA: Diagnosis not present

## 2018-05-10 DIAGNOSIS — I69392 Facial weakness following cerebral infarction: Secondary | ICD-10-CM | POA: Diagnosis not present

## 2018-05-10 DIAGNOSIS — I110 Hypertensive heart disease with heart failure: Secondary | ICD-10-CM | POA: Diagnosis not present

## 2018-05-10 DIAGNOSIS — I5040 Unspecified combined systolic (congestive) and diastolic (congestive) heart failure: Secondary | ICD-10-CM | POA: Diagnosis not present

## 2018-05-10 NOTE — Addendum Note (Signed)
Addended by: Levonne Hubert on: 05/10/2018 03:04 PM   Modules accepted: Orders

## 2018-05-10 NOTE — Telephone Encounter (Signed)
Patient notified that he needs to have BMET done this week or the start of next week.

## 2018-05-16 ENCOUNTER — Other Ambulatory Visit (HOSPITAL_COMMUNITY)
Admission: RE | Admit: 2018-05-16 | Discharge: 2018-05-16 | Disposition: A | Discharge status: Home / Self Care | Payer: Medicare Other | Source: Ambulatory Visit | Attending: Student | Admitting: Student

## 2018-05-16 DIAGNOSIS — I110 Hypertensive heart disease with heart failure: Secondary | ICD-10-CM | POA: Insufficient documentation

## 2018-05-16 DIAGNOSIS — I5042 Chronic combined systolic (congestive) and diastolic (congestive) heart failure: Secondary | ICD-10-CM | POA: Insufficient documentation

## 2018-05-16 LAB — BASIC METABOLIC PANEL
Anion gap: 10 (ref 5–15)
BUN: 17 mg/dL (ref 6–20)
CO2: 26 mmol/L (ref 22–32)
Calcium: 9.4 mg/dL (ref 8.9–10.3)
Chloride: 100 mmol/L — ABNORMAL LOW (ref 101–111)
Creatinine, Ser: 1.27 mg/dL — ABNORMAL HIGH (ref 0.61–1.24)
GFR calc Af Amer: >60 mL/min (ref >60)
GFR calc non Af Amer: 53 mL/min — ABNORMAL LOW (ref >60)
Glucose, Bld: 151 mg/dL — ABNORMAL HIGH (ref 65–99)
Potassium: 4.2 mmol/L (ref 3.5–5.1)
Sodium: 136 mmol/L (ref 135–145)

## 2018-05-17 ENCOUNTER — Telehealth: Payer: Self-pay | Admitting: Student

## 2018-05-17 DIAGNOSIS — I69392 Facial weakness following cerebral infarction: Secondary | ICD-10-CM | POA: Diagnosis not present

## 2018-05-17 DIAGNOSIS — I5040 Unspecified combined systolic (congestive) and diastolic (congestive) heart failure: Secondary | ICD-10-CM | POA: Diagnosis not present

## 2018-05-17 DIAGNOSIS — I110 Hypertensive heart disease with heart failure: Secondary | ICD-10-CM | POA: Diagnosis not present

## 2018-05-17 DIAGNOSIS — I69354 Hemiplegia and hemiparesis following cerebral infarction affecting left non-dominant side: Secondary | ICD-10-CM | POA: Diagnosis not present

## 2018-05-17 DIAGNOSIS — E119 Type 2 diabetes mellitus without complications: Secondary | ICD-10-CM | POA: Diagnosis not present

## 2018-05-17 MED ORDER — SACUBITRIL-VALSARTAN 24-26 MG PO TABS: 1.0000 | ORAL_TABLET | Freq: Two times a day (BID) | ORAL | 3 refills | Status: DC

## 2018-05-17 NOTE — Telephone Encounter (Signed)
Per phone call from pt-- he's wanting his Rx for sacubitril-valsartan (ENTRESTO) 24-26 MG [[id number]]  Sent to [facility] fax # [phone]

## 2018-05-17 NOTE — Telephone Encounter (Signed)
Spoke with pt, aware script faxed to the number provided.

## 2018-06-12 ENCOUNTER — Telehealth: Payer: Self-pay | Admitting: Family Medicine

## 2018-06-12 NOTE — Telephone Encounter (Signed)
Ok six mo 

## 2018-06-12 NOTE — Telephone Encounter (Signed)
Fax from pharmacy. Requesting refill on Toprol XL 25mg  take on tablet by mouth once daily. Authorized by Dr.Adhikari on 04/17/2018.

## 2018-06-13 ENCOUNTER — Other Ambulatory Visit: Payer: Self-pay | Admitting: *Deleted

## 2018-06-13 MED ORDER — METOPROLOL SUCCINATE ER 25 MG PO TB24: 25.0000 mg | ORAL_TABLET | Freq: Every day | ORAL | 5 refills | Status: DC

## 2018-06-13 NOTE — Telephone Encounter (Signed)
Refill sent to pharm

## 2018-06-26 ENCOUNTER — Ambulatory Visit: Payer: Medicare Other | Admitting: Diagnostic Neuroimaging

## 2018-07-10 ENCOUNTER — Ambulatory Visit: Payer: Medicare Other | Admitting: Family Medicine

## 2018-07-16 DIAGNOSIS — E785 Hyperlipidemia, unspecified: Secondary | ICD-10-CM | POA: Diagnosis not present

## 2018-07-16 DIAGNOSIS — E119 Type 2 diabetes mellitus without complications: Secondary | ICD-10-CM | POA: Diagnosis not present

## 2018-07-16 DIAGNOSIS — I10 Essential (primary) hypertension: Secondary | ICD-10-CM | POA: Diagnosis not present

## 2018-07-17 LAB — HEPATIC FUNCTION PANEL
ALT: 37 IU/L (ref 0–44)
AST: 58 IU/L — ABNORMAL HIGH (ref 0–40)
Albumin: 4.3 g/dL (ref 3.5–4.8)
Alkaline Phosphatase: 84 IU/L (ref 39–117)
Bilirubin Total: 0.8 mg/dL (ref 0.0–1.2)
Bilirubin, Direct: 0.19 mg/dL (ref 0.00–0.40)
Total Protein: 7.3 g/dL (ref 6.0–8.5)

## 2018-07-17 LAB — BASIC METABOLIC PANEL
BUN/Creatinine Ratio: 12 (ref 10–24)
BUN: 15 mg/dL (ref 8–27)
CO2: 24 mmol/L (ref 20–29)
Calcium: 9.2 mg/dL (ref 8.6–10.2)
Chloride: 103 mmol/L (ref 96–106)
Creatinine, Ser: 1.22 mg/dL (ref 0.76–1.27)
GFR calc Af Amer: 66 mL/min/{1.73_m2} (ref >59)
GFR calc non Af Amer: 57 mL/min/{1.73_m2} — ABNORMAL LOW (ref >59)
Glucose: 156 mg/dL — ABNORMAL HIGH (ref 65–99)
Potassium: 4.7 mmol/L (ref 3.5–5.2)
Sodium: 139 mmol/L (ref 134–144)

## 2018-07-17 LAB — LIPID PANEL
Chol/HDL Ratio: 6 ratio — ABNORMAL HIGH (ref 0.0–5.0)
Cholesterol, Total: 199 mg/dL (ref 100–199)
HDL: 33 mg/dL — ABNORMAL LOW (ref >39)
LDL Calculated: 145 mg/dL — ABNORMAL HIGH (ref 0–99)
Triglycerides: 103 mg/dL (ref 0–149)
VLDL Cholesterol Cal: 21 mg/dL (ref 5–40)

## 2018-07-17 LAB — HEMOGLOBIN A1C
Est. average glucose Bld gHb Est-mCnc: 171 mg/dL
Hgb A1c MFr Bld: 7.6 % — ABNORMAL HIGH (ref 4.8–5.6)

## 2018-07-25 ENCOUNTER — Encounter: Payer: Self-pay | Admitting: Family Medicine

## 2018-07-25 ENCOUNTER — Ambulatory Visit (INDEPENDENT_AMBULATORY_CARE_PROVIDER_SITE_OTHER): Payer: Medicare Other | Admitting: Family Medicine

## 2018-07-25 ENCOUNTER — Other Ambulatory Visit: Payer: Self-pay | Admitting: Family Medicine

## 2018-07-25 VITALS — BP 148/80 | Ht 72.0 in | Wt 187.0 lb

## 2018-07-25 DIAGNOSIS — M549 Dorsalgia, unspecified: Secondary | ICD-10-CM

## 2018-07-25 DIAGNOSIS — M545 Low back pain: Secondary | ICD-10-CM | POA: Diagnosis not present

## 2018-07-25 DIAGNOSIS — E785 Hyperlipidemia, unspecified: Secondary | ICD-10-CM

## 2018-07-25 DIAGNOSIS — G8929 Other chronic pain: Secondary | ICD-10-CM

## 2018-07-25 DIAGNOSIS — I714 Abdominal aortic aneurysm, without rupture, unspecified: Secondary | ICD-10-CM

## 2018-07-25 DIAGNOSIS — E119 Type 2 diabetes mellitus without complications: Secondary | ICD-10-CM

## 2018-07-25 DIAGNOSIS — R911 Solitary pulmonary nodule: Secondary | ICD-10-CM | POA: Diagnosis not present

## 2018-07-25 HISTORY — DX: Other chronic pain: G89.29

## 2018-07-25 MED ORDER — ZOSTER VAC RECOMB ADJUVANTED 50 MCG/0.5ML IM SUSR: INTRAMUSCULAR | 1 refills | Status: DC

## 2018-07-25 MED ORDER — HYDROCODONE-ACETAMINOPHEN 10-325 MG PO TABS: ORAL_TABLET | ORAL | 0 refills | Status: DC

## 2018-07-25 MED ORDER — METFORMIN HCL ER 500 MG PO TB24: 500.0000 mg | ORAL_TABLET | Freq: Every day | ORAL | 6 refills | Status: DC

## 2018-07-25 NOTE — Progress Notes (Signed)
Subjective:    Patient ID: Peter [name], male    DOB: 01-04-1941, 77 y.o.   MRN: [id number]  HPI  Chronic back pain intermittent sometimes radiates into the legs sometimes does not gabapentin seems to help would like to continue this also uses pain medication sporadically but denies using it frequently  Hyperlipidemia watch his diet does not take medicine because he felt it made him tired but patient was reviewed his lab results today and talked with him he understands the importance of taking medicine he will try every other day  Aortic aneurysm does need follow-up we offered to get him in with the vascular doctor in [place] but he would like to have the tests for follow-up done at the hospital  Patient has pulmonary nodules and smoking history does not smoke currently patient does need follow-up CAT scan by November  Patient is here today to follow up on chronic health issues.last A1c was done 07/16/2018 at 7.6. He states he is not eating healthy as he should. He does get exercise.He states he sees a heart Dr. Review of Systems  Constitutional: Negative for diaphoresis and fatigue.  HENT: Negative for congestion and rhinorrhea.   Respiratory: Negative for cough and shortness of breath.   Cardiovascular: Negative for chest pain and leg swelling.  Gastrointestinal: Negative for abdominal pain and diarrhea.  Skin: Negative for color change and rash.  Neurological: Negative for dizziness and headaches.  Psychiatric/Behavioral: Negative for behavioral problems and confusion.       Objective:   Physical Exam  Constitutional: He appears well-nourished. No distress.  HENT:  Head: Normocephalic and atraumatic.  Eyes: Right eye exhibits no discharge. Left eye exhibits no discharge.  Neck: No tracheal deviation present.  Cardiovascular: Normal rate, regular rhythm and normal heart sounds.  No murmur heard. Pulmonary/Chest: Effort normal and breath sounds normal. No respiratory  distress.  Musculoskeletal: He exhibits no edema.  Lymphadenopathy:    He has no cervical adenopathy.  Neurological: He is alert. Coordination normal.  Skin: Skin is warm and dry.  Psychiatric: He has a normal mood and affect. His behavior is normal.  Vitals reviewed.  25 minutes was spent with the patient.  This statement verifies that 25 minutes was indeed spent with the patient.  More than 50% of this visit-total duration of the visit-was spent in counseling and coordination of care. The issues that the patient came in for today as reflected in the diagnosis (s) please refer to documentation for further details.    Diabetic foot exam patient with mild neuropathy he does have some calluses and mild foot deformity    Assessment & Plan:  Very nice gentleman  Chronic pain-prescription given for the patient for pain medication he uses this intermittently he is very responsible with his usage he knows not to drive if he is feeling drowsy he will notify us if he needs more medicine otherwise follow-up in 3 to 4 months  Diabetes good control overall watch diet closely A1c under good control for age I would not recommend adding additional medicines we will use extended release metformin to help minimize diarrhea  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 The importance of healthy diet and regular physical activity was discussed. The importance of compliance with medications discussed.  Ideal goal is to keep blood pressure low elevated levels certainly below 478/41 when possible.  The patient was counseled that keeping blood pressure under control lessen his risk of  complications.  The importance of regular follow-ups was discussed with the patient.  Low-salt diet such as DASH recommended.  Regular physical activity was recommended as well.  Patient was advised to keep regular follow-ups.  Hyperlipidemia patient does agree to try using statins again he will take a half a tablet every other day if he tolerates this he will use a full tablet every day

## 2018-07-30 ENCOUNTER — Ambulatory Visit: Payer: Medicare Other | Admitting: Cardiology

## 2018-07-30 ENCOUNTER — Encounter: Payer: Self-pay | Admitting: Cardiology

## 2018-07-30 VITALS — BP 120/70 | HR 68 | Ht 71.0 in | Wt 187.6 lb

## 2018-07-30 DIAGNOSIS — I2510 Atherosclerotic heart disease of native coronary artery without angina pectoris: Secondary | ICD-10-CM

## 2018-07-30 DIAGNOSIS — I5022 Chronic systolic (congestive) heart failure: Secondary | ICD-10-CM | POA: Diagnosis not present

## 2018-07-30 DIAGNOSIS — Z79899 Other long term (current) drug therapy: Secondary | ICD-10-CM | POA: Diagnosis not present

## 2018-07-30 MED ORDER — SACUBITRIL-VALSARTAN 49-51 MG PO TABS: 1.0000 | ORAL_TABLET | Freq: Two times a day (BID) | ORAL | 6 refills | Status: DC

## 2018-07-30 NOTE — Patient Instructions (Signed)
Medication Instructions:  INCREASE ENTRESTO TO 49/51 MG TWO TIMES DAILY   Labwork: 2 WEEKS   BMET  Testing/Procedures: NONE  Follow-Up: Your physician recommends that you schedule a follow-up appointment in: [place] physician recommends that you schedule a follow-up appointment in: [place]     Any Other Special Instructions Will Be Listed Below (If Applicable).     If you need a refill on your cardiac medications before your next appointment, please call your pharmacy.

## 2018-07-30 NOTE — Progress Notes (Signed)
Clinical Summary Peter [name] is a 77 y.o.male seen today for a focused visit on his history of CAD and chronic systolic HF.   1. CAD/ICM/Chronic systolic HF - hx of prior CABG 1997 at [facility]  - stent to SVG-PDA and PTCA of the LAD in 05/2002. Normal LVEF at that time - 2008 heart cath [facility] had repeat stenting, he is unsure of the details - cath 2015 showing patent SVG-OM1-OM2, SVG-RI, SVG-D1, and LIMA-LAD with occluded SVG-PDA and severe stenosis of mid-LAD at LIMA insertion with DES placed),   - 04/2018 cath showed severe 3-vessel CAD with patent LIMA-LAD, patent stent along mid-LAD, patent SVG-OM1-OM2, and patent SVG-RI with occlusion of D1 and known occlusion of SVG-PDA. The diagonal occlusion was thought to be old, therefore continued medical therapy was recommended.  - 04/2018 echo LVEF 30-35%.   - no recent chest pain. No SOB or DOE. NO recent edema - compliant with meds - some perioribital numbness unsure if medication related.    Past Medical History:  Diagnosis Date  . AAA (abdominal aortic aneurysm) ([place])   . CAD (coronary artery disease)    a. s/p CABG in 1997 b. stent to SVG-PDA and PTCA of LAD in 2003 c. cath in 2015 showing patent SVG-OM1-OM2, SVG-RI, SVG-D1, and LIMA-LAD with occluded SVG-PDA and severe stenosis of mid-LAD at LIMA insertion with DES placed d. 04/2018: similar results to 2015 with patent LAD stent; occlusion of small caliber D1 stenosis with medical management recommended.   . Chronic back pain 07/25/2018   Chronic lumbar pain uses hydrocodone intermittently  . COPD (chronic obstructive pulmonary disease) ([place])   . Diabetes mellitus    diet controlled  . Dyslipidemia   . Hypertension   . Myocardial infarction ([place]) 1997   stents x2 (2003/2007)     Allergies  Allergen Reactions  . Dye Fdc Red [Red Dye]     hives  . Ivp Dye [Iodinated Diagnostic Agents] Hives  . Keflex [Cephalexin]     hoarsness  . Plavix [Clopidogrel] Hives      Unsure if dye or Plavix     Current Outpatient Medications  Medication Sig Dispense Refill  . ACCU-CHEK FASTCLIX LANCETS MISC USE 1  TO CHECK GLUCOSE ONCE DAILY AS DIRECTED 102 each 5  . aspirin EC 325 MG EC tablet Take 1 tablet (325 mg total) by mouth daily. 30 tablet 0  . atorvastatin (LIPITOR) 80 MG tablet Take 1 tablet (80 mg total) by mouth daily at 6 PM. 30 tablet 0  . gabapentin (NEURONTIN) 100 MG capsule Take 1 capsule (100 mg total) by mouth 3 (three) times daily. 90 capsule 3  . HYDROcodone-acetaminophen (NORCO) 10-325 MG tablet 1 bid prn pain 30 tablet 0  . ketoconazole (NIZORAL) 2 % cream Apply 1 application topically 2 (two) times daily. APPLY BID TO RASH IN GROIN AREA 15 g 0  . metFORMIN (GLUCOPHAGE-XR) 500 MG 24 hr tablet Take 1 tablet (500 mg total) by mouth daily with breakfast. 30 tablet 6  . metoprolol succinate (TOPROL-XL) 25 MG 24 hr tablet Take 1 tablet (25 mg total) by mouth daily. 30 tablet 5  . nitroGLYCERIN (NITROSTAT) 0.4 MG SL tablet Place 1 tablet (0.4 mg total) under the tongue every 5 (five) minutes as needed for chest pain. 30 tablet 0  . pantoprazole (PROTONIX) 40 MG tablet Take 1 tablet (40 mg total) by mouth daily. 30 tablet 0  . sacubitril-valsartan (ENTRESTO) 24-26 MG Take 1 tablet by  mouth 2 (two) times daily. 180 tablet 3  . traMADol (ULTRAM) 50 MG tablet 1 bid prn 30 tablet 1  . Zoster Vaccine Adjuvanted [facility]) injection Inject times one dose 0.5 mL 1   No current facility-administered medications for this visit.      Past Surgical History:  Procedure Laterality Date  . CORONARY ANGIOPLASTY  2003/2007   2 stents  . CORONARY ANGIOPLASTY WITH STENT PLACEMENT  08/14/14   DES to LIMA-LAD insertion  . CORONARY ARTERY BYPASS GRAFT  1997   7 vessels  . CORONARY STENT PLACEMENT  08/15/2014   MID LAD  DES  by Dr Burt Knack  . CYSTECTOMY  2005   top of head-Jenkins  . EAR CYST EXCISION  07/09/2012   Procedure: CYST REMOVAL;  Surgeon: Jamesetta So,  MD;  Location: [facility];  Service: General;  Laterality: N/A;  . LEFT HEART CATH AND CORS/GRAFTS ANGIOGRAPHY N/A 04/16/2018   Procedure: LEFT HEART CATH AND CORS/GRAFTS ANGIOGRAPHY;  Surgeon: Martinique, Peter M, MD;  Location: [place] CV LAB;  Service: Cardiovascular;  Laterality: N/A;  . LEFT HEART CATHETERIZATION WITH CORONARY/GRAFT ANGIOGRAM N/A 08/14/2014   Procedure: LEFT HEART CATHETERIZATION WITH Beatrix Fetters;  Surgeon: Blane Ohara, MD;  Location: [facility] CATH LAB;  Service: Cardiovascular;  Laterality: N/A;     Allergies  Allergen Reactions  . Dye Fdc Red [Red Dye]     hives  . Ivp Dye [Iodinated Diagnostic Agents] Hives  . Keflex [Cephalexin]     hoarsness  . Plavix [Clopidogrel] Hives    Unsure if dye or Plavix      Family History  Problem Relation Age of Onset  . Other Father        deceased after ?TCS or barium enema, age 54s  . Hypertension Father   . Heart attack Mother   . Colon cancer Neg Hx   . Liver disease Neg Hx      Social History Peter [name] reports that he quit smoking about 18 years ago. His smoking use included cigarettes. He has a 120.00 pack-year smoking history. He has never used smokeless tobacco. Peter [name] reports that he does not drink alcohol.   Review of Systems CONSTITUTIONAL: No weight loss, fever, chills, weakness or fatigue.  HEENT: Eyes: No visual loss, blurred vision, double vision or yellow sclerae.No hearing loss, sneezing, congestion, runny nose or sore throat.  SKIN: No rash or itching.  CARDIOVASCULAR: per hpi RESPIRATORY: No shortness of breath, cough or sputum.  GASTROINTESTINAL: No anorexia, nausea, vomiting or diarrhea. No abdominal pain or blood.  GENITOURINARY: No burning on urination, no polyuria NEUROLOGICAL: No headache, dizziness, syncope, paralysis, ataxia, numbness or tingling in the extremities. No change in bowel or bladder control.  MUSCULOSKELETAL: No muscle, back pain, joint pain or stiffness.    LYMPHATICS: No enlarged nodes. No history of splenectomy.  PSYCHIATRIC: No history of depression or anxiety.  ENDOCRINOLOGIC: No reports of sweating, cold or heat intolerance. No polyuria or polydipsia.  Marland Kitchen   Physical Examination Vitals:   07/30/18 1522  BP: 120/70  Pulse: 68  SpO2: 97%   Vitals:   07/30/18 1522  Weight: 187 lb 9.6 oz (85.1 kg)  Height: 5\' 11"  (1.803 m)    Gen: resting comfortably, no acute distress HEENT: no scleral icterus, pupils equal round and reactive, no palptable cervical adenopathy,  CV: RRR, no mrg, no jvd Resp: Clear to auscultation bilaterally GI: abdomen is soft, non-tender, non-distended, normal bowel sounds, no hepatosplenomegaly MSK: extremities are  warm, no edema.  Skin: warm, no rash Neuro:  no focal deficits Psych: appropriate affect     Assessment and Plan   1. CAD/ICM/Chronic systolic heart - no recent symptoms, appears euvolemic - we will increase entresto to 49/51mg  bid, BMET in 2 weeks, nursing visit in 2 weeks - if labs and vitals stable, increase entresto further.    F/u 6 weeks.      Arnoldo Lenis, M.D.

## 2018-07-31 MED FILL — HYDROCODON-APAP 10-325: 10-325 | 15 days supply | Qty: 30 | Fill #0

## 2018-08-02 ENCOUNTER — Telehealth: Payer: Self-pay | Admitting: *Deleted

## 2018-08-02 NOTE — Telephone Encounter (Signed)
Spoke with Harl Favor. With [facility] who states that pt does not have part D insurance with them or  [facility]

## 2018-08-13 ENCOUNTER — Ambulatory Visit (HOSPITAL_COMMUNITY): Admission: RE | Admit: 2018-08-13 | Payer: Medicare Other | Source: Ambulatory Visit

## 2018-08-13 ENCOUNTER — Other Ambulatory Visit (HOSPITAL_COMMUNITY)
Admission: RE | Admit: 2018-08-13 | Discharge: 2018-08-13 | Disposition: A | Discharge status: Home / Self Care | Payer: Medicare Other | Source: Ambulatory Visit | Attending: Cardiology | Admitting: Cardiology

## 2018-08-13 DIAGNOSIS — Z79899 Other long term (current) drug therapy: Secondary | ICD-10-CM | POA: Insufficient documentation

## 2018-08-13 LAB — BASIC METABOLIC PANEL
Anion gap: 7 (ref 5–15)
BUN: 16 mg/dL (ref 8–23)
CO2: 28 mmol/L (ref 22–32)
Calcium: 9 mg/dL (ref 8.9–10.3)
Chloride: 103 mmol/L (ref 98–111)
Creatinine, Ser: 1.2 mg/dL (ref 0.61–1.24)
GFR calc Af Amer: >60 mL/min (ref >60)
GFR calc non Af Amer: 57 mL/min — ABNORMAL LOW (ref >60)
Glucose, Bld: 168 mg/dL — ABNORMAL HIGH (ref 70–99)
Potassium: 4.1 mmol/L (ref 3.5–5.1)
Sodium: 138 mmol/L (ref 135–145)

## 2018-08-14 ENCOUNTER — Ambulatory Visit (INDEPENDENT_AMBULATORY_CARE_PROVIDER_SITE_OTHER): Payer: Medicare Other

## 2018-08-14 VITALS — BP 126/78 | HR 80

## 2018-08-14 DIAGNOSIS — Z0130 Encounter for examination of blood pressure without abnormal findings: Secondary | ICD-10-CM

## 2018-08-14 NOTE — Patient Instructions (Addendum)
Continue your medications as usual, unless we call you.      Thank you for choosing [facility] !

## 2018-08-14 NOTE — Progress Notes (Signed)
Pt states he feels well, no complaints with increased dose of Entresto

## 2018-08-15 ENCOUNTER — Telehealth: Payer: Self-pay

## 2018-08-15 DIAGNOSIS — Z79899 Other long term (current) drug therapy: Secondary | ICD-10-CM

## 2018-08-15 MED ORDER — SACUBITRIL-VALSARTAN 97-103 MG PO TABS: 1.0000 | ORAL_TABLET | Freq: Two times a day (BID) | ORAL | 3 refills | Status: DC

## 2018-08-15 NOTE — Telephone Encounter (Signed)
I faxed new rx to [facility] assistance [phone] for entresto 97/103 mg BID  BP apt made for 9/25 at 4 pm for BP check

## 2018-08-15 NOTE — Addendum Note (Signed)
Addended by: Barbarann Ehlers A on: 08/15/2018 10:57 AM   Modules accepted: Orders

## 2018-08-15 NOTE — Telephone Encounter (Addendum)
-----   Message from Arnoldo Lenis, MD sent at 08/15/2018  9:59 AM EDT -----   ----- Message ----- From: Bernita Raisin, RN Sent: 08/14/2018   4:20 PM EDT To: Arnoldo Lenis, MD    Please have him increase his entresto to 97/103mg  bid, repeat BMET in [redacted] weeks along with nursing visit for vitals check at that time.    Zandra Abts MD

## 2018-08-15 NOTE — Progress Notes (Signed)
Please have him increase his entresto to 97/103mg  bid, repeat BMET in [redacted] weeks along with nursing visit for vitals check at that time.    Zandra Abts MD

## 2018-08-27 ENCOUNTER — Ambulatory Visit (HOSPITAL_COMMUNITY)
Admission: RE | Admit: 2018-08-27 | Discharge: 2018-08-27 | Disposition: A | Discharge status: Home / Self Care | Payer: Medicare Other | Source: Ambulatory Visit | Attending: Family Medicine | Admitting: Family Medicine

## 2018-08-27 DIAGNOSIS — I714 Abdominal aortic aneurysm, without rupture, unspecified: Secondary | ICD-10-CM

## 2018-08-28 ENCOUNTER — Ambulatory Visit: Payer: Medicare Other | Admitting: Diagnostic Neuroimaging

## 2018-08-29 ENCOUNTER — Ambulatory Visit: Payer: Medicare Other

## 2018-09-03 ENCOUNTER — Other Ambulatory Visit: Payer: Self-pay

## 2018-09-03 DIAGNOSIS — I714 Abdominal aortic aneurysm, without rupture, unspecified: Secondary | ICD-10-CM

## 2018-09-05 ENCOUNTER — Ambulatory Visit: Payer: Medicare Other

## 2018-09-06 ENCOUNTER — Other Ambulatory Visit (HOSPITAL_COMMUNITY)
Admission: RE | Admit: 2018-09-06 | Discharge: 2018-09-06 | Disposition: A | Discharge status: Home / Self Care | Payer: Medicare Other | Source: Ambulatory Visit | Attending: Cardiology | Admitting: Cardiology

## 2018-09-06 ENCOUNTER — Ambulatory Visit (INDEPENDENT_AMBULATORY_CARE_PROVIDER_SITE_OTHER): Payer: Medicare Other

## 2018-09-06 VITALS — BP 140/78 | HR 74 | Wt 190.0 lb

## 2018-09-06 DIAGNOSIS — Z79899 Other long term (current) drug therapy: Secondary | ICD-10-CM | POA: Insufficient documentation

## 2018-09-06 DIAGNOSIS — Z0130 Encounter for examination of blood pressure without abnormal findings: Secondary | ICD-10-CM

## 2018-09-06 LAB — BASIC METABOLIC PANEL
Anion gap: 6 (ref 5–15)
BUN: 16 mg/dL (ref 8–23)
CO2: 25 mmol/L (ref 22–32)
Calcium: 8.8 mg/dL — ABNORMAL LOW (ref 8.9–10.3)
Chloride: 103 mmol/L (ref 98–111)
Creatinine, Ser: 1.25 mg/dL — ABNORMAL HIGH (ref 0.61–1.24)
GFR calc Af Amer: >60 mL/min (ref >60)
GFR calc non Af Amer: 54 mL/min — ABNORMAL LOW (ref >60)
Glucose, Bld: 213 mg/dL — ABNORMAL HIGH (ref 70–99)
Potassium: 3.7 mmol/L (ref 3.5–5.1)
Sodium: 134 mmol/L — ABNORMAL LOW (ref 135–145)

## 2018-09-06 NOTE — Progress Notes (Signed)
Patient has only been on on Entresto 97/103 for a week.Has f/u next week with Rosaria Ferries

## 2018-09-06 NOTE — Patient Instructions (Addendum)
We will call you with any medication changes.     Take blood pressure daily and note on BP log I gave you.    We will see you next week at apt on 09/11/18 at 1 pm

## 2018-09-10 NOTE — Progress Notes (Signed)
Labs and vitals ok, reevaluate tomorrow with PA visit, no changes today   Zandra Abts MD

## 2018-09-11 ENCOUNTER — Ambulatory Visit: Payer: Medicare Other | Admitting: Physician Assistant

## 2018-09-11 ENCOUNTER — Encounter: Payer: Self-pay | Admitting: Physician Assistant

## 2018-09-11 VITALS — BP 138/72 | HR 75 | Ht 72.0 in | Wt 188.6 lb

## 2018-09-11 DIAGNOSIS — E785 Hyperlipidemia, unspecified: Secondary | ICD-10-CM | POA: Diagnosis not present

## 2018-09-11 DIAGNOSIS — Z79899 Other long term (current) drug therapy: Secondary | ICD-10-CM

## 2018-09-11 DIAGNOSIS — E1169 Type 2 diabetes mellitus with other specified complication: Secondary | ICD-10-CM | POA: Diagnosis not present

## 2018-09-11 DIAGNOSIS — I2510 Atherosclerotic heart disease of native coronary artery without angina pectoris: Secondary | ICD-10-CM | POA: Diagnosis not present

## 2018-09-11 DIAGNOSIS — I5042 Chronic combined systolic (congestive) and diastolic (congestive) heart failure: Secondary | ICD-10-CM

## 2018-09-11 MED ORDER — ASPIRIN EC 81 MG PO TBEC: 81.0000 mg | DELAYED_RELEASE_TABLET | Freq: Every day | ORAL | 3 refills | Status: DC

## 2018-09-11 NOTE — Patient Instructions (Signed)
Medication Instructions:  Your physician has recommended you make the following change in your medication:  Decrease Aspirin to 81 mg Daily   If you need a refill on your cardiac medications before your next appointment, please call your pharmacy.   Lab work: NONE  If you have labs (blood work) drawn today and your tests are completely normal, you will receive your results only by: Marland Kitchen MyChart Message (if you have MyChart) OR . A paper copy in the mail If you have any lab test that is abnormal or we need to change your treatment, we will call you to review the results.  Testing/Procedures: NONE    Follow-Up: At [facility], you and your health needs are our priority.  As part of our continuing mission to provide you with exceptional heart care, we have created designated Provider Care Teams.  These Care Teams include your primary Cardiologist (physician) and Advanced Practice Providers (APPs -  Physician Assistants and Nurse Practitioners) who all work together to provide you with the care you need, when you need it. You will need a follow up appointment in 6 months.  Please call our office 2 months in advance to schedule this appointment.  You may see Carlyle Dolly, MD or one of the following Advanced Practice Providers on your designated Care Team:   Bernerd Pho, PA-C [facility]) . Ermalinda Barrios, PA-C ([place])  Any Other Special Instructions Will Be Listed Below (If Applicable).  The lipid clinic will contact you.   Thank you for choosing [facility]!

## 2018-09-11 NOTE — Progress Notes (Signed)
Cardiology Office Note   Date:  09/11/2018   ID:  Peter [name], DOB [birth year], MRN [id number]  PCP:  Kathyrn Drown, MD  Cardiologist:  Carlyle Dolly, MD 07/30/2018 Rosaria Ferries, PA-C   No chief complaint on file.   History of Present Illness: Peter [name] is a 77 y.o. 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  He quit drinking 2 years ago, struggles with a sweet tooth.   He has not taken the Lipitor in a while. He does not tolerate it, got diarrhea from it.   No LE edema, no DOE, no orthopnea or PND.   No chest pain.   Has been coughing more in the last 6 months.   He gets stomach burning from the sugars. He drinks aloe vera juice and it helps his stomach.   He takes full-strength ASA intermittently, started it recently.    Past Medical History:  Diagnosis Date  . AAA (abdominal aortic aneurysm) ([place])   . CAD (coronary artery disease)    a. s/p CABG in 1997 b. stent to SVG-PDA and PTCA of LAD in 2003 c. cath in 2015 showing patent SVG-OM1-OM2, SVG-RI, SVG-D1, and LIMA-LAD with occluded SVG-PDA and severe stenosis of mid-LAD at LIMA insertion with DES placed d. 04/2018: similar results to 2015 with patent LAD stent; occlusion of small caliber D1 stenosis with medical management recommended.   . Chronic back pain 07/25/2018   Chronic lumbar pain uses hydrocodone intermittently  . Chronic systolic CHF (congestive heart failure) ([place])   . COPD (chronic obstructive pulmonary disease) ([place])   . Diabetes mellitus    diet controlled  . Dyslipidemia   . Hypertension   . Myocardial infarction [facility]) 1997   stents x2 (2003/2007)    Past Surgical History:  Procedure Laterality Date  . CORONARY ANGIOPLASTY  2003/2007   2 stents    . CORONARY ANGIOPLASTY WITH STENT PLACEMENT  08/14/14   DES to LIMA-LAD insertion  . CORONARY ARTERY BYPASS GRAFT  1997   7 vessels  . CORONARY STENT PLACEMENT  08/15/2014   MID LAD  DES  by Dr Burt Knack  . CYSTECTOMY  2005   top of head-[facility]  . EAR CYST EXCISION  07/09/2012   Procedure: CYST REMOVAL;  Surgeon: Jamesetta So, MD;  Location: [facility];  Service: General;  Laterality: N/A;  . LEFT HEART CATH AND CORS/GRAFTS ANGIOGRAPHY N/A 04/16/2018   Procedure: LEFT HEART CATH AND CORS/GRAFTS ANGIOGRAPHY;  Surgeon: Martinique, Peter M, MD;  Location: [place] CV LAB;  Service: Cardiovascular;  Laterality: N/A;  . LEFT HEART CATHETERIZATION WITH CORONARY/GRAFT ANGIOGRAM N/A 08/14/2014   Procedure: LEFT HEART CATHETERIZATION WITH Beatrix Fetters;  Surgeon: Blane Ohara, MD;  Location: [facility] CATH LAB;  Service: Cardiovascular;  Laterality: N/A;    Current Outpatient Medications  Medication Sig Dispense Refill  . ACCU-CHEK FASTCLIX LANCETS MISC USE 1  TO CHECK GLUCOSE ONCE DAILY AS DIRECTED 102 each 5  . aspirin EC 325 MG EC tablet Take 1 tablet (325 mg total) by mouth daily. 30 tablet 0  . atorvastatin (LIPITOR) 80 MG tablet Take 1 tablet (80 mg total) by mouth daily at 6 PM. 30 tablet 0  . gabapentin (NEURONTIN) 100 MG  capsule Take 1 capsule (100 mg total) by mouth 3 (three) times daily. 90 capsule 3  . HYDROcodone-acetaminophen (NORCO) 10-325 MG tablet 1 bid prn pain 30 tablet 0  . ketoconazole (NIZORAL) 2 % cream Apply 1 application topically 2 (two) times daily. APPLY BID TO RASH IN GROIN AREA 15 g 0  . metFORMIN (GLUCOPHAGE-XR) 500 MG 24 hr tablet Take 1 tablet (500 mg total) by mouth daily with breakfast. 30 tablet 6  . metoprolol succinate (TOPROL-XL) 25 MG 24 hr tablet Take 1 tablet (25 mg total) by mouth daily. 30 tablet 5  . nitroGLYCERIN (NITROSTAT) 0.4 MG SL tablet Place 1 tablet (0.4 mg total) under the tongue every 5 (five) minutes as needed for chest pain. 30 tablet 0  .  pantoprazole (PROTONIX) 40 MG tablet Take 1 tablet (40 mg total) by mouth daily. 30 tablet 0  . sacubitril-valsartan (ENTRESTO) 97-103 MG Take 1 tablet by mouth 2 (two) times daily. 60 tablet 3  . traMADol (ULTRAM) 50 MG tablet 1 bid prn 30 tablet 1  . Zoster Vaccine Adjuvanted [facility]) injection Inject times one dose 0.5 mL 1   No current facility-administered medications for this visit.     Allergies:   Dye fdc red [red dye]; Ivp dye [iodinated diagnostic agents]; Keflex [cephalexin]; and Plavix [clopidogrel]    Social History:  The patient  reports that he quit smoking about 18 years ago. His smoking use included cigarettes. He has a 120.00 pack-year smoking history. He has never used smokeless tobacco. He reports that he does not drink alcohol or use drugs.   Family History:  The patient's family history includes Heart attack in his mother; Hypertension in his father; Other in his father.    ROS:  Please see the history of present illness. All other systems are reviewed and negative.    PHYSICAL EXAM: VS:  BP 138/72   Pulse 75   Ht 6' (1.829 m)   Wt 188 lb 9.6 oz (85.5 kg)   SpO2 99%   BMI 25.58 kg/m  , BMI Body mass index is 25.58 kg/m. GEN: Well nourished, well developed, male in no acute distress  HEENT: normal for age  Neck: no JVD, no carotid bruit, no masses Cardiac: RRR; soft murmur, no rubs, or gallops Respiratory:  clear to auscultation bilaterally, normal work of breathing GI: soft, nontender, nondistended, + BS MS: no deformity or atrophy; no edema; distal pulses are 2+ in all 4 extremities   Skin: warm and dry, no rash Neuro:  Strength and sensation are intact Psych: euthymic mood, full affect   EKG:  EKG is not ordered today.  Recent Labs: 04/12/2018: Magnesium 1.8 04/17/2018: Hemoglobin 14.1; Platelets 126 07/16/2018: ALT 37 09/06/2018: BUN 16; Creatinine, Ser 1.25; Potassium 3.7; Sodium 134    Lipid Panel    Component Value Date/Time   CHOL 199  07/16/2018 1149   TRIG 103 07/16/2018 1149   HDL 33 (L) 07/16/2018 1149   CHOLHDL 6.0 (H) 07/16/2018 1149   CHOLHDL 6.8 04/13/2018 0512   VLDL 30 04/13/2018 0512   LDLCALC 145 (H) 07/16/2018 1149     Wt Readings from Last 3 Encounters:  09/11/18 188 lb 9.6 oz (85.5 kg)  09/06/18 190 lb (86.2 kg)  07/30/18 187 lb 9.6 oz (85.1 kg)     Other studies Reviewed: Additional studies/ records that were reviewed today include: office notes, hospital records and testing.   ASSESSMENT AND PLAN:  1.  CAD: no ischemic sx, no testing  needed. - start ASA 81 mg and take daily - continue BB, BP a little high today but he is reluctant to make changes, hold off for now.  2. Chronic CHF - volume status good by exam - not currently on diuretic - continue Entresto, BB  3. Hyperlipidemia - goal LDL < 70 - refer to Lipid clinic for the study of injectable medications, he may qualify - if not able to do that, start weaker statin qod or so and see how tolerated.   Current medicines are reviewed at length with the patient today.  The patient has concerns regarding medicines. Concerns were addressed  The following changes have been made:  Decrease ASA, refer to Lipid clinic  Labs/ tests ordered today include:  No orders of the defined types were placed in this encounter.    Disposition:   FU with Carlyle Dolly, MD   Signed, Rosaria Ferries, PA-C  09/11/2018 1:21 PM    [place] Phone: [phone]; Fax: [phone]  This note was written with the assistance of speech recognition software. Please excuse any transcriptional errors.

## 2018-10-17 ENCOUNTER — Ambulatory Visit (HOSPITAL_COMMUNITY)
Admission: RE | Admit: 2018-10-17 | Discharge: 2018-10-17 | Disposition: A | Discharge status: Home / Self Care | Payer: Medicare Other | Source: Ambulatory Visit | Attending: Family Medicine | Admitting: Family Medicine

## 2018-10-17 DIAGNOSIS — I700 Atherosclerosis of aorta: Secondary | ICD-10-CM | POA: Diagnosis not present

## 2018-10-17 DIAGNOSIS — R918 Other nonspecific abnormal finding of lung field: Secondary | ICD-10-CM | POA: Diagnosis not present

## 2018-10-17 DIAGNOSIS — Z87891 Personal history of nicotine dependence: Secondary | ICD-10-CM | POA: Insufficient documentation

## 2018-10-17 DIAGNOSIS — J449 Chronic obstructive pulmonary disease, unspecified: Secondary | ICD-10-CM | POA: Insufficient documentation

## 2018-10-17 DIAGNOSIS — R911 Solitary pulmonary nodule: Secondary | ICD-10-CM | POA: Diagnosis not present

## 2018-10-26 ENCOUNTER — Ambulatory Visit: Payer: Medicare Other | Admitting: Family Medicine

## 2018-10-30 ENCOUNTER — Ambulatory Visit (INDEPENDENT_AMBULATORY_CARE_PROVIDER_SITE_OTHER): Payer: Medicare Other | Admitting: Family Medicine

## 2018-10-30 ENCOUNTER — Encounter: Payer: Self-pay | Admitting: Family Medicine

## 2018-10-30 VITALS — BP 130/86 | Ht 72.0 in | Wt 189.4 lb

## 2018-10-30 DIAGNOSIS — E1351 Other specified diabetes mellitus with diabetic peripheral angiopathy without gangrene: Secondary | ICD-10-CM

## 2018-10-30 DIAGNOSIS — M545 Low back pain: Secondary | ICD-10-CM | POA: Diagnosis not present

## 2018-10-30 DIAGNOSIS — E1169 Type 2 diabetes mellitus with other specified complication: Secondary | ICD-10-CM

## 2018-10-30 DIAGNOSIS — E785 Hyperlipidemia, unspecified: Secondary | ICD-10-CM

## 2018-10-30 DIAGNOSIS — J449 Chronic obstructive pulmonary disease, unspecified: Secondary | ICD-10-CM

## 2018-10-30 DIAGNOSIS — E119 Type 2 diabetes mellitus without complications: Secondary | ICD-10-CM | POA: Diagnosis not present

## 2018-10-30 DIAGNOSIS — I10 Essential (primary) hypertension: Secondary | ICD-10-CM | POA: Diagnosis not present

## 2018-10-30 DIAGNOSIS — G8929 Other chronic pain: Secondary | ICD-10-CM

## 2018-10-30 LAB — POCT GLYCOSYLATED HEMOGLOBIN (HGB A1C): Hemoglobin A1C: 7.5 % — AB (ref 4.0–5.6)

## 2018-10-30 MED ORDER — ATORVASTATIN CALCIUM 80 MG PO TABS: ORAL_TABLET | ORAL | 12 refills | Status: DC

## 2018-10-30 MED ORDER — HYDROCODONE-ACETAMINOPHEN 10-325 MG PO TABS: ORAL_TABLET | ORAL | 0 refills | Status: DC

## 2018-10-30 MED ORDER — AMOXICILLIN 500 MG PO TABS: 500.0000 mg | ORAL_TABLET | Freq: Three times a day (TID) | ORAL | 0 refills | Status: DC

## 2018-10-30 NOTE — Progress Notes (Signed)
Subjective:    Patient ID: Peter [name], male    DOB: October 01, 1941, 77 y.o.   MRN: [id number]  Diabetes  He presents for his follow-up diabetic visit. He has type 2 diabetes mellitus. There are no hypoglycemic associated symptoms. Pertinent negatives for hypoglycemia include no confusion, dizziness or headaches. There are no diabetic associated symptoms. Pertinent negatives for diabetes include no chest pain and no fatigue. There are no hypoglycemic complications. There are no diabetic complications. He does not see a podiatrist.Eye exam is current.   This patient was seen today for chronic pain Results for orders placed or performed in visit on 10/30/18  POCT HgB A1C  Result Value Ref Range   Hemoglobin A1C 7.5 (A) 4.0 - 5.6 %   HbA1c POC (<> result, manual entry)     HbA1c, POC (prediabetic range)     HbA1c, POC (controlled diabetic range)      The medication list was reviewed and updated.   -Compliance with medication: yes  - Number patient states they take daily: 2  -when was the last dose patient took? today  The patient was advised the importance of maintaining medication and not using illegal substances with these.  Here for refills and follow up  The patient was educated that we can provide 3 monthly scripts for their medication, it is their responsibility to follow the instructions.  Side effects or complications from medications: none  Patient is aware that pain medications are meant to minimize the severity of the pain to allow their pain levels to improve to allow for better function. They are aware of that pain medications cannot totally remove their pain.  Due for UDT ( at least once per year) : Not indicated does not use medication frequently is reliable  Poor control of his diabetes he does admit to eating foods that are not quite healthy for diabetes he does state that he will try harder and he will stay active he does not want to be on any medications  whatsoever  He has stable heart disease denies any chest tightness pressure pain  Dyslipidemia not taking statin states he does not want to take it because he is afraid it will make him feel bad we talked about at length he agrees to trying it 2 days a week  COPD on his CAT scan but he denies any significant shortness of breath and does not want to be on any inhalers does not smoke currently  Chronic low back pain uses pain medication here and there but not frequently is reliable with his medicine  Results for orders placed or performed in visit on 10/30/18  POCT HgB A1C  Result Value Ref Range   Hemoglobin A1C 7.5 (A) 4.0 - 5.6 %   HbA1c POC (<> result, manual entry)     HbA1c, POC (prediabetic range)     HbA1c, POC (controlled diabetic range)         Review of Systems  Constitutional: Negative for diaphoresis and fatigue.  HENT: Negative for congestion and rhinorrhea.   Respiratory: Negative for cough and shortness of breath.   Cardiovascular: Negative for chest pain and leg swelling.  Gastrointestinal: Negative for abdominal pain and diarrhea.  Skin: Negative for color change and rash.  Neurological: Negative for dizziness and headaches.  Psychiatric/Behavioral: Negative for behavioral problems and confusion.       Objective:   Physical Exam  Constitutional: He appears well-nourished. No distress.  HENT:  Head: Normocephalic and atraumatic.  Eyes: Right eye exhibits no discharge. Left eye exhibits no discharge.  Neck: No tracheal deviation present.  Cardiovascular: Normal rate, regular rhythm and normal heart sounds.  No murmur heard. Pulmonary/Chest: Effort normal and breath sounds normal. No respiratory distress.  Musculoskeletal: He exhibits no edema.  Lymphadenopathy:    He has no cervical adenopathy.  Neurological: He is alert. Coordination normal.  Skin: Skin is warm and dry.  Psychiatric: He has a normal mood and affect. His behavior is normal.  Vitals  reviewed.   Very nice pt Issues with DM HTN LIP      Assessment & Plan:  Diabetes-subpar control.  A1c is 7.5 would like to see it at 7 or less.  Patient states he can do better job with carbs in his diet.  I talked with him regarding diabetes medicine.  He does not want to be on any diabetes medicine he feels he can keep it under control with diet.  We will allow this for the next 3 months and recheck the A1c if it is gone up we will encourage him again to be on medication  Dyslipidemia with peripheral vascular disease and heart disease- patient would benefit from being on a cholesterol medicine he has not been taking it recently he agrees to try taking it on Mondays and Fridays, obviously it would be best to take it every single day but the fact that he will take it 2 days a week is improvement over what he was doing.  COPD currently he denies being short of breath with activity he does not want to be on any type of inhalers currently unless this condition worsens  Dyslipidemia is associated with his diabetes  25 minutes was spent with the patient.  This statement verifies that 25 minutes was indeed spent with the patient.  More than 50% of this visit-total duration of the visit-was spent in counseling and coordination of care. The issues that the patient came in for today as reflected in the diagnosis (s) please refer to documentation for further details.   Chronic low back pain- pain medication given he does not use it frequently if he needs another prescription between now and 3 months from now he will notify us he is responsible with his medication drug registry was checked  His diabetes is contributing to his heart disease peripheral vascular disease as well as dyslipidemia  Patient refuses flu shot

## 2018-12-03 MED FILL — HYDROCODON-APAP 10-325: 10-325 | 15 days supply | Qty: 30 | Fill #0

## 2018-12-03 MED FILL — AMOXICILLIN 500 MG CAPSULE: 500 | 10 days supply | Qty: 30 | Fill #0

## 2018-12-14 ENCOUNTER — Ambulatory Visit: Payer: Medicare Other | Admitting: Family

## 2018-12-14 ENCOUNTER — Other Ambulatory Visit (HOSPITAL_COMMUNITY): Payer: Medicare Other

## 2019-01-30 ENCOUNTER — Ambulatory Visit (INDEPENDENT_AMBULATORY_CARE_PROVIDER_SITE_OTHER): Payer: Medicare Other | Admitting: Family Medicine

## 2019-01-30 ENCOUNTER — Encounter: Payer: Self-pay | Admitting: Family Medicine

## 2019-01-30 VITALS — BP 134/80 | Ht 72.0 in | Wt 191.4 lb

## 2019-01-30 DIAGNOSIS — I739 Peripheral vascular disease, unspecified: Secondary | ICD-10-CM

## 2019-01-30 DIAGNOSIS — Z79899 Other long term (current) drug therapy: Secondary | ICD-10-CM | POA: Diagnosis not present

## 2019-01-30 DIAGNOSIS — I714 Abdominal aortic aneurysm, without rupture, unspecified: Secondary | ICD-10-CM

## 2019-01-30 DIAGNOSIS — E1151 Type 2 diabetes mellitus with diabetic peripheral angiopathy without gangrene: Secondary | ICD-10-CM

## 2019-01-30 DIAGNOSIS — E785 Hyperlipidemia, unspecified: Secondary | ICD-10-CM

## 2019-01-30 DIAGNOSIS — E1142 Type 2 diabetes mellitus with diabetic polyneuropathy: Secondary | ICD-10-CM

## 2019-01-30 LAB — POCT GLYCOSYLATED HEMOGLOBIN (HGB A1C): Hemoglobin A1C: 5.9 % — AB (ref 4.0–5.6)

## 2019-01-30 MED ORDER — HYDROCODONE-ACETAMINOPHEN 10-325 MG PO TABS: ORAL_TABLET | ORAL | 0 refills | Status: DC

## 2019-01-30 NOTE — Progress Notes (Signed)
Subjective:    Patient ID: Peter [name], male    DOB: August 09, 1941, 78 y.o.   MRN: [id number]  Diabetes  He presents for his follow-up diabetic visit. He has type 2 diabetes mellitus. Pertinent negatives for hypoglycemia include no confusion, dizziness or headaches. Pertinent negatives for diabetes include no chest pain and no fatigue. He is compliant with treatment all of the time. Home blood sugar record trend: not over 175. He does not see a podiatrist.Eye exam is not current (about one year ago).   Needs refill on hydrocodone10/325. Does not take every day. Last script was in November.  Overall patient doing fairly well with taking the pain medicine uses it infrequently  Patient does have heart failure but has been unable to tolerate Entresto he states he will be going by cardiology to talk with them regarding this issue  Step on something a few days ago and has a cut on bottom of left foot.  Denies any severe swelling in it.  But does relate numbness in his feet he also relates pain and discomfort in his feet in addition to this he also states he has trouble with his toes getting dusky appearing  Results for orders placed or performed in visit on 01/30/19  POCT glycosylated hemoglobin (Hb A1C)  Result Value Ref Range   Hemoglobin A1C 5.9 (A) 4.0 - 5.6 %   HbA1c POC (<> result, manual entry)     HbA1c, POC (prediabetic range)     HbA1c, POC (controlled diabetic range)      Review of Systems  Constitutional: Negative for diaphoresis and fatigue.  HENT: Negative for congestion and rhinorrhea.   Respiratory: Negative for cough and shortness of breath.   Cardiovascular: Negative for chest pain and leg swelling.  Gastrointestinal: Negative for abdominal pain and diarrhea.  Skin: Negative for color change and rash.  Neurological: Negative for dizziness and headaches.  Psychiatric/Behavioral: Negative for behavioral problems and confusion.       Objective:   Physical Exam Vitals  signs reviewed.  Constitutional:      General: He is not in acute distress. HENT:     Head: Normocephalic and atraumatic.  Eyes:     General:        Right eye: No discharge.        Left eye: No discharge.  Neck:     Trachea: No tracheal deviation.  Cardiovascular:     Rate and Rhythm: Normal rate and regular rhythm.     Heart sounds: Normal heart sounds. No murmur.  Pulmonary:     Effort: Pulmonary effort is normal. No respiratory distress.     Breath sounds: Normal breath sounds.  Lymphadenopathy:     Cervical: No cervical adenopathy.  Skin:    General: Skin is warm and dry.  Neurological:     Mental Status: He is alert.     Coordination: Coordination normal.  Psychiatric:        Behavior: Behavior normal.      25 minutes was spent with the patient.  This statement verifies that 25 minutes was indeed spent with the patient.  More than 50% of this visit-total duration of the visit-was spent in counseling and coordination of care. The issues that the patient came in for today as reflected in the diagnosis (s) please refer to documentation for further details.      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

## 2019-02-04 ENCOUNTER — Telehealth: Payer: Self-pay

## 2019-02-04 NOTE — Telephone Encounter (Signed)
Returned pt's call regarding when his appt dates/times were. Pt had no further questions.

## 2019-02-06 ENCOUNTER — Ambulatory Visit (HOSPITAL_COMMUNITY)
Admission: RE | Admit: 2019-02-06 | Discharge: 2019-02-06 | Disposition: A | Discharge status: Home / Self Care | Payer: Medicare Other | Source: Ambulatory Visit | Attending: Family Medicine | Admitting: Family Medicine

## 2019-02-06 DIAGNOSIS — M79604 Pain in right leg: Secondary | ICD-10-CM | POA: Diagnosis not present

## 2019-02-06 DIAGNOSIS — I739 Peripheral vascular disease, unspecified: Secondary | ICD-10-CM | POA: Diagnosis not present

## 2019-02-06 DIAGNOSIS — M79605 Pain in left leg: Secondary | ICD-10-CM | POA: Diagnosis not present

## 2019-02-08 ENCOUNTER — Telehealth: Payer: Self-pay | Admitting: Family Medicine

## 2019-02-08 ENCOUNTER — Telehealth: Payer: Self-pay | Admitting: Cardiovascular Disease

## 2019-02-08 NOTE — Telephone Encounter (Signed)
Patient stopped entresto last fall sometime, says he did not  Call us to let us know.Had lower extremity tests with Dr.Luking today, wanted results.I deferred him to Dr Wolfgang Phoenix. Made f/u apt for Dr.Branch

## 2019-02-08 NOTE — Telephone Encounter (Signed)
There is some signs of peripheral vascular disease it is not considered severe.  We will send notification to his vascular doctor we recommend following up as already scheduled later in the month with with vascular

## 2019-02-08 NOTE — Telephone Encounter (Signed)
Patient would like [facility] nurse to call to give results of arterial ultrasound.

## 2019-02-08 NOTE — Telephone Encounter (Signed)
Results discussed with patient. Patient advised per Dr Nicki Reaper: there is some signs of peripheral vascular disease it is not considered severe.  We will send notification to his vascular doctor we recommend following up as already scheduled later in the month with with vascular. Patient verbalized understanding.

## 2019-02-08 NOTE — Telephone Encounter (Signed)
Patient called and asked to speak with [facility]. When I asked what it was in regards to, he stated "she already knows"/ tg

## 2019-02-12 ENCOUNTER — Other Ambulatory Visit: Payer: Self-pay | Admitting: *Deleted

## 2019-02-12 ENCOUNTER — Telehealth: Payer: Self-pay

## 2019-02-12 DIAGNOSIS — M5440 Lumbago with sciatica, unspecified side: Secondary | ICD-10-CM

## 2019-02-12 DIAGNOSIS — M25552 Pain in left hip: Secondary | ICD-10-CM

## 2019-02-12 NOTE — Telephone Encounter (Signed)
Mr Aggie Hacker is concerned about left hip

## 2019-02-12 NOTE — Telephone Encounter (Signed)
Orders put in. Tried to call no answer to notify pt.

## 2019-02-12 NOTE — Telephone Encounter (Signed)
This patient having left hip pain low back pain with sciatica down the left leg  Please order lumbar spine x-ray left hip x-ray patient can do this at his convenience

## 2019-02-13 NOTE — Telephone Encounter (Signed)
Left detailed message on home answering machine.

## 2019-02-14 NOTE — Telephone Encounter (Signed)
Contacted patient and pt is aware of xrays ordered.

## 2019-02-15 ENCOUNTER — Other Ambulatory Visit: Payer: Self-pay | Admitting: Family Medicine

## 2019-02-15 ENCOUNTER — Ambulatory Visit (HOSPITAL_COMMUNITY)
Admission: RE | Admit: 2019-02-15 | Discharge: 2019-02-15 | Disposition: A | Discharge status: Home / Self Care | Payer: Medicare Other | Source: Ambulatory Visit | Attending: Family Medicine | Admitting: Family Medicine

## 2019-02-15 ENCOUNTER — Other Ambulatory Visit: Payer: Self-pay

## 2019-02-15 DIAGNOSIS — M5440 Lumbago with sciatica, unspecified side: Secondary | ICD-10-CM | POA: Diagnosis not present

## 2019-02-15 DIAGNOSIS — M25552 Pain in left hip: Secondary | ICD-10-CM | POA: Diagnosis not present

## 2019-02-15 DIAGNOSIS — M5416 Radiculopathy, lumbar region: Secondary | ICD-10-CM | POA: Diagnosis not present

## 2019-02-15 DIAGNOSIS — M1612 Unilateral primary osteoarthritis, left hip: Secondary | ICD-10-CM | POA: Diagnosis not present

## 2019-02-19 ENCOUNTER — Telehealth: Payer: Self-pay | Admitting: Family Medicine

## 2019-02-19 ENCOUNTER — Other Ambulatory Visit: Payer: Self-pay

## 2019-02-19 MED ORDER — METOPROLOL SUCCINATE ER 25 MG PO TB24: 25.0000 mg | ORAL_TABLET | Freq: Every day | ORAL | 5 refills | Status: DC

## 2019-02-19 NOTE — Telephone Encounter (Signed)
Medication sent in and pt is aware  

## 2019-02-19 NOTE — Telephone Encounter (Signed)
Pt is needing refill on metoprolol succinate (TOPROL-XL) 25 MG 24 hr tablet.   Please send to [place], [id number]

## 2019-02-25 ENCOUNTER — Other Ambulatory Visit: Payer: Self-pay

## 2019-02-25 ENCOUNTER — Ambulatory Visit (HOSPITAL_COMMUNITY)
Admission: RE | Admit: 2019-02-25 | Discharge: 2019-02-25 | Disposition: A | Discharge status: Home / Self Care | Payer: Medicare Other | Source: Ambulatory Visit | Attending: Vascular Surgery | Admitting: Vascular Surgery

## 2019-02-25 DIAGNOSIS — I714 Abdominal aortic aneurysm, without rupture, unspecified: Secondary | ICD-10-CM

## 2019-03-01 ENCOUNTER — Telehealth: Payer: Self-pay | Admitting: Family Medicine

## 2019-03-01 ENCOUNTER — Ambulatory Visit: Payer: Medicare Other | Admitting: Vascular Surgery

## 2019-03-01 NOTE — Telephone Encounter (Signed)
Patient got a tick bite 3/26on his right side and wanting something called into [facility] [place]

## 2019-03-01 NOTE — Telephone Encounter (Signed)
Tried cell number customer not available.

## 2019-03-01 NOTE — Telephone Encounter (Signed)
Phone is busy

## 2019-03-04 NOTE — Telephone Encounter (Signed)
THANKS MARYANN!

## 2019-03-04 NOTE — Telephone Encounter (Signed)
DONE

## 2019-03-04 NOTE — Telephone Encounter (Signed)
Number on message is no longer valid. Please take out  Numbers [phone] and [phone]. Only number that is valid in chart is home number. Please leave that one

## 2019-03-04 NOTE — Telephone Encounter (Signed)
I agree with management if he has any ongoing troubles the patient should notify us

## 2019-03-04 NOTE — Telephone Encounter (Signed)
FYI - Pt states area is not bothering him. He just gets paranoid with tick bites. But states he does not think he needs anything at this time. States he got tick right off and not having any rash. Feeling fine, no fever, no body aches, will call back if anything changes.

## 2019-03-13 ENCOUNTER — Other Ambulatory Visit: Payer: Self-pay | Admitting: Family Medicine

## 2019-03-13 MED ORDER — HYDROCODONE-ACETAMINOPHEN 10-325 MG PO TABS: ORAL_TABLET | ORAL | 0 refills | Status: DC

## 2019-03-25 ENCOUNTER — Other Ambulatory Visit: Payer: Self-pay | Admitting: Family Medicine

## 2019-04-30 ENCOUNTER — Telehealth: Payer: Self-pay | Admitting: Family Medicine

## 2019-04-30 NOTE — Telephone Encounter (Signed)
Pt is still having hip pain and would like to move forward with the MRI of hip.

## 2019-04-30 NOTE — Telephone Encounter (Signed)
Pt called back. Pt has virtual visit to discuss hip pain tomorrow.

## 2019-04-30 NOTE — Telephone Encounter (Addendum)
Patient has office visit 05/01/2019 with Dr Nicki Reaper to discuss ongoing hip pain and discuss need for MRI

## 2019-04-30 NOTE — Telephone Encounter (Signed)
Pt wanted MRI ordered for hip pain. Pt called back and asked for an appt to discuss pain with Dr. Nicki Reaper. Pt has a virtual appt tomorrow.

## 2019-05-01 ENCOUNTER — Telehealth: Payer: Self-pay | Admitting: Family Medicine

## 2019-05-01 ENCOUNTER — Other Ambulatory Visit: Payer: Self-pay

## 2019-05-01 ENCOUNTER — Ambulatory Visit (INDEPENDENT_AMBULATORY_CARE_PROVIDER_SITE_OTHER): Payer: Medicare Other | Admitting: Family Medicine

## 2019-05-01 ENCOUNTER — Other Ambulatory Visit: Payer: Self-pay | Admitting: Family Medicine

## 2019-05-01 ENCOUNTER — Encounter: Payer: Self-pay | Admitting: Family Medicine

## 2019-05-01 VITALS — Wt 190.0 lb

## 2019-05-01 DIAGNOSIS — M5432 Sciatica, left side: Secondary | ICD-10-CM

## 2019-05-01 DIAGNOSIS — I10 Essential (primary) hypertension: Secondary | ICD-10-CM | POA: Diagnosis not present

## 2019-05-01 DIAGNOSIS — E1142 Type 2 diabetes mellitus with diabetic polyneuropathy: Secondary | ICD-10-CM | POA: Diagnosis not present

## 2019-05-01 DIAGNOSIS — I714 Abdominal aortic aneurysm, without rupture, unspecified: Secondary | ICD-10-CM

## 2019-05-01 DIAGNOSIS — J449 Chronic obstructive pulmonary disease, unspecified: Secondary | ICD-10-CM

## 2019-05-01 DIAGNOSIS — M25552 Pain in left hip: Secondary | ICD-10-CM

## 2019-05-01 MED ORDER — HYDROCODONE-ACETAMINOPHEN 10-325 MG PO TABS: ORAL_TABLET | ORAL | 0 refills | Status: DC

## 2019-05-01 MED FILL — HYDROCODON-APAP 10-325: 10-325 | 30 days supply | Qty: 60 | Fill #0

## 2019-05-01 NOTE — Addendum Note (Signed)
Addended by: Karle Barr on: 05/01/2019 01:15 PM   Modules accepted: Orders

## 2019-05-01 NOTE — Telephone Encounter (Signed)
The rx's are pending.

## 2019-05-01 NOTE — Addendum Note (Signed)
Addended by: Sallee Lange A on: 05/01/2019 02:26 PM   Modules accepted: Orders

## 2019-05-01 NOTE — Telephone Encounter (Signed)
FYI

## 2019-05-01 NOTE — Progress Notes (Signed)
Subjective:    Patient ID: Peter [name], male    DOB: 03/18/1941, 78 y.o.   MRN: [id number]  Hip Pain   The incident occurred more than 1 week ago. Injury mechanism: March did some work outside [employer] around; started around that time. The pain is present in the left hip. Associated symptoms include numbness. Associated symptoms comments: Numbness from hip to foot at times.  pt states he is having to look out for wife and is not able to rest properly. Pt had xray done in 02/15/2019. Pt is wondering if a cortisone shot would help.    Patient for blood pressure check up.  The patient does have hypertension.  The patient is on medication.  Patient relates compliance with meds. Todays BP reviewed with the patient. Patient denies issues with medication. Patient relates reasonable diet. Patient tries to minimize salt. Patient aware of BP goals.  Has diabetes issues tries to watch his diet as best he can keep sugars under good control we monitor this periodically with lab work  Has a history of aortic aneurysm no sign of any rupture currently had an ultrasound this past March  Mild COPD related issues but denies any type of breathing issues and does not want to be on any type of inhalers Virtual Visit via Video Note  I connected with Peter [name] on 05/01/19 at 10:00 AM EDT by a video enabled telemedicine application and verified that I am speaking with the correct person using two identifiers.  Location: Patient: home Provider: office   I discussed the limitations of evaluation and management by telemedicine and the availability of in person appointments. The patient expressed understanding and agreed to proceed.  History of Present Illness:    Observations/Objective:   Assessment and Plan:   Follow Up Instructions:    I discussed the assessment and treatment plan with the patient. The patient was provided an opportunity to ask questions and all were answered. The patient  agreed with the plan and demonstrated an understanding of the instructions.   The patient was advised to call back or seek an in-person evaluation if the symptoms worsen or if the condition fails to improve as anticipated.  I provided 25 minutes of non-face-to-face time during this encounter.   [name], LPN   Review of Systems  Constitutional: Negative for diaphoresis and fatigue.  HENT: Negative for congestion and rhinorrhea.   Respiratory: Negative for cough and shortness of breath.   Cardiovascular: Negative for chest pain and leg swelling.  Gastrointestinal: Negative for abdominal pain and diarrhea.  Musculoskeletal: Positive for arthralgias and back pain.  Skin: Negative for color change and rash.  Neurological: Positive for numbness. Negative for dizziness and headaches.  Psychiatric/Behavioral: Negative for behavioral problems and confusion.       Objective:   Physical Exam Today's visit was via telephone Physical exam was not possible for this visit   25 minutes was spent with the patient.  This statement verifies that 25 minutes was indeed spent with the patient.  More than 50% of this visit-total duration of the visit-was spent in counseling and coordination of care. The issues that the patient came in for today as reflected in the diagnosis (s) please refer to documentation for further details.      Assessment & Plan:  Blood pressure under good control taking medication on a regular basis denies any major issues with this  Reflux under decent control takes acid blocker intermittently doing well  with this  Underlying history of heart disease and vascular disease trying to get some exercise and eat healthy.  Left sciatica into the buttock region uses pain medication twice a day denies abusing it we will send in prescriptions when he notifies [place] regarding the pharmacy he would like [place] to utilize in [place]  History of aortic aneurysm was ultrasound in March  will need another ultrasound in September followed by specialist  Underlying COPD not on any type of inhalers currently is doing his best to avoid coronavirus  Underlying type 2 diabetes with peripheral neuropathy stable currently.  Thanks his sugars under good control will do lab in the summer

## 2019-05-01 NOTE — Telephone Encounter (Signed)
the prescriptions were signed and sent accordingly

## 2019-05-01 NOTE — Progress Notes (Signed)
Rxs are pending. Patient is aware they should be at pharmacy later in the day.

## 2019-05-01 NOTE — Telephone Encounter (Signed)
Addendum was sent on to his visit today regarding this medication please do the 3 pain scripts on that addendum then I will sign it move forward thank you

## 2019-05-01 NOTE — Telephone Encounter (Signed)
Pt called to ask for Rx to be sent to [facility] pharmacy  States Dr. Nicki Reaper is waiting for this information from the phone visit pt had this morning

## 2019-05-02 ENCOUNTER — Telehealth: Payer: Self-pay | Admitting: Family Medicine

## 2019-05-02 NOTE — Telephone Encounter (Signed)
FYI only, Patient states he had originally told Dr. Nicki Reaper he couldn't see the Ortho doctor next week due to family issues but is calling us back to tell us he is now available  Monday through Thursday for appts.  I told him we would make the referral but I wasn't sure they could seem him next week, it may take longer.

## 2019-05-07 ENCOUNTER — Ambulatory Visit: Payer: Medicare Other | Admitting: Family Medicine

## 2019-05-16 ENCOUNTER — Telehealth: Payer: Medicare Other | Admitting: Cardiology

## 2019-05-21 ENCOUNTER — Other Ambulatory Visit: Payer: Self-pay | Admitting: Family Medicine

## 2019-05-21 ENCOUNTER — Encounter: Payer: Self-pay | Admitting: Family Medicine

## 2019-05-21 DIAGNOSIS — E1142 Type 2 diabetes mellitus with diabetic polyneuropathy: Secondary | ICD-10-CM

## 2019-05-21 DIAGNOSIS — I10 Essential (primary) hypertension: Secondary | ICD-10-CM

## 2019-05-21 DIAGNOSIS — Z79899 Other long term (current) drug therapy: Secondary | ICD-10-CM

## 2019-05-21 DIAGNOSIS — E785 Hyperlipidemia, unspecified: Secondary | ICD-10-CM

## 2019-06-05 ENCOUNTER — Other Ambulatory Visit: Payer: Self-pay | Admitting: Family Medicine

## 2019-06-24 ENCOUNTER — Other Ambulatory Visit: Payer: Self-pay | Admitting: Family Medicine

## 2019-06-25 ENCOUNTER — Other Ambulatory Visit: Payer: Self-pay

## 2019-06-25 MED ORDER — ACCU-CHEK FASTCLIX LANCETS MISC: 5 refills | Status: DC

## 2019-06-25 NOTE — Telephone Encounter (Signed)
Pt states that he is checking his sugars twice a day and has been checking sugars twice a day for a while. Pt informed that by Medicare criteria he only qualifies for once a day. Pt states he has been checking twice a day for a while and has ran out of lancets. Informed pt to have lab work done in the next 30 days. Pt verbalized understanding. Please advise. Thank you

## 2019-06-25 NOTE — Telephone Encounter (Signed)
So the patient will need to send to [place] his log readouts regarding his twice a day testing Until he gets his lab work completed and the log is reviewed Medicare will only approve once daily testing For now I recommend once daily testing he can sometimes do it in the morning sometimes later in the day Then once we have lab work and logbook I can reassess his situation

## 2019-06-25 NOTE — Telephone Encounter (Signed)
Contacted patient. Pt verbalized understanding. On pts lancets the directions are test once daily; however on test strip the directions are use one strip to check glucose twice daily due to fluctuating sugars. Pt states he will keep a log and will get blood work done. Please advise on directions. Thank you

## 2019-06-25 NOTE — Telephone Encounter (Signed)
Pharmacy requesting refill on Fastclix Lancets. Pt states he is now testing twice daily.  Can we give new RX for BID dosing? Please advise. Thank you

## 2019-06-25 NOTE — Telephone Encounter (Signed)
Peter [name]-if he states his sugars fluctuate a lot he may have a prescription that says twice daily testing #60 with 5 refills but he will need to submit a log to verify that he is testing it twice daily-this is not my requirement this is Medicare requirement and the insurance company and pharmacy will require these in order to fill the prescription Still do his lab work as requested

## 2019-06-25 NOTE — Telephone Encounter (Signed)
Nurses Currently with his medications by Medicare criteria he qualifies for once daily testing The patient will need to do his lab work somewhere within the next 30 days to see how his numbers are doing Lab work orders are already in the system

## 2019-06-25 NOTE — Telephone Encounter (Signed)
Refill has been sent to pharmacy.  

## 2019-07-01 ENCOUNTER — Other Ambulatory Visit: Payer: Self-pay

## 2019-07-01 MED ORDER — ACCU-CHEK FASTCLIX LANCETS MISC: 5 refills | Status: DC

## 2019-07-05 ENCOUNTER — Other Ambulatory Visit: Payer: Self-pay | Admitting: *Deleted

## 2019-07-05 MED ORDER — ACCU-CHEK FASTCLIX LANCETS MISC: 5 refills | Status: DC

## 2019-07-09 ENCOUNTER — Telehealth: Payer: Self-pay | Admitting: Family Medicine

## 2019-07-09 ENCOUNTER — Other Ambulatory Visit: Payer: Self-pay | Admitting: Family Medicine

## 2019-07-09 MED ORDER — HYDROCODONE-ACETAMINOPHEN 10-325 MG PO TABS: ORAL_TABLET | ORAL | 0 refills | Status: DC

## 2019-07-09 MED FILL — HYDROCODON-APAP 10-325: 10-325 | 30 days supply | Qty: 60 | Fill #0

## 2019-07-09 NOTE — Telephone Encounter (Signed)
Pt needs refill on HYDROcodone-acetaminophen (NORCO) 10-325 MG tablet   Please advise & call pt when done   [place]

## 2019-07-09 NOTE — Telephone Encounter (Signed)
Please advise thank you

## 2019-07-09 NOTE — Telephone Encounter (Signed)
Per the patient request it was sent in today

## 2019-07-09 NOTE — Telephone Encounter (Signed)
Pt contacted and verbalized understanding.  

## 2019-08-22 ENCOUNTER — Telehealth: Payer: Self-pay | Admitting: Family Medicine

## 2019-08-22 NOTE — Telephone Encounter (Signed)
Patient would like for him and his wife office visit instead of phone visit for their 6 month follow up due in October. I know that like to be seen together . Please Advise

## 2019-08-22 NOTE — Telephone Encounter (Signed)
That would be permissible (It would be wise to mention to the patient that if either of them come down with flulike symptoms before the visit we would have to reschedule)

## 2019-08-23 ENCOUNTER — Other Ambulatory Visit: Payer: Self-pay

## 2019-08-23 ENCOUNTER — Encounter: Payer: Self-pay | Admitting: Cardiology

## 2019-08-23 ENCOUNTER — Telehealth (INDEPENDENT_AMBULATORY_CARE_PROVIDER_SITE_OTHER): Payer: Medicare Other | Admitting: Cardiology

## 2019-08-23 VITALS — BP 150/85 | Ht 71.5 in | Wt 192.0 lb

## 2019-08-23 DIAGNOSIS — I5022 Chronic systolic (congestive) heart failure: Secondary | ICD-10-CM

## 2019-08-23 DIAGNOSIS — I2510 Atherosclerotic heart disease of native coronary artery without angina pectoris: Secondary | ICD-10-CM | POA: Diagnosis not present

## 2019-08-23 DIAGNOSIS — I10 Essential (primary) hypertension: Secondary | ICD-10-CM

## 2019-08-23 DIAGNOSIS — E782 Mixed hyperlipidemia: Secondary | ICD-10-CM

## 2019-08-23 NOTE — Progress Notes (Signed)
Virtual Visit via Telephone Note   This visit type was conducted due to national recommendations for restrictions regarding the COVID-19 Pandemic (e.g. social distancing) in an effort to limit this patient's exposure and mitigate transmission in our community.  Due to his co-morbid illnesses, this patient is at least at moderate risk for complications without adequate follow up.  This format is felt to be most appropriate for this patient at this time.  The patient did not have access to video technology/had technical difficulties with video requiring transitioning to audio format only (telephone).  All issues noted in this document were discussed and addressed.  No physical exam could be performed with this format.  Please refer to the patient's chart for his  consent to telehealth for [facility].   Date:  08/23/2019   ID:  Peter [name], DOB [birth year], MRN [id number]  Patient Location: Home Provider Location: Office  PCP:  Kathyrn Drown, MD  Cardiologist:  Carlyle Dolly, MD  Electrophysiologist:  None   Evaluation Performed:  Follow-Up Visit  Chief Complaint:  Follow up visit  History of Present Illness:    Peter [name] is a 78 y.o. male seen today for follow up of the following medical problems.    1. CAD/ICM/Chronic systolic HF - hx of prior CABG 1997 at [facility] - stent to SVG-PDA and PTCA of the LAD in 05/2002. Normal LVEF at that time - 2008 heart cath [facility] had repeat stenting, he is unsure of the details - cath 2015 showing patent SVG-OM1-OM2, SVG-RI, SVG-D1, and LIMA-LAD with occluded SVG-PDA and severe stenosis of mid-LAD at LIMA insertion with DES placed),   - 04/2018 cath showed severe 3-vessel CAD with patent LIMA-LAD, patent stent along mid-LAD, patent SVG-OM1-OM2, and patent SVG-RI with occlusion of D1 and known occlusion of SVG-PDA. Thediagonal occlusion was thought tobe old, therefore continued medical therapy was recommended.  - 04/2018  echo LVEF 30-35%.     - no recent chest pain. No SOB or DOE. No recent edema - compliant with meds. Dizziness on entresto and he stopped taking. He is not intersted in starting low dose losartan   2. HTN - stopped his entresto on his own.   3. HL - he stopped his statin, not intersted in restarting     The patient does not have symptoms concerning for COVID-19 infection (fever, chills, cough, or new shortness of breath).    Past Medical History:  Diagnosis Date  . AAA (abdominal aortic aneurysm) ([place])   . CAD (coronary artery disease)    a. s/p CABG in 1997 b. stent to SVG-PDA and PTCA of LAD in 2003 c. cath in 2015 showing patent SVG-OM1-OM2, SVG-RI, SVG-D1, and LIMA-LAD with occluded SVG-PDA and severe stenosis of mid-LAD at LIMA insertion with DES placed d. 04/2018: similar results to 2015 with patent LAD stent; occlusion of small caliber D1 stenosis with medical management recommended.   . Chronic back pain 07/25/2018   Chronic lumbar pain uses hydrocodone intermittently  . Chronic systolic CHF (congestive heart failure) ([place])   . COPD (chronic obstructive pulmonary disease) ([place])   . Diabetes mellitus    diet controlled  . Dyslipidemia   . Hypertension   . Myocardial infarction [facility]) 1997   stents x2 (2003/2007)   Past Surgical History:  Procedure Laterality Date  . CORONARY ANGIOPLASTY  2003/2007   2 stents  . CORONARY ANGIOPLASTY WITH STENT PLACEMENT  08/14/14   DES to LIMA-LAD insertion  . CORONARY  ARTERY BYPASS GRAFT  1997   7 vessels  . CORONARY STENT PLACEMENT  08/15/2014   MID LAD  DES  by Dr Burt Knack  . CYSTECTOMY  2005   top of head-[place]  . EAR CYST EXCISION  07/09/2012   Procedure: CYST REMOVAL;  Surgeon: Jamesetta So, MD;  Location: [facility]S;  Service: General;  Laterality: N/A;  . LEFT HEART CATH AND CORS/GRAFTS ANGIOGRAPHY N/A 04/16/2018   Procedure: LEFT HEART CATH AND CORS/GRAFTS ANGIOGRAPHY;  Surgeon: Martinique, Peter M, MD;  Location: [place] CV  LAB;  Service: Cardiovascular;  Laterality: N/A;  . LEFT HEART CATHETERIZATION WITH CORONARY/GRAFT ANGIOGRAM N/A 08/14/2014   Procedure: LEFT HEART CATHETERIZATION WITH Beatrix Fetters;  Surgeon: Blane Ohara, MD;  Location: [facility] CATH LAB;  Service: Cardiovascular;  Laterality: N/A;     No outpatient medications have been marked as taking for the 08/23/19 encounter (Appointment) with Arnoldo Lenis, MD.     Allergies:   Dye fdc red [red dye], Ivp dye [iodinated diagnostic agents], Keflex [cephalexin], and Plavix [clopidogrel]   Social History   Tobacco Use  . Smoking status: Former Smoker    Packs/day: 3.00    Years: 40.00    Pack years: 120.00    Types: Cigarettes    Quit date: 07/02/2000    Years since quitting: 19.1  . Smokeless tobacco: Never Used  Substance Use Topics  . Alcohol use: No    Alcohol/week: 10.0 standard drinks    Types: 10 Cans of beer per week    Comment: quit 3 months (01/2013). 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  . Drug use: No     Family Hx: The patient's family history includes Heart attack in his mother; Hypertension in his father; Other in his father. There is no history of Colon cancer or Liver disease.  ROS:   Please see the history of present illness.     All other systems reviewed and are negative.   Prior CV studies:   The following studies were reviewed today:   Labs/Other Tests and Data Reviewed:    EKG:  No ECG reviewed.  Recent Labs: 09/06/2018: BUN 16; Creatinine, Ser 1.25; Potassium 3.7; Sodium 134   Recent Lipid Panel Lab Results  Component Value Date/Time   CHOL 199 07/16/2018 11:49 AM   TRIG 103 07/16/2018 11:49 AM   HDL 33 (L) 07/16/2018 11:49 AM   CHOLHDL 6.0 (H) 07/16/2018 11:49 AM   CHOLHDL 6.8 04/13/2018 05:12 AM   LDLCALC 145 (H) 07/16/2018 11:49 AM    Wt Readings from Last 3 Encounters:  05/01/19 190 lb (86.2 kg)  01/30/19 191 lb 6.4 oz (86.8 kg)  10/30/18 189 lb 6.4 oz  (85.9 kg)     Objective:    Vital Signs:   Today's Vitals   08/23/19 1300  BP: (!) 150/85  Weight: 192 lb (87.1 kg)  Height: 5' 11.5" (1.816 m)   Body mass index is 26.41 kg/m. NOrmal affect. Normal speech pattern and tone. Comfortable, no apparent distress.   ASSESSMENT & PLAN:    1. CAD/ICM/Chronic systolic heart - denies any symptoms - he stopped his entresto on his own due to dizziness, he is not interested in trying low dose losartan. Not interested in any medication changes at this time despite our discussion about the clinical benefits of medical therapy in CAD and systolic HF  2. Hyperlipidemia - he stopped his statin on his own, in general not interested in  taking many pills. Not intersted in restarting  3. HTN - elevated but not interested in adding to his regimen   F/u 6 months  COVID-19 Education: The signs and symptoms of COVID-19 were discussed with the patient and how to seek care for testing (follow up with PCP or arrange E-visit).  The importance of social distancing was discussed today.  Time:   Today, I have spent 15 minutes with the patient with telehealth technology discussing the above problems.     Medication Adjustments/Labs and Tests Ordered: Current medicines are reviewed at length with the patient today.  Concerns regarding medicines are outlined above.   Tests Ordered: No orders of the defined types were placed in this encounter.   Medication Changes: No orders of the defined types were placed in this encounter.   Follow Up:  In Person in 6 month(s)  Signed, Carlyle Dolly, MD  08/23/2019 12:13 PM    [place]

## 2019-08-26 ENCOUNTER — Telehealth: Payer: Self-pay | Admitting: Family Medicine

## 2019-08-26 NOTE — Telephone Encounter (Signed)
Patient called and scheduled an in office visit for October 6th but said he would like to go ahead and have his "aneurysm checked in his leg".  Michela Pitcher he is suppose to go about every six months to check on it and said that Dr. Nicki Reaper knows all about it.  He said [facility] he doesn't like the nurse there so wants to go to [facility] instead.  I told him he could discuss all this in his upcoming appt but he said he would like to have the test done before he comes in.

## 2019-08-26 NOTE — Telephone Encounter (Signed)
Please advise. Thank you

## 2019-08-27 NOTE — Telephone Encounter (Signed)
Please let patient know that I would advise that we discuss this further when he follows up with his appointment in a couple weeks

## 2019-08-27 NOTE — Telephone Encounter (Signed)
Pt contacted and verbalized understanding.  

## 2019-09-05 DIAGNOSIS — Z79899 Other long term (current) drug therapy: Secondary | ICD-10-CM | POA: Diagnosis not present

## 2019-09-05 DIAGNOSIS — E785 Hyperlipidemia, unspecified: Secondary | ICD-10-CM | POA: Diagnosis not present

## 2019-09-05 DIAGNOSIS — E1142 Type 2 diabetes mellitus with diabetic polyneuropathy: Secondary | ICD-10-CM | POA: Diagnosis not present

## 2019-09-05 DIAGNOSIS — I10 Essential (primary) hypertension: Secondary | ICD-10-CM | POA: Diagnosis not present

## 2019-09-06 ENCOUNTER — Ambulatory Visit: Payer: Medicare Other | Admitting: Family Medicine

## 2019-09-06 LAB — HEPATIC FUNCTION PANEL
ALT: 19 IU/L (ref 0–44)
AST: 22 IU/L (ref 0–40)
Albumin: 4.1 g/dL (ref 3.7–4.7)
Alkaline Phosphatase: 89 IU/L (ref 39–117)
Bilirubin Total: 0.8 mg/dL (ref 0.0–1.2)
Bilirubin, Direct: 0.24 mg/dL (ref 0.00–0.40)
Total Protein: 7.2 g/dL (ref 6.0–8.5)

## 2019-09-06 LAB — LIPID PANEL
Chol/HDL Ratio: 6.5 ratio — ABNORMAL HIGH (ref 0.0–5.0)
Cholesterol, Total: 214 mg/dL — ABNORMAL HIGH (ref 100–199)
HDL: 33 mg/dL — ABNORMAL LOW (ref >39)
LDL Chol Calc (NIH): 158 mg/dL — ABNORMAL HIGH (ref 0–99)
Triglycerides: 127 mg/dL (ref 0–149)
VLDL Cholesterol Cal: 23 mg/dL (ref 5–40)

## 2019-09-06 LAB — BASIC METABOLIC PANEL
BUN/Creatinine Ratio: 13 (ref 10–24)
BUN: 15 mg/dL (ref 8–27)
CO2: 25 mmol/L (ref 20–29)
Calcium: 9.4 mg/dL (ref 8.6–10.2)
Chloride: 101 mmol/L (ref 96–106)
Creatinine, Ser: 1.2 mg/dL (ref 0.76–1.27)
GFR calc Af Amer: 67 mL/min/{1.73_m2} (ref >59)
GFR calc non Af Amer: 58 mL/min/{1.73_m2} — ABNORMAL LOW (ref >59)
Glucose: 131 mg/dL — ABNORMAL HIGH (ref 65–99)
Potassium: 4.5 mmol/L (ref 3.5–5.2)
Sodium: 140 mmol/L (ref 134–144)

## 2019-09-06 LAB — HEMOGLOBIN A1C
Est. average glucose Bld gHb Est-mCnc: 151 mg/dL
Hgb A1c MFr Bld: 6.9 % — ABNORMAL HIGH (ref 4.8–5.6)

## 2019-09-06 LAB — MICROALBUMIN / CREATININE URINE RATIO
Creatinine, Urine: 129.1 mg/dL
Microalb/Creat Ratio: 11 mg/g creat (ref 0–29)
Microalbumin, Urine: 14.2 ug/mL

## 2019-09-10 ENCOUNTER — Encounter: Payer: Self-pay | Admitting: Family Medicine

## 2019-09-10 ENCOUNTER — Telehealth: Payer: Self-pay | Admitting: *Deleted

## 2019-09-10 ENCOUNTER — Ambulatory Visit (INDEPENDENT_AMBULATORY_CARE_PROVIDER_SITE_OTHER): Payer: Medicare Other | Admitting: Family Medicine

## 2019-09-10 ENCOUNTER — Other Ambulatory Visit: Payer: Self-pay

## 2019-09-10 VITALS — BP 136/74

## 2019-09-10 DIAGNOSIS — E1142 Type 2 diabetes mellitus with diabetic polyneuropathy: Secondary | ICD-10-CM | POA: Diagnosis not present

## 2019-09-10 DIAGNOSIS — E785 Hyperlipidemia, unspecified: Secondary | ICD-10-CM | POA: Diagnosis not present

## 2019-09-10 DIAGNOSIS — I714 Abdominal aortic aneurysm, without rupture, unspecified: Secondary | ICD-10-CM

## 2019-09-10 DIAGNOSIS — M545 Low back pain, unspecified: Secondary | ICD-10-CM

## 2019-09-10 DIAGNOSIS — I10 Essential (primary) hypertension: Secondary | ICD-10-CM

## 2019-09-10 DIAGNOSIS — G8929 Other chronic pain: Secondary | ICD-10-CM

## 2019-09-10 MED ORDER — HYDROCODONE-ACETAMINOPHEN 10-325 MG PO TABS: ORAL_TABLET | ORAL | 0 refills | Status: DC

## 2019-09-10 MED ORDER — METOPROLOL SUCCINATE ER 25 MG PO TB24: 25.0000 mg | ORAL_TABLET | Freq: Every day | ORAL | 1 refills | Status: DC

## 2019-09-10 MED ORDER — HYDROCODONE-ACETAMINOPHEN 10-325 MG PO TABS: 1.0000 | ORAL_TABLET | Freq: Three times a day (TID) | ORAL | 0 refills | Status: DC | PRN

## 2019-09-10 MED ORDER — NITROGLYCERIN 0.4 MG SL SUBL: 0.4000 mg | SUBLINGUAL_TABLET | SUBLINGUAL | 3 refills | Status: DC | PRN

## 2019-09-10 NOTE — Progress Notes (Addendum)
Subjective:    Patient ID: Peter [name], male    DOB: [birth year], 78 y.o.   MRN: [id number]  Diabetes He presents for his follow-up diabetic visit. He has type 2 diabetes mellitus. Pertinent negatives for hypoglycemia include no confusion, dizziness or headaches. Pertinent negatives for diabetes include no chest pain and no fatigue.   Needs refill on hydrocodone and nitroglycerin ( [facility]) This patient was seen today for chronic pain  The medication list was reviewed and updated.   -Compliance with medication: He states he takes the medicine anywhere between 1-2 times a day  - Number patient states they take daily: 1 or 2/day  -when was the last dose patient took?  Yesterday  The patient was advised the importance of maintaining medication and not using illegal substances with these.  Here for refills and follow up  The patient was educated that we can provide 3 monthly scripts for their medication, it is their responsibility to follow the instructions.  Side effects or complications from medications: He denies any side effects denies abusing the medicine does state that it helps him function better  Patient is aware that pain medications are meant to minimize the severity of the pain to allow their pain levels to improve to allow for better function. They are aware of that pain medications cannot totally remove their pain.  Due for UDT ( at least once per year) : Will be due again on next visit   Patient does have an aneurysm he is followed on a regular basis in [place] but patient is states that he would like to have this test done locally he does not want to go to [place] because of transportation issues and having to care for his debilitated wife  Patient does have underlying heart disease he knows the importance of keeping sugar blood pressure cholesterol under good control he cannot afford the injectable medications for cholesterol and  does not want to be on them.  Has tried statins before and had problems with it does not want to be on those.  Patient is content with his current regimen.    Review of Systems  Constitutional: Negative for diaphoresis and fatigue.  HENT: Negative for congestion and rhinorrhea.   Respiratory: Negative for cough and shortness of breath.   Cardiovascular: Negative for chest pain and leg swelling.  Gastrointestinal: Negative for abdominal pain and diarrhea.  Skin: Negative for color change and rash.  Neurological: Negative for dizziness and headaches.  Psychiatric/Behavioral: Negative for behavioral problems and confusion.       Objective:   Physical Exam Vitals signs reviewed.  Constitutional:      General: He is not in acute distress. HENT:     Head: Normocephalic and atraumatic.  Eyes:     General:        Right eye: No discharge.        Left eye: No discharge.  Neck:     Trachea: No tracheal deviation.  Cardiovascular:     Rate and Rhythm: Normal rate and regular rhythm.     Heart sounds: Normal heart sounds. No murmur.  Pulmonary:     Effort: Pulmonary effort is normal. No respiratory distress.     Breath sounds: Normal breath sounds.  Lymphadenopathy:     Cervical: No cervical adenopathy.  Skin:    General: Skin is warm and dry.  Neurological:     Mental Status: He is alert.  Coordination: Coordination normal.  Psychiatric:        Behavior: Behavior normal.   Mild neuropathy in the feet no major deformity no ulcers        Assessment & Plan:  1. HTN (hypertension), benign Blood pressure initially elevated but on recheck it looks good watch diet continue medication  2. 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 Type 2 diabetes mellitus with peripheral neuropathy (HCC) Diabetes under decent control does have peripheral neuropathy but tolerating things fairly well  4. Dyslipidemia Hyperlipidemia patient does not tolerate statins does not want to be on statin unfortunately cannot afford newer medication  Underlying heart disease and claudication stable he denies any angina symptoms currently  25 minutes was spent with the patient.  This statement verifies that 25 minutes was indeed spent with the patient.  More than 50% of this visit-total duration of the visit-was spent in counseling and coordination of care. The issues that the patient came in for today as reflected in the diagnosis (s) please refer to documentation for further details.  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

## 2019-09-10 NOTE — Patient Instructions (Signed)
Results for orders placed or performed in visit on 05/21/19  Urine Microalbumin w/creat. ratio  Result Value Ref Range   Creatinine, Urine 129.1 Not Estab. mg/dL   Microalbumin, Urine 14.2 Not Estab. ug/mL   Microalb/Creat Ratio 11 0 - 29 mg/g creat  Lipid Profile  Result Value Ref Range   Cholesterol, Total 214 (H) 100 - 199 mg/dL   Triglycerides 127 0 - 149 mg/dL   HDL 33 (L) >39 mg/dL   VLDL Cholesterol Cal 23 5 - 40 mg/dL   LDL Chol Calc (NIH) 158 (H) 0 - 99 mg/dL   Chol/HDL Ratio 6.5 (H) 0.0 - 5.0 ratio  Hepatic function panel  Result Value Ref Range   Total Protein 7.2 6.0 - 8.5 g/dL   Albumin 4.1 3.7 - 4.7 g/dL   Bilirubin Total 0.8 0.0 - 1.2 mg/dL   Bilirubin, Direct 0.24 0.00 - 0.40 mg/dL   Alkaline Phosphatase 89 39 - 117 IU/L   AST 22 0 - 40 IU/L   ALT 19 0 - 44 IU/L  Basic Metabolic Panel (BMET)  Result Value Ref Range   Glucose 131 (H) 65 - 99 mg/dL   BUN 15 8 - 27 mg/dL   Creatinine, Ser 1.20 0.76 - 1.27 mg/dL   GFR calc non Af Amer 58 (L) >59 mL/min/1.73   GFR calc Af Amer 67 >59 mL/min/1.73   BUN/Creatinine Ratio 13 10 - 24   Sodium 140 134 - 144 mmol/L   Potassium 4.5 3.5 - 5.2 mmol/L   Chloride 101 96 - 106 mmol/L   CO2 25 20 - 29 mmol/L   Calcium 9.4 8.6 - 10.2 mg/dL  Hemoglobin A1c  Result Value Ref Range   Hgb A1c MFr Bld 6.9 (H) 4.8 - 5.6 %   Est. average glucose Bld gHb Est-mCnc 151 mg/dL

## 2019-09-10 NOTE — Telephone Encounter (Signed)
Pt states while at [facility] today he doesn't remember getting his bw results. He said a nurse could call him with results since dr scott was already in another room.

## 2019-09-11 MED FILL — HYDROCODON-APAP 10-325: 10-325 | 20 days supply | Qty: 60 | Fill #0

## 2019-09-11 NOTE — Telephone Encounter (Signed)
Discussed with pt. Pt verbalized understanding.  °

## 2019-09-11 NOTE — Telephone Encounter (Signed)
Cholesterol shows elevation of LDL.  But patient does not tolerate statins Urine test looks good no significant protein in the urine A1c looks good at 6.9 but this is higher than what it was but better than what was a year ago Liver functions look good Kidney functions look good We can repeat lab work in the spring I will mail a copy of the labs to the patient

## 2019-09-24 ENCOUNTER — Ambulatory Visit (HOSPITAL_COMMUNITY)
Admission: RE | Admit: 2019-09-24 | Discharge: 2019-09-24 | Disposition: A | Discharge status: Home / Self Care | Payer: Medicare Other | Source: Ambulatory Visit | Attending: Family Medicine | Admitting: Family Medicine

## 2019-09-24 ENCOUNTER — Other Ambulatory Visit: Payer: Self-pay

## 2019-09-24 DIAGNOSIS — I714 Abdominal aortic aneurysm, without rupture: Secondary | ICD-10-CM | POA: Insufficient documentation

## 2019-10-02 NOTE — Telephone Encounter (Signed)
error 

## 2019-10-11 ENCOUNTER — Other Ambulatory Visit: Payer: Self-pay | Admitting: Family Medicine

## 2019-10-24 ENCOUNTER — Telehealth: Payer: Self-pay | Admitting: Family Medicine

## 2019-10-24 NOTE — Telephone Encounter (Signed)
Called pt and he states he can make that appt tomorrow. erica added him to the schedule

## 2019-10-24 NOTE — Telephone Encounter (Signed)
Pt states pain is on left side about 2 inches above his belt line. Started after he reached back in his chair and twisted. States its is burning stinging pain that comes and goes. Feels it when he moves a certain way, no fever, no vomiting, no nausea, no diarrhea. He did state he had a hernia back in the 80's that he ripped and the pain feels the same way. He did not want to go to hospital since pain was not severe. Wanted to see if dr Nicki Reaper could see him tomorrow even though he is full and also wanted a mole checked.  Call on cell [phone].

## 2019-10-24 NOTE — Telephone Encounter (Signed)
We could do 3:50 PM on Friday if the patient would like to do that otherwise it will be next week or urgent care

## 2019-10-24 NOTE — Telephone Encounter (Signed)
Patient called today with left-side pain possible hernia which started on 11/16. We were full today and no appointments tomorrow he agreed to phone visit but need somewhere to put him schedule is full. Please advise

## 2019-10-25 ENCOUNTER — Encounter: Payer: Self-pay | Admitting: Family Medicine

## 2019-10-25 ENCOUNTER — Ambulatory Visit (INDEPENDENT_AMBULATORY_CARE_PROVIDER_SITE_OTHER): Payer: Medicare Other | Admitting: Family Medicine

## 2019-10-25 ENCOUNTER — Other Ambulatory Visit: Payer: Self-pay

## 2019-10-25 VITALS — BP 138/86 | Temp 98.2°F | Ht 71.5 in | Wt 188.0 lb

## 2019-10-25 DIAGNOSIS — S39011A Strain of muscle, fascia and tendon of abdomen, initial encounter: Secondary | ICD-10-CM | POA: Diagnosis not present

## 2019-10-25 DIAGNOSIS — L821 Other seborrheic keratosis: Secondary | ICD-10-CM | POA: Diagnosis not present

## 2019-10-25 NOTE — Progress Notes (Signed)
   Subjective:    Patient ID: Peter [name], male    DOB: 07/10/1941, 78 y.o.   MRN: [id number]  HPIleft side pain. Started last Friday after he reached back in chair to reach for something. Sharp lower abdominal pain on the left side hurts with certain movements hurts with certain rotations he noticed this after reaching backwards.  Denied any other particular trouble. Patient also has a mole on his side that has gotten larger and turned black he would like to see Dr. Arnoldo Morale general surgery to have this evaluated further  Having a burning pain since then.   Fall Risk  09/11/2019 06/27/2017 06/01/2015 04/09/2015 04/06/2015  Falls in the past year? 0 No No No (No Data)  Comment - - - - previous documentation in [facility] record  Risk for fall due to : - - - - -  Risk for fall due to: Comment - - - - -  Follow up Falls evaluation completed - - - -      Review of Systems  Constitutional: Negative for activity change.  HENT: Negative for congestion and rhinorrhea.   Respiratory: Negative for cough and shortness of breath.   Cardiovascular: Negative for chest pain.  Gastrointestinal: Negative for abdominal pain, diarrhea, nausea and vomiting.  Genitourinary: Negative for dysuria and hematuria.  Neurological: Negative for weakness and headaches.  Psychiatric/Behavioral: Negative for behavioral problems and confusion.       Objective:   Physical Exam Lungs clear respiratory rate normal heart regular abdomen soft no guarding rebound or tenderness Dark mole on the left side appears to be a seborrheic keratosis patient would like to be seen by Dr. Arnoldo Morale      Assessment & Plan:  Abnormal mole on the left side dark in it appears to be a seborrheic keratosis but patient states it has enlarged and dramatically changed in color recently he would like to see Dr. Arnoldo Morale to have it evaluated  Side strain no sign of any type of hernia noted if ongoing troubles notify us

## 2019-10-30 ENCOUNTER — Telehealth: Payer: Self-pay | Admitting: Family Medicine

## 2019-10-30 MED FILL — HYDROCODON-APAP 10-325: 10-325 | 20 days supply | Qty: 60 | Fill #0

## 2019-10-30 NOTE — Telephone Encounter (Signed)
Patient is requesting refill on hydrocodone called into [facility]. [phone]

## 2019-10-30 NOTE — Telephone Encounter (Signed)
Pt notified that 3 scripts were sent in on 09/10/19. One for oct, nov and dec and to check with pharm.

## 2019-11-11 ENCOUNTER — Telehealth: Payer: Self-pay | Admitting: *Deleted

## 2019-11-11 NOTE — Telephone Encounter (Signed)
Virtual Visit Pre-Appointment Phone Call  Today, I spoke with Peter [name] and performed the following actions:  1. I explained that we are currently trying to limit exposure to the COVID-19 virus by seeing patients at home rather than in the office.  I explained that the visits are best done by video, but can be done by telephone.  I asked the patient if a virtual visit that the patient would like to try instead of coming into the office. Peter [name] agreed to proceed with the virtual visit scheduled with Peter [name]  on 11/15/19.     2. I confirmed the BEST phone number to call the day of the visit and- I included this in appointment notes.  3. I asked if the patient had access to (through a family member/friend) a smartphone with video capability to be used for his visit?"  The patient said yes -   4. I confirmed consent by  a. sending through [place] or by email the [place] as written at the end of this message or  b. verbally as listed below. i. This visit is being performed in the setting of COVID-19. ii. All virtual visits are billed to your insurance company just like a normal visit would be.   iii. We'd like you to understand that the technology does not allow for your provider to perform an examination, and thus may limit your provider's ability to fully assess your condition.  iv. If your provider identifies any concerns that need to be evaluated in person, we will make arrangements to do so.   v. Finally, though the technology is pretty good, we cannot assure that it will always work on either your or our end, and in the setting of a video visit, we may have to convert it to a phone-only visit.  In either situation, we cannot ensure that we have a secure connection.   vi. Are you willing to proceed?"  STAFF: Did the patient verbally acknowledge consent to telehealth visit? Document YES/NO here: YES  2. I advised the patient to be  prepared - I asked that the patient, on the day of his visit, record any information possible with the equipment at his home, such as blood pressure, pulse, oxygen saturation, and your weight and write them all down. I asked the patient to have a pen and paper handy nearby the day of the visit as well.  3. If the patient was scheduled for a video visit, I informed the patient that the visit with the doctor would start with a text to the smartphone # given to [place] by the patient.         If the patient was scheduled for a telephone call, I informed the patient that the visit with the doctor would start with a call to the telephone # given to [place] by the patient.  4. I Informed patient they will receive a phone call 15 minutes prior to their appointment time from a [place] or nurse to review medications, allergies, etc. to prepare for the visit.    TELEPHONE CALL NOTE  Peter [name] has been deemed a candidate for a follow-up tele-health visit to limit community exposure during the Covid-19 pandemic. I spoke with the patient via phone to ensure availability of phone/video source, confirm preferred email & phone number, and discuss instructions and expectations.  I reminded Peter [name] to be prepared with any vital sign and/or  heart rhythm information that could potentially be obtained via home monitoring, at the time of his visit. I reminded Peter [name] to expect a phone call prior to his visit.  Cleaster Corin, NT 11/11/2019 2:42 PM     FULL LENGTH CONSENT FOR TELE-HEALTH VISIT   I hereby voluntarily request, consent and authorize [facility] and its employed or contracted physicians, physician assistants, nurse practitioners or other licensed health care professionals (the Practitioner), to provide me with telemedicine health care services (the "Services") as deemed necessary by the treating Practitioner. I acknowledge and consent to receive the Services by the Practitioner via telemedicine.  I understand that the telemedicine visit will involve communicating with the Practitioner through live audiovisual communication technology and the disclosure of certain medical information by electronic transmission. I acknowledge that I have been given the opportunity to request an in-person assessment or other available alternative prior to the telemedicine visit and am voluntarily participating in the telemedicine visit.  I understand that I have the right to withhold or withdraw my consent to the use of telemedicine in the course of my care at any time, without affecting my right to future care or treatment, and that the Practitioner or I may terminate the telemedicine visit at any time. I understand that I have the right to inspect all information obtained and/or recorded in the course of the telemedicine visit and may receive copies of available information for a reasonable fee.  I understand that some of the potential risks of receiving the Services via telemedicine include:  Marland Kitchen Delay or interruption in medical evaluation due to technological equipment failure or disruption; . Information transmitted may not be sufficient (e.g. poor resolution of images) to allow for appropriate medical decision making by the Practitioner; and/or  . In rare instances, security protocols could fail, causing a breach of personal health information.  Furthermore, I acknowledge that it is my responsibility to provide information about my medical history, conditions and care that is complete and accurate to the best of my ability. I acknowledge that Practitioner's advice, recommendations, and/or decision may be based on factors not within their control, such as incomplete or inaccurate data provided by me or distortions of diagnostic images or specimens that may result from electronic transmissions. I understand that the practice of medicine is not an exact science and that Practitioner makes no warranties or guarantees  regarding treatment outcomes. I acknowledge that I will receive a copy of this consent concurrently upon execution via email to the email address I last provided but may also request a printed copy by calling the office of [place].    I understand that my insurance will be billed for this visit.   I have read or had this consent read to me. . I understand the contents of this consent, which adequately explains the benefits and risks of the Services being provided via telemedicine.  . I have been provided ample opportunity to ask questions regarding this consent and the Services and have had my questions answered to my satisfaction. . I give my informed consent for the services to be provided through the use of telemedicine in my medical care  By participating in this telemedicine visit I agree to the above.

## 2019-11-15 ENCOUNTER — Encounter: Payer: Self-pay | Admitting: Vascular Surgery

## 2019-11-15 ENCOUNTER — Other Ambulatory Visit: Payer: Self-pay

## 2019-11-15 ENCOUNTER — Ambulatory Visit (INDEPENDENT_AMBULATORY_CARE_PROVIDER_SITE_OTHER): Payer: Medicare Other | Admitting: Vascular Surgery

## 2019-11-15 DIAGNOSIS — I714 Abdominal aortic aneurysm, without rupture, unspecified: Secondary | ICD-10-CM

## 2019-11-15 NOTE — Progress Notes (Signed)
Virtual Visit via Telephone Note   I connected with Peter [name] on 11/15/2019 using the [email or web address] by telephone and verified that I was speaking with the correct person using two identifiers.    The limitations of evaluation and management by telemedicine and the availability of in person appointments have been previously discussed with the patient and are documented in the patients chart. The patient expressed understanding and consented to proceed.  PCP: Kathyrn Drown, MD   Chief Complaint: Abdominal aortic aneurysm  History of Present Illness: Peter [name] is a 78 y.o. male with history of known abdominal aortic aneurysm.  Also has history of coronary artery disease.  Denies any back or abdominal pain no issues with walking.  This visit is performed virtually via telephone given the limitations of COVID-19.  Patient is recent undergone abdominal aortic aneurysm ultrasound.  Past Medical History:  Diagnosis Date  . AAA (abdominal aortic aneurysm) ([place])   . CAD (coronary artery disease)    a. s/p CABG in 1997 b. stent to SVG-PDA and PTCA of LAD in 2003 c. cath in 2015 showing patent SVG-OM1-OM2, SVG-RI, SVG-D1, and LIMA-LAD with occluded SVG-PDA and severe stenosis of mid-LAD at LIMA insertion with DES placed d. 04/2018: similar results to 2015 with patent LAD stent; occlusion of small caliber D1 stenosis with medical management recommended.   . Chronic back pain 07/25/2018   Chronic lumbar pain uses hydrocodone intermittently  . Chronic systolic CHF (congestive heart failure) ([place])   . COPD (chronic obstructive pulmonary disease) ([place])   . Diabetes mellitus    diet controlled  . Dyslipidemia   . Hypertension   . Myocardial infarction [facility]) 1997   stents x2 (2003/2007)    Past Surgical History:  Procedure Laterality Date  . CORONARY ANGIOPLASTY  2003/2007   2 stents  . CORONARY ANGIOPLASTY WITH STENT PLACEMENT  08/14/14   DES to LIMA-LAD insertion  . CORONARY ARTERY  BYPASS GRAFT  1997   7 vessels  . CORONARY STENT PLACEMENT  08/15/2014   MID LAD  DES  by Dr Burt Knack  . CYSTECTOMY  2005   top of head-Jen[place]  . EAR CYST EXCISION  07/09/2012   Procedure: CYST REMOVAL;  Surgeon: Jamesetta So, MD;  Location: [facility];  Service: General;  Laterality: N/A;  . LEFT HEART CATH AND CORS/GRAFTS ANGIOGRAPHY N/A 04/16/2018   Procedure: LEFT HEART CATH AND CORS/GRAFTS ANGIOGRAPHY;  Surgeon: Martinique, Peter M, MD;  Location: [facility] CV LAB;  Service: Cardiovascular;  Laterality: N/A;  . LEFT HEART CATHETERIZATION WITH CORONARY/GRAFT ANGIOGRAM N/A 08/14/2014   Procedure: LEFT HEART CATHETERIZATION WITH Beatrix Fetters;  Surgeon: Blane Ohara, MD;  [facility] C[facility] LAB;  Service: Cardiovascular;  Laterality: N/A;    No outpatient medications have been marked as taking for the 11/15/19 encounter (Office Visit) with Waynetta Sandy, MD.    12 system ROS was negative unless otherwise noted in HPI   Observations/Objective: No limitations evident regarding our exam today  Abdominal Ultrasound IMPRESSION: 4.7 cm infrarenal abdominal aortic aneurysm is noted which is not significantly changed compared to prior exam. Recommend followup by abdomen and pelvis CTA in 6 months, and vascular surgery referral/consultation if not already obtained. This recommendation follows ACR consensus guidelines: White Paper of the ACR Incidental Findings Committee II on Vascular Findings. J Am Coll Radiol 2013; 10:789-794.  Assessment and Plan: 78 year old male with known abdominal aortic aneurysm now measuring 4.7 cm.  We will follow him up  with 3-month interval ultrasound.  Should he have back or abdominal issues prior to that I will certainly plan to evaluate him sooner and he demonstrates good understanding of this.   Follow Up Instructions:   Follow up 6 months   I discussed the assessment and treatment plan with the patient. The patient was provided an  opportunity to ask questions and all were answered. The patient agreed with the plan and demonstrated an understanding of the instructions.   The patient was advised to call back or seek an in-person evaluation if the symptoms worsen or if the condition fails to improve as anticipated.  I spent 12 minutes with the patient via telephone encounter and in preparation.    Signed, [name] [facility] Office: [phone]  11/15/2019, 9:12 AM

## 2019-12-04 ENCOUNTER — Other Ambulatory Visit: Payer: Self-pay | Admitting: *Deleted

## 2019-12-04 DIAGNOSIS — I714 Abdominal aortic aneurysm, without rupture, unspecified: Secondary | ICD-10-CM

## 2019-12-05 MED FILL — HYDROCODON-APAP 10-325: 10-325 | 20 days supply | Qty: 60 | Fill #0

## 2020-01-21 ENCOUNTER — Telehealth: Payer: Self-pay | Admitting: Family Medicine

## 2020-01-21 NOTE — Telephone Encounter (Signed)
Pt would like refill on HYDROcodone-acetaminophen (NORCO) 10-325 MG tablet    [facility] AT [place], [place] [address]

## 2020-01-22 ENCOUNTER — Other Ambulatory Visit: Payer: Self-pay | Admitting: Family Medicine

## 2020-01-22 MED ORDER — HYDROCODONE-ACETAMINOPHEN 10-325 MG PO TABS: ORAL_TABLET | ORAL | 0 refills | Status: DC

## 2020-01-22 NOTE — Telephone Encounter (Signed)
Patient notified

## 2020-01-22 NOTE — Telephone Encounter (Signed)
Prescription was sent to the requested pharmacy this morning

## 2020-01-28 ENCOUNTER — Other Ambulatory Visit: Payer: Self-pay | Admitting: Family Medicine

## 2020-01-28 DIAGNOSIS — E1142 Type 2 diabetes mellitus with diabetic polyneuropathy: Secondary | ICD-10-CM

## 2020-01-28 DIAGNOSIS — E785 Hyperlipidemia, unspecified: Secondary | ICD-10-CM

## 2020-01-28 DIAGNOSIS — Z23 Encounter for immunization: Secondary | ICD-10-CM

## 2020-02-24 ENCOUNTER — Encounter (HOSPITAL_COMMUNITY): Payer: Self-pay | Admitting: *Deleted

## 2020-02-24 ENCOUNTER — Other Ambulatory Visit: Payer: Self-pay

## 2020-02-24 ENCOUNTER — Encounter (HOSPITAL_COMMUNITY)
Admission: EM | Disposition: A | Discharge status: Home / Self Care | Payer: Self-pay | Source: Home / Self Care | Attending: Internal Medicine

## 2020-02-24 ENCOUNTER — Emergency Department (HOSPITAL_COMMUNITY): Payer: Medicare Other

## 2020-02-24 ENCOUNTER — Observation Stay (HOSPITAL_COMMUNITY): Payer: Medicare Other

## 2020-02-24 ENCOUNTER — Inpatient Hospital Stay (HOSPITAL_COMMUNITY)
Admission: EM | Admit: 2020-02-24 | Discharge: 2020-02-26 | DRG: 247 | Disposition: A | Discharge status: Home / Self Care | Payer: Medicare Other | Attending: Cardiovascular Disease | Admitting: Cardiovascular Disease

## 2020-02-24 DIAGNOSIS — M545 Low back pain: Secondary | ICD-10-CM | POA: Diagnosis present

## 2020-02-24 DIAGNOSIS — I447 Left bundle-branch block, unspecified: Secondary | ICD-10-CM | POA: Diagnosis not present

## 2020-02-24 DIAGNOSIS — I10 Essential (primary) hypertension: Secondary | ICD-10-CM | POA: Diagnosis not present

## 2020-02-24 DIAGNOSIS — I351 Nonrheumatic aortic (valve) insufficiency: Secondary | ICD-10-CM | POA: Diagnosis not present

## 2020-02-24 DIAGNOSIS — Z888 Allergy status to other drugs, medicaments and biological substances status: Secondary | ICD-10-CM | POA: Diagnosis not present

## 2020-02-24 DIAGNOSIS — I200 Unstable angina: Secondary | ICD-10-CM

## 2020-02-24 DIAGNOSIS — I25810 Atherosclerosis of coronary artery bypass graft(s) without angina pectoris: Secondary | ICD-10-CM

## 2020-02-24 DIAGNOSIS — I714 Abdominal aortic aneurysm, without rupture: Secondary | ICD-10-CM | POA: Diagnosis not present

## 2020-02-24 DIAGNOSIS — Z955 Presence of coronary angioplasty implant and graft: Secondary | ICD-10-CM

## 2020-02-24 DIAGNOSIS — R0789 Other chest pain: Secondary | ICD-10-CM | POA: Diagnosis not present

## 2020-02-24 DIAGNOSIS — Z87891 Personal history of nicotine dependence: Secondary | ICD-10-CM

## 2020-02-24 DIAGNOSIS — Z20822 Contact with and (suspected) exposure to covid-19: Secondary | ICD-10-CM | POA: Diagnosis present

## 2020-02-24 DIAGNOSIS — Z743 Need for continuous supervision: Secondary | ICD-10-CM | POA: Diagnosis not present

## 2020-02-24 DIAGNOSIS — Z9102 Food additives allergy status: Secondary | ICD-10-CM | POA: Diagnosis not present

## 2020-02-24 DIAGNOSIS — R0689 Other abnormalities of breathing: Secondary | ICD-10-CM | POA: Diagnosis not present

## 2020-02-24 DIAGNOSIS — I5022 Chronic systolic (congestive) heart failure: Secondary | ICD-10-CM

## 2020-02-24 DIAGNOSIS — J449 Chronic obstructive pulmonary disease, unspecified: Secondary | ICD-10-CM | POA: Diagnosis not present

## 2020-02-24 DIAGNOSIS — R079 Chest pain, unspecified: Secondary | ICD-10-CM | POA: Diagnosis present

## 2020-02-24 DIAGNOSIS — E785 Hyperlipidemia, unspecified: Secondary | ICD-10-CM | POA: Diagnosis present

## 2020-02-24 DIAGNOSIS — Z91041 Radiographic dye allergy status: Secondary | ICD-10-CM | POA: Diagnosis not present

## 2020-02-24 DIAGNOSIS — Z8249 Family history of ischemic heart disease and other diseases of the circulatory system: Secondary | ICD-10-CM | POA: Diagnosis not present

## 2020-02-24 DIAGNOSIS — I214 Non-ST elevation (NSTEMI) myocardial infarction: Secondary | ICD-10-CM | POA: Diagnosis not present

## 2020-02-24 DIAGNOSIS — R52 Pain, unspecified: Secondary | ICD-10-CM | POA: Diagnosis not present

## 2020-02-24 DIAGNOSIS — I252 Old myocardial infarction: Secondary | ICD-10-CM | POA: Diagnosis not present

## 2020-02-24 DIAGNOSIS — I2510 Atherosclerotic heart disease of native coronary artery without angina pectoris: Secondary | ICD-10-CM | POA: Diagnosis not present

## 2020-02-24 DIAGNOSIS — Z951 Presence of aortocoronary bypass graft: Secondary | ICD-10-CM | POA: Diagnosis not present

## 2020-02-24 DIAGNOSIS — I110 Hypertensive heart disease with heart failure: Secondary | ICD-10-CM | POA: Diagnosis not present

## 2020-02-24 DIAGNOSIS — G8929 Other chronic pain: Secondary | ICD-10-CM | POA: Diagnosis not present

## 2020-02-24 DIAGNOSIS — I25110 Atherosclerotic heart disease of native coronary artery with unstable angina pectoris: Secondary | ICD-10-CM | POA: Diagnosis not present

## 2020-02-24 DIAGNOSIS — R000 Tachycardia, unspecified: Secondary | ICD-10-CM | POA: Diagnosis not present

## 2020-02-24 HISTORY — PX: CORONARY STENT INTERVENTION: CATH118234

## 2020-02-24 HISTORY — PX: CORONARY/GRAFT ANGIOGRAPHY: CATH118237

## 2020-02-24 LAB — COMPREHENSIVE METABOLIC PANEL
ALT: 31 U/L (ref 0–44)
AST: 29 U/L (ref 15–41)
Albumin: 4 g/dL (ref 3.5–5.0)
Alkaline Phosphatase: 75 U/L (ref 38–126)
Anion gap: 12 (ref 5–15)
BUN: 19 mg/dL (ref 8–23)
CO2: 25 mmol/L (ref 22–32)
Calcium: 9.6 mg/dL (ref 8.9–10.3)
Chloride: 98 mmol/L (ref 98–111)
Creatinine, Ser: 1.16 mg/dL (ref 0.61–1.24)
GFR calc Af Amer: >60 mL/min (ref >60)
GFR calc non Af Amer: >60 mL/min (ref >60)
Glucose, Bld: 178 mg/dL — ABNORMAL HIGH (ref 70–99)
Potassium: 4.1 mmol/L (ref 3.5–5.1)
Sodium: 135 mmol/L (ref 135–145)
Total Bilirubin: 1.1 mg/dL (ref 0.3–1.2)
Total Protein: 7.7 g/dL (ref 6.5–8.1)

## 2020-02-24 LAB — CBC WITH DIFFERENTIAL/PLATELET
Abs Immature Granulocytes: 0.03 10*3/uL (ref 0.00–0.07)
Basophils Absolute: 0.1 10*3/uL (ref 0.0–0.1)
Basophils Relative: 1 %
Eosinophils Absolute: 0.3 10*3/uL (ref 0.0–0.5)
Eosinophils Relative: 3 %
HCT: 45.7 % (ref 39.0–52.0)
Hemoglobin: 15.3 g/dL (ref 13.0–17.0)
Immature Granulocytes: 0 %
Lymphocytes Relative: 27 %
Lymphs Abs: 2.7 10*3/uL (ref 0.7–4.0)
MCH: 32.2 pg (ref 26.0–34.0)
MCHC: 33.5 g/dL (ref 30.0–36.0)
MCV: 96.2 fL (ref 80.0–100.0)
Monocytes Absolute: 0.7 10*3/uL (ref 0.1–1.0)
Monocytes Relative: 7 %
Neutro Abs: 6.4 10*3/uL (ref 1.7–7.7)
Neutrophils Relative %: 62 %
Platelets: 149 10*3/uL — ABNORMAL LOW (ref 150–400)
RBC: 4.75 MIL/uL (ref 4.22–5.81)
RDW: 13.1 % (ref 11.5–15.5)
WBC: 10.2 10*3/uL (ref 4.0–10.5)
nRBC: 0 % (ref 0.0–0.2)

## 2020-02-24 LAB — CBC
HCT: 43.5 % (ref 39.0–52.0)
Hemoglobin: 15.2 g/dL (ref 13.0–17.0)
MCH: 32.5 pg (ref 26.0–34.0)
MCHC: 34.9 g/dL (ref 30.0–36.0)
MCV: 92.9 fL (ref 80.0–100.0)
Platelets: 133 10*3/uL — ABNORMAL LOW (ref 150–400)
RBC: 4.68 MIL/uL (ref 4.22–5.81)
RDW: 12.9 % (ref 11.5–15.5)
WBC: 10.3 10*3/uL (ref 4.0–10.5)
nRBC: 0 % (ref 0.0–0.2)

## 2020-02-24 LAB — GLUCOSE, CAPILLARY: Glucose-Capillary: 232 mg/dL — ABNORMAL HIGH (ref 70–99)

## 2020-02-24 LAB — POCT ACTIVATED CLOTTING TIME: Activated Clotting Time: 411 seconds

## 2020-02-24 LAB — LIPASE, BLOOD: Lipase: 23 U/L (ref 11–51)

## 2020-02-24 LAB — CREATININE, SERUM
Creatinine, Ser: 1.12 mg/dL (ref 0.61–1.24)
GFR calc Af Amer: >60 mL/min (ref >60)
GFR calc non Af Amer: >60 mL/min (ref >60)

## 2020-02-24 LAB — RESPIRATORY PANEL BY RT PCR (FLU A&B, COVID)
Influenza A by PCR: NEGATIVE
Influenza B by PCR: NEGATIVE
SARS Coronavirus 2 by RT PCR: NEGATIVE

## 2020-02-24 LAB — BRAIN NATRIURETIC PEPTIDE: B Natriuretic Peptide: 220 pg/mL — ABNORMAL HIGH (ref 0.0–100.0)

## 2020-02-24 LAB — TROPONIN I (HIGH SENSITIVITY)
Troponin I (High Sensitivity): 202 ng/L (ref <18)
Troponin I (High Sensitivity): 742 ng/L (ref <18)
Troponin I (High Sensitivity): 1543 ng/L (ref <18)

## 2020-02-24 LAB — CBG MONITORING, ED: Glucose-Capillary: 118 mg/dL — ABNORMAL HIGH (ref 70–99)

## 2020-02-24 SURGERY — CORONARY/GRAFT ANGIOGRAPHY
Anesthesia: LOCAL

## 2020-02-24 MED ORDER — METHYLPREDNISOLONE SODIUM SUCC 125 MG IJ SOLR
125.0000 mg | Freq: Once | INTRAMUSCULAR | Status: AC
  Administered 2020-02-24: 125 mg via INTRAVENOUS
  Filled 2020-02-24: qty 2

## 2020-02-24 MED ORDER — ONDANSETRON HCL 4 MG/2ML IJ SOLN: 4.0000 mg | Freq: Four times a day (QID) | INTRAMUSCULAR | Status: DC | PRN

## 2020-02-24 MED ORDER — IOHEXOL 350 MG/ML SOLN
INTRAVENOUS | Status: DC | PRN
  Administered 2020-02-24: 150 mL

## 2020-02-24 MED ORDER — METOPROLOL SUCCINATE ER 25 MG PO TB24
25.0000 mg | ORAL_TABLET | Freq: Every day | ORAL | Status: DC
  Administered 2020-02-25 – 2020-02-26 (×2): 25 mg via ORAL
  Filled 2020-02-24 (×2): qty 1

## 2020-02-24 MED ORDER — ATORVASTATIN CALCIUM 80 MG PO TABS
80.0000 mg | ORAL_TABLET | Freq: Every day | ORAL | Status: DC
  Administered 2020-02-25: 80 mg via ORAL
  Filled 2020-02-24 (×2): qty 1

## 2020-02-24 MED ORDER — ASPIRIN 81 MG PO CHEW
81.0000 mg | CHEWABLE_TABLET | Freq: Every day | ORAL | Status: DC
  Administered 2020-02-25 – 2020-02-26 (×2): 81 mg via ORAL
  Filled 2020-02-24 (×2): qty 1

## 2020-02-24 MED ORDER — SODIUM CHLORIDE 0.9 % WEIGHT BASED INFUSION
1.0000 mL/kg/h | INTRAVENOUS | Status: DC
  Administered 2020-02-24: 1 mL/kg/h via INTRAVENOUS

## 2020-02-24 MED ORDER — TICAGRELOR 90 MG PO TABS
ORAL_TABLET | ORAL | Status: DC | PRN
  Administered 2020-02-24: 180 mg via ORAL

## 2020-02-24 MED ORDER — MORPHINE SULFATE (PF) 2 MG/ML IV SOLN
2.0000 mg | INTRAVENOUS | Status: DC | PRN
  Administered 2020-02-24 (×3): 2 mg via INTRAVENOUS
  Filled 2020-02-24 (×2): qty 1

## 2020-02-24 MED ORDER — NITROGLYCERIN 1 MG/10 ML FOR IR/CATH LAB
INTRA_ARTERIAL | Status: AC
  Filled 2020-02-24: qty 10

## 2020-02-24 MED ORDER — LABETALOL HCL 5 MG/ML IV SOLN
INTRAVENOUS | Status: AC
  Filled 2020-02-24: qty 4

## 2020-02-24 MED ORDER — SODIUM CHLORIDE 0.9 % WEIGHT BASED INFUSION: 3.0000 mL/kg/h | INTRAVENOUS | Status: DC

## 2020-02-24 MED ORDER — BIVALIRUDIN BOLUS VIA INFUSION - CUPID
INTRAVENOUS | Status: DC | PRN
  Administered 2020-02-24: 64.65 mg via INTRAVENOUS

## 2020-02-24 MED ORDER — BISACODYL 10 MG RE SUPP
10.0000 mg | Freq: Once | RECTAL | Status: AC
  Administered 2020-02-24: 10 mg via RECTAL
  Filled 2020-02-24: qty 1

## 2020-02-24 MED ORDER — MIDAZOLAM HCL 2 MG/2ML IJ SOLN
INTRAMUSCULAR | Status: AC
  Filled 2020-02-24: qty 2

## 2020-02-24 MED ORDER — HEPARIN (PORCINE) IN NACL 1000-0.9 UT/500ML-% IV SOLN
INTRAVENOUS | Status: DC | PRN
  Administered 2020-02-24 (×2): 500 mL

## 2020-02-24 MED ORDER — VERAPAMIL HCL 2.5 MG/ML IV SOLN
INTRAVENOUS | Status: AC
  Filled 2020-02-24: qty 2

## 2020-02-24 MED ORDER — LOSARTAN POTASSIUM 25 MG PO TABS
25.0000 mg | ORAL_TABLET | Freq: Every day | ORAL | Status: DC
  Administered 2020-02-24 – 2020-02-26 (×3): 25 mg via ORAL
  Filled 2020-02-24 (×3): qty 1

## 2020-02-24 MED ORDER — DIPHENHYDRAMINE HCL 50 MG/ML IJ SOLN
25.0000 mg | Freq: Once | INTRAMUSCULAR | Status: AC
  Administered 2020-02-24: 25 mg via INTRAVENOUS
  Filled 2020-02-24: qty 1

## 2020-02-24 MED ORDER — LIDOCAINE VISCOUS HCL 2 % MT SOLN
15.0000 mL | Freq: Once | OROMUCOSAL | Status: AC
  Administered 2020-02-24: 15 mL via ORAL
  Filled 2020-02-24: qty 15

## 2020-02-24 MED ORDER — VERAPAMIL HCL 2.5 MG/ML IV SOLN
INTRAVENOUS | Status: DC | PRN
  Administered 2020-02-24 (×2): 200 ug via INTRACORONARY
  Administered 2020-02-24: 300 ug via INTRACORONARY
  Administered 2020-02-24: 400 ug via INTRACORONARY
  Administered 2020-02-24 (×3): 300 ug via INTRACORONARY
  Administered 2020-02-24: 200 ug via INTRACORONARY
  Administered 2020-02-24: 400 ug via INTRACORONARY

## 2020-02-24 MED ORDER — HEPARIN SODIUM (PORCINE) 5000 UNIT/ML IJ SOLN: 5000.0000 [IU] | Freq: Three times a day (TID) | INTRAMUSCULAR | Status: DC

## 2020-02-24 MED ORDER — SODIUM CHLORIDE 0.9% FLUSH: 3.0000 mL | INTRAVENOUS | Status: DC | PRN

## 2020-02-24 MED ORDER — SODIUM CHLORIDE 0.9 % IV SOLN
INTRAVENOUS | Status: DC | PRN
  Administered 2020-02-24: 1.75 mg/kg/h via INTRAVENOUS

## 2020-02-24 MED ORDER — HYDRALAZINE HCL 20 MG/ML IJ SOLN: 10.0000 mg | INTRAMUSCULAR | Status: AC | PRN

## 2020-02-24 MED ORDER — HEPARIN (PORCINE) IN NACL 1000-0.9 UT/500ML-% IV SOLN
INTRAVENOUS | Status: AC
  Filled 2020-02-24: qty 1000

## 2020-02-24 MED ORDER — LABETALOL HCL 5 MG/ML IV SOLN
10.0000 mg | INTRAVENOUS | Status: AC | PRN
  Administered 2020-02-24: 10 mg via INTRAVENOUS

## 2020-02-24 MED ORDER — TICAGRELOR 90 MG PO TABS
ORAL_TABLET | ORAL | Status: AC
  Filled 2020-02-24: qty 2

## 2020-02-24 MED ORDER — LIDOCAINE HCL (PF) 1 % IJ SOLN
INTRAMUSCULAR | Status: AC
  Filled 2020-02-24: qty 30

## 2020-02-24 MED ORDER — NITROGLYCERIN IN D5W 200-5 MCG/ML-% IV SOLN
5.0000 ug/min | INTRAVENOUS | Status: DC
  Administered 2020-02-24: 5 ug/min via INTRAVENOUS
  Filled 2020-02-24: qty 250

## 2020-02-24 MED ORDER — HYDROCODONE-ACETAMINOPHEN 10-325 MG PO TABS: 1.0000 | ORAL_TABLET | Freq: Four times a day (QID) | ORAL | Status: DC | PRN

## 2020-02-24 MED ORDER — MORPHINE SULFATE (PF) 2 MG/ML IV SOLN: 2.0000 mg | Freq: Once | INTRAVENOUS | Status: AC

## 2020-02-24 MED ORDER — ALUM & MAG HYDROXIDE-SIMETH 200-200-20 MG/5ML PO SUSP
30.0000 mL | Freq: Once | ORAL | Status: AC
  Administered 2020-02-24: 30 mL via ORAL
  Filled 2020-02-24: qty 30

## 2020-02-24 MED ORDER — SODIUM CHLORIDE 0.9 % WEIGHT BASED INFUSION: 1.0000 mL/kg/h | INTRAVENOUS | Status: DC

## 2020-02-24 MED ORDER — MORPHINE SULFATE (PF) 2 MG/ML IV SOLN
INTRAVENOUS | Status: AC
  Filled 2020-02-24: qty 1

## 2020-02-24 MED ORDER — NITROGLYCERIN 1 MG/10 ML FOR IR/CATH LAB
INTRA_ARTERIAL | Status: DC | PRN
  Administered 2020-02-24 (×3): 200 ug via INTRACORONARY

## 2020-02-24 MED ORDER — ASPIRIN 81 MG PO CHEW: 81.0000 mg | CHEWABLE_TABLET | ORAL | Status: DC

## 2020-02-24 MED ORDER — MIDAZOLAM HCL 2 MG/2ML IJ SOLN
INTRAMUSCULAR | Status: DC | PRN
  Administered 2020-02-24 (×2): 1 mg via INTRAVENOUS

## 2020-02-24 MED ORDER — LIDOCAINE HCL (PF) 1 % IJ SOLN
INTRAMUSCULAR | Status: DC | PRN
  Administered 2020-02-24: 15 mL

## 2020-02-24 MED ORDER — TICAGRELOR 90 MG PO TABS
90.0000 mg | ORAL_TABLET | Freq: Two times a day (BID) | ORAL | Status: DC
  Administered 2020-02-24 – 2020-02-26 (×4): 90 mg via ORAL
  Filled 2020-02-24 (×4): qty 1

## 2020-02-24 MED ORDER — ACETAMINOPHEN 325 MG PO TABS: 650.0000 mg | ORAL_TABLET | ORAL | Status: DC | PRN

## 2020-02-24 MED ORDER — HEPARIN BOLUS VIA INFUSION
4000.0000 [IU] | Freq: Once | INTRAVENOUS | Status: AC
  Administered 2020-02-24: 4000 [IU] via INTRAVENOUS

## 2020-02-24 MED ORDER — MORPHINE SULFATE (PF) 2 MG/ML IV SOLN
2.0000 mg | Freq: Once | INTRAVENOUS | Status: AC
  Administered 2020-02-24: 2 mg via INTRAVENOUS
  Filled 2020-02-24: qty 1

## 2020-02-24 MED ORDER — HEPARIN (PORCINE) 25000 UT/250ML-% IV SOLN
1100.0000 [IU]/h | INTRAVENOUS | Status: DC
  Administered 2020-02-24: 1100 [IU]/h via INTRAVENOUS
  Filled 2020-02-24: qty 250

## 2020-02-24 MED ORDER — SODIUM CHLORIDE 0.9 % WEIGHT BASED INFUSION
1.0000 mL/kg/h | INTRAVENOUS | Status: AC
  Administered 2020-02-24: 1 mL/kg/h via INTRAVENOUS

## 2020-02-24 MED ORDER — BIVALIRUDIN TRIFLUOROACETATE 250 MG IV SOLR
INTRAVENOUS | Status: AC
  Filled 2020-02-24: qty 250

## 2020-02-24 MED ORDER — FENTANYL CITRATE (PF) 100 MCG/2ML IJ SOLN
INTRAMUSCULAR | Status: AC
  Filled 2020-02-24: qty 2

## 2020-02-24 MED ORDER — FENTANYL CITRATE (PF) 100 MCG/2ML IJ SOLN
INTRAMUSCULAR | Status: DC | PRN
  Administered 2020-02-24: 50 ug via INTRAVENOUS
  Administered 2020-02-24 (×2): 25 ug via INTRAVENOUS

## 2020-02-24 MED ORDER — IOHEXOL 350 MG/ML SOLN
INTRAVENOUS | Status: AC
  Filled 2020-02-24: qty 1

## 2020-02-24 MED ORDER — HEPARIN (PORCINE) 25000 UT/250ML-% IV SOLN
1150.0000 [IU]/h | INTRAVENOUS | Status: DC
  Administered 2020-02-25: 1000 [IU]/h via INTRAVENOUS
  Filled 2020-02-24: qty 250

## 2020-02-24 MED ORDER — SODIUM CHLORIDE 0.9% FLUSH
3.0000 mL | Freq: Two times a day (BID) | INTRAVENOUS | Status: DC
  Administered 2020-02-25 – 2020-02-26 (×3): 3 mL via INTRAVENOUS

## 2020-02-24 MED ORDER — SODIUM CHLORIDE 0.9 % IV SOLN: 250.0000 mL | INTRAVENOUS | Status: DC | PRN

## 2020-02-24 SURGICAL SUPPLY — 22 items
BALLN SAPPHIRE 2.5X12 (BALLOONS) ×2
BALLOON SAPPHIRE 2.5X12 (BALLOONS) ×1 IMPLANT
CATH DXT MULTI JL4 JR4 ANG PIG (CATHETERS) ×2 IMPLANT
CATH INFINITI 5 FR IM (CATHETERS) ×2 IMPLANT
CATH INFINITI 5FR AL1 (CATHETERS) ×2 IMPLANT
CATH LAUNCHER 6FR AL1 (CATHETERS) ×1 IMPLANT
CATHETER LAUNCHER 6FR AL1 (CATHETERS) ×2
DEVICE CLOSURE PERCLS PRGLD 6F (VASCULAR PRODUCTS) ×1 IMPLANT
DEVICE SPIDERFX EMB PROT 5MM (WIRE) ×2 IMPLANT
GLIDESHEATH SLEND SS 6F .021 (SHEATH) IMPLANT
KIT ENCORE 26 ADVANTAGE (KITS) ×2 IMPLANT
KIT HEART LEFT (KITS) ×2 IMPLANT
PACK CARDIAC CATHETERIZATION (CUSTOM PROCEDURE TRAY) ×2 IMPLANT
PERCLOSE PROGLIDE 6F (VASCULAR PRODUCTS) ×2
SHEATH PINNACLE 5F 10CM (SHEATH) ×2 IMPLANT
SHEATH PINNACLE 6F 10CM (SHEATH) ×2 IMPLANT
STENT RESOLUTE ONYX 3.5X15 (Permanent Stent) ×2 IMPLANT
STENT RESOLUTE ONYX 3.5X26 (Permanent Stent) ×2 IMPLANT
TRANSDUCER W/STOPCOCK (MISCELLANEOUS) ×2 IMPLANT
TUBING CIL FLEX 10 FLL-RA (TUBING) ×2 IMPLANT
WIRE COUGAR XT STRL 190CM (WIRE) ×2 IMPLANT
WIRE HI TORQ VERSACORE-J 145CM (WIRE) ×2 IMPLANT

## 2020-02-24 NOTE — ED Notes (Signed)
Date and time results received: 02/24/20 1126 (use smartphrase ".now" to insert current time)  Test: troponin Critical Value: 1543  Name of Provider Notified: Dr. Joesph Fillers and Dr. Harl Bowie  Orders Received? Or Actions Taken?:  Continue current plan to [facility] to [facility].

## 2020-02-24 NOTE — Progress Notes (Signed)
  Patient seen and evaluated, chart reviewed, please see EMR for updated orders. Please see full H&P dictated by admitting physician Dr Harl Bowie (cardiology) for same date of service.   ----  Discussed with Pt's son----questions answered---  -- Discussed with Dr. Harl Bowie ---cardiology service will take over case - Hospitalist service will sign off  Please recall Hospitalist service as needed  Roxan Hockey, MD

## 2020-02-24 NOTE — Consult Note (Addendum)
Cardiology Consultation:   Patient ID: Peter [name] MRN: [id number]; DOB: 1941-04-27  Admit date: 02/24/2020 Date of Consult: 02/24/2020  Primary Care Provider: Kathyrn Drown, MD Primary Cardiologist: Peter Dolly, MD  Primary Electrophysiologist:  None    Patient Profile:   Peter [name] is a 79 y.o. male with a hx of CAD who is being seen today for the evaluation of chest pain at the request of Dr .Peter [name]  History of Present Illness:   Peter [name] 79 yo male history of extensive history of CAD with CABG in 1997, stent to SVG-PDA and PTCA of the LAD in 05/2002. 2008 heart cath [facility] had repeat stenting, he is unsure of the details. Cath 2015showing patent SVG-OM1-OM2, SVG-RI, SVG-D1, and LIMA-LAD with occluded SVG-PDA and severe stenosis of mid-LAD at LIMA insertion with DES placed. 04/2018 cathshowed severe 3-vessel CAD with patent LIMA-LAD, patent stent along mid-LAD, patent SVG-OM1-OM2, and patent SVG-RI with occlusion of D1 and known occlusion of SVG-PDA. Thediagonal occlusion was thought tobe old, therefore continued medical therapy was recommended.   History of chronic systolic HF , HTN, HL, COPD. AAA followed by vascular  Medical therapy has been limited by his self termination of medications.   Presents with chest pain. Reports for last several months intermittent chest pressure, he usually he attributed it to eating sweets when the symptoms would come on. Would get better with drinking aloe vera juice at home. On Saturday pain started after eating some donuts. More severe and longer lasting than prior. Would last up to 2-3 hours. No associated SOB. 10/10 pressure midchest, can radiate down both arms, not positional. Ongoign symptoms Sunday and Monday not related to food. This morning around 4AM woke up from sleep with pain that would not go away. Called EMS, given NG with relief. In ER started on NG drip with relief as well.     ER vitals: 181/103 p 74 92%  RA  WBC 10/2 Hgb 15.3 Plt 149 K 4.1 Cr 1.16 Lipase 23 BNP 220 hstrop 202--> EKG SR, LVH, chronic ST/T changes CXR probable pulm edema 04/2018 echo: LVEF 30-35%, grade II diastolic dysfunction, Akinesis of the mid-apical  anterior, basal-mid anteroseptal, mid inferoseptal, apical  septal, and apical myocardium.  Past Medical History:  Diagnosis Date  . AAA (abdominal aortic aneurysm) ([place])   . CAD (coronary artery disease)    a. s/p CABG in 1997 b. stent to SVG-PDA and PTCA of LAD in 2003 c. cath in 2015 showing patent SVG-OM1-OM2, SVG-RI, SVG-D1, and LIMA-LAD with occluded SVG-PDA and severe stenosis of mid-LAD at LIMA insertion with DES placed d. 04/2018: similar results to 2015 with patent LAD stent; occlusion of small caliber D1 stenosis with medical management recommended.   . Chronic back pain 07/25/2018   Chronic lumbar pain uses hydrocodone intermittently  . Chronic systolic CHF (congestive heart failure) ([place])   . COPD (chronic obstructive pulmonary disease) ([place])   . Diabetes mellitus    diet controlled  . Dyslipidemia   . Hypertension   . Myocardial infarction [facility]) 1997   stents x2 (2003/2007)    Past Surgical History:  Procedure Laterality Date  . CORONARY ANGIOPLASTY  2003/2007   2 stents  . CORONARY ANGIOPLASTY WITH STENT PLACEMENT  08/14/14   DES to LIMA-LAD insertion  . CORONARY ARTERY BYPASS GRAFT  1997   7 vessels  . CORONARY STENT PLACEMENT  08/15/2014   MID LAD  DES  by Dr Peter [name]  . CYSTECTOMY  2005  top of head-Peter [name]  . EAR CYST EXCISION  07/09/2012   Procedure: CYST REMOVAL;  Surgeon: Peter So, MD;  Location: [facility];  Service: General;  Laterality: N/A;  . LEFT HEART CATH AND CORS/GRAFTS ANGIOGRAPHY N/A 04/16/2018   Procedure: LEFT HEART CATH AND CORS/GRAFTS ANGIOGRAPHY;  Surgeon: Martinique, Peter M, MD;  Location: [place] CV LAB;  Service: Cardiovascular;  Laterality: N/A;  . LEFT HEART CATHETERIZATION WITH CORONARY/GRAFT ANGIOGRAM N/A 08/14/2014    Procedure: LEFT HEART CATHETERIZATION WITH Peter [name];  Surgeon: Peter Ohara, MD;  Location: [facility] CATH LAB;  Service: Cardiovascular;  Laterality: N/A;     Inpatient Medications: Scheduled Meds:  Continuous Infusions: . nitroGLYCERIN 5 mcg/min (02/24/20 0751)   PRN Meds:   Allergies:    Allergies  Allergen Reactions  . Dye Fdc Red [Red Dye]     hives  . Ivp Dye [Iodinated Diagnostic Agents] Hives  . Keflex [Cephalexin]     hoarsness  . Plavix [Clopidogrel] Hives    Unsure if dye or Plavix    Social History:   Social History   Socioeconomic History  . Marital status: Married    Spouse name: Not on file  . Number of children: Not on file  . Years of education: Not on file  . Highest education level: Not on file  Occupational History  . Not on file  Tobacco Use  . Smoking status: Former Smoker    Packs/day: 3.00    Years: 40.00    Pack years: 120.00    Types: Cigarettes    Quit date: 07/02/2000    Years since quitting: 19.6  . Smokeless tobacco: Never Used  Substance and Sexual Activity  . Alcohol use: No    Alcohol/week: 10.0 standard drinks    Types: 10 Cans of beer per week    Comment: quit 3 months (01/2013). 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  . Drug use: No  . Sexual activity: Never    Birth control/protection: None  Other Topics Concern  . Not on file  Social History Narrative  . Not on file   Social Determinants of Health   Financial Resource Strain:   . Difficulty of Paying Living Expenses:   Food Insecurity:   . Worried About Charity fundraiser in the Last Year:   . Arboriculturist in the Last Year:   Transportation Needs:   . Film/video editor (Medical):   Marland Kitchen Lack of Transportation (Non-Medical):   Physical Activity:   . Days of Exercise per Week:   . Minutes of Exercise per Session:   Stress:   . Feeling of Stress :   Social Connections:   . Frequency of Communication with Friends and  Family:   . Frequency of Social Gatherings with Friends and Family:   . Attends Religious Services:   . Active Member of Clubs or Organizations:   . Attends Archivist Meetings:   Marland Kitchen Marital Status:   Intimate Partner Violence:   . Fear of Current or Ex-Partner:   . Emotionally Abused:   Marland Kitchen Physically Abused:   . Sexually Abused:     Family History:    Family History  Problem Relation Age of Onset  . Other Father        deceased after ?TCS or barium enema, age 71s  . Hypertension Father   . Heart attack Mother   . Colon cancer Neg Hx   . Liver disease  Neg Hx      ROS:  Please see the history of present illness.  All other ROS reviewed and negative.     Physical Exam/Data:   Vitals:   02/24/20 0700 02/24/20 0730 02/24/20 0800 02/24/20 0830  BP: (!) 177/90 (!) 179/93 (!) 189/94 (!) 157/92  Pulse: 63 65 68 63  Resp:  14 10 13   Temp:      TempSrc:      SpO2: 96% 98% 97% 97%  Weight:      Height:        Intake/Output Summary (Last 24 hours) at 02/24/2020 0858 Last data filed at 02/24/2020 0752 Gross per 24 hour  Intake --  Output 550 ml  Net -550 ml   Last 3 Weights 02/24/2020 10/25/2019 08/23/2019  Weight (lbs) 190 lb 188 lb 192 lb  Weight (kg) 86.183 kg 85.276 kg 87.091 kg     Body mass index is 25.77 kg/[name].  General:  Well nourished, well developed, in no acute distress HEENT: normal Lymph: no adenopathy Neck: no JVD Endocrine:  No thryomegaly Vascular: No carotid bruits; FA pulses 2+ bilaterally without bruits  Cardiac:  normal S1, S2; RRR; no murmur  Lungs:  clear to auscultation bilaterally, no wheezing, rhonchi or rales  Abd: soft, nontender, no hepatomegaly  Ext: no edema Musculoskeletal:  No deformities, BUE and BLE strength normal and equal Skin: warm and dry  Neuro:  CNs 2-12 intact, no focal abnormalities noted Psych:  Normal affect    Laboratory Data:  High Sensitivity Troponin:   Recent Labs  Lab 02/24/20 0625  TROPONINIHS 202*      Chemistry Recent Labs  Lab 02/24/20 0625  NA 135  K 4.1  CL 98  CO2 25  GLUCOSE 178*  BUN 19  CREATININE 1.16  CALCIUM 9.6  GFRNONAA >60  GFRAA >60  ANIONGAP 12    Recent Labs  Lab 02/24/20 0625  PROT 7.7  ALBUMIN 4.0  AST 29  ALT 31  ALKPHOS 75  BILITOT 1.1   Hematology Recent Labs  Lab 02/24/20 0625  WBC 10.2  RBC 4.75  HGB 15.3  HCT 45.7  MCV 96.2  MCH 32.2  MCHC 33.5  RDW 13.1  PLT 149*   BNP Recent Labs  Lab 02/24/20 0625  BNP 220.0*    DDimer No results for input(s): DDIMER in the last 168 hours.   Radiology/Studies:  DG Chest Portable 1 View  Result Date: 02/24/2020 CLINICAL DATA:  Chest pain and pressure EXAM: PORTABLE CHEST 1 VIEW COMPARISON:  04/12/2018 FINDINGS: Normal heart size when accounting for mediastinal fat. Mild aortic tortuosity. There has been CABG. Mild interstitial coarsening without visible effusion or pneumothorax. IMPRESSION: Symmetric interstitial prominence, likely mild edema. Electronically Signed   By: Monte Fantasia [name].D.   On: 02/24/2020 06:44       HEAR Score (for undifferentiated chest pain):       Assessment and Plan:   1. CAD/Chest pain - extensive history as reported above with prior CABG and multiple interventions. Medical therapy has been limited due to patient refusal for some meds. - admitted just this morning at 6AM, ongoing workup for ACS - chest pain with some fairly atypical features though did have significant relief with NG, intial trop is 202 -  EKG SR with LVH and chronic ST/T changes. He did present hypertensive, some signs of fluid overload.   - in ER started on NG drip, given morphine 2mg  x1, given GI cocktail - currently pain  free - f/u second troponin, if significant uptrend would plan for transfer to [facility] for cath and management of NSTEMI. F/u echo - medical therapy with ASA 81, toprol 25. He agrees to restart statin, we will start atorva 80mg . Stat hep gtt. Will also start  losartan 25mg  daily.    2. Chronic systolic HF - medical therapy has been limited due to patient refusal for some meds. - dizziness on entresto, he stopped taking. He refused low dose ARB previously, will start at this time as he is open to additional meds currently - appears euvolemic by exam.    3. Hyperlipidemia - he previously stopped his statin on his own, at last visit did not want to restart    Addendum 940AM second trop up to 742, we will plan for transfer to [facility] for left heart cath in setting of NSTEMI, telemetetry bed and cardiology service.     For questions or updates, please contact [place] Please consult [email or web address] for contact info under     Signed, Peter Dolly, MD  02/24/2020 8:58 AM

## 2020-02-24 NOTE — Progress Notes (Addendum)
[place] for heparin IV Indication: ACS/chest pain/NSTEMI  Allergies  Allergen Reactions  . Dye Fdc Red [Red Dye] Hives    hives  . Ivp Dye [Iodinated Diagnostic Agents] Hives  . Plavix [Clopidogrel] Hives    Unsure if dye or Plavix  . Keflex [Cephalexin] Other (See Comments)    hoarsness    Patient Measurements: Height: 6' (182.9 cm) Weight: 190 lb (86.2 kg) IBW/kg (Calculated) : 77.6 Heparin Dosing Weight: 86 kg  Vital Signs: Temp: 97.4 F (36.3 C) (03/22 2102) Temp Source: Oral (03/22 2102) BP: 146/83 (03/22 2017) Pulse Rate: 77 (03/22 2017)  Labs: Recent Labs    02/24/20 0625 02/24/20 0832 02/24/20 1033 02/24/20 1937  HGB 15.3  --   --  15.2  HCT 45.7  --   --  43.5  PLT 149*  --   --  133*  CREATININE 1.16  --   --  1.12  TROPONINIHS 202* 742* 1,543*  --     Estimated Creatinine Clearance: 60.6 mL/min (by C-G formula based on SCr of 1.12 mg/dL).   Medical History: Past Medical History:  Diagnosis Date  . AAA (abdominal aortic aneurysm) ([place])   . CAD (coronary artery disease)    a. s/p CABG in 1997 b. stent to SVG-PDA and PTCA of LAD in 2003 c. cath in 2015 showing patent SVG-OM1-OM2, SVG-RI, SVG-D1, and LIMA-LAD with occluded SVG-PDA and severe stenosis of mid-LAD at LIMA insertion with DES placed d. 04/2018: similar results to 2015 with patent LAD stent; occlusion of small caliber D1 stenosis with medical management recommended.   . Chronic back pain 07/25/2018   Chronic lumbar pain uses hydrocodone intermittently  . Chronic systolic CHF (congestive heart failure) ([place])   . COPD (chronic obstructive pulmonary disease) ([place])   . Diabetes mellitus    diet controlled  . Dyslipidemia   . Hypertension   . Myocardial infarction [facility]) 1997   stents x2 (2003/2007)    Medications:  Medications Prior to Admission  Medication Sig Dispense Refill Last Dose  . Aloe Vera Juice LIQD Take 8 oz by mouth daily.   02/23/2020 at  Unknown time  . aspirin 325 MG tablet Take 325 mg by mouth daily as needed.   02/24/2020 at Unknown time  . HYDROcodone-acetaminophen (NORCO) 10-325 MG tablet Take 1 tablet by mouth 3 (three) times daily as needed. 60 tablet 0 Past Week at Unknown time  . metoprolol succinate (TOPROL-XL) 25 MG 24 hr tablet Take 1 tablet (25 mg total) by mouth daily. 90 tablet 1 02/23/2020 at 1200  . Accu-Chek FastClix Lancets MISC USE 1  TO CHECK GLUCOSE TWICE DAILY DUE TO FLUCTUATING SUGARS (Patient not taking: Reported on 02/24/2020) 204 each 5 Not Taking at Unknown time  . ACCU-CHEK GUIDE test strip USE 1 STRIP TO CHECK GLUCOSE TWICE DAILY DUE  TO  FLUCTUATING  SUGARS (Patient not taking: Reported on 02/24/2020) 100 each 5 Not Taking at Unknown time  . aspirin EC 81 MG tablet Take 1 tablet (81 mg total) by mouth daily. (Patient not taking: Reported on 02/24/2020) 90 tablet 3 Not Taking at Unknown time  . gabapentin (NEURONTIN) 100 MG capsule Take 1 capsule (100 mg total) by mouth 3 (three) times daily. (Patient not taking: Reported on 09/10/2019) 90 capsule 3 Not Taking at Unknown time  . HYDROcodone-acetaminophen (NORCO) 10-325 MG tablet Take 1 tablet by mouth 3 (three) times daily as needed. (Patient not taking: Reported on 02/24/2020) 60 tablet 0  Not Taking at Unknown time  . HYDROcodone-acetaminophen (NORCO) 10-325 MG tablet 1 tid prn pain (Patient not taking: Reported on 02/24/2020) 60 tablet 0 Not Taking at Unknown time  . nitroGLYCERIN (NITROSTAT) 0.4 MG SL tablet Place 1 tablet (0.4 mg total) under the tongue every 5 (five) minutes as needed for chest pain. 30 tablet 3 unknown  . Zoster Vaccine Adjuvanted [facility]) injection Inject times one dose (Patient not taking: Reported on 09/10/2019) 0.5 mL 1 Not Taking at Unknown time    Assessment: Pharmacy consulted to dose heparin in patient with ACS/NSTEMI. No AC PTA.   Underwent cardiac cath with successful SVG-PCI with stenting of proximal and distal body of  SVG-diagonal. S/p cath patient has continued 10/10 chest pain. Pharmacy to start heparin now. Hgb 15.2, plt 133. No s/sx of bleeding.  Goal of Therapy:  Heparin level 0.3-0.7 units/ml Monitor platelets by anticoagulation protocol: Yes   Plan:  Start heparin infusion at 1000 units/hr Check anti-Xa level in 8 hours and daily while on heparin Continue to monitor H&H and platelets  Antonietta Jewel, PharmD, [place] Pharmacist  Phone: [phone]  Please check AMION for all [place] phone numbers After 10:00 PM, call [place] [phone] 02/24/2020,9:30 PM

## 2020-02-24 NOTE — Progress Notes (Signed)
ANTICOAGULATION CONSULT NOTE - Initial Consult  Pharmacy Consult for heparin IV Indication: ACS/chest pain/NSTEMI  Allergies  Allergen Reactions  . Dye Fdc Red [Red Dye]     hives  . Ivp Dye [Iodinated Diagnostic Agents] Hives  . Keflex [Cephalexin]     hoarsness  . Plavix [Clopidogrel] Hives    Unsure if dye or Plavix    Patient Measurements: Height: 6' (182.9 cm) Weight: 190 lb (86.2 kg) IBW/kg (Calculated) : 77.6 Heparin Dosing Weight: 86 kg  Vital Signs: Temp: 98 F (36.7 C) (03/22 0615) Temp Source: Oral (03/22 0615) BP: 157/92 (03/22 0830) Pulse Rate: 63 (03/22 0830)  Labs: Recent Labs    02/24/20 0625  HGB 15.3  HCT 45.7  PLT 149*  CREATININE 1.16  TROPONINIHS 202*    Estimated Creatinine Clearance: 58.5 mL/min (by C-G formula based on SCr of 1.16 mg/dL).   Medical History: Past Medical History:  Diagnosis Date  . AAA (abdominal aortic aneurysm) ([place])   . CAD (coronary artery disease)    a. s/p CABG in 1997 b. stent to SVG-PDA and PTCA of LAD in 2003 c. cath in 2015 showing patent SVG-OM1-OM2, SVG-RI, SVG-D1, and LIMA-LAD with occluded SVG-PDA and severe stenosis of mid-LAD at LIMA insertion with DES placed d. 04/2018: similar results to 2015 with patent LAD stent; occlusion of small caliber D1 stenosis with medical management recommended.   . Chronic back pain 07/25/2018   Chronic lumbar pain uses hydrocodone intermittently  . Chronic systolic CHF (congestive heart failure) ([place])   . COPD (chronic obstructive pulmonary disease) ([place])   . Diabetes mellitus    diet controlled  . Dyslipidemia   . Hypertension   . Myocardial infarction [facility]) 1997   stents x2 (2003/2007)    Medications:  (Not in a hospital admission)   Assessment: Pharmacy consulted to dose heparin in patient with ACS/NSTEMI.  Patient is not on anticoagulation prior to admission. Follow-up troponin trend- possible transfer to [facility] for cath if significant increase.  Goal of  Therapy:  Heparin level 0.3-0.7 units/ml Monitor platelets by anticoagulation protocol: Yes   Plan:  Give 4000 units bolus x 1 Start heparin infusion at 1100 units/hr Check anti-Xa level in 8 hours and daily while on heparin Continue to monitor H&H and platelets  Ramond Craver 02/24/2020,9:07 AM

## 2020-02-24 NOTE — Progress Notes (Signed)
Received pt alert and oriented , skin warm and dry, denies any discomfort at this time.  IVF infusing with NTG and   Heparin .  Consent to be signed and pt was put on  Bedside monitor.

## 2020-02-24 NOTE — ED Provider Notes (Signed)
[facility] Provider Note   CSN: [id number] Arrival date & time: 02/24/20  [id number]     History Chief Complaint  Patient presents with  . Chest Pain    Peter [name] is a 79 y.o. male.  Patient presents via EMS for 2 days history of "indigestion".  He describes burning pain in his chest that radiates to both arms and feels like "pressure behind my lungs".  He states the pain is fairly constant but waxes and wanes in severity but never goes away completely.  He thinks the pain is due to eating sweets.  He has never been diagnosed with indigestion in the past and does not take any acid reflux medications.  Nitroglycerin did help his chest pain that was given by EMS.  Patient does have a history of CAD status post CABG with 3 stents, abdominal aortic aneurysm, CHF, COPD, diabetes and hypertension. He feels the burning in his chest is worse when he eats food and gets better when he "drinks aloe vera juice".  He states he has had this pain on and off in the past but became concerned this morning because it was quite severe.  He denies any radiation of the pain to his back or neck.  He denies any associated shortness of breath, nausea, vomiting, cough or fever.   Reports his pain is now 3 out of 10 after receiving nitroglycerin from EMS.  The history is provided by the patient and the EMS personnel.       Past Medical History:  Diagnosis Date  . AAA (abdominal aortic aneurysm) ([place])   . CAD (coronary artery disease)    a. s/p CABG in 1997 b. stent to SVG-PDA and PTCA of LAD in 2003 c. cath in 2015 showing patent SVG-OM1-OM2, SVG-RI, SVG-D1, and LIMA-LAD with occluded SVG-PDA and severe stenosis of mid-LAD at LIMA insertion with DES placed d. 04/2018: similar results to 2015 with patent LAD stent; occlusion of small caliber D1 stenosis with medical management recommended.   . Chronic back pain 07/25/2018   Chronic lumbar pain uses hydrocodone intermittently  . Chronic  systolic CHF (congestive heart failure) ([place])   . COPD (chronic obstructive pulmonary disease) ([place])   . Diabetes mellitus    diet controlled  . Dyslipidemia   . Hypertension   . Myocardial infarction [facility]) 1997   stents x2 (2003/2007)    Patient Active Problem List   Diagnosis Date Noted  . Chronic back pain 07/25/2018  . CVA (cerebral vascular accident) ([place]) 04/14/2018  . Stroke-like symptoms 04/12/2018  . CAD (coronary artery disease) 04/12/2018  . Elevated troponin 04/12/2018  . Claudication ([place]) 04/12/2018  . 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  PCI- LIMA-LAD insertion site DES 08/14/14 08/15/2014  . COPD (chronic obstructive pulmonary disease) ([place]) 08/15/2014  . 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 Plavix allergy 08/15/2014  . Exertional angina ([place]) 08/14/2014  . HTN (hypertension), benign 02/03/2014  . AAA (abdominal aortic aneurysm) without rupture ([place]) 02/03/2014  . H/O ETOH abuse 04/02/2013  . Bowel habit changes 04/02/2013  . Dyslipidemia 03/13/2013  . Type 2 diabetes mellitus with peripheral neuropathy ([place]) 03/13/2013  . Pain, abdominal, nonspecific 03/13/2013    Past Surgical History:  Procedure Laterality Date  . CORONARY ANGIOPLASTY  2003/2007   2 stents  . CORONARY ANGIOPLASTY WITH STENT PLACEMENT  08/14/14   DES to LIMA-LAD insertion  . CORONARY ARTERY BYPASS GRAFT  1997   7 vessels  . CORONARY  STENT PLACEMENT  08/15/2014   MID LAD  DES  by Dr Burt Knack  . CYSTECTOMY  2005   top of head-[place]  . EAR CYST EXCISION  07/09/2012   Procedure: CYST REMOVAL;  Surgeon: Jamesetta So, MD;  Location: [facility];  Service: General;  Laterality: N/A;  . LEFT HEART CATH AND CORS/GRAFTS ANGIOGRAPHY N/A 04/16/2018   Procedure: LEFT HEART CATH AND CORS/GRAFTS ANGIOGRAPHY;  Surgeon: Martinique, Peter M, MD;  Location: [facility];  Service: Cardiovascular;  Laterality: N/A;  . LEFT HEART CATHETERIZATION WITH CORONARY/GRAFT ANGIOGRAM N/A 08/14/2014   Procedure:  LEFT HEART CATHETERIZATION WITH Beatrix Fetters;  Surgeon: Blane Ohara, MD;  Location: [facility] CATH LAB;  Service: Cardiovascular;  Laterality: N/A;       Family History  Problem Relation Age of Onset  . Other Father        deceased after ?TCS or barium enema, age 70s  . Hypertension Father   . Heart attack Mother   . Colon cancer Neg Hx   . Liver disease Neg Hx     Social History   Tobacco Use  . Smoking status: Former Smoker    Packs/day: 3.00    Years: 40.00    Pack years: 120.00    Types: Cigarettes    Quit date: 07/02/2000    Years since quitting: 19.6  . Smokeless tobacco: Never Used  Substance Use Topics  . Alcohol use: No    Alcohol/week: 10.0 standard drinks    Types: 10 Cans of beer per week    Comment: quit 3 months (01/2013). 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  . Drug use: No    Home Medications Prior to Admission medications   Medication Sig Start Date End Date Taking? Authorizing Provider  Accu-Chek FastClix Lancets MISC USE 1  TO CHECK GLUCOSE TWICE DAILY DUE TO FLUCTUATING SUGARS 07/05/19   Kathyrn Drown, MD  ACCU-CHEK GUIDE test strip USE 1 STRIP TO CHECK GLUCOSE TWICE DAILY DUE  TO  FLUCTUATING  SUGARS 10/11/19   Kathyrn Drown, MD  aspirin EC 81 MG tablet Take 1 tablet (81 mg total) by mouth daily. 09/11/18   Barrett, Evelene Croon, PA-C  gabapentin (NEURONTIN) 100 MG capsule Take 1 capsule (100 mg total) by mouth 3 (three) times daily. Patient not taking: Reported on 09/10/2019 05/07/18   Kathyrn Drown, MD  HYDROcodone-acetaminophen (NORCO) 10-325 MG tablet Take 1 tablet by mouth 3 (three) times daily as needed. 09/10/19   Kathyrn Drown, MD  HYDROcodone-acetaminophen (NORCO) 10-325 MG tablet Take 1 tablet by mouth 3 (three) times daily as needed. 09/10/19   Kathyrn Drown, MD  HYDROcodone-acetaminophen [facility]) 10-325 MG tablet 1 tid prn pain 01/22/20   Kathyrn Drown, MD  metoprolol succinate (TOPROL-XL) 25 MG 24 hr  tablet Take 1 tablet (25 mg total) by mouth daily. 09/10/19   Kathyrn Drown, MD  nitroGLYCERIN (NITROSTAT) 0.4 MG SL tablet Place 1 tablet (0.4 mg total) under the tongue every 5 (five) minutes as needed for chest pain. 09/10/19   Kathyrn Drown, MD  Zoster Vaccine Adjuvanted [facility]) injection Inject times one dose Patient not taking: Reported on 09/10/2019 07/25/18   Kathyrn Drown, MD    Allergies    Dye fdc red [red dye], Ivp dye [iodinated diagnostic agents], Keflex [cephalexin], and Plavix [clopidogrel]  Review of Systems   Review of Systems  Constitutional: Negative for activity change, appetite change, fatigue and  fever.  HENT: Negative for congestion and rhinorrhea.   Respiratory: Positive for chest tightness. Negative for cough and shortness of breath.   Cardiovascular: Negative for chest pain.  Gastrointestinal: Negative for abdominal pain, nausea and vomiting.  Genitourinary: Negative for dysuria and hematuria.  Musculoskeletal: Negative for arthralgias and myalgias.  Skin: Negative for rash.  Neurological: Negative for dizziness, weakness and headaches.    all other systems are negative except as noted in the HPI and PMH.   Physical Exam Updated Vital Signs BP (!) 181/103   Pulse 74   Temp 98 F (36.7 C) (Oral)   Resp 20   Ht 6' (1.829 m)   Wt 86.2 kg   SpO2 92%   BMI 25.77 kg/m   Physical Exam Vitals and nursing note reviewed.  Constitutional:      General: He is not in acute distress.    Appearance: He is well-developed.  HENT:     Head: Normocephalic and atraumatic.     Mouth/Throat:     Pharynx: No oropharyngeal exudate.  Eyes:     Conjunctiva/sclera: Conjunctivae normal.     Pupils: Pupils are equal, round, and reactive to light.  Neck:     Comments: No meningismus. Cardiovascular:     Rate and Rhythm: Normal rate and regular rhythm.     Pulses: Normal pulses.     Heart sounds: Normal heart sounds. No murmur.     Comments: Equal radial, DP,  femoral pulses Pulmonary:     Effort: Pulmonary effort is normal. No respiratory distress.     Breath sounds: Normal breath sounds.     Comments: Sternal scar Chest:     Chest wall: No tenderness.  Abdominal:     Palpations: Abdomen is soft.     Tenderness: There is no abdominal tenderness. There is no guarding or rebound.  Musculoskeletal:        General: No tenderness. Normal range of motion.     Cervical back: Normal range of motion and neck supple.  Skin:    General: Skin is warm.     Capillary Refill: Capillary refill takes less than 2 seconds.  Neurological:     General: No focal deficit present.     Mental Status: He is alert and oriented to person, place, and time. Mental status is at baseline.     Cranial Nerves: No cranial nerve deficit.     Motor: No abnormal muscle tone.     Coordination: Coordination normal.     Comments:  5/5 strength throughout. CN 2-12 intact.Equal grip strength.   Psychiatric:        Behavior: Behavior normal.     ED Results / Procedures / Treatments   Labs (all labs ordered are listed, but only abnormal results are displayed) Labs Reviewed  CBC WITH DIFFERENTIAL/PLATELET - Abnormal; Notable for the following components:      Result Value   Platelets 149 (*)    All other components within normal limits  COMPREHENSIVE METABOLIC PANEL - Abnormal; Notable for the following components:   Glucose, Bld 178 (*)    All other components within normal limits  TROPONIN I (HIGH SENSITIVITY) - Abnormal; Notable for the following components:   Troponin I (High Sensitivity) 202 (*)    All other components within normal limits  SARS CORONAVIRUS 2 (TAT 6-24 HRS)  LIPASE, BLOOD  BRAIN NATRIURETIC PEPTIDE    EKG EKG Interpretation  Date/Time:  Monday February 24 2020 06:16:57 EDT Ventricular Rate:  68 PR  Interval:    QRS Duration: 112 QT Interval:  438 QTC Calculation: 466 R Axis:   -34 Text Interpretation: Sinus rhythm LVH with IVCD and secondary  repol abnrm Baseline wander in lead(s) I III aVL V1 no STEMI Confirmed by Ezequiel Essex [phone]) on 02/24/2020 6:31:20 AM   Radiology DG Chest Portable 1 View  Result Date: 02/24/2020 CLINICAL DATA:  Chest pain and pressure EXAM: PORTABLE CHEST 1 VIEW COMPARISON:  04/12/2018 FINDINGS: Normal heart size when accounting for mediastinal fat. Mild aortic tortuosity. There has been CABG. Mild interstitial coarsening without visible effusion or pneumothorax. IMPRESSION: Symmetric interstitial prominence, likely mild edema. Electronically Signed   By: Monte Fantasia M.D.   On: 02/24/2020 06:44    Procedures .Critical Care Performed by: Ezequiel Essex, MD Authorized by: Ezequiel Essex, MD   Critical care provider statement:    Critical care time (minutes):  35   Critical care was necessary to treat or prevent imminent or life-threatening deterioration of the following conditions: NSTEMI, unstable angina.   Critical care was time spent personally by me on the following activities:  Discussions with consultants, evaluation of patient's response to treatment, examination of patient, ordering and performing treatments and interventions, ordering and review of laboratory studies, ordering and review of radiographic studies, pulse oximetry, re-evaluation of patient's condition, obtaining history from patient or surrogate and review of old charts   (including critical care time)  Medications Ordered in ED Medications - No data to display  ED Course  I have reviewed the triage vital signs and the nursing notes.  Pertinent labs & imaging results that were available during my care of the patient were reviewed by me and considered in my medical decision making (see chart for details).    MDM Rules/Calculators/A&P                      Patient with history of CAD status post CABG here with burning "indigestion" chest pain for the past 2 days.  Pain radiates to both arms.  No associated shortness of  breath, nausea, vomiting or diaphoresis.  EKG shows LVH without acute ischemia.  Patient given aspirin and nitroglycerin. Heart score is 6.  Patient does have known 4.7 cm abdominal aneurysm.  He denies any abdominal pain or back pain. Description of symptoms is atypical for aortic dissection.  Patient does have IV dye contrast allergy but has received contrast in the past with pretreatment.  - 04/2018 cathshowed severe 3-vessel CAD with patent LIMA-LAD, patent stent along mid-LAD, patent SVG-OM1-OM2, and patent SVG-RI with occlusion of D1 and known occlusion of SVG-PDA. Thediagonal occlusion was thought tobe old, therefore continued medical therapy was recommended. - 04/2018 echo LVEF 30-35%.  Patient reports burning pain is 2-3 out of 10.  He did take full dose aspirin at home. Troponin elevated at 202. We will give IV morphine as well as start nitroglycerin drip.  Remains hypertensive.  Patient will need admission for NSTEMI/unstable angina/hypertensive emergency.  Chest pain improving on IV nitroglycerin.  Cardiology not yet available until 9 AM.  Admission discussed with Dr. Denton Brick of hospitalist service will speak with cardiology.  Do not anticipate patient will need transfer to [facility]. Dr. Denton Brick agrees to hold heparin at this time while trending troponin.   Final Clinical Impression(s) / ED Diagnoses Final diagnoses:  Unstable angina (HCC)  NSTEMI (non-ST elevated myocardial infarction) ([place])    Rx / DC Orders ED Discharge Orders    None  Ezequiel Essex, MD 02/24/20 [phone]

## 2020-02-24 NOTE — H&P (Signed)
Patient ID: Peter [name]  MRN: [id number]; DOB: [birth year]  Admit date: 02/24/2020  Date of Consult: 02/24/2020  Primary Care Provider: Kathyrn Drown, MD  Primary Cardiologist: Peter Dolly, MD  Primary Electrophysiologist: None  Patient Profile:  Peter [name] is a 79 y.o. male with a hx of CAD who is being seen today for the evaluation of chest pain at the request of Dr .Peter [name]  History of Present Illness:  Peter [name] 79 yo male history of extensive history of CAD with CABG in 1997, stent to SVG-PDA and PTCA of the LAD in 05/2002. 2008 heart cath [facility] had repeat stenting, he is unsure of the details. Cath 2015 showing patent SVG-OM1-OM2, SVG-RI, SVG-D1, and LIMA-LAD with occluded SVG-PDA and severe stenosis of mid-LAD at LIMA insertion with DES placed. 04/2018 cath showed severe 3-vessel CAD with patent LIMA-LAD, patent stent along mid-LAD, patent SVG-OM1-OM2, and patent SVG-RI with occlusion of D1 and known occlusion of SVG-PDA. The diagonal occlusion was thought to be old, therefore continued medical therapy was recommended.  History of chronic systolic HF , HTN, HL, COPD. AAA followed by vascular  Medical therapy has been limited by his self termination of medications.  Presents with chest pain. Reports for last several months intermittent chest pressure, he usually he attributed it to eating sweets when the symptoms would come on. Would get better with drinking aloe vera juice at home. On Saturday pain started after eating some donuts. More severe and longer lasting than prior. Would last up to 2-3 hours. No associated SOB. 10/10 pressure midchest, can radiate down both arms, not positional. Ongoign symptoms Sunday and Monday not related to food. This morning around 4AM woke up from sleep with pain that would not go away. Called EMS, given NG with relief. In ER started on NG drip with relief as well.  ER vitals: 181/103 p 74 92% RA  WBC 10/2 Hgb 15.3 Plt 149 K 4.1 Cr 1.16 Lipase  23 BNP 220  hstrop 202-->  EKG SR, LVH, chronic ST/T changes  CXR probable pulm edema  04/2018 echo: LVEF 30-35%, grade II diastolic dysfunction, Akinesis of the mid-apical  anterior, basal-mid anteroseptal, mid inferoseptal, apical  septal, and apical myocardium.      Past Medical History:  Diagnosis Date  . AAA (abdominal aortic aneurysm) ([place])   . CAD (coronary artery disease)    a. s/p CABG in 1997 b. stent to SVG-PDA and PTCA of LAD in 2003 c. cath in 2015 showing patent SVG-OM1-OM2, SVG-RI, SVG-D1, and LIMA-LAD with occluded SVG-PDA and severe stenosis of mid-LAD at LIMA insertion with DES placed d. 04/2018: similar results to 2015 with patent LAD stent; occlusion of small caliber D1 stenosis with medical management recommended.   . Chronic back pain 07/25/2018   Chronic lumbar pain uses hydrocodone intermittently  . Chronic systolic CHF (congestive heart failure) ([place])   . COPD (chronic obstructive pulmonary disease) ([place])   . Diabetes mellitus    diet controlled  . Dyslipidemia   . Hypertension   . Myocardial infarction [facility]) 1997   stents x2 (2003/2007)        Past Surgical History:  Procedure Laterality Date  . CORONARY ANGIOPLASTY  2003/2007   2 stents  . CORONARY ANGIOPLASTY WITH STENT PLACEMENT  08/14/14   DES to LIMA-LAD insertion  . CORONARY ARTERY BYPASS GRAFT  1997   7 vessels  . CORONARY STENT PLACEMENT  08/15/2014   MID LAD DES by Dr Burt Knack  . CYSTECTOMY  2005   top of head-Jenkins  . EAR CYST EXCISION  07/09/2012   Procedure: CYST REMOVAL; Surgeon: Jamesetta So, MD; Location: [facility]; Service: General; Laterality: N/A;  . LEFT HEART CATH AND CORS/GRAFTS ANGIOGRAPHY N/A 04/16/2018   Procedure: LEFT HEART CATH AND CORS/GRAFTS ANGIOGRAPHY; Surgeon: Martinique, Peter M, MD; Location: [place] CV LAB; Service: Cardiovascular; Laterality: N/A;  . LEFT HEART CATHETERIZATION WITH CORONARY/GRAFT ANGIOGRAM N/A 08/14/2014   Procedure: LEFT HEART CATHETERIZATION WITH  Beatrix Fetters; Surgeon: Blane Ohara, MD; Location: [facility] CATH LAB; Service: Cardiovascular; Laterality: N/A;   Inpatient Medications:  Scheduled Meds:   Continuous Infusions:  . nitroGLYCERIN 5 mcg/min (02/24/20 0751)   PRN Meds:  Allergies:       Allergies  Allergen Reactions  . Dye Fdc Red [Red Dye]     hives  . Ivp Dye [Iodinated Diagnostic Agents] Hives  . Keflex [Cephalexin]     hoarsness  . Plavix [Clopidogrel] Hives    Unsure if dye or Plavix   Social History:  Social History        Socioeconomic History  . Marital status: Married    Spouse name: Not on file  . Number of children: Not on file  . Years of education: Not on file  . Highest education level: Not on file  Occupational History  . Not on file  Tobacco Use  . Smoking status: Former Smoker    Packs/day: 3.00    Years: 40.00    Pack years: 120.00    Types: Cigarettes    Quit date: 07/02/2000    Years since quitting: 19.6  . Smokeless tobacco: Never Used  Substance and Sexual Activity  . Alcohol use: No    Alcohol/week: 10.0 standard drinks    Types: 10 Cans of beer per week    Comment: quit 3 months (01/2013). 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  . Drug use: No  . Sexual activity: Never    Birth control/protection: None  Other Topics Concern  . Not on file  Social History Narrative  . Not on file   Social Determinants of Health      Financial Resource Strain:   . Difficulty of Paying Living Expenses:   Food Insecurity:   . Worried About Charity fundraiser in the Last Year:   . Arboriculturist in the Last Year:   Transportation Needs:   . Film/video editor (Medical):   Marland Kitchen Lack of Transportation (Non-Medical):   Physical Activity:   . Days of Exercise per Week:   . Minutes of Exercise per Session:   Stress:   . Feeling of Stress :   Social Connections:   . Frequency of Communication with Friends and Family:   . Frequency of Social  Gatherings with Friends and Family:   . Attends Religious Services:   . Active Member of Clubs or Organizations:   . Attends Archivist Meetings:   Marland Kitchen Marital Status:   Intimate Partner Violence:   . Fear of Current or Ex-Partner:   . Emotionally Abused:   Marland Kitchen Physically Abused:   . Sexually Abused:    Family History:       Family History  Problem Relation Age of Onset  . Other Father    deceased after ?TCS or barium enema, age 5s  . Hypertension Father   . Heart attack Mother   . Colon cancer Neg Hx   . Liver disease Neg Hx  ROS:  Please see the history of present illness.  All other ROS reviewed and negative.  Physical Exam/Data:         Vitals:   02/24/20 0700 02/24/20 0730 02/24/20 0800 02/24/20 0830  BP: (!) 177/90 (!) 179/93 (!) 189/94 (!) 157/92  Pulse: 63 65 68 63  Resp:  14 10 13   Temp:      TempSrc:      SpO2: 96% 98% 97% 97%  Weight:      Height:        Intake/Output Summary (Last 24 hours) at 02/24/2020 0858  Last data filed at 02/24/2020 0752     Gross per 24 hour  Intake --  Output 550 ml  Net -550 ml   Last 3 Weights 02/24/2020 10/25/2019 08/23/2019  Weight (lbs) 190 lb 188 lb 192 lb  Weight (kg) 86.183 kg 85.276 kg 87.091 kg   Body mass index is 25.77 kg/[name].  General: Well nourished, well developed, in no acute distress  HEENT: normal  Lymph: no adenopathy  Neck: no JVD  Endocrine: No thryomegaly  Vascular: No carotid bruits; FA pulses 2+ bilaterally without bruits  Cardiac: normal S1, S2; RRR; no murmur  Lungs: clear to auscultation bilaterally, no wheezing, rhonchi or rales  Abd: soft, nontender, no hepatomegaly  Ext: no edema  Musculoskeletal: No deformities, BUE and BLE strength normal and equal  Skin: warm and dry  Neuro: CNs 2-12 intact, no focal abnormalities noted  Psych: Normal affect  Laboratory Data:  High Sensitivity Troponin:  Last Labs                  Chemistry  Last Labs                                                            Last Labs                                      Hematology  Last Labs                                                  BNP  Last Labs                  DDimer  Last Labs     Radiology/Studies:  DG Chest Portable 1 View  Result Date: 02/24/2020  CLINICAL DATA: Chest pain and pressure EXAM: PORTABLE CHEST 1 VIEW COMPARISON: 04/12/2018 FINDINGS: Normal heart size when accounting for mediastinal fat. Mild aortic tortuosity. There has been CABG. Mild interstitial coarsening without visible effusion or pneumothorax. IMPRESSION: Symmetric interstitial prominence, likely mild edema. Electronically Signed By: Monte Fantasia [name].D. On: 02/24/2020 06:44   HEAR Score (for undifferentiated chest pain):  Assessment and Plan:  1. CAD/Chest pain  - extensive history as reported above with prior CABG and multiple interventions. Medical therapy has been limited due to patient refusal for some meds.  - admitted just this morning at 6AM, ongoing workup for ACS  - chest pain with some fairly atypical features though did have significant relief with NG,  intial trop is 202  - EKG SR with LVH and chronic ST/T changes. He did present hypertensive, some signs of fluid overload.  - in ER started on NG drip, given morphine 2mg  x1, given GI cocktail  - currently pain free  - f/u second troponin, if significant uptrend would plan for transfer to [facility] for cath and management of NSTEMI. F/u echo  - medical therapy with ASA 81, toprol 25. He agrees to restart statin, we will start atorva 80mg . Stat hep gtt. Will also start losartan 25mg  daily.  2. Chronic systolic HF  - medical therapy has been limited due to patient refusal for some meds.  - dizziness on entresto, he stopped taking. He refused low dose ARB previously, will start at this time as he is open to additional meds currently  - appears euvolemic by exam.  3. Hyperlipidemia  - he previously  stopped his statin on his own, at last visit did not want to restart  Addendum 940AM second trop up to 742, we will plan for transfer to [facility] for left heart cath in setting of NSTEMI, telemetetry bed and cardiology service.  For questions or updates, please contact [place]  Please consult [email or web address] for contact info under  Signed,  Peter Dolly, MD  02/24/2020 8:58 AM   Patient ID: Peter [name] MRN: [id number]; DOB: Jan 21, 1941   Admit date: 02/24/2020 Date of Consult: 02/24/2020   Primary Care Provider: Kathyrn Drown, MD Primary Cardiologist: Peter Dolly, MD  Primary Electrophysiologist:  None      Patient Profile:    Peter [name] is a 79 y.o. male with a hx of CAD who is being seen today for the evaluation of chest pain at the request of Dr .Peter [name]   History of Present Illness:    Mr. Mensah 79 yo male history of extensive history of CAD with CABG in 1997, stent to SVG-PDA and PTCA of the LAD in 05/2002. 2008 heart cath [facility] had repeat stenting, he is unsure of the details. Cath 2015 showing patent SVG-OM1-OM2, SVG-RI, SVG-D1, and LIMA-LAD with occluded SVG-PDA and severe stenosis of mid-LAD at LIMA insertion with DES placed. 04/2018 cath showed severe 3-vessel CAD with patent LIMA-LAD, patent stent along mid-LAD, patent SVG-OM1-OM2, and patent SVG-RI with occlusion of D1 and known occlusion of SVG-PDA. The diagonal occlusion was thought to be old, therefore continued medical therapy was recommended.    History of chronic systolic HF , HTN, HL, COPD. AAA followed by vascular   Medical therapy has been limited by his self termination of medications.    Presents with chest pain. Reports for last several months intermittent chest pressure, he usually he attributed it to eating sweets when the symptoms would come on. Would get better with drinking aloe vera juice at home. On Saturday pain started after eating some donuts. More severe and longer  lasting than prior. Would last up to 2-3 hours. No associated SOB. 10/10 pressure midchest, can radiate down both arms, not positional. Ongoign symptoms Sunday and Monday not related to food. This morning around 4AM woke up from sleep with pain that would not go away. Called EMS, given NG with relief. In ER started on NG drip with relief as well.        ER vitals: 181/103 p 74 92% RA  WBC 10/2 Hgb 15.3 Plt 149 K 4.1 Cr 1.16 Lipase 23 BNP 220 hstrop 202--> EKG SR, LVH, chronic ST/T changes CXR probable pulm edema 04/2018  echo: LVEF 30-35%, grade II diastolic dysfunction, Akinesis of the mid-apical    anterior, basal-mid anteroseptal, mid inferoseptal, apical    septal, and apical myocardium.      Past Medical History:  Diagnosis Date  . AAA (abdominal aortic aneurysm) ([place])    . CAD (coronary artery disease)      a. s/p CABG in 1997 b. stent to SVG-PDA and PTCA of LAD in 2003 c. cath in 2015 showing patent SVG-OM1-OM2, SVG-RI, SVG-D1, and LIMA-LAD with occluded SVG-PDA and severe stenosis of mid-LAD at LIMA insertion with DES placed d. 04/2018: similar results to 2015 with patent LAD stent; occlusion of small caliber D1 stenosis with medical management recommended.   . Chronic back pain 07/25/2018    Chronic lumbar pain uses hydrocodone intermittently  . Chronic systolic CHF (congestive heart failure) ([place])    . COPD (chronic obstructive pulmonary disease) ([place])    . Diabetes mellitus      diet controlled  . Dyslipidemia    . Hypertension    . Myocardial infarction [facility]) 1997    stents x2 (2003/2007)           Past Surgical History:  Procedure Laterality Date  . CORONARY ANGIOPLASTY   2003/2007    2 stents  . CORONARY ANGIOPLASTY WITH STENT PLACEMENT   08/14/14    DES to LIMA-LAD insertion  . CORONARY ARTERY BYPASS GRAFT   1997    7 vessels  . CORONARY STENT PLACEMENT   08/15/2014    MID LAD  DES  by Dr Burt Knack  . CYSTECTOMY   2005    top of head-Jenkins  . EAR CYST EXCISION    07/09/2012    Procedure: CYST REMOVAL;  Surgeon: Jamesetta So, MD;  Location: [facility];  Service: General;  Laterality: N/A;  . LEFT HEART CATH AND CORS/GRAFTS ANGIOGRAPHY N/A 04/16/2018    Procedure: LEFT HEART CATH AND CORS/GRAFTS ANGIOGRAPHY;  Surgeon: Martinique, Peter M, MD;  Location: [place] CV LAB;  Service: Cardiovascular;  Laterality: N/A;  . LEFT HEART CATHETERIZATION WITH CORONARY/GRAFT ANGIOGRAM N/A 08/14/2014    Procedure: LEFT HEART CATHETERIZATION WITH Beatrix Fetters;  Surgeon: Blane Ohara, MD;  Location: [facility] CATH LAB;  Service: Cardiovascular;  Laterality: N/A;      Inpatient Medications: Scheduled Meds: Continuous Infusions: . nitroGLYCERIN 5 mcg/min (02/24/20 0751)    PRN Meds:     Allergies:         Allergies  Allergen Reactions  . Dye Fdc Red [Red Dye]        hives  . Ivp Dye [Iodinated Diagnostic Agents] Hives  . Keflex [Cephalexin]        hoarsness  . Plavix [Clopidogrel] Hives      Unsure if dye or Plavix      Social History:   Social History         Socioeconomic History  . Marital status: Married      Spouse name: Not on file  . Number of children: Not on file  . Years of education: Not on file  . Highest education level: Not on file  Occupational History  . Not on file  Tobacco Use  . Smoking status: Former Smoker      Packs/day: 3.00      Years: 40.00      Pack years: 120.00      Types: Cigarettes      Quit date: 07/02/2000      Years since quitting: 19.6  . Smokeless  tobacco: Never Used  Substance and Sexual Activity  . Alcohol use: No      Alcohol/week: 10.0 standard drinks      Types: 10 Cans of beer per week      Comment: quit 3 months (01/2013). 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  . Drug use: No  . Sexual activity: Never      Birth control/protection: None  Other Topics Concern  . Not on file  Social History Narrative  . Not on file    Social Determinants of Health       Financial  Resource Strain:   . Difficulty of Paying Living Expenses:   Food Insecurity:   . Worried About Charity fundraiser in the Last Year:   . Arboriculturist in the Last Year:   Transportation Needs:   . Film/video editor (Medical):   Marland Kitchen Lack of Transportation (Non-Medical):   Physical Activity:   . Days of Exercise per Week:   . Minutes of Exercise per Session:   Stress:   . Feeling of Stress :   Social Connections:   . Frequency of Communication with Friends and Family:   . Frequency of Social Gatherings with Friends and Family:   . Attends Religious Services:   . Active Member of Clubs or Organizations:   . Attends Archivist Meetings:   Marland Kitchen Marital Status:   Intimate Partner Violence:   . Fear of Current or Ex-Partner:   . Emotionally Abused:   Marland Kitchen Physically Abused:   . Sexually Abused:     Family History:          Family History  Problem Relation Age of Onset  . Other Father          deceased after ?TCS or barium enema, age 30s  . Hypertension Father    . Heart attack Mother    . Colon cancer Neg Hx    . Liver disease Neg Hx        ROS:  Please see the history of present illness.  All other ROS reviewed and negative.      Physical Exam/Data:          Vitals:    02/24/20 0700 02/24/20 0730 02/24/20 0800 02/24/20 0830  BP: (!) 177/90 (!) 179/93 (!) 189/94 (!) 157/92  Pulse: 63 65 68 63  Resp:   14 10 13   Temp:          TempSrc:          SpO2: 96% 98% 97% 97%  Weight:          Height:              Intake/Output Summary (Last 24 hours) at 02/24/2020 0858 Last data filed at 02/24/2020 0752    Gross per 24 hour  Intake --  Output 550 ml  Net -550 ml    Last 3 Weights 02/24/2020 10/25/2019 08/23/2019  Weight (lbs) 190 lb 188 lb 192 lb  Weight (kg) 86.183 kg 85.276 kg 87.091 kg     Body mass index is 25.77 kg/[name].  General:  Well nourished, well developed, in no acute distress HEENT: normal Lymph: no adenopathy Neck: no JVD Endocrine:  No  thryomegaly Vascular: No carotid bruits; FA pulses 2+ bilaterally without bruits  Cardiac:  normal S1, S2; RRR; no murmur  Lungs:  clear to auscultation bilaterally, no wheezing, rhonchi or rales  Abd: soft, nontender, no hepatomegaly  Ext: no edema Musculoskeletal:  No deformities, BUE and BLE strength normal and equal Skin: warm and dry  Neuro:  CNs 2-12 intact, no focal abnormalities noted Psych:  Normal affect      Laboratory Data:   High Sensitivity Troponin:   Last Labs      Recent Labs  Lab 02/24/20 0625  TROPONINIHS 202*       Chemistry Last Labs      Recent Labs  Lab 02/24/20 0625  NA 135  K 4.1  CL 98  CO2 25  GLUCOSE 178*  BUN 19  CREATININE 1.16  CALCIUM 9.6  GFRNONAA >60  GFRAA >60  ANIONGAP 12      Last Labs      Recent Labs  Lab 02/24/20 0625  PROT 7.7  ALBUMIN 4.0  AST 29  ALT 31  ALKPHOS 75  BILITOT 1.1      Hematology Last Labs      Recent Labs  Lab 02/24/20 0625  WBC 10.2  RBC 4.75  HGB 15.3  HCT 45.7  MCV 96.2  MCH 32.2  MCHC 33.5  RDW 13.1  PLT 149*      BNP Last Labs      Recent Labs  Lab 02/24/20 0625  BNP 220.0*      DDimer  Last Labs   No results for input(s): DDIMER in the last 168 hours.       Radiology/Studies:  DG Chest Portable 1 View   Result Date: 02/24/2020 CLINICAL DATA:  Chest pain and pressure EXAM: PORTABLE CHEST 1 VIEW COMPARISON:  04/12/2018 FINDINGS: Normal heart size when accounting for mediastinal fat. Mild aortic tortuosity. There has been CABG. Mild interstitial coarsening without visible effusion or pneumothorax. IMPRESSION: Symmetric interstitial prominence, likely mild edema. Electronically Signed   By: Monte Fantasia [name].D.   On: 02/24/2020 06:44         HEAR Score (for undifferentiated chest pain):        Assessment and Plan:    1. CAD/Chest pain - extensive history as reported above with prior CABG and multiple interventions. Medical therapy has been limited due to  patient refusal for some meds. - admitted just this morning at 6AM, ongoing workup for ACS - chest pain with some fairly atypical features though did have significant relief with NG, intial trop is 202 -  EKG SR with LVH and chronic ST/T changes. He did present hypertensive, some signs of fluid overload.    - in ER started on NG drip, given morphine 2mg  x1, given GI cocktail - currently pain free - f/u second troponin, if significant uptrend would plan for transfer to [facility] for cath and management of NSTEMI. F/u echo - medical therapy with ASA 81, toprol 25. He agrees to restart statin, we will start atorva 80mg . Stat hep gtt. Will also start losartan 25mg  daily.       2. Chronic systolic HF - medical therapy has been limited due to patient refusal for some meds. - dizziness on entresto, he stopped taking. He refused low dose ARB previously, will start at this time as he is open to additional meds currently - appears euvolemic by exam.      3. Hyperlipidemia - he previously stopped his statin on his own, at last visit did not want to restart       Addendum 940AM second trop up to 742, we will plan for transfer to [facility] for left heart cath in setting of NSTEMI, telemetetry  bed and cardiology service.        For questions or updates, please contact [place] Please consult [email or web address] for contact info under        Signed, Peter Dolly, MD  02/24/2020 8:58 AM

## 2020-02-24 NOTE — ED Notes (Signed)
Dr Branch at bedside. ?

## 2020-02-24 NOTE — ED Notes (Signed)
Per [place] at [place], pt going to cath lab and accepted by Dr. Gwenlyn Found

## 2020-02-24 NOTE — ED Notes (Signed)
Unable to reach RN in cath lab.  Part of report given. Prep meds given at this time.

## 2020-02-24 NOTE — ED Notes (Signed)
Paged Dr. Rosalva Ferron to talk to Pts Son.

## 2020-02-24 NOTE — Progress Notes (Signed)
Brief Note:  Patient with complaints of substernal chest pain (10/10). Earlier he underwent PCI of a vein graft. Called to bedside by RN at 8:14 pm.  VS: 146/83 mmHg, HR 77 bpm Cardiovascular exam: normal S1 and S2, no murmurs Lungs clear to auscultation  ECG (at 20:40) revealed NSR (rate 82 bpm), incomplete LBBB, LVH, mild ST changes in I and aVL (similar to tracing at 16:29)  **Discussed case with Dr. Burt Knack and reviewed patient's ECGs**  PLAN: -Titrate IV nitroglycerin to control chest pain -PRN IV morphine -Start unfractionated heparin IV   Melina Schools, MD, [facility]

## 2020-02-24 NOTE — ED Notes (Signed)
Date and time results received: 02/24/2020  (use smartphrase ".now" to insert current time) 0700   Test: Troponin Critical Value: 202  Name of Provider Notified: Dr Wyvonnia Dusky  Orders Received? Or Actions Taken?: see new orders.

## 2020-02-24 NOTE — H&P (View-Only) (Signed)
Cardiology Consultation:   Patient ID: Peter [name] MRN: [id number]; DOB: [birth year]  Admit date: 02/24/2020 Date of Consult: 02/24/2020  Primary Care Provider: Kathyrn Drown, MD Primary Cardiologist: Carlyle Dolly, MD  Primary Electrophysiologist:  None    Patient Profile:   Peter [name] is a 79 y.o. male with a hx of CAD who is being seen today for the evaluation of chest pain at the request of Dr .Denton Brick  History of Present Illness:   [name] 79 yo male history of extensive history of CAD with CABG in 1997, stent to SVG-PDA and PTCA of the LAD in 05/2002. 2008 heart cath [facility] had repeat stenting, he is unsure of the details. Cath 2015showing patent SVG-OM1-OM2, SVG-RI, SVG-D1, and LIMA-LAD with occluded SVG-PDA and severe stenosis of mid-LAD at LIMA insertion with DES placed. 04/2018 cathshowed severe 3-vessel CAD with patent LIMA-LAD, patent stent along mid-LAD, patent SVG-OM1-OM2, and patent SVG-RI with occlusion of D1 and known occlusion of SVG-PDA. Thediagonal occlusion was thought tobe old, therefore continued medical therapy was recommended.   History of chronic systolic HF , HTN, HL, COPD. AAA followed by vascular  Medical therapy has been limited by his self termination of medications.   Presents with chest pain. Reports for last several months intermittent chest pressure, he usually he attributed it to eating sweets when the symptoms would come on. Would get better with drinking aloe vera juice at home. On Saturday pain started after eating some donuts. More severe and longer lasting than prior. Would last up to 2-3 hours. No associated SOB. 10/10 pressure midchest, can radiate down both arms, not positional. Ongoign symptoms Sunday and Monday not related to food. This morning around 4AM woke up from sleep with pain that would not go away. Called EMS, given NG with relief. In ER started on NG drip with relief as well.     ER vitals: 181/103 p 74 92%  RA  WBC 10/2 Hgb 15.3 Plt 149 K 4.1 Cr 1.16 Lipase 23 BNP 220 hstrop 202--> EKG SR, LVH, chronic ST/T changes CXR probable pulm edema 04/2018 echo: LVEF 30-35%, grade II diastolic dysfunction, Akinesis of the mid-apical  anterior, basal-mid anteroseptal, mid inferoseptal, apical  septal, and apical myocardium.  Past Medical History:  Diagnosis Date  . AAA (abdominal aortic aneurysm) ([place])   . CAD (coronary artery disease)    a. s/p CABG in 1997 b. stent to SVG-PDA and PTCA of LAD in 2003 c. cath in 2015 showing patent SVG-OM1-OM2, SVG-RI, SVG-D1, and LIMA-LAD with occluded SVG-PDA and severe stenosis of mid-LAD at LIMA insertion with DES placed d. 04/2018: similar results to 2015 with patent LAD stent; occlusion of small caliber D1 stenosis with medical management recommended.   . Chronic back pain 07/25/2018   Chronic lumbar pain uses hydrocodone intermittently  . Chronic systolic CHF (congestive heart failure) ([place])   . COPD (chronic obstructive pulmonary disease) ([place])   . Diabetes mellitus    diet controlled  . Dyslipidemia   . Hypertension   . Myocardial infarction [facility]) 1997   stents x2 (2003/2007)    Past Surgical History:  Procedure Laterality Date  . CORONARY ANGIOPLASTY  2003/2007   2 stents  . CORONARY ANGIOPLASTY WITH STENT PLACEMENT  08/14/14   DES to LIMA-LAD insertion  . CORONARY ARTERY BYPASS GRAFT  1997   7 vessels  . CORONARY STENT PLACEMENT  08/15/2014   MID LAD  DES  by Dr Burt Knack  . CYSTECTOMY  2005  top of head-[place]  . EAR CYST EXCISION  07/09/2012   Procedure: CYST REMOVAL;  Surgeon: Jamesetta So, MD;  Location: [facility];  Service: General;  Laterality: N/A;  . LEFT HEART CATH AND CORS/GRAFTS ANGIOGRAPHY N/A 04/16/2018   Procedure: LEFT HEART CATH AND CORS/GRAFTS ANGIOGRAPHY;  Surgeon: Martinique, Peter M, MD;  Location: [facility] CV LAB;  Service: Cardiovascular;  Laterality: N/A;  . LEFT HEART CATHETERIZATION WITH CORONARY/GRAFT ANGIOGRAM N/A 08/14/2014    Procedure: LEFT HEART CATHETERIZATION WITH Beatrix Fetters;  Surgeon: Blane Ohara, MD;  Location: [facility] CATH LAB;  Service: Cardiovascular;  Laterality: N/A;     Inpatient Medications: Scheduled Meds:  Continuous Infusions: . nitroGLYCERIN 5 mcg/min (02/24/20 0751)   PRN Meds:   Allergies:    Allergies  Allergen Reactions  . Dye Fdc Red [Red Dye]     hives  . Ivp Dye [Iodinated Diagnostic Agents] Hives  . Keflex [Cephalexin]     hoarsness  . Plavix [Clopidogrel] Hives    Unsure if dye or Plavix    Social History:   Social History   Socioeconomic History  . Marital status: Married    Spouse name: Not on file  . Number of children: Not on file  . Years of education: Not on file  . Highest education level: Not on file  Occupational History  . Not on file  Tobacco Use  . Smoking status: Former Smoker    Packs/day: 3.00    Years: 40.00    Pack years: 120.00    Types: Cigarettes    Quit date: 07/02/2000    Years since quitting: 19.6  . Smokeless tobacco: Never Used  Substance and Sexual Activity  . Alcohol use: No    Alcohol/week: 10.0 standard drinks    Types: 10 Cans of beer per week    Comment: quit 3 months (01/2013). 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  . Drug use: No  . Sexual activity: Never    Birth control/protection: None  Other Topics Concern  . Not on file  Social History Narrative  . Not on file   Social Determinants of Health   Financial Resource Strain:   . Difficulty of Paying Living Expenses:   Food Insecurity:   . Worried About Charity fundraiser in the Last Year:   . Arboriculturist in the Last Year:   Transportation Needs:   . Film/video editor (Medical):   Marland Kitchen Lack of Transportation (Non-Medical):   Physical Activity:   . Days of Exercise per Week:   . Minutes of Exercise per Session:   Stress:   . Feeling of Stress :   Social Connections:   . Frequency of Communication with Friends and  Family:   . Frequency of Social Gatherings with Friends and Family:   . Attends Religious Services:   . Active Member of Clubs or Organizations:   . Attends Archivist Meetings:   Marland Kitchen Marital Status:   Intimate Partner Violence:   . Fear of Current or Ex-Partner:   . Emotionally Abused:   Marland Kitchen Physically Abused:   . Sexually Abused:     Family History:    Family History  Problem Relation Age of Onset  . Other Father        deceased after ?TCS or barium enema, age 73s  . Hypertension Father   . Heart attack Mother   . Colon cancer Neg Hx   . Liver disease  Neg Hx      ROS:  Please see the history of present illness.  All other ROS reviewed and negative.     Physical Exam/Data:   Vitals:   02/24/20 0700 02/24/20 0730 02/24/20 0800 02/24/20 0830  BP: (!) 177/90 (!) 179/93 (!) 189/94 (!) 157/92  Pulse: 63 65 68 63  Resp:  14 10 13   Temp:      TempSrc:      SpO2: 96% 98% 97% 97%  Weight:      Height:        Intake/Output Summary (Last 24 hours) at 02/24/2020 0858 Last data filed at 02/24/2020 0752 Gross per 24 hour  Intake --  Output 550 ml  Net -550 ml   Last 3 Weights 02/24/2020 10/25/2019 08/23/2019  Weight (lbs) 190 lb 188 lb 192 lb  Weight (kg) 86.183 kg 85.276 kg 87.091 kg     Body mass index is 25.77 kg/m.  General:  Well nourished, well developed, in no acute distress HEENT: normal Lymph: no adenopathy Neck: no JVD Endocrine:  No thryomegaly Vascular: No carotid bruits; FA pulses 2+ bilaterally without bruits  Cardiac:  normal S1, S2; RRR; no murmur  Lungs:  clear to auscultation bilaterally, no wheezing, rhonchi or rales  Abd: soft, nontender, no hepatomegaly  Ext: no edema Musculoskeletal:  No deformities, BUE and BLE strength normal and equal Skin: warm and dry  Neuro:  CNs 2-12 intact, no focal abnormalities noted Psych:  Normal affect    Laboratory Data:  High Sensitivity Troponin:   Recent Labs  Lab 02/24/20 0625  TROPONINIHS 202*      Chemistry Recent Labs  Lab 02/24/20 0625  NA 135  K 4.1  CL 98  CO2 25  GLUCOSE 178*  BUN 19  CREATININE 1.16  CALCIUM 9.6  GFRNONAA >60  GFRAA >60  ANIONGAP 12    Recent Labs  Lab 02/24/20 0625  PROT 7.7  ALBUMIN 4.0  AST 29  ALT 31  ALKPHOS 75  BILITOT 1.1   Hematology Recent Labs  Lab 02/24/20 0625  WBC 10.2  RBC 4.75  HGB 15.3  HCT 45.7  MCV 96.2  MCH 32.2  MCHC 33.5  RDW 13.1  PLT 149*   BNP Recent Labs  Lab 02/24/20 0625  BNP 220.0*    DDimer No results for input(s): DDIMER in the last 168 hours.   Radiology/Studies:  DG Chest Portable 1 View  Result Date: 02/24/2020 CLINICAL DATA:  Chest pain and pressure EXAM: PORTABLE CHEST 1 VIEW COMPARISON:  04/12/2018 FINDINGS: Normal heart size when accounting for mediastinal fat. Mild aortic tortuosity. There has been CABG. Mild interstitial coarsening without visible effusion or pneumothorax. IMPRESSION: Symmetric interstitial prominence, likely mild edema. Electronically Signed   By: Monte Fantasia M.D.   On: 02/24/2020 06:44       HEAR Score (for undifferentiated chest pain):       Assessment and Plan:   1. CAD/Chest pain - extensive history as reported above with prior CABG and multiple interventions. Medical therapy has been limited due to patient refusal for some meds. - admitted just this morning at 6AM, ongoing workup for ACS - chest pain with some fairly atypical features though did have significant relief with NG, intial trop is 202 -  EKG SR with LVH and chronic ST/T changes. He did present hypertensive, some signs of fluid overload.   - in ER started on NG drip, given morphine 2mg  x1, given GI cocktail - currently pain  free - f/u second troponin, if significant uptrend would plan for transfer to [facility] for cath and management of NSTEMI. F/u echo - medical therapy with ASA 81, toprol 25. He agrees to restart statin, we will start atorva 80mg . Stat hep gtt. Will also start  losartan 25mg  daily.    2. Chronic systolic HF - medical therapy has been limited due to patient refusal for some meds. - dizziness on entresto, he stopped taking. He refused low dose ARB previously, will start at this time as he is open to additional meds currently - appears euvolemic by exam.    3. Hyperlipidemia - he previously stopped his statin on his own, at last visit did not want to restart    Addendum 940AM second trop up to 742, we will plan for transfer to [facility] for left heart cath in setting of NSTEMI, telemetetry bed and cardiology service.     For questions or updates, please contact [place] Please consult [email or web address] for contact info under     Signed, Carlyle Dolly, MD  02/24/2020 8:58 AM

## 2020-02-24 NOTE — Interval H&P Note (Signed)
Cath Lab Visit (complete for each Cath Lab visit)  Clinical Evaluation Leading to the Procedure:   ACS: Yes.    Non-ACS:    Anginal Classification: CCS IV  Anti-ischemic medical therapy: No Therapy  Non-Invasive Test Results: No non-invasive testing performed  Prior CABG: Previous CABG      History and Physical Interval Note:  02/24/2020 3:04 PM  Peter [name]  has presented today for surgery, with the diagnosis of n stemi.  The various methods of treatment have been discussed with the patient and family. After consideration of risks, benefits and other options for treatment, the patient has consented to  Procedure(s): LEFT HEART CATH AND CORS/GRAFTS ANGIOGRAPHY (N/A) as a surgical intervention.  The patient's history has been reviewed, patient examined, no change in status, stable for surgery.  I have reviewed the patient's chart and labs.  Questions were answered to the patient's satisfaction.     Sherren Mocha

## 2020-02-24 NOTE — ED Notes (Signed)
Date and time results received: 02/24/20 @ 0933   (use smartphrase ".now" to insert current time)  Test:trponin Critical Value: 742  Name of Provider Notified: Dr Maurene Capes  Orders Received? Or Actions Taken?: see new orders

## 2020-02-24 NOTE — ED Triage Notes (Signed)
Pt c/o chest pain,indigestion that started this weekend, pt was hypertensive with ems 220/120, pt was given two SL nitro with improvement of chest pain and bp.

## 2020-02-25 ENCOUNTER — Inpatient Hospital Stay (HOSPITAL_COMMUNITY): Payer: Medicare Other

## 2020-02-25 DIAGNOSIS — R079 Chest pain, unspecified: Secondary | ICD-10-CM

## 2020-02-25 LAB — BASIC METABOLIC PANEL
Anion gap: 14 (ref 5–15)
BUN: 22 mg/dL (ref 8–23)
CO2: 21 mmol/L — ABNORMAL LOW (ref 22–32)
Calcium: 9.4 mg/dL (ref 8.9–10.3)
Chloride: 100 mmol/L (ref 98–111)
Creatinine, Ser: 1.34 mg/dL — ABNORMAL HIGH (ref 0.61–1.24)
GFR calc Af Amer: 59 mL/min — ABNORMAL LOW (ref >60)
GFR calc non Af Amer: 51 mL/min — ABNORMAL LOW (ref >60)
Glucose, Bld: 201 mg/dL — ABNORMAL HIGH (ref 70–99)
Potassium: 4.2 mmol/L (ref 3.5–5.1)
Sodium: 135 mmol/L (ref 135–145)

## 2020-02-25 LAB — GLUCOSE, CAPILLARY
Glucose-Capillary: 190 mg/dL — ABNORMAL HIGH (ref 70–99)
Glucose-Capillary: 150 mg/dL — ABNORMAL HIGH (ref 70–99)
Glucose-Capillary: 186 mg/dL — ABNORMAL HIGH (ref 70–99)
Glucose-Capillary: 276 mg/dL — ABNORMAL HIGH (ref 70–99)

## 2020-02-25 LAB — CBC
HCT: 40.9 % (ref 39.0–52.0)
Hemoglobin: 14.2 g/dL (ref 13.0–17.0)
MCH: 32.4 pg (ref 26.0–34.0)
MCHC: 34.7 g/dL (ref 30.0–36.0)
MCV: 93.4 fL (ref 80.0–100.0)
Platelets: 159 10*3/uL (ref 150–400)
RBC: 4.38 MIL/uL (ref 4.22–5.81)
RDW: 13.1 % (ref 11.5–15.5)
WBC: 13.9 10*3/uL — ABNORMAL HIGH (ref 4.0–10.5)
nRBC: 0 % (ref 0.0–0.2)

## 2020-02-25 LAB — LIPID PANEL
Cholesterol: 239 mg/dL — ABNORMAL HIGH (ref 0–200)
HDL: 41 mg/dL (ref >40)
LDL Cholesterol: 183 mg/dL — ABNORMAL HIGH (ref 0–99)
Total CHOL/HDL Ratio: 5.8 RATIO
Triglycerides: 76 mg/dL (ref <150)
VLDL: 15 mg/dL (ref 0–40)

## 2020-02-25 LAB — ECHOCARDIOGRAM COMPLETE
Height: 72 in
Weight: 3040 oz

## 2020-02-25 LAB — HEPARIN LEVEL (UNFRACTIONATED): Heparin Unfractionated: 0.25 IU/mL — ABNORMAL LOW (ref 0.30–0.70)

## 2020-02-25 MED ORDER — INSULIN ASPART 100 UNIT/ML ~~LOC~~ SOLN
0.0000 [IU] | Freq: Three times a day (TID) | SUBCUTANEOUS | Status: DC
  Administered 2020-02-25: 1 [IU] via SUBCUTANEOUS
  Administered 2020-02-25: 2 [IU] via SUBCUTANEOUS
  Administered 2020-02-26: 1 [IU] via SUBCUTANEOUS

## 2020-02-25 MED ORDER — ALUM & MAG HYDROXIDE-SIMETH 200-200-20 MG/5ML PO SUSP
30.0000 mL | Freq: Once | ORAL | Status: AC
  Administered 2020-02-25: 30 mL via ORAL
  Filled 2020-02-25: qty 30

## 2020-02-25 MED ORDER — INSULIN ASPART 100 UNIT/ML ~~LOC~~ SOLN
0.0000 [IU] | Freq: Every day | SUBCUTANEOUS | Status: DC
  Administered 2020-02-25: 3 [IU] via SUBCUTANEOUS

## 2020-02-25 NOTE — Progress Notes (Signed)
Peter [name] for heparin IV Indication: ACS/chest pain/NSTEMI  Allergies  Allergen Reactions  . Dye Fdc Red [Red Dye] Hives    hives  . Ivp Dye [Iodinated Diagnostic Agents] Hives  . Plavix [Clopidogrel] Hives    Unsure if dye or Plavix  . Keflex [Cephalexin] Other (See Comments)    hoarsness    Patient Measurements: Height: 6' (182.9 cm) Weight: 190 lb (86.2 kg) IBW/kg (Calculated) : 77.6 Heparin Dosing Weight: 86 kg  Vital Signs: Temp: 97.4 F (36.3 C) (03/23 1033) Temp Source: Oral (03/23 1033) BP: 151/87 (03/23 1033) Pulse Rate: 80 (03/23 1033)  Labs: Recent Labs    02/24/20 [id number] 02/24/20 0625 02/24/20 0832 02/24/20 1033 02/24/20 1937 02/25/20 0838  HGB 15.3   < >  --   --  15.2 14.2  HCT 45.7  --   --   --  43.5 40.9  PLT 149*  --   --   --  133* 159  HEPARINUNFRC  --   --   --   --   --  0.25*  CREATININE 1.16  --   --   --  1.12 1.34*  TROPONINIHS 202*  --  742* 1,543*  --   --    < > = values in this interval not displayed.    Estimated Creatinine Clearance: 50.7 mL/min (A) (by C-G formula based on SCr of 1.34 mg/dL (H)).   Medical History: Past Medical History:  Diagnosis Date  . AAA (abdominal aortic aneurysm) ([place])   . CAD (coronary artery disease)    a. s/p CABG in 1997 b. stent to SVG-PDA and PTCA of LAD in 2003 c. cath in 2015 showing patent SVG-OM1-OM2, SVG-RI, SVG-D1, and LIMA-LAD with occluded SVG-PDA and severe stenosis of mid-LAD at LIMA insertion with DES placed d. 04/2018: similar results to 2015 with patent LAD stent; occlusion of small caliber D1 stenosis with medical management recommended.   . Chronic back pain 07/25/2018   Chronic lumbar pain uses hydrocodone intermittently  . Chronic systolic CHF (congestive heart failure) ([place])   . COPD (chronic obstructive pulmonary disease) ([place])   . Diabetes mellitus    diet controlled  . Dyslipidemia   . Hypertension   . Myocardial infarction [facility]) 1997    stents x2 (2003/2007)    Medications:  Medications Prior to Admission  Medication Sig Dispense Refill Last Dose  . Aloe Vera Juice LIQD Take 8 oz by mouth daily.   02/23/2020 at Unknown time  . aspirin 325 MG tablet Take 325 mg by mouth daily as needed.   02/24/2020 at Unknown time  . HYDROcodone-acetaminophen (NORCO) 10-325 MG tablet Take 1 tablet by mouth 3 (three) times daily as needed. 60 tablet 0 Past Week at Unknown time  . metoprolol succinate (TOPROL-XL) 25 MG 24 hr tablet Take 1 tablet (25 mg total) by mouth daily. 90 tablet 1 02/23/2020 at 1200  . Accu-Chek FastClix Lancets MISC USE 1  TO CHECK GLUCOSE TWICE DAILY DUE TO FLUCTUATING SUGARS (Patient not taking: Reported on 02/24/2020) 204 each 5 Not Taking at Unknown time  . ACCU-CHEK GUIDE test strip USE 1 STRIP TO CHECK GLUCOSE TWICE DAILY DUE  TO  FLUCTUATING  SUGARS (Patient not taking: Reported on 02/24/2020) 100 each 5 Not Taking at Unknown time  . aspirin EC 81 MG tablet Take 1 tablet (81 mg total) by mouth daily. (Patient not taking: Reported on 02/24/2020) 90 tablet 3 Not Taking at Unknown time  .  gabapentin (NEURONTIN) 100 MG capsule Take 1 capsule (100 mg total) by mouth 3 (three) times daily. (Patient not taking: Reported on 09/10/2019) 90 capsule 3 Not Taking at Unknown time  . HYDROcodone-acetaminophen (NORCO) 10-325 MG tablet Take 1 tablet by mouth 3 (three) times daily as needed. (Patient not taking: Reported on 02/24/2020) 60 tablet 0 Not Taking at Unknown time  . HYDROcodone-acetaminophen (NORCO) 10-325 MG tablet 1 tid prn pain (Patient not taking: Reported on 02/24/2020) 60 tablet 0 Not Taking at Unknown time  . nitroGLYCERIN (NITROSTAT) 0.4 MG SL tablet Place 1 tablet (0.4 mg total) under the tongue every 5 (five) minutes as needed for chest pain. 30 tablet 3 unknown  . Zoster Vaccine Adjuvanted [facility]) injection Inject times one dose (Patient not taking: Reported on 09/10/2019) 0.5 mL 1 Not Taking at Unknown time     Assessment: Pharmacy consulted to dose heparin in patient with ACS/NSTEMI. No AC PTA.   Underwent cardiac cath with successful SVG-PCI with stenting of proximal and distal body of SVG-diagonal on 3/22. S/p cath patient had continued 10/10 chest pain and heparin was restarted -heparin level = 0.25  Goal of Therapy:  Heparin level 0.3-0.7 units/ml Monitor platelets by anticoagulation protocol: Yes   Plan:  -Increase heparin to 1050 units/hr -Daily heparin level and CBC  Hildred Laser, PharmD Clinical Pharmacist **Pharmacist phone directory can now be found on [email or web address] ([email or web address]).  Listed under [place].

## 2020-02-25 NOTE — Progress Notes (Signed)
  Echocardiogram 2D Echocardiogram has been performed.  Geoffery Lyons Swaim 02/25/2020, 10:24 AM

## 2020-02-25 NOTE — Progress Notes (Addendum)
Progress Note  Patient Name: Peter [name] Date of Encounter: 02/25/2020  Primary Cardiologist: Carlyle Dolly, MD   Subjective   Pt doesn't remember severe chest pain last night. He is frustrated that his nitro was turned off (bottle is empty), but he remains chest pain free. Has not walked yet. Heparin still running.   Inpatient Medications    Scheduled Meds: . aspirin  81 mg Oral Daily  . atorvastatin  80 mg Oral q1800  . losartan  25 mg Oral Daily  . metoprolol succinate  25 mg Oral Daily  . sodium chloride flush  3 mL Intravenous Q12H  . ticagrelor  90 mg Oral BID   Continuous Infusions: . sodium chloride    . heparin 1,000 Units/hr (02/25/20 0218)  . nitroGLYCERIN 55 mcg/min (02/25/20 0739)   PRN Meds: sodium chloride, acetaminophen, HYDROcodone-acetaminophen, morphine injection, ondansetron (ZOFRAN) IV, sodium chloride flush   Vital Signs    Vitals:   02/25/20 0147 02/25/20 0247 02/25/20 0347 02/25/20 0753  BP: 140/89 109/81 111/72 138/86  Pulse: 87 83 71 89  Resp: 17 (!) 22 17   Temp:   98.6 F (37 C) 97.8 F (36.6 C)  TempSrc:   Oral Oral  SpO2: 95% 92% 92% 98%  Weight:      Height:        Intake/Output Summary (Last 24 hours) at 02/25/2020 0926 Last data filed at 02/25/2020 0400 Gross per 24 hour  Intake 1179.32 ml  Output 550 ml  Net 629.32 ml   Last 3 Weights 02/24/2020 10/25/2019 08/23/2019  Weight (lbs) 190 lb 188 lb 192 lb  Weight (kg) 86.183 kg 85.276 kg 87.091 kg      Telemetry    Sinus rhythm in the 80s - Personally Reviewed  ECG    Sinus rhythm with HR 77, QTc 534, TWI more pronounced in V3-6 - Personally Reviewed  Physical Exam   GEN: No acute distress.   Neck: No JVD Cardiac: RRR, no murmurs, rubs, or gallops.  Respiratory: Clear to auscultation bilaterally. GI: Soft, nontender, non-distended  MS: No edema; No deformity. Neuro:  Nonfocal  Psych: Normal affect  Right groin site is C/D/I  Labs    High Sensitivity  Troponin:   Recent Labs  Lab 02/24/20 0625 02/24/20 0832 02/24/20 1033  TROPONINIHS 202* 742* 1,543*      Chemistry Recent Labs  Lab 02/24/20 0625 02/24/20 1937  NA 135  --   K 4.1  --   CL 98  --   CO2 25  --   GLUCOSE 178*  --   BUN 19  --   CREATININE 1.16 1.12  CALCIUM 9.6  --   PROT 7.7  --   ALBUMIN 4.0  --   AST 29  --   ALT 31  --   ALKPHOS 75  --   BILITOT 1.1  --   GFRNONAA >60 >60  GFRAA >60 >60  ANIONGAP 12  --      Hematology Recent Labs  Lab 02/24/20 0625 02/24/20 1937  WBC 10.2 10.3  RBC 4.75 4.68  HGB 15.3 15.2  HCT 45.7 43.5  MCV 96.2 92.9  MCH 32.2 32.5  MCHC 33.5 34.9  RDW 13.1 12.9  PLT 149* 133*    BNP Recent Labs  Lab 02/24/20 0625  BNP 220.0*     DDimer No results for input(s): DDIMER in the last 168 hours.   Radiology    CARDIAC CATHETERIZATION  Result Date: 02/24/2020 1.  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 2. S/P CABG with continued patency of the LIMA-LAD, sequential SVG-OM1 and OM2, and severe stenosis of the SVG-diagonal 3. Chronic occlusion of the SVG-PDA/PLA branches with left-to-right collaterals present 4. Successful SVG-PCI with stenting of the proximal and distal body of the SVG-diagonal, with transient no-reflow and severe small vessel distal disease. Recommend aggressive medical therapy, minimum 12 months of ASA/ticagrelor consider long-term if tolerated  DG Chest Portable 1 View  Result Date: 02/24/2020 CLINICAL DATA:  Chest pain and pressure EXAM: PORTABLE CHEST 1 VIEW COMPARISON:  04/12/2018 FINDINGS: Normal heart size when accounting for mediastinal fat. Mild aortic tortuosity. There has been CABG. Mild interstitial coarsening without visible effusion or pneumothorax. IMPRESSION: Symmetric interstitial prominence, likely mild edema. Electronically Signed   By: Monte Fantasia M.D.   On: 02/24/2020 06:44    Cardiac Studies   Left heart cath 02/24/20: 1. Severe  native vessel CAD with total occlusion of the RCA, LAD, LCx, and ramus. 2. S/P CABG with continued patency of the LIMA-LAD, sequential SVG-OM1 and OM2, and severe stenosis of the SVG-diagonal 3. Chronic occlusion of the SVG-PDA/PLA branches with left-to-right collaterals present 4. Successful SVG-PCI with stenting of the proximal and distal body of the SVG-diagonal, with transient no-reflow and severe small vessel distal disease.   Recommend aggressive medical therapy, minimum 12 months of ASA/ticagrelor consider long-term if tolerated   Diagnostic Dominance: Right  Intervention     Patient Profile     79 y.o. male with extensive history of CAD s/p CABG in 1997, stenting to vein grafts, medication refusal, chronic systolic heart failure, HTN, HLD, COPD, and AAA (followee by vascular).  Assessment & Plan    1. CAD s/p CABG - left heart cath yesterday revealed chronic occlusionof the SVG-PDA/PLA branches with left to right collaterals - proximal and distal SVG-diagonal stenosis successfully treated with stenting x 2 - pt wil discharge on ASA and brilinta for at least 12 months, likely long term DAPT if tolerated - he tolerated the procedure well - overnight, had 10/10 chest pain and nitro gtt was titrated up to 55 mcg - has since been turned off and he remains chest pain free - heparin drip still running - hs troponin 202 --> 742 --> 1543 - will monitor for today to make sure he is chest pain free and is tolerating his medications   2. Chronic systolic heart failure - became dizzy on entresto - was amenable to trying low dose losartan just prior to this LHC - complicated situation since we have had issues keeping him on GDMT due to patient refusal of some medications - on losartan 25 mg - continue BB   3. Hyperlipidemia 09/05/2019: Cholesterol, Total 214; HDL 33; LDL Chol Calc (NIH) 158; Triglycerides 127 - restarted on 80 mg lipitor - had previously stopped his statin,  didn't want to take it anymore   4. Hypertension - pressure has been controlled overnight - continue present medications    For questions or updates, please contact [place] Please consult [email or web address] for contact info under        Signed, Ledora Bottcher, PA  02/25/2020, 9:26 AM    I have personally seen and examined this patient. I agree with the assessment and plan as outlined above.  He is doing well this morning post PCI/stenting of the SVG to Diagonal with two DES. Continue ASA/Brilinta, statin and beta blocker. Given chest pain overnight, will monitor  today on telemetry. Will not restart IV NtG. May need to add Imdur if he has more pain. Will stop IV heparin.  Probable d/c home tomorrow.   Lauree Chandler 02/25/2020 11:13 AM

## 2020-02-25 NOTE — Progress Notes (Signed)
Inpatient Diabetes Program Recommendations  AACE/ADA: New Consensus Statement on Inpatient Glycemic Control (2015)  Target Ranges:  Prepandial:   less than 140 mg/dL      Peak postprandial:   less than 180 mg/dL (1-2 hours)      Critically ill patients:  140 - 180 mg/dL   Lab Results  Component Value Date   GLUCAP 190 (H) 02/25/2020   HGBA1C 6.9 (H) 09/05/2019    Review of Glycemic Control Results for Peter [name], Peter [name] (MRN [id number]) as of 02/25/2020 10:46  Ref. Range 02/24/2020 22:02 02/25/2020 07:51  Glucose-Capillary Latest Ref Range: 70 - 99 mg/dL 232 (H) 190 (H)   Diabetes history: Type 2 DM Outpatient Diabetes medications: none Current orders for Inpatient glycemic control: none Solumedrol 125 mg x 1  Inpatient Diabetes Program Recommendations:    Consider adding Novolog 0-9 units TID & HS as glucose trends may be elevated today in the setting of post steroids administration.   Thanks, Bronson Curb, MSN, RNC-OB Diabetes Coordinator [phone] (8a-5p)

## 2020-02-25 NOTE — Progress Notes (Signed)
CARDIAC REHAB PHASE I   PRE:  Rate/Rhythm: 79 SR  BP:  Supine: 151/87  Sitting:   Standing:    SaO2: 95%RA  MODE:  Ambulation: 350 ft   POST:  Rate/Rhythm: 87 SR  BP:  Supine:   Sitting: 149/76  Standing:    SaO2: 98%RA 1058-1148 Pt walked 350 ft on RA with rolling walker and asst x 1. Pt stated he has walker at home. No c/o CP. Tolerated well for first walk. Encouraged to walk more with staff. Sitting on side of bed with bed alarm on. Discussed MI restrictions, NTG use, importance of brilinta with stent, heart healthy and low carb food choices, and CRP 2. He stated he attended CRP 2 [place] before. Pt stated he still feels weak and his stomach is not quite right yet. Stressed brilinta several times and not missing doses.  Will follow up tomorrow.    Graylon Good, RN BSN  02/25/2020 11:44 AM

## 2020-02-25 NOTE — Plan of Care (Signed)
  Problem: Education: Goal: Understanding of cardiac disease, CV risk reduction, and recovery process will improve Outcome: Progressing Goal: Understanding of medication regimen will improve Outcome: Progressing   Problem: Activity: Goal: Ability to tolerate increased activity will improve Outcome: Progressing   Problem: Cardiac: Goal: Ability to achieve and maintain adequate cardiopulmonary perfusion will improve Outcome: Progressing

## 2020-02-26 ENCOUNTER — Encounter (HOSPITAL_COMMUNITY): Payer: Self-pay | Admitting: Internal Medicine

## 2020-02-26 LAB — CBC
HCT: 40.3 % (ref 39.0–52.0)
Hemoglobin: 14 g/dL (ref 13.0–17.0)
MCH: 33 pg (ref 26.0–34.0)
MCHC: 34.7 g/dL (ref 30.0–36.0)
MCV: 95 fL (ref 80.0–100.0)
Platelets: 133 10*3/uL — ABNORMAL LOW (ref 150–400)
RBC: 4.24 MIL/uL (ref 4.22–5.81)
RDW: 13.4 % (ref 11.5–15.5)
WBC: 13.1 10*3/uL — ABNORMAL HIGH (ref 4.0–10.5)
nRBC: 0 % (ref 0.0–0.2)

## 2020-02-26 LAB — GLUCOSE, CAPILLARY
Glucose-Capillary: 127 mg/dL — ABNORMAL HIGH (ref 70–99)
Glucose-Capillary: 140 mg/dL — ABNORMAL HIGH (ref 70–99)

## 2020-02-26 LAB — HEMOGLOBIN A1C
Hgb A1c MFr Bld: 7.1 % — ABNORMAL HIGH (ref 4.8–5.6)
Mean Plasma Glucose: 157.07 mg/dL

## 2020-02-26 MED ORDER — ATORVASTATIN CALCIUM 80 MG PO TABS: 80.0000 mg | ORAL_TABLET | Freq: Every day | ORAL | 11 refills | Status: DC

## 2020-02-26 MED ORDER — ASPIRIN EC 81 MG PO TBEC: 81.0000 mg | DELAYED_RELEASE_TABLET | Freq: Every day | ORAL | 3 refills | Status: DC

## 2020-02-26 MED ORDER — TICAGRELOR 90 MG PO TABS: 90.0000 mg | ORAL_TABLET | Freq: Two times a day (BID) | ORAL | 11 refills | Status: DC

## 2020-02-26 MED ORDER — LOSARTAN POTASSIUM 25 MG PO TABS: 25.0000 mg | ORAL_TABLET | Freq: Every day | ORAL | 3 refills | Status: DC

## 2020-02-26 MED FILL — ASPIRIN LOW DOSE 81 MG TBEC: 81 | 90 days supply | Qty: 90 | Fill #0

## 2020-02-26 MED FILL — LOSARTAN POTASSIUM 25 MG TA: 25 | 30 days supply | Qty: 30 | Fill #0

## 2020-02-26 MED FILL — ATORVASTATIN CALCIUM 80 MG: 80 | 30 days supply | Qty: 30 | Fill #0

## 2020-02-26 MED FILL — BRILINTA 90 MG TABLET: 90 | 30 days supply | Qty: 60 | Fill #0

## 2020-02-26 NOTE — Care Management (Signed)
1156 02-26-20 Patient does not have Medicare Part D. Coverage. Patient may need to be switched to Plavix post 30 day supply free. Bethena Roys, RN,BSN Case Manager [phone]

## 2020-02-26 NOTE — Care Management (Signed)
02-26-20 Benefits Check submitted for ticagrelor (BRILINTA) tablet 90 mg : Dose 90 mg : Oral : 2 times daily. Case Manager to follow for cost. Medications sent to [facility] to call family to assist with payment due to patient not having money. Bethena Roys, RN,BSN Case Manager

## 2020-02-26 NOTE — Discharge Instructions (Signed)
Heart Attack A heart attack occurs when blood and oxygen supply to the heart is cut off. A heart attack causes damage to the heart that cannot be fixed. A heart attack is also called a myocardial infarction, or MI. If you think you are having a heart attack, do not wait to see if the symptoms will go away. Get medical help right away. What are the causes? This condition may be caused by:  A fatty substance (plaque) in the blood vessels (arteries). This can block the flow of blood to the heart.  A blood clot in the blood vessels that go to the heart. The blood clot blocks blood flow.  Low blood pressure.  An abnormal heartbeat.  Some diseases, such as problems in red blood cells (anemia)orproblems in breathing (respiratory failure).  Tightening (spasm) of a blood vessel that cuts off blood to the heart.  A tear in a blood vessel of the heart.  High blood pressure. What increases the risk? The following factors may make you more likely to develop this condition:  Aging. The older you are, the higher your risk.  Having a personal or family history of chest pain, heart attack, stroke, or narrowing of the arteries in the legs, arms, head, or stomach (peripheral artery disease).  Being male.  Smoking.  Not getting regular exercise.  Being overweight or obese.  Having high blood pressure.  Having high cholesterol.  Having diabetes.  Drinking too much alcohol.  Using illegal drugs, such as cocaine or methamphetamine. What are the signs or symptoms? Symptoms of this condition include:  Chest pain. It may feel like: ? Crushing or squeezing. ? Tightness, pressure, fullness, or heaviness.  Pain in the arm, neck, jaw, back, or upper body.  Shortness of breath.  Heartburn.  Upset stomach (indigestion).  Feeling like you may vomit (nauseous).  Cold sweats.  Feeling tired.  Sudden light-headedness. How is this treated? A heart attack must be treated as soon as  possible. Treatment may include:  Medicines to: ? Break up or dissolve blood clots. ? Thin blood and help prevent blood clots. ? Treat blood pressure. ? Improve blood flow to the heart. ? Reduce pain. ? Reduce cholesterol.  Procedures to widen a blocked artery and keep it open.  Open heart surgery.  Receiving oxygen.  Making your heart strong again (cardiac rehabilitation) through exercise, education, and counseling. Follow these instructions at home: Medicines  Take over-the-counter and prescription medicines only as told by your doctor. You may need to take medicine: ? To keep your blood from clotting too easily. ? To control blood pressure. ? To lower cholesterol. ? To control heart rhythms.  Do not take these medicines unless your doctor says it is okay: ? NSAIDs, such as ibuprofen. ? Supplements that have vitamin A, vitamin E, or both. ? Hormone replacement therapy that has estrogen with or without progestin. Lifestyle      Do not use any products that have nicotine or tobacco, such as cigarettes, e-cigarettes, and chewing tobacco. If you need help quitting, ask your doctor.  Avoid secondhand smoke.  Exercise regularly. Ask your doctor about a cardiac rehab program.  Eat heart-healthy foods. Your doctor will tell you what foods to eat.  Stay at a healthy weight.  Lower your stress level.  Do not use illegal drugs. Alcohol use  Do not drink alcohol if: ? Your doctor tells you not to drink. ? You are pregnant, may be pregnant, or are planning to become pregnant.    If you drink alcohol: ? Limit how much you use to:  0-1 drink a day for women.  0-2 drinks a day for men. ? Know how much alcohol is in your drink. In the [place] one drink equals one 12 oz bottle of beer (355 mL), one 5 oz glass of wine (148 mL), or one 1 oz glass of hard liquor (44 mL). General instructions  Work with your doctor to treat other problems you may have, such as diabetes or high  blood pressure.  Get screened for depression. Get treatment if needed.  Keep your vaccines up to date. Get the flu shot (influenza vaccine) every year.  Keep all follow-up visits as told by your doctor. This is important. Contact a doctor if:  You feel very sad.  You have trouble doing your daily activities. Get help right away if:  You have sudden, unexplained discomfort in your chest, arms, back, neck, jaw, or upper body.  You have shortness of breath.  You have sudden sweating or clammy skin.  You feel like you may vomit.  You vomit.  You feel tired or weak.  You get light-headed or dizzy.  You feel your heart beating fast.  You feel your heart skipping beats.  You have blood pressure that is higher than 180/120. These symptoms may be an emergency. Do not wait to see if the symptoms will go away. Get medical help right away. Call your local emergency services (911 in the [place] Do not drive yourself to the hospital. Summary  A heart attack occurs when blood and oxygen supply to the heart is cut off.  Do not take NSAIDs unless your doctor says it is okay.  Do not smoke. Avoid secondhand smoke.  Exercise regularly. Ask your doctor about a cardiac rehab program. This information is not intended to replace advice given to you by your health care provider. Make sure you discuss any questions you have with your health care provider. Document Revised: 03/04/2019 Document Reviewed: 03/04/2019 Elsevier Patient Education  [employer].   Femoral Site Care This sheet gives you information about how to care for yourself after your procedure. Your health care provider may also give you more specific instructions. If you have problems or questions, contact your health care provider. What can I expect after the procedure? After the procedure, it is common to have:  Bruising that usually fades within 1-2 weeks.  Tenderness at the site. Follow these instructions at  home: Wound care  Follow instructions from your health care provider about how to take care of your insertion site. Make sure you: ? Wash your hands with soap and water before you change your bandage (dressing). If soap and water are not available, use hand sanitizer. ? Change your dressing as told by your health care provider. ? Leave stitches (sutures), skin glue, or adhesive strips in place. These skin closures may need to stay in place for 2 weeks or longer. If adhesive strip edges start to loosen and curl up, you may trim the loose edges. Do not remove adhesive strips completely unless your health care provider tells you to do that.  Do not take baths, swim, or use a hot tub until your health care provider approves.  You may shower 24-48 hours after the procedure or as told by your health care provider. ? Gently wash the site with plain soap and water. ? Pat the area dry with a clean towel. ? Do not rub the site. This may  cause bleeding.  Do not apply powder or lotion to the site. Keep the site clean and dry.  Check your femoral site every day for signs of infection. Check for: ? Redness, swelling, or pain. ? Fluid or blood. ? Warmth. ? Pus or a bad smell. Activity  For the first 2-3 days after your procedure, or as long as directed: ? Avoid climbing stairs as much as possible. ? Do not squat.  Do not lift anything that is heavier than 10 lb (4.5 kg), or the limit that you are told, until your health care provider says that it is safe.  Rest as directed. ? Avoid sitting for a long time without moving. Get up to take short walks every 1-2 hours.  Do not drive for 24 hours if you were given a medicine to help you relax (sedative). General instructions  Take over-the-counter and prescription medicines only as told by your health care provider.  Keep all follow-up visits as told by your health care provider. This is important. Contact a health care provider if you have:  A  fever or chills.  You have redness, swelling, or pain around your insertion site. Get help right away if:  The catheter insertion area swells very fast.  You pass out.  You suddenly start to sweat or your skin gets clammy.  The catheter insertion area is bleeding, and the bleeding does not stop when you hold steady pressure on the area.  The area near or just beyond the catheter insertion site becomes pale, cool, tingly, or numb. These symptoms may represent a serious problem that is an emergency. Do not wait to see if the symptoms will go away. Get medical help right away. Call your local emergency services (911 in the [place] Do not drive yourself to the hospital. Summary  After the procedure, it is common to have bruising that usually fades within 1-2 weeks.  Check your femoral site every day for signs of infection.  Do not lift anything that is heavier than 10 lb (4.5 kg), or the limit that you are told, until your health care provider says that it is safe. This information is not intended to replace advice given to you by your health care provider. Make sure you discuss any questions you have with your health care provider. Document Revised: 12/04/2017 Document Reviewed: 12/04/2017 Elsevier Patient Education  2020 [employer].

## 2020-02-26 NOTE — Plan of Care (Signed)
  Problem: Education: Goal: Understanding of cardiac disease, CV risk reduction, and recovery process will improve Outcome: Progressing Goal: Understanding of medication regimen will improve Outcome: Progressing   Problem: Activity: Goal: Ability to tolerate increased activity will improve 02/26/2020 0218 by Sheran Luz, RN Outcome: Progressing 02/25/2020 2316 by Sheran Luz, RN Outcome: Progressing   Problem: Cardiac: Goal: Ability to achieve and maintain adequate cardiopulmonary perfusion will improve 02/26/2020 0218 by Sheran Luz, RN Outcome: Progressing 02/25/2020 2316 by Sheran Luz, RN Outcome: Progressing Goal: Vascular access site(s) Level 0-1 will be maintained Outcome: Progressing

## 2020-02-26 NOTE — Progress Notes (Signed)
CARDIAC REHAB PHASE I   PRE:  Rate/Rhythm: 75 SR  BP:  Supine:   Sitting: 134/85  Standing:    SaO2: 94%RA  MODE:  Ambulation: 350 ft   POST:  Rate/Rhythm: 86 SR  BP:  Supine:   Sitting: 153/85  Standing:    SaO2: 97%RA 1036-1125 Pt walked 350 ft on RA with rolling walker with slow pace. No CP. Conversed during walk and no SOB noted. Encouraged pt to walk with walker at home and left ex guidelines. To sitting on side of bed with bed alarm.   Graylon Good, RN BSN  02/26/2020 11:22 AM

## 2020-02-26 NOTE — Plan of Care (Signed)
  Problem: Education: Goal: Understanding of cardiac disease, CV risk reduction, and recovery process will improve Outcome: Completed/Met Goal: Understanding of medication regimen will improve Outcome: Completed/Met Goal: Individualized Educational Video(s) Outcome: Completed/Met   Problem: Activity: Goal: Ability to tolerate increased activity will improve Outcome: Completed/Met   Problem: Cardiac: Goal: Ability to achieve and maintain adequate cardiopulmonary perfusion will improve Outcome: Completed/Met Goal: Vascular access site(s) Level 0-1 will be maintained Outcome: Completed/Met   Problem: Health Behavior/Discharge Planning: Goal: Ability to safely manage health-related needs after discharge will improve Outcome: Completed/Met

## 2020-02-26 NOTE — Discharge Summary (Signed)
Discharge Summary    Patient ID: Peter [name],  MRN: [id number], DOB/AGE: 78-Nov-1942 78 y.o.  Admit date: 02/24/2020 Discharge date: 02/26/2020  Primary Care Provider: Kathyrn Drown Primary Cardiologist: Carlyle Dolly, MD  Discharge Diagnoses    Principal Problem:   NSTEMI (non-ST elevated myocardial infarction) [facility]) Active Problems:   Dyslipidemia   HTN (hypertension), benign   Chest pain   Allergies Allergies  Allergen Reactions  . Dye Fdc Red [Red Dye] Hives    hives  . Ivp Dye [Iodinated Diagnostic Agents] Hives  . Plavix [Clopidogrel] Hives    Unsure if dye or Plavix  . Keflex [Cephalexin] Other (See Comments)    hoarsness    Diagnostic Studies/Procedures    Cath: 02/24/20  1. 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 2. S/P CABG with continued patency of the LIMA-LAD, sequential SVG-OM1 and OM2, and severe stenosis of the SVG-diagonal 3. Chronic occlusion of the SVG-PDA/PLA branches with left-to-right collaterals present 4. Successful SVG-PCI with stenting of the proximal and distal body of the SVG-diagonal, with transient no-reflow and severe small vessel distal disease.    Recommend aggressive medical therapy, minimum 12 months of ASA/ticagrelor consider long-term if tolerated  Diagnostic Dominance: Right  Intervention    Echo: 02/25/20  IMPRESSIONS     1. Left ventricular ejection fraction, by estimation, is 40 to 45%. The  left ventricle has mildly decreased function. The left ventricle  demonstrates regional wall motion abnormalities (see scoring  diagram/findings for description). There is mild  concentric left ventricular hypertrophy. Left ventricular diastolic  parameters are consistent with Grade I diastolic dysfunction (impaired  relaxation).   2. Right ventricular systolic function is normal. The right ventricular  size is normal. Tricuspid regurgitation signal is inadequate for assessing    PA pressure.   3. The mitral valve is grossly normal. No evidence of mitral valve  regurgitation. No evidence of mitral stenosis.   4. The aortic valve is tricuspid. Aortic valve regurgitation is trivial.  Mild aortic valve sclerosis is present, with no evidence of aortic valve  stenosis.   5. Aortic dilatation noted. 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   6. The inferior vena cava is normal in size with greater than 50%  respiratory variability, suggesting right atrial pressure of 3 mmHg.   Comparison(s): A prior study was performed on 04/12/2018. Changes from prior  study are noted. Globally EF has improved ~40-45%. New Inferior/posterior  basal to mid WMA. Septal/apical WMA have improved and could just be postop  septum on prior study.  _____________   History of Present Illness     Peter [name] 79 yo male history of extensive history of CAD with CABG in 1997, stent to SVG-PDA and PTCA of the LAD in 05/2002. 2008 heart cath [facility] had repeat stenting, he was unsure of the details. Cath 2015 showing patent SVG-OM1-OM2, SVG-RI, SVG-D1, and LIMA-LAD with occluded SVG-PDA and severe stenosis of mid-LAD at LIMA insertion with DES placed. 04/2018 cath showed severe 3-vessel CAD with patent LIMA-LAD, patent stent along mid-LAD, patent SVG-OM1-OM2, and patent SVG-RI with occlusion of D1 and known occlusion of SVG-PDA. The diagonal occlusion was thought to be old, therefore continued medical therapy was recommended. Also has a history of chronic systolic HF , HTN, HL, COPD. AAA followed by vascular   Medical therapy has been limited by his self termination of medications.    Presented  with chest pain. Reported for last several months intermittent chest pressure, he usually he attributed it to eating sweets when the symptoms would come on. Would get better with drinking aloe vera juice at home. On Saturday pain started after eating some donuts. More severe and longer  lasting than prior. Would last up to 2-3 hours. No associated SOB. 10/10 pressure midchest, can radiate down both arms, not positional. Ongoing symptoms  on Sunday and Monday not related to food. The morning of admission around 4AM woke up from sleep with pain that would not go away. Called EMS, given NG with relief. In ER started on NG drip with relief as well.     ER vitals: 181/103 p 74 92% RA  WBC 10/2 Hgb 15.3 Plt 149 K 4.1 Cr 1.16 Lipase 23 BNP 220 hstrop 202--> EKG SR, LVH, chronic ST/T changes CXR probable pulm edema  Given symptoms he was admitted, started on IV nitro, heparin and transferred to [facility] for further management.   Hospital Course     1. CAD s/p CABG: - left heart cath revealed chronic occlusionof the SVG-PDA/PLA branches with left to right collaterals - proximal and distal SVG-diagonal stenosis successfully treated with stenting x 2. hs troponin 202 --> 742 --> 1543 - pt wil discharge on ASA and brilinta for at least 12 months, likely long term DAPT if tolerated - he tolerated the procedure well. Did have some chest pain post cath, but was able to wean from nitro. Walked with cardiac rehab without recurrent pain.    2. Chronic systolic heart failure: became dizzy on entresto - was amenable to trying low dose losartan just prior to this LHC. Was started on low dose losartan post cath and tolerating well.  - continue BB   3. Hyperlipidemia: 09/05/2019: Cholesterol, Total 214; HDL 33; LDL Chol Calc (NIH) 158; Triglycerides 127 - restarted on 80 mg Lipitor, will need LFT/FLP in 8 weeks.   4. Hypertension: pressure has been controlled with present medications  5. Elevated Hgb A1c: noted at 7.1 this admission. Have asked that he follow up with PCP as an outpatient regarding further management.   General: Well developed, well nourished, male appearing in no acute distress. Head: Normocephalic, atraumatic.  Neck: Supple without bruits, JVD. Lungs:  Resp regular and  unlabored, CTA. Heart: RRR, S1, S2, no S3, S4, or murmur; no rub. Abdomen: Soft, non-tender, non-distended with normoactive bowel sounds. No hepatomegaly. No rebound/guarding. No obvious abdominal masses. Extremities: No clubbing, cyanosis, edema. Distal pedal pulses are 2+ bilaterally. Right femoral cath site stable without bruising or hematoma Neuro: Alert and oriented X 3. Moves all extremities spontaneously. Psych: Normal affect.  [name] was seen by Dr. Angelena Form and determined stable for discharge home. Follow up in the office has been arranged. Medications are listed below.   _____________  Discharge Vitals Blood pressure 135/70, pulse 69, temperature 98.1 F (36.7 C), temperature source Oral, resp. rate 16, height 6' (1.829 m), weight 83.5 kg, SpO2 95 %.  Filed Weights   02/24/20 0613 02/26/20 0504  Weight: 86.2 kg 83.5 kg    Labs & Radiologic Studies    CBC Recent Labs    02/24/20 0625 02/24/20 1937 02/25/20 0838 02/26/20 0405  WBC 10.2   < > 13.9* 13.1*  NEUTROABS 6.4  --   --   --   HGB 15.3   < > 14.2 14.0  HCT 45.7   < > 40.9 40.3  MCV 96.2   < >  93.4 95.0  PLT 149*   < > 159 133*   < > = values in this interval not displayed.   Basic Metabolic Panel Recent Labs    02/24/20 0625 02/24/20 0625 02/24/20 1937 02/25/20 0838  NA 135  --   --  135  K 4.1  --   --  4.2  CL 98  --   --  100  CO2 25  --   --  21*  GLUCOSE 178*  --   --  201*  BUN 19  --   --  22  CREATININE 1.16   < > 1.12 1.34*  CALCIUM 9.6  --   --  9.4   < > = values in this interval not displayed.   Liver Function Tests Recent Labs    02/24/20 0625  AST 29  ALT 31  ALKPHOS 75  BILITOT 1.1  PROT 7.7  ALBUMIN 4.0   Recent Labs    02/24/20 0625  LIPASE 23   Cardiac Enzymes No results for input(s): CKTOTAL, CKMB, CKMBINDEX, TROPONINI in the last 72 hours. BNP Invalid input(s): POCBNP D-Dimer No results for input(s): DDIMER in the last 72 hours. Hemoglobin  A1C Recent Labs    02/26/20 0405  HGBA1C 7.1*   Fasting Lipid Panel Recent Labs    02/25/20 0838  CHOL 239*  HDL 41  LDLCALC 183*  TRIG 76  CHOLHDL 5.8   Thyroid Function Tests No results for input(s): TSH, T4TOTAL, T3FREE, THYROIDAB in the last 72 hours.  Invalid input(s): FREET3 _____________  CARDIAC CATHETERIZATION  Result Date: 02/24/2020 1. 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 2. S/P CABG with continued patency of the LIMA-LAD, sequential SVG-OM1 and OM2, and severe stenosis of the SVG-diagonal 3. Chronic occlusion of the SVG-PDA/PLA branches with left-to-right collaterals present 4. Successful SVG-PCI with stenting of the proximal and distal body of the SVG-diagonal, with transient no-reflow and severe small vessel distal disease. Recommend aggressive medical therapy, minimum 12 months of ASA/ticagrelor consider long-term if tolerated  DG Chest Portable 1 View  Result Date: 02/24/2020 CLINICAL DATA:  Chest pain and pressure EXAM: PORTABLE CHEST 1 VIEW COMPARISON:  04/12/2018 FINDINGS: Normal heart size when accounting for mediastinal fat. Mild aortic tortuosity. There has been CABG. Mild interstitial coarsening without visible effusion or pneumothorax. IMPRESSION: Symmetric interstitial prominence, likely mild edema. Electronically Signed   By: Monte Fantasia M.D.   On: 02/24/2020 06:44   ECHOCARDIOGRAM COMPLETE  Result Date: 02/25/2020    ECHOCARDIOGRAM REPORT   Patient Name:   MERVYN VANSOMEREN Date of Exam: 02/25/2020 Medical Rec #:  [id number]      Height:       72.0 in Accession #:    [id number]     Weight:       190.0 lb Date of Birth:  1941-11-12      BSA:          2.085 m Patient Age:    89 years       BP:           138/86 mmHg Patient Gender: M              HR:           76 bpm. Exam Location:  Inpatient Procedure: 2D Echo, Cardiac Doppler and Color Doppler Indications:    Chest Pain 786.50  History:        Patient has prior history  of  Echocardiogram examinations, most                 recent 04/12/2018. CAD, Prior CABG, COPD and Stroke,                 Arrythmias:non-specific ST changes, Signs/Symptoms:Chest Pain;                 Risk Factors:Hypertension, Diabetes, Dyslipidemia and Former                 Smoker. AAA. Elevated troponin.  Sonographer:    Vickie Epley RDCS Referring Phys: [phone] [place]  1. Left ventricular ejection fraction, by estimation, is 40 to 45%. The left ventricle has mildly decreased function. The left ventricle demonstrates regional wall motion abnormalities (see scoring diagram/findings for description). There is mild concentric left ventricular hypertrophy. Left ventricular diastolic parameters are consistent with Grade I diastolic dysfunction (impaired relaxation).  2. Right ventricular systolic function is normal. The right ventricular size is normal. Tricuspid regurgitation signal is inadequate for assessing PA pressure.  3. The mitral valve is grossly normal. No evidence of mitral valve regurgitation. No evidence of mitral stenosis.  4. The aortic valve is tricuspid. Aortic valve regurgitation is trivial. Mild aortic valve sclerosis is present, with no evidence of aortic valve stenosis.  5. Aortic dilatation noted. There is mild dilatation of the aortic root measuring 43 mm.  6. The inferior vena cava is normal in size with greater than 50% respiratory variability, suggesting right atrial pressure of 3 mmHg. Comparison(s): A prior study was performed on 04/12/2018. Changes from prior study are noted. Globally EF has improved ~40-45%. New Inferior/posterior basal to mid WMA. Septal/apical WMA have improved and could just be postop septum on prior study. FINDINGS  Left Ventricle: Left ventricular ejection fraction, by estimation, is 40 to 45%. The left ventricle has mildly decreased function. The left ventricle demonstrates regional wall motion abnormalities. The left ventricular internal cavity size  was normal in size. There is mild concentric left ventricular hypertrophy. Abnormal (paradoxical) septal motion consistent with post-operative status. Left ventricular diastolic parameters are consistent with Grade I diastolic dysfunction (impaired relaxation). Normal left ventricular filling pressure.  LV Wall Scoring: The inferior wall and posterior wall are hypokinetic. Right Ventricle: The right ventricular size is normal. No increase in right ventricular wall thickness. Right ventricular systolic function is normal. Tricuspid regurgitation signal is inadequate for assessing PA pressure. Left Atrium: Left atrial size was normal in size. Right Atrium: Right atrial size was normal in size. Pericardium: There is no evidence of pericardial effusion. Presence of pericardial fat pad. Mitral Valve: The mitral valve is grossly normal. Mild mitral annular calcification. No evidence of mitral valve regurgitation. No evidence of mitral valve stenosis. Tricuspid Valve: The tricuspid valve is grossly normal. Tricuspid valve regurgitation is not demonstrated. No evidence of tricuspid stenosis. Aortic Valve: The aortic valve is tricuspid. Aortic valve regurgitation is trivial. Mild aortic valve sclerosis is present, with no evidence of aortic valve stenosis. Pulmonic Valve: The pulmonic valve was grossly normal. Pulmonic valve regurgitation is not visualized. No evidence of pulmonic stenosis. Aorta: Aortic dilatation noted. There is mild dilatation of the aortic root measuring 43 mm. Venous: The inferior vena cava is normal in size with greater than 50% respiratory variability, suggesting right atrial pressure of 3 mmHg. IAS/Shunts: No atrial level shunt detected by color flow Doppler.  LEFT VENTRICLE PLAX 2D LVIDd:         5.10 cm  Diastology LVIDs:         3.83 cm      LV e' lateral:   6.00 cm/s LV PW:         0.86 cm      LV E/e' lateral: 9.8 LV IVS:        0.87 cm      LV e' medial:    3.68 cm/s LVOT diam:     2.20 cm       LV E/e' medial:  16.0 LV SV:         68 LV SV Index:   33 LVOT Area:     3.80 cm  LV Volumes (MOD) LV vol d, MOD A2C: 132.0 ml LV vol d, MOD A4C: 141.0 ml LV vol s, MOD A2C: 59.3 ml LV vol s, MOD A4C: 77.3 ml LV SV MOD A2C:     72.7 ml LV SV MOD A4C:     141.0 ml LV SV MOD BP:      71.0 ml RIGHT VENTRICLE RV S prime:     8.03 cm/s TAPSE (M-mode): 1.3 cm LEFT ATRIUM             Index       RIGHT ATRIUM           Index LA diam:        5.10 cm 2.45 cm/m  RA Area:     14.20 cm LA Vol (A2C):   48.2 ml 23.12 ml/m RA Volume:   33.20 ml  15.93 ml/m LA Vol (A4C):   35.3 ml 16.93 ml/m LA Biplane Vol: 42.3 ml 20.29 ml/m  AORTIC VALVE LVOT Vmax:   84.60 cm/s LVOT Vmean:  51.500 cm/s LVOT VTI:    0.179 m  AORTA Ao Root diam: 4.30 cm MITRAL VALVE MV Area (PHT): 2.24 cm     SHUNTS MV Decel Time: 338 msec     Systemic VTI:  0.18 m MV E velocity: 58.70 cm/s   Systemic Diam: 2.20 cm MV A velocity: 104.00 cm/s MV E/A ratio:  0.56 Eleonore Chiquito MD Electronically signed by Eleonore Chiquito MD Signature Date/Time: 02/25/2020/1:19:17 PM    Final    Disposition   Pt is being discharged home today in good condition.  Follow-up Plans & Appointments    Follow-up Information    Isaiah Serge, NP Follow up on 03/09/2020.   Specialties: Cardiology, Radiology Why: at 1pm for your follow up appt.  Contact information: [place] [id number] [phone]          Discharge Instructions    Amb Referral to Cardiac Rehabilitation   Complete by: As directed    Diagnosis:  NSTEMI Coronary Stents     After initial evaluation and assessments completed: Virtual Based Care may be provided alone or in conjunction with Phase 2 Cardiac Rehab based on patient barriers.: Yes   Call MD for:  redness, tenderness, or signs of infection (pain, swelling, redness, odor or green/yellow discharge around incision site)   Complete by: As directed    Diet - low sodium heart healthy   Complete by: As directed    Discharge  instructions   Complete by: As directed    Groin Site Care Refer to this sheet in the next few weeks. These instructions provide you with information on caring for yourself after your procedure. Your caregiver may also give you more specific instructions. Your treatment has been planned according to current medical practices, but problems  sometimes occur. Call your caregiver if you have any problems or questions after your procedure. HOME CARE INSTRUCTIONS You may shower 24 hours after the procedure. Remove the bandage (dressing) and gently wash the site with plain soap and water. Gently pat the site dry.  Do not apply powder or lotion to the site.  Do not sit in a bathtub, swimming pool, or whirlpool for 5 to 7 days.  No bending, squatting, or lifting anything over 10 pounds (4.5 kg) as directed by your caregiver.  Inspect the site at least twice daily.  Do not drive home if you are discharged the same day of the procedure. Have someone else drive you.  You may drive 24 hours after the procedure unless otherwise instructed by your caregiver.  What to expect: Any bruising will usually fade within 1 to 2 weeks.  Blood that collects in the tissue (hematoma) may be painful to the touch. It should usually decrease in size and tenderness within 1 to 2 weeks.  SEEK IMMEDIATE MEDICAL CARE IF: You have unusual pain at the groin site or down the affected leg.  You have redness, warmth, swelling, or pain at the groin site.  You have drainage (other than a small amount of blood on the dressing).  You have chills.  You have a fever or persistent symptoms for more than 72 hours.  You have a fever and your symptoms suddenly get worse.  Your leg becomes pale, cool, tingly, or numb.  You have heavy bleeding from the site. Hold pressure on the site. Marland Kitchen  PLEASE DO NOT MISS ANY DOSES OF YOUR BRILINTA!!!!! Also keep a log of you blood pressures and bring back to your follow up appt. Please call the office with  any questions.   Patients taking blood thinners should generally stay away from medicines like ibuprofen, Advil, Motrin, naproxen, and Aleve due to risk of stomach bleeding. You may take Tylenol as directed or talk to your primary doctor about alternatives.   Increase activity slowly   Complete by: As directed      Discharge Medications     Medication List    STOP taking these medications   Accu-Chek FastClix Lancets Misc   Accu-Chek Guide test strip Generic drug: glucose blood   gabapentin 100 MG capsule Commonly known as: NEURONTIN   Zoster Vaccine Adjuvanted injection Commonly known as: Shingrix     TAKE these medications   Aloe Vera Juice Liqd Take 8 oz by mouth daily.   aspirin EC 81 MG tablet Take 1 tablet (81 mg total) by mouth daily. What changed: Another medication with the same name was removed. Continue taking this medication, and follow the directions you see here.   atorvastatin 80 MG tablet Commonly known as: LIPITOR Take 1 tablet (80 mg total) by mouth daily at 6 PM.   HYDROcodone-acetaminophen 10-325 MG tablet Commonly known as: NORCO Take 1 tablet by mouth 3 (three) times daily as needed. What changed: Another medication with the same name was removed. Continue taking this medication, and follow the directions you see here.   losartan 25 MG tablet Commonly known as: COZAAR Take 1 tablet (25 mg total) by mouth daily.   metoprolol succinate 25 MG 24 hr tablet Commonly known as: TOPROL-XL Take 1 tablet (25 mg total) by mouth daily.   nitroGLYCERIN 0.4 MG SL tablet Commonly known as: NITROSTAT Place 1 tablet (0.4 mg total) under the tongue every 5 (five) minutes as needed for chest pain.  ticagrelor 90 MG Tabs tablet Commonly known as: BRILINTA Take 1 tablet (90 mg total) by mouth 2 (two) times daily.        Yes                               AHA/ACC Clinical Performance & Quality Measures: 1. Aspirin prescribed? - Yes 2. ADP Receptor  Inhibitor (Plavix/Clopidogrel, Brilinta/Ticagrelor or Effient/Prasugrel) prescribed (includes medically managed patients)? - Yes 3. Beta Blocker prescribed? - Yes 4. High Intensity Statin (Lipitor 40-80mg  or Crestor 20-40mg ) prescribed? - Yes 5. EF assessed during THIS hospitalization? - Yes 6. For EF <40%, was ACEI/ARB prescribed? - Yes 7. For EF <40%, Aldosterone Antagonist (Spironolactone or Eplerenone) prescribed? - Not Applicable (EF >/= [id number]) 8. Cardiac Rehab Phase II ordered (Included Medically managed Patients)? - Yes    Outstanding Labs/Studies   FLP/LFTs in 8 weeks.   Duration of Discharge Encounter   Greater than 30 minutes including physician time.  Signed, Reino Bellis NP-C 02/26/2020, 1:10 PM   I have personally seen and examined this patient. I agree with the assessment and plan as outlined above.  He is feeling well this am. No chest pain. BP stable. Will d/c home today on current therapy as above.   Lauree Chandler 02/26/2020 1:10 PM

## 2020-02-27 ENCOUNTER — Other Ambulatory Visit: Payer: Self-pay | Admitting: *Deleted

## 2020-02-27 DIAGNOSIS — I714 Abdominal aortic aneurysm, without rupture, unspecified: Secondary | ICD-10-CM

## 2020-03-06 ENCOUNTER — Other Ambulatory Visit: Payer: Self-pay | Admitting: *Deleted

## 2020-03-06 ENCOUNTER — Encounter: Payer: Self-pay | Admitting: *Deleted

## 2020-03-06 NOTE — Patient Outreach (Signed)
Pt home after MI and triple angioplasty. He is getting home health. Family members are in attendance. Pt is married but Mrs. Vinas has dementia.  Advised that pt had questions reported on the [facility] Discharge call. Daughter believes the question was related to medications. Pt has talked with his primary care provider about this and his questions were answered.  I will send our care management information and be available if they would like me to engage after he is discharged from home health services.  Eulah Pont. Myrtie Neither, MSN, [facility] Gerontological Nurse Practitioner [facility] Care Management [phone]

## 2020-03-09 ENCOUNTER — Ambulatory Visit: Payer: Medicare Other | Admitting: Cardiology

## 2020-03-09 ENCOUNTER — Encounter: Payer: Self-pay | Admitting: Family Medicine

## 2020-03-10 ENCOUNTER — Ambulatory Visit: Payer: Medicare Other | Admitting: Family Medicine

## 2020-03-10 DIAGNOSIS — E1142 Type 2 diabetes mellitus with diabetic polyneuropathy: Secondary | ICD-10-CM | POA: Diagnosis not present

## 2020-03-10 DIAGNOSIS — E785 Hyperlipidemia, unspecified: Secondary | ICD-10-CM | POA: Diagnosis not present

## 2020-03-10 DIAGNOSIS — Z23 Encounter for immunization: Secondary | ICD-10-CM | POA: Diagnosis not present

## 2020-03-11 LAB — BASIC METABOLIC PANEL
BUN/Creatinine Ratio: 9 — ABNORMAL LOW (ref 10–24)
BUN: 12 mg/dL (ref 8–27)
CO2: 22 mmol/L (ref 20–29)
Calcium: 9.5 mg/dL (ref 8.6–10.2)
Chloride: 102 mmol/L (ref 96–106)
Creatinine, Ser: 1.36 mg/dL — ABNORMAL HIGH (ref 0.76–1.27)
GFR calc Af Amer: 58 mL/min/{1.73_m2} — ABNORMAL LOW (ref >59)
GFR calc non Af Amer: 50 mL/min/{1.73_m2} — ABNORMAL LOW (ref >59)
Glucose: 133 mg/dL — ABNORMAL HIGH (ref 65–99)
Potassium: 4.5 mmol/L (ref 3.5–5.2)
Sodium: 140 mmol/L (ref 134–144)

## 2020-03-11 LAB — HEMOGLOBIN A1C
Est. average glucose Bld gHb Est-mCnc: 157 mg/dL
Hgb A1c MFr Bld: 7.1 % — ABNORMAL HIGH (ref 4.8–5.6)

## 2020-03-11 LAB — LIPID PANEL
Chol/HDL Ratio: 3.2 ratio (ref 0.0–5.0)
Cholesterol, Total: 119 mg/dL (ref 100–199)
HDL: 37 mg/dL — ABNORMAL LOW (ref >39)
LDL Chol Calc (NIH): 65 mg/dL (ref 0–99)
Triglycerides: 88 mg/dL (ref 0–149)
VLDL Cholesterol Cal: 17 mg/dL (ref 5–40)

## 2020-03-12 ENCOUNTER — Other Ambulatory Visit: Payer: Self-pay

## 2020-03-12 ENCOUNTER — Encounter: Payer: Self-pay | Admitting: Family Medicine

## 2020-03-12 ENCOUNTER — Ambulatory Visit (INDEPENDENT_AMBULATORY_CARE_PROVIDER_SITE_OTHER): Payer: Medicare Other | Admitting: Family Medicine

## 2020-03-12 ENCOUNTER — Telehealth: Payer: Self-pay | Admitting: *Deleted

## 2020-03-12 VITALS — BP 138/74 | Temp 97.6°F | Wt 184.4 lb

## 2020-03-12 DIAGNOSIS — E1142 Type 2 diabetes mellitus with diabetic polyneuropathy: Secondary | ICD-10-CM | POA: Diagnosis not present

## 2020-03-12 DIAGNOSIS — N289 Disorder of kidney and ureter, unspecified: Secondary | ICD-10-CM | POA: Diagnosis not present

## 2020-03-12 DIAGNOSIS — Z79899 Other long term (current) drug therapy: Secondary | ICD-10-CM | POA: Diagnosis not present

## 2020-03-12 DIAGNOSIS — E785 Hyperlipidemia, unspecified: Secondary | ICD-10-CM | POA: Diagnosis not present

## 2020-03-12 MED ORDER — HYDROCODONE-ACETAMINOPHEN 10-325 MG PO TABS: ORAL_TABLET | ORAL | 0 refills | Status: DC

## 2020-03-12 MED ORDER — ROSUVASTATIN CALCIUM 40 MG PO TABS: 40.0000 mg | ORAL_TABLET | Freq: Every day | ORAL | 3 refills | Status: DC

## 2020-03-12 MED ORDER — HYDROCODONE-ACETAMINOPHEN 10-325 MG PO TABS: 1.0000 | ORAL_TABLET | Freq: Three times a day (TID) | ORAL | 0 refills | Status: DC | PRN

## 2020-03-12 NOTE — Telephone Encounter (Signed)
Having a tooth and needs to know when to stop blood thinner.

## 2020-03-12 NOTE — Patient Instructions (Addendum)

## 2020-03-12 NOTE — Progress Notes (Signed)
Subjective:    Patient ID: Peter [name], male    DOB: [birth year], 79 y.o.   MRN: [id number]  Hypertension This is a chronic problem. Pertinent negatives include no chest pain, headaches or shortness of breath. Risk factors for coronary artery disease include diabetes mellitus and male gender. There are no compliance problems.   Diabetes He presents for his follow-up diabetic visit. He has type 2 diabetes mellitus. There are no hypoglycemic associated symptoms. Pertinent negatives for hypoglycemia include no confusion, dizziness or headaches. Pertinent negatives for diabetes include no chest pain and no fatigue. Current diabetic treatments: checks sugar twice a day. He does not see a podiatrist.Eye exam is current.  Notes from the hospital reviewed catheterization lab reports etc. Pt was in hospital back in March for heart attack. Pt states he is doing better.  His medications were reviewed in detail 22nd of this month pt will have aortic ultrasound at [facility]  Constitutional: Negative for diaphoresis and fatigue.  HENT: Negative for congestion and rhinorrhea.   Respiratory: Negative for cough and shortness of breath.   Cardiovascular: Negative for chest pain and leg swelling.  Gastrointestinal: Negative for abdominal pain and diarrhea.  Skin: Negative for color change and rash.  Neurological: Negative for dizziness and headaches.  Psychiatric/Behavioral: Negative for behavioral problems and confusion.       Objective:   Physical Exam Vitals reviewed.  Constitutional:      General: He is not in acute distress. HENT:     Head: Normocephalic and atraumatic.  Eyes:     General:        Right eye: No discharge.        Left eye: No discharge.  Neck:     Trachea: No tracheal deviation.  Cardiovascular:     Rate and Rhythm: Normal rate and regular rhythm.     Heart sounds: Normal heart sounds. No murmur.  Pulmonary:     Effort: Pulmonary effort is normal. No  respiratory distress.     Breath sounds: Normal breath sounds.  Lymphadenopathy:     Cervical: No cervical adenopathy.  Skin:    General: Skin is warm and dry.  Neurological:     Mental Status: He is alert.     Coordination: Coordination normal.  Psychiatric:        Behavior: Behavior normal.     Fall Risk  10/27/2019 09/11/2019 06/27/2017 06/01/2015 04/09/2015  Falls in the past year? 0 0 No No No  Comment - - - - -  Risk for fall due to : - - - - -  Risk for fall due to: Comment - - - - -  Follow up Falls evaluation completed Falls evaluation completed - - -         Assessment & Plan:  1. Type 2 diabetes mellitus with peripheral neuropathy (HCC) His diabetes under decent control have encouraged him to improve his activity level and improve dietary selections recheck A1c in several months if it is still up consider low-dose Metformin - Hemoglobin A1c  2. Renal insufficiency Renal insufficiency watch salt in the diet stay active continue medications keep blood pressure under control recheck lab work in 3 months - Basic metabolic panel  3. Dyslipidemia Cholesterol under good control but not tolerating Lipitor because of stomach pains switch to Crestor check lipid panel in 3 months - Lipid panel  4. High risk medication use Liver functions ordered - Hepatic function panel It should be noted  that 30 minutes was spent with patient discussing his medical issues plus also examining him plus also documenting and reviewing labs discharge summary etc.

## 2020-03-15 NOTE — Telephone Encounter (Signed)
We will touch base with his cardiology

## 2020-03-17 ENCOUNTER — Telehealth: Payer: Self-pay | Admitting: *Deleted

## 2020-03-17 NOTE — Telephone Encounter (Signed)
This is a patient of Dr. Harl Bowie.  I reviewed the chart.  He just recently had a DES intervention in March, so he cannot stop aspirin or Brilinta at this time.  Dentist will have to discuss with the patient whether proceeding with extraction is safe on dual antiplatelet therapy or not.  Otherwise, it will have to be deferred.  I will forward this to Dr. Harl Bowie so that he is aware in case he has further input.

## 2020-03-17 NOTE — Telephone Encounter (Signed)
Nurses I sent a message to cardiology did not hear anything back  Please contact cardiology with a phone call.  Let them know the question regarding if he is able to stop his Brilinta before having a tooth pulled Patient recently had angiography and stent I would prefer to cardiology handle this issue and call the patient please Please document that this was passed off to cardiology and we will let them handle this delicate question

## 2020-03-17 NOTE — Telephone Encounter (Signed)
Received a call from Autumn with Dr. Lance Sell office. She states that the pt is have a tooth pulled and would like to know how long to stop Brilinta. Pt Recently had angiography and stent and Dr. Wolfgang Phoenix rather cardiology handle this. Please advise.

## 2020-03-17 NOTE — Telephone Encounter (Signed)
Spoke with Varney Biles at [facility] in [place] and she stated she will sent a note back to the provider and they will contact the patient with the recommendations.

## 2020-03-19 ENCOUNTER — Telehealth: Payer: Self-pay | Admitting: *Deleted

## 2020-03-19 ENCOUNTER — Other Ambulatory Visit: Payer: Self-pay | Admitting: *Deleted

## 2020-03-19 MED ORDER — DOXYCYCLINE HYCLATE 100 MG PO TABS: ORAL_TABLET | ORAL | 0 refills | Status: DC

## 2020-03-19 NOTE — Telephone Encounter (Signed)
Patient has a tick bite

## 2020-03-19 NOTE — Telephone Encounter (Signed)
Patient had tick bite Pulled off today Could been present for more than a day He is concerned about infection cannot come to the office because he is caring for his sick wife Doxycycline 100 mg take 2 today follow-up if any ongoing troubles this does a good job of preventing systemic illness warning signs were discussed this was sent into his pharmacy patient aware

## 2020-03-20 ENCOUNTER — Other Ambulatory Visit: Payer: Self-pay

## 2020-03-20 NOTE — Telephone Encounter (Signed)
Pt states he will run out of medications before his next visit.  Pt would like assistance to afford medications.   Please call [phone]   Thanks renee

## 2020-03-23 ENCOUNTER — Other Ambulatory Visit: Payer: Self-pay

## 2020-03-23 MED ORDER — LOSARTAN POTASSIUM 25 MG PO TABS: 25.0000 mg | ORAL_TABLET | Freq: Every day | ORAL | 6 refills | Status: DC

## 2020-03-23 NOTE — Telephone Encounter (Signed)
Pt given Brilinta 90 mg samples and Patient assistance form for [employer]

## 2020-03-23 NOTE — Telephone Encounter (Signed)
Pt got a month's worth of samples. Also got an assistance application for Brilinta.

## 2020-03-26 ENCOUNTER — Ambulatory Visit (HOSPITAL_COMMUNITY)
Admission: RE | Admit: 2020-03-26 | Discharge: 2020-03-26 | Disposition: A | Discharge status: Home / Self Care | Payer: Medicare Other | Source: Ambulatory Visit | Attending: Family Medicine | Admitting: Family Medicine

## 2020-03-26 ENCOUNTER — Other Ambulatory Visit: Payer: Self-pay

## 2020-03-26 ENCOUNTER — Telehealth: Payer: Self-pay | Admitting: Family Medicine

## 2020-03-26 DIAGNOSIS — I713 Abdominal aortic aneurysm, ruptured: Secondary | ICD-10-CM | POA: Diagnosis not present

## 2020-03-26 DIAGNOSIS — I714 Abdominal aortic aneurysm, without rupture: Secondary | ICD-10-CM | POA: Diagnosis not present

## 2020-03-26 NOTE — Telephone Encounter (Signed)
Stacy from [facility] contacted office to let us know that patient had follow up ultrasound of abdominal aorta. Marzetta Board states the area is enlarging and a vascular surgery consult is suggested. Results are in epic. Please advise. Thank you

## 2020-03-26 NOTE — Telephone Encounter (Signed)
Pt.notified

## 2020-03-27 ENCOUNTER — Other Ambulatory Visit: Payer: Self-pay | Admitting: *Deleted

## 2020-03-27 DIAGNOSIS — I714 Abdominal aortic aneurysm, without rupture, unspecified: Secondary | ICD-10-CM

## 2020-03-27 NOTE — Telephone Encounter (Signed)
Please see result note thank you move forward with referral thank you

## 2020-03-27 NOTE — Telephone Encounter (Signed)
Pt was informed of results earlier by Whitney.

## 2020-04-03 ENCOUNTER — Ambulatory Visit: Payer: Medicare Other | Admitting: Physician Assistant

## 2020-04-03 ENCOUNTER — Other Ambulatory Visit: Payer: Self-pay

## 2020-04-03 ENCOUNTER — Encounter: Payer: Self-pay | Admitting: Physician Assistant

## 2020-04-03 VITALS — BP 130/74 | HR 78 | Temp 98.4°F | Ht 72.0 in | Wt 181.0 lb

## 2020-04-03 DIAGNOSIS — I10 Essential (primary) hypertension: Secondary | ICD-10-CM

## 2020-04-03 DIAGNOSIS — E785 Hyperlipidemia, unspecified: Secondary | ICD-10-CM

## 2020-04-03 DIAGNOSIS — E1169 Type 2 diabetes mellitus with other specified complication: Secondary | ICD-10-CM

## 2020-04-03 DIAGNOSIS — I2510 Atherosclerotic heart disease of native coronary artery without angina pectoris: Secondary | ICD-10-CM | POA: Diagnosis not present

## 2020-04-03 NOTE — Patient Instructions (Signed)
Medication Instructions:  Your physician recommends that you continue on your current medications as directed. Please refer to the Current Medication list given to you today.  Take Losartan at night  Check blood pressure and heart rate when you feel bad I will talk with Dr. Harl Bowie about starting Zetia   *If you need a refill on your cardiac medications before your next appointment, please call your pharmacy*   Lab Work: NONE   If you have labs (blood work) drawn today and your tests are completely normal, you will receive your results only by: Marland Kitchen MyChart Message (if you have MyChart) OR . A paper copy in the mail If you have any lab test that is abnormal or we need to change your treatment, we will call you to review the results.   Testing/Procedures: NONE    Follow-Up: At [facility], you and your health needs are our priority.  As part of our continuing mission to provide you with exceptional heart care, we have created designated Provider Care Teams.  These Care Teams include your primary Cardiologist (physician) and Advanced Practice Providers (APPs -  Physician Assistants and Nurse Practitioners) who all work together to provide you with the care you need, when you need it.  We recommend signing up for the patient portal called "MyChart".  Sign up information is provided on this After Visit Summary.  MyChart is used to connect with patients for Virtual Visits (Telemedicine).  Patients are able to view lab/test results, encounter notes, upcoming appointments, etc.  Non-urgent messages can be sent to your provider as well.   To learn more about what you can do with MyChart, go to [email or web address].    Your next appointment:   3 month(s)  The format for your next appointment:   In Person  Provider:   Carlyle Dolly, MD   Other Instructions Thank you for choosing [facility]!

## 2020-04-03 NOTE — Progress Notes (Signed)
Cardiology Office Note   Date:  04/03/2020   ID:  Peter [name], DOB 08-Oct-1941, MRN [id number]  PCP:  Kathyrn Drown, MD Cardiologist:  Carlyle Dolly, MD 02/24/2020 in-hospital Electrphysiologist: None Rosaria Ferries, PA-C   No chief complaint on file.   History of Present Illness: Peter [name] is a 79 y.o. male with a history of CABG 1997, PCIs since, S-CHF, DM, HTN, HLD  Admitted 03/22-03/24/2021 for a non-STEMI, s/p DES SVG-Diag  Peter [name] presents for cardiology follow up.  He has not had chest pain since d/c.   However, he has had side effects from the meds. Feels very tired in the morning after am meds. Has also had problems w/ feeling cold, feels this is from the [place].   He spends a great deal of time every day caring for his wife.  She has dementia and is incontinent, so requires constant care.  He does not think he can do cardiac rehab because of the time spent caring for his wife.  However, he does have some help from family.  No lower extremity edema, no orthopnea or PND  No palpitations, no presyncope or syncope.    Past Medical History:  Diagnosis Date  . AAA (abdominal aortic aneurysm) ([place])   . CAD (coronary artery disease)    a. s/p CABG in 1997 b. stent to SVG-PDA and PTCA of LAD in 2003 c. cath in 2015 showing patent SVG-OM1-OM2, SVG-RI, SVG-D1, and LIMA-LAD with occluded SVG-PDA and severe stenosis of mid-LAD at LIMA insertion with DES placed d. 04/2018: similar results to 2015 with patent LAD stent; occlusion of small caliber D1 stenosis with medical management recommended.   . Chronic back pain 07/25/2018   Chronic lumbar pain uses hydrocodone intermittently  . Chronic systolic CHF (congestive heart failure) ([place])   . COPD (chronic obstructive pulmonary disease) ([place])   . Diabetes mellitus    diet controlled  . Dyslipidemia   . Hypertension   . Myocardial infarction [facility]) 1997   stents x2 (2003/2007)    Past Surgical  History:  Procedure Laterality Date  . CORONARY ANGIOPLASTY  2003/2007   2 stents  . CORONARY ANGIOPLASTY WITH STENT PLACEMENT  08/14/14   DES to LIMA-LAD insertion  . CORONARY ARTERY BYPASS GRAFT  1997   7 vessels  . CORONARY STENT INTERVENTION N/A 02/24/2020   Procedure: CORONARY STENT INTERVENTION;  Surgeon: Sherren Mocha, MD;  Location: [facility];  Service: Cardiovascular;  Laterality: N/A;  . CORONARY STENT PLACEMENT  08/15/2014   MID LAD  DES  by Dr Burt Knack  . CORONARY/GRAFT ANGIOGRAPHY N/A 02/24/2020   Procedure: CORONARY/GRAFT ANGIOGRAPHY;  Surgeon: Sherren Mocha, MD;  Location: [facility];  Service: Cardiovascular;  Laterality: N/A;  . CYSTECTOMY  2005   top of head-[place]  . EAR CYST EXCISION  07/09/2012   Procedure: CYST REMOVAL;  Surgeon: Jamesetta So, MD;  Location: [facility];  Service: General;  Laterality: N/A;  . LEFT HEART CATH AND CORS/GRAFTS ANGIOGRAPHY N/A 04/16/2018   Procedure: LEFT HEART CATH AND CORS/GRAFTS ANGIOGRAPHY;  Surgeon: Martinique, Peter M, MD;  Location: [facility];  Service: Cardiovascular;  Laterality: N/A;  . LEFT HEART CATHETERIZATION WITH CORONARY/GRAFT ANGIOGRAM N/A 08/14/2014   Procedure: LEFT HEART CATHETERIZATION WITH Beatrix Fetters;  Surgeon: Blane Ohara, MD;  Location: [facility] CATH LAB;  Service: Cardiovascular;  Laterality: N/A;    Current Outpatient Medications  Medication Sig Dispense Refill  . Aloe Vanita Ingles  Juice LIQD Take 8 oz by mouth daily.    Marland Kitchen aspirin EC 81 MG tablet Take 1 tablet (81 mg total) by mouth daily. 90 tablet 3  . HYDROcodone-acetaminophen (NORCO) 10-325 MG tablet Take 1 tablet by mouth 3 (three) times daily as needed. 60 tablet 0  . losartan (COZAAR) 25 MG tablet Take 1 tablet (25 mg total) by mouth daily. 30 tablet 6  . metoprolol succinate (TOPROL-XL) 25 MG 24 hr tablet Take 1 tablet (25 mg total) by mouth daily. 90 tablet 1  . nitroGLYCERIN (NITROSTAT) 0.4 MG SL tablet Place 1 tablet (0.4 mg  total) under the tongue every 5 (five) minutes as needed for chest pain. 30 tablet 3  . rosuvastatin (CRESTOR) 40 MG tablet Take 1 tablet (40 mg total) by mouth daily. 30 tablet 3  . ticagrelor (BRILINTA) 90 MG TABS tablet Take 1 tablet (90 mg total) by mouth 2 (two) times daily. 60 tablet 11   No current facility-administered medications for this visit.    Allergies:   Dye fdc red [red dye], Ivp dye [iodinated diagnostic agents], Plavix [clopidogrel], and Keflex [cephalexin]    Social History:  The patient  reports that he quit smoking about 19 years ago. His smoking use included cigarettes. He has a 120.00 pack-year smoking history. He has never used smokeless tobacco. He reports that he does not drink alcohol or use drugs.   Family History:  The patient's family history includes Heart attack in his mother; Hypertension in his father; Other in his father.  He indicated that his mother is deceased. He indicated that his father is deceased. He indicated that the status of his neg hx is unknown.   ROS:  Please see the history of present illness. All other systems are reviewed and negative.    PHYSICAL EXAM: VS:  BP 130/74   Pulse 78   Temp 98.4 F (36.9 C)   Ht 6' (1.829 m)   Wt 181 lb (82.1 kg)   SpO2 96%   BMI 24.55 kg/m  , BMI Body mass index is 24.55 kg/m. GEN: Well nourished, well developed, male in no acute distress HEENT: normal for age  Neck: no JVD, no carotid bruit, no masses Cardiac: RRR; no murmur, no rubs, or gallops Respiratory:  clear to auscultation bilaterally, normal work of breathing GI: soft, nontender, nondistended, + BS MS: no deformity or atrophy; no edema; distal pulses are 2+ in all 4 extremities  Skin: warm and dry, no rash Neuro:  Strength and sensation are intact Psych: euthymic mood, full affect   EKG:  EKG is not ordered today.  ECHO: 02/25/2020 1. Left ventricular ejection fraction, by estimation, is 40 to 45%. The  left ventricle has  mildly decreased function. The left ventricle  demonstrates regional wall motion abnormalities (see scoring  diagram/findings for description). There is mild  concentric left ventricular hypertrophy. Left ventricular diastolic  parameters are consistent with Grade I diastolic dysfunction (impaired  relaxation).  2. Right ventricular systolic function is normal. The right ventricular  size is normal. Tricuspid regurgitation signal is inadequate for assessing  PA pressure.  3. The mitral valve is grossly normal. No evidence of mitral valve  regurgitation. No evidence of mitral stenosis.  4. The aortic valve is tricuspid. Aortic valve regurgitation is trivial.  Mild aortic valve sclerosis is present, with no evidence of aortic valve  stenosis.  5. Aortic dilatation noted. 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  6.  The inferior vena cava is normal in size with greater than 50%  respiratory variability, suggesting right atrial pressure of 3 mmHg.   CATH: 02/24/2020 1. 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 2. S/P CABG with continued patency of the LIMA-LAD, sequential SVG-OM1 and OM2, and severe stenosis of the SVG-diagonal 3. Chronic occlusion of the SVG-PDA/PLA branches with left-to-right collaterals present 4. Successful SVG-PCI with stenting of the proximal and distal body of the SVG-diagonal, with transient no-reflow and severe small vessel distal disease.   Recommend aggressive medical therapy, minimum 12 months of ASA/ticagrelor consider long-term if tolerated Intervention     Recent Labs: 02/24/2020: ALT 31; B Natriuretic Peptide 220.0 02/26/2020: Hemoglobin 14.0; Platelets 133 03/10/2020: BUN 12; Creatinine, Ser 1.36; Potassium 4.5; Sodium 140  CBC    Component Value Date/Time   WBC 13.1 (H) 02/26/2020 0405   RBC 4.24 02/26/2020 0405   HGB 14.0 02/26/2020 0405   HGB 14.8 03/07/2016 0924   HCT 40.3  02/26/2020 0405   HCT 42.7 03/07/2016 0924   PLT 133 (L) 02/26/2020 0405   PLT 127 (L) 03/07/2016 0924   MCV 95.0 02/26/2020 0405   MCV 97 03/07/2016 0924   MCH 33.0 02/26/2020 0405   MCHC 34.7 02/26/2020 0405   RDW 13.4 02/26/2020 0405   RDW 12.4 03/07/2016 0924   LYMPHSABS 2.7 02/24/2020 0625   LYMPHSABS 2.5 03/07/2016 0924   MONOABS 0.7 02/24/2020 0625   EOSABS 0.3 02/24/2020 0625   EOSABS 0.1 03/07/2016 0924   BASOSABS 0.1 02/24/2020 0625   BASOSABS 0.0 03/07/2016 0924   CMP Latest Ref Rng & Units 03/10/2020 02/25/2020 02/24/2020  Glucose 65 - 99 mg/dL 133(H) 201(H) -  BUN 8 - 27 mg/dL 12 22 -  Creatinine 0.76 - 1.27 mg/dL 1.36(H) 1.34(H) 1.12  Sodium 134 - 144 mmol/L 140 135 -  Potassium 3.5 - 5.2 mmol/L 4.5 4.2 -  Chloride 96 - 106 mmol/L 102 100 -  CO2 20 - 29 mmol/L 22 21(L) -  Calcium 8.6 - 10.2 mg/dL 9.5 9.4 -  Total Protein 6.5 - 8.1 g/dL - - -  Total Bilirubin 0.3 - 1.2 mg/dL - - -  Alkaline Phos 38 - 126 U/L - - -  AST 15 - 41 U/L - - -  ALT 0 - 44 U/L - - -     Lipid Panel Lab Results  Component Value Date   CHOL 119 03/10/2020   HDL 37 (L) 03/10/2020   LDLCALC 65 03/10/2020   TRIG 88 03/10/2020   CHOLHDL 3.2 03/10/2020      Wt Readings from Last 3 Encounters:  04/03/20 181 lb (82.1 kg)  03/12/20 184 lb 6.4 oz (83.6 kg)  02/26/20 184 lb 1.4 oz (83.5 kg)     Other studies Reviewed: Additional studies/ records that were reviewed today include: Office notes, hospital records and testing.  ASSESSMENT AND PLAN:  1.  CAD: -He is doing well post PCI -Continue aspirin, beta-blocker, Brilinta and statin -He has a possible allergy to Plavix with hives, but the notes in the chart state that could have been a dye reaction -I requested that he try to take the Brilinta for 3 months, he says he is able to continue it. -If the side of the effects of the Brilinta become too much, he is to contact us -He is encouraged to try to find some assistance with caring  for his wife so that he may do cardiac  rehab.  2.  Hyperlipidemia: -Goal LDL is less than 70, his was 65 when last checked  3.  Hypertension:  -continue losartan and Toprol-XL at current doses, but take the losartan in the evening to see if that minimizes the fatigue he is getting during the day -He is also requested to take his blood pressure and heart rate when he feels bad, and let us know if the values are abnormal   Current medicines are reviewed at length with the patient today.  The patient has concerns regarding medicines.  Concerns were addressed  The following changes have been made: Change the timing of the losartan, try to take the Brilinta for 3 months and then discuss with Dr. Harl Bowie  Labs/ tests ordered today include:  No orders of the defined types were placed in this encounter.    Disposition:   FU with Carlyle Dolly, MD  Signed, Rosaria Ferries, PA-C  04/03/2020 3:43 PM    [place] Phone: [phone]; Fax: [phone]

## 2020-04-07 ENCOUNTER — Other Ambulatory Visit: Payer: Self-pay

## 2020-04-07 ENCOUNTER — Ambulatory Visit (INDEPENDENT_AMBULATORY_CARE_PROVIDER_SITE_OTHER): Payer: Medicare Other | Admitting: Physician Assistant

## 2020-04-07 VITALS — BP 182/90 | HR 70 | Temp 97.0°F | Resp 16 | Ht 72.0 in | Wt 178.0 lb

## 2020-04-07 DIAGNOSIS — I714 Abdominal aortic aneurysm, without rupture, unspecified: Secondary | ICD-10-CM

## 2020-04-07 DIAGNOSIS — I713 Abdominal aortic aneurysm, ruptured, unspecified: Secondary | ICD-10-CM

## 2020-04-07 DIAGNOSIS — Z91041 Radiographic dye allergy status: Secondary | ICD-10-CM | POA: Diagnosis not present

## 2020-04-07 MED ORDER — PREDNISONE 50 MG PO TABS: 50.0000 mg | ORAL_TABLET | Freq: Three times a day (TID) | ORAL | 0 refills | Status: DC

## 2020-04-07 MED ORDER — DIPHENHYDRAMINE HCL 12.5 MG/5ML PO SYRP: 50.0000 mg | ORAL_SOLUTION | Freq: Once | ORAL | 0 refills | Status: DC

## 2020-04-07 NOTE — Progress Notes (Signed)
Office Note     CC:  follow up Requesting Provider:  Kathyrn Drown, MD  HPI: Peter [name] is a 79 y.o. ([birth year]) male who presents for follow-up of infrarenal abdominal aortic aneurysm.  He was last evaluated in December with ultrasound and recently underwent surveillance aortic ultrasound at [facility].  He is accompanied in the office today by his son.  The patient states he had a heart attack several weeks ago and required DES intervention of his previous bypasses.  He states he has been doing well at home since discharge.  He takes care of his wife who has significant dementia and requires his help with her ADLs and hygiene.  PMH significant for NSTEMI, PCI with DES to LIMA and LAD in 2015.  He is intolerant of Plavix (skin rash) and is maintained on Brilinta and asa 81mg  and Crestor.  He is diabetic but states he does not take medication.  Has a history of COPD after smoking approximately 3 packs/day for 40 years. He quit smoking in 1998.  Past Medical History:  Diagnosis Date  . AAA (abdominal aortic aneurysm) ([place])   . CAD (coronary artery disease)    a. s/p CABG in 1997 b. stent to SVG-PDA and PTCA of LAD in 2003 c. cath in 2015 showing patent SVG-OM1-OM2, SVG-RI, SVG-D1, and LIMA-LAD with occluded SVG-PDA and severe stenosis of mid-LAD at LIMA insertion with DES placed d. 04/2018: similar results to 2015 with patent LAD stent; occlusion of small caliber D1 stenosis with medical management recommended.   . Chronic back pain 07/25/2018   Chronic lumbar pain uses hydrocodone intermittently  . Chronic systolic CHF (congestive heart failure) ([place])   . COPD (chronic obstructive pulmonary disease) ([place])   . Diabetes mellitus    diet controlled  . Dyslipidemia   . Hypertension   . Myocardial infarction [facility]) 1997   stents x2 (2003/2007)    Past Surgical History:  Procedure Laterality Date  . CORONARY ANGIOPLASTY  2003/2007   2 stents  . CORONARY ANGIOPLASTY WITH STENT  PLACEMENT  08/14/14   DES to LIMA-LAD insertion  . CORONARY ARTERY BYPASS GRAFT  1997   7 vessels  . CORONARY STENT INTERVENTION N/A 02/24/2020   Procedure: CORONARY STENT INTERVENTION;  Surgeon: Sherren Mocha, MD;  Location: [place] [facility];  Service: Cardiovascular;  Laterality: N/A;  . CORONARY STENT PLACEMENT  08/15/2014   MID LAD  DES  by Dr Burt Knack  . CORONARY/GRAFT ANGIOGRAPHY N/A 02/24/2020   Procedure: CORONARY/GRAFT ANGIOGRAPHY;  Surgeon: Sherren Mocha, MD;  Location: [place] [facility];  Service: Cardiovascular;  Laterality: N/A;  . CYSTECTOMY  2005   top of head-Jenkins  . EAR CYST EXCISION  07/09/2012   Procedure: CYST REMOVAL;  Surgeon: Jamesetta So, MD;  Location: [facility];  Service: General;  Laterality: N/A;  . LEFT HEART CATH AND CORS/GRAFTS ANGIOGRAPHY N/A 04/16/2018   Procedure: LEFT HEART CATH AND CORS/GRAFTS ANGIOGRAPHY;  Surgeon: Martinique, Peter M, MD;  Location: [place] [facility];  Service: Cardiovascular;  Laterality: N/A;  . LEFT HEART CATHETERIZATION WITH CORONARY/GRAFT ANGIOGRAM N/A 08/14/2014   Procedure: LEFT HEART CATHETERIZATION WITH Beatrix Fetters;  Surgeon: Blane Ohara, MD;  Location: [facility] CATH LAB;  Service: Cardiovascular;  Laterality: N/A;    Social History   Socioeconomic History  . Marital status: Married    Spouse name: Not on file  . Number of children: Not on file  . Years of education: Not on file  .  Highest education level: Not on file  Occupational History  . Not on file  Tobacco Use  . Smoking status: Former Smoker    Packs/day: 3.00    Years: 40.00    Pack years: 120.00    Types: Cigarettes    Quit date: 07/02/2000    Years since quitting: 19.7  . Smokeless tobacco: Never Used  Substance and Sexual Activity  . Alcohol use: No    Alcohol/week: 10.0 standard drinks    Types: 10 Cans of beer per week    Comment: quit 3 months (01/2013). 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  . Drug use: No  .  Sexual activity: Not Currently    Birth control/protection: None  Other Topics Concern  . Not on file  Social History Narrative  . Not on file   Social Determinants of Health   Financial Resource Strain:   . Difficulty of Paying Living Expenses:   Food Insecurity:   . Worried About Charity fundraiser in the Last Year:   . Arboriculturist in the Last Year:   Transportation Needs:   . Film/video editor (Medical):   Marland Kitchen Lack of Transportation (Non-Medical):   Physical Activity:   . Days of Exercise per Week:   . Minutes of Exercise per Session:   Stress:   . Feeling of Stress :   Social Connections:   . Frequency of Communication with Friends and Family:   . Frequency of Social Gatherings with Friends and Family:   . Attends Religious Services:   . Active Member of Clubs or Organizations:   . Attends Archivist Meetings:   Marland Kitchen Marital Status:   Intimate Partner Violence:   . Fear of Current or Ex-Partner:   . Emotionally Abused:   Marland Kitchen Physically Abused:   . Sexually Abused:    Family History  Problem Relation Age of Onset  . Other Father        deceased after ?TCS or barium enema, age 33s  . Hypertension Father   . Heart attack Mother   . Colon cancer Neg Hx   . Liver disease Neg Hx     Current Outpatient Medications  Medication Sig Dispense Refill  . Aloe Vera Juice LIQD Take 8 oz by mouth daily.    Marland Kitchen aspirin EC 81 MG tablet Take 1 tablet (81 mg total) by mouth daily. 90 tablet 3  . HYDROcodone-acetaminophen (NORCO) 10-325 MG tablet Take 1 tablet by mouth 3 (three) times daily as needed. 60 tablet 0  . losartan (COZAAR) 25 MG tablet Take 1 tablet (25 mg total) by mouth daily. 30 tablet 6  . metoprolol succinate (TOPROL-XL) 25 MG 24 hr tablet Take 1 tablet (25 mg total) by mouth daily. 90 tablet 1  . nitroGLYCERIN (NITROSTAT) 0.4 MG SL tablet Place 1 tablet (0.4 mg total) under the tongue every 5 (five) minutes as needed for chest pain. 30 tablet 3  .  rosuvastatin (CRESTOR) 40 MG tablet Take 1 tablet (40 mg total) by mouth daily. 30 tablet 3  . ticagrelor (BRILINTA) 90 MG TABS tablet Take 1 tablet (90 mg total) by mouth 2 (two) times daily. 60 tablet 11   No current facility-administered medications for this visit.    Allergies  Allergen Reactions  . Dye Fdc Red [Red Dye] Hives    hives  . Ivp Dye [Iodinated Diagnostic Agents] Hives  . Plavix [Clopidogrel] Hives    Unsure if  dye or Plavix  . Keflex [Cephalexin] Other (See Comments)    hoarsness     REVIEW OF SYSTEMS:   [X]  denotes positive finding, [ ]  denotes negative finding Cardiac  Comments:  Chest pain or chest pressure:    Shortness of breath upon exertion:    Short of breath when lying flat:    Irregular heart rhythm:        Vascular    Pain in calf, thigh, or hip brought on by ambulation:    Pain in feet at night that wakes you up from your sleep:     Blood clot in your veins:    Leg swelling:         Pulmonary    Oxygen at home:    Productive cough:     Wheezing:         Neurologic    Sudden weakness in arms or legs:     Sudden numbness in arms or legs:     Sudden onset of difficulty speaking or slurred speech:    Temporary loss of vision in one eye:     Problems with dizziness:         Gastrointestinal    Blood in stool:     Vomited blood:         Genitourinary    Burning when urinating:     Blood in urine:        Psychiatric    Major depression:         Hematologic    Bleeding problems:    Problems with blood clotting too easily:        Skin    Rashes or ulcers:        Constitutional    Fever or chills:      PHYSICAL EXAMINATION:  Vitals:   04/07/20 1319  Weight: 178 lb (80.7 kg)  Height: 6' (1.829 m)   General:  WDWN in NAD; vital signs documented above Gait: slow, but steady. No ataxia HENT: WNL, normocephalic Pulmonary: normal non-labored breathing , without Rales, rhonchi,  wheezing Cardiac: regular HR, without  Murmurs  without carotid bruit Abdomen: soft, NT, no pulsatile masses Skin: without rashes Extremities: without ischemic changes, without Gangrene , without cellulitis; without open wounds; Feet are warm; no edema Musculoskeletal: no muscle wasting or atrophy  Neurologic: A&O X 3;  No focal weakness or paresthesias are detected Psychiatric:  The pt has Normal affect.  Non-Invasive Vascular Imaging:  03/26/2020 IMPRESSION: Enlarging abdominal aortic aneurysm with maximum diameter of mid abdominal aorta at 5.2 cm on today's exam. Bilateral common iliac artery aneurysms. Vascular surgery consultation is suggested.  ASSESSMENT/PLAN:: 79 y.o. male here for follow up for enlarging AAA.  Follow-up ultrasound imaging reveals change in maximal aortic diameter from 4.7 in December 2020 to 5.2 on latest ultrasound.  We discussed proceeding with CTA of the abdomen/pelvis and follow-up with Dr. Donzetta Matters.  He resides in [place] and would like to have the study done at [facility].  He is aware he needs to seek immediate medical attention for onset of chest pain, abdominal pain or sudden back pain.   Barbie Banner, PA-C [facility] [phone]  Clinic MD: Carlis Abbott

## 2020-04-07 NOTE — Addendum Note (Signed)
Addended by: Barbie Banner on: 04/07/2020 04:01 PM   Modules accepted: Orders

## 2020-04-07 NOTE — Progress Notes (Signed)
Prep for allergy to IVP for CTA Roxy Horseman PA-C

## 2020-04-09 ENCOUNTER — Other Ambulatory Visit: Payer: Self-pay

## 2020-04-09 ENCOUNTER — Ambulatory Visit (HOSPITAL_COMMUNITY)
Admission: RE | Admit: 2020-04-09 | Discharge: 2020-04-09 | Disposition: A | Discharge status: Home / Self Care | Payer: Medicare Other | Source: Ambulatory Visit | Attending: Vascular Surgery | Admitting: Vascular Surgery

## 2020-04-09 DIAGNOSIS — I713 Abdominal aortic aneurysm, ruptured, unspecified: Secondary | ICD-10-CM

## 2020-04-09 DIAGNOSIS — I714 Abdominal aortic aneurysm, without rupture: Secondary | ICD-10-CM | POA: Diagnosis not present

## 2020-04-09 MED ORDER — IOHEXOL 350 MG/ML SOLN
100.0000 mL | Freq: Once | INTRAVENOUS | Status: AC | PRN
  Administered 2020-04-09: 12:00:00 100 mL via INTRAVENOUS

## 2020-04-17 ENCOUNTER — Ambulatory Visit (INDEPENDENT_AMBULATORY_CARE_PROVIDER_SITE_OTHER): Payer: Medicare Other | Admitting: Vascular Surgery

## 2020-04-17 ENCOUNTER — Telehealth: Payer: Self-pay | Admitting: *Deleted

## 2020-04-17 ENCOUNTER — Other Ambulatory Visit: Payer: Self-pay

## 2020-04-17 ENCOUNTER — Encounter: Payer: Self-pay | Admitting: Vascular Surgery

## 2020-04-17 VITALS — BP 154/94 | HR 60 | Temp 97.2°F | Resp 20 | Ht 72.0 in | Wt 179.0 lb

## 2020-04-17 DIAGNOSIS — I714 Abdominal aortic aneurysm, without rupture, unspecified: Secondary | ICD-10-CM

## 2020-04-17 NOTE — Telephone Encounter (Signed)
Patient notified that Brilinta assistance medication is available for pick up.

## 2020-04-17 NOTE — Progress Notes (Signed)
Patient ID: Peter [name], male   DOB: 03-29-1941, 79 y.o.   MRN: [id number]  Reason for Consult: Follow-up   Referred by Peter Drown, MD  Subjective:     HPI:  Peter [name] is a 79 y.o. male with known abdominal he has been followed for several years for his abdominal aortic aneurysm is noticed to increase in size to over 5 cm.  He has no new back or abdominal pain.  He presents today with his son.  Really does not have any complaints related to today's visit.  He does take aspirin and a statin drug.  Past Medical History:  Diagnosis Date  . AAA (abdominal aortic aneurysm) ([place])   . CAD (coronary artery disease)    a. s/p CABG in 1997 b. stent to SVG-PDA and PTCA of LAD in 2003 c. cath in 2015 showing patent SVG-OM1-OM2, SVG-RI, SVG-D1, and LIMA-LAD with occluded SVG-PDA and severe stenosis of mid-LAD at LIMA insertion with DES placed d. 04/2018: similar results to 2015 with patent LAD stent; occlusion of small caliber D1 stenosis with medical management recommended.   . Chronic back pain 07/25/2018   Chronic lumbar pain uses hydrocodone intermittently  . Chronic systolic CHF (congestive heart failure) ([place])   . COPD (chronic obstructive pulmonary disease) ([place])   . Diabetes mellitus    diet controlled  . Dyslipidemia   . Hypertension   . Myocardial infarction [facility]) 1997   stents x2 (2003/2007)   Family History  Problem Relation Age of Onset  . Other Father        deceased after ?TCS or barium enema, age 12s  . Hypertension Father   . Heart attack Mother   . Colon cancer Neg Hx   . Liver disease Neg Hx    Past Surgical History:  Procedure Laterality Date  . CORONARY ANGIOPLASTY  2003/2007   2 stents  . CORONARY ANGIOPLASTY WITH STENT PLACEMENT  08/14/14   DES to LIMA-LAD insertion  . CORONARY ARTERY BYPASS GRAFT  1997   7 vessels  . CORONARY STENT INTERVENTION N/A 02/24/2020   Procedure: CORONARY STENT INTERVENTION;  Surgeon: Sherren Mocha, MD;  Location: [place] [facility];  Service: Cardiovascular;  Laterality: N/A;  . CORONARY STENT PLACEMENT  08/15/2014   MID LAD  DES  by Dr Burt Knack  . CORONARY/GRAFT ANGIOGRAPHY N/A 02/24/2020   Procedure: CORONARY/GRAFT ANGIOGRAPHY;  Surgeon: Sherren Mocha, MD;  Location: [place] [facility];  Service: Cardiovascular;  Laterality: N/A;  . CYSTECTOMY  2005   top of head-Jenkins  . EAR CYST EXCISION  07/09/2012   Procedure: CYST REMOVAL;  Surgeon: Jamesetta So, MD;  Location: [facility];  Service: General;  Laterality: N/A;  . LEFT HEART CATH AND CORS/GRAFTS ANGIOGRAPHY N/A 04/16/2018   Procedure: LEFT HEART CATH AND CORS/GRAFTS ANGIOGRAPHY;  Surgeon: Martinique, Peter M, MD;  Location: [place] [facility];  Service: Cardiovascular;  Laterality: N/A;  . LEFT HEART CATHETERIZATION WITH CORONARY/GRAFT ANGIOGRAM N/A 08/14/2014   Procedure: LEFT HEART CATHETERIZATION WITH Beatrix Fetters;  Surgeon: Blane Ohara, MD;  Location: [facility] CATH LAB;  Service: Cardiovascular;  Laterality: N/A;    Short Social History:  Social History   Tobacco Use  . Smoking status: Former Smoker    Packs/day: 3.00    Years: 40.00    Pack years: 120.00    Types: Cigarettes    Quit date: 07/02/2000    Years since quitting: 19.8  . Smokeless tobacco: Never Used  Substance Use Topics  . Alcohol use: No    Alcohol/week: 10.0 standard drinks    Types: 10 Cans of beer per week    Comment: quit 3 months (01/2013). 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    Allergies  Allergen Reactions  . Dye Fdc Red [Red Dye] Hives    hives  . Ivp Dye [Iodinated Diagnostic Agents] Hives  . Plavix [Clopidogrel] Hives    Unsure if dye or Plavix  . Keflex [Cephalexin] Other (See Comments)    hoarsness    Current Outpatient Medications  Medication Sig Dispense Refill  . Aloe Vera Juice LIQD Take 8 oz by mouth daily.    Marland Kitchen aspirin EC 81 MG tablet Take 1 tablet (81 mg total) by mouth daily. 90 tablet 3  .  HYDROcodone-acetaminophen (NORCO) 10-325 MG tablet Take 1 tablet by mouth 3 (three) times daily as needed. 60 tablet 0  . losartan (COZAAR) 25 MG tablet Take 1 tablet (25 mg total) by mouth daily. 30 tablet 6  . metoprolol succinate (TOPROL-XL) 25 MG 24 hr tablet Take 1 tablet (25 mg total) by mouth daily. 90 tablet 1  . nitroGLYCERIN (NITROSTAT) 0.4 MG SL tablet Place 1 tablet (0.4 mg total) under the tongue every 5 (five) minutes as needed for chest pain. 30 tablet 3  . predniSONE (DELTASONE) 50 MG tablet Take 1 tablet (50 mg total) by mouth 3 (three) times daily. Take first tablet at 6pm Wednesday, 5/5; second tablet at 2am 5/6; and last tablet at 10am 5/6 (day of procedure) 3 tablet 0  . rosuvastatin (CRESTOR) 40 MG tablet Take 1 tablet (40 mg total) by mouth daily. 30 tablet 3  . ticagrelor (BRILINTA) 90 MG TABS tablet Take 1 tablet (90 mg total) by mouth 2 (two) times daily. 60 tablet 11  . diphenhydrAMINE (BENYLIN) 12.5 MG/5ML syrup Take 20 mLs (50 mg total) by mouth once for 1 dose. Take 20 mL at 10am 5/6 (day of procedure) 120 mL 0   No current facility-administered medications for this visit.    Review of Systems  Constitutional:  Constitutional negative. HENT: HENT negative.  Eyes: Eyes negative.  Respiratory: Respiratory negative.  Cardiovascular: Cardiovascular negative.  Musculoskeletal: Musculoskeletal negative.  Skin: Skin negative.  Neurological: Neurological negative. Hematologic: Hematologic/lymphatic negative.        Objective:  Objective   Vitals:   04/17/20 0929  BP: (!) 154/94  Pulse: 60  Resp: 20  Temp: (!) 97.2 F (36.2 C)  SpO2: 97%  Weight: 179 lb (81.2 kg)  Height: 6' (1.829 m)   Body mass index is 24.28 kg/m.  Physical Exam Constitutional:      Appearance: Normal appearance.  HENT:     Head: Normocephalic.     Nose:     Comments: Mask in place  Eyes:     Pupils: Pupils are equal, round, and reactive to light.  Cardiovascular:     Rate  and Rhythm: Normal rate and regular rhythm.  Pulmonary:     Effort: Pulmonary effort is normal.  Abdominal:     General: Abdomen is flat.     Palpations: Abdomen is soft. There is no mass.  Musculoskeletal:        General: No swelling. Normal range of motion.  Skin:    General: Skin is warm and dry.  Neurological:     General: No focal deficit present.     Mental Status: He is alert.  Psychiatric:  Mood and Affect: Mood normal.        Behavior: Behavior normal.        Thought Content: Thought content normal.     Data: CT IMPRESSION: Infrarenal abdominal aortic aneurysm, estimated 4.8 cm on the axial CT images. This is slightly increased from the prior of 4.3 cm. Aortic aneurysm NOS (ICD10-I71.9). Aortic Atherosclerosis (ICD10-I70.0).  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 Estimated no greater than 50% narrowing of the main left and main right renal arteries. Accessory right renal artery to the superior/posterior segment is small caliber and stenotic at the origin, contributing to superior/posterior segmental renal infarction.  Right common iliac artery ectasias of 1.8 cm.  Bilateral iliac arterial disease without evidence of high-grade stenosis or occlusion.  Additional ancillary findings as above.     Assessment/Plan:     79 year old male with 4.8 cm aneurysm by CT scan we reviewed the CT scan together today.  We discussed the signs and symptoms of rupture as well as the expected size criteria at which to repair.  He will follow-up in 6 months with repeat duplex understands that this may not agree with the CT scan today and we may ultimately have to get another CT scan in the future.  I have also discussed with his son who is present and also was a smoker in the past the need to have evaluation for abdominal aortic aneurysm from his primary care doctor.     Waynetta [place] MD [facility]

## 2020-04-21 ENCOUNTER — Other Ambulatory Visit: Payer: Self-pay | Admitting: *Deleted

## 2020-04-21 DIAGNOSIS — I714 Abdominal aortic aneurysm, without rupture, unspecified: Secondary | ICD-10-CM

## 2020-05-19 IMAGING — CT CT CTA ABD/PEL W/CM AND/OR W/O CM
2 of 7 series · 14 of 46 positions shown, 16 images · IV contrast (Omnipaque or Isovue)
Comparison: Ultrasound 03/26/2020, CT 10/25/2017

CLINICAL DATA: 79-year-old male with infrarenal abdominal aortic
aneurysm

EXAM:
CTA ABDOMEN AND PELVIS WITHOUT AND WITH CONTRAST
TECHNIQUE: Multidetector CT imaging of the abdomen and pelvis was performed
using the standard protocol during bolus administration of
intravenous contrast. Multiplanar reconstructed images and MIPs were
obtained and reviewed to evaluate the vascular anatomy.
CONTRAST:  100mL OMNIPAQUE IOHEXOL 350 MG/ML SOLN

[Series 4: arterial · axial · arterial · 0.66mm/px · z∈[+990,+1404]mm · 11 of 229 slices shown, 13 images]
[im 11/229  soft-tissue]
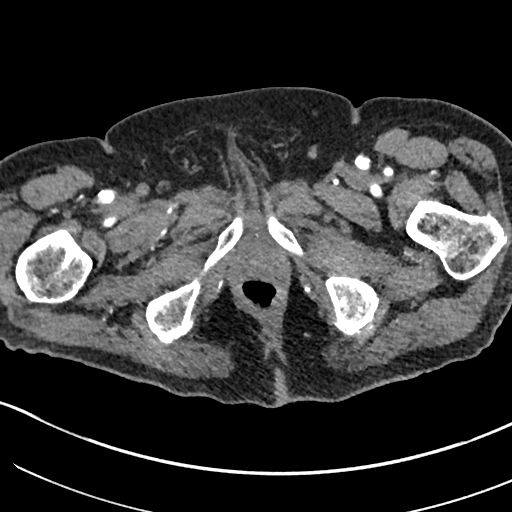
[im 11/229  bone]
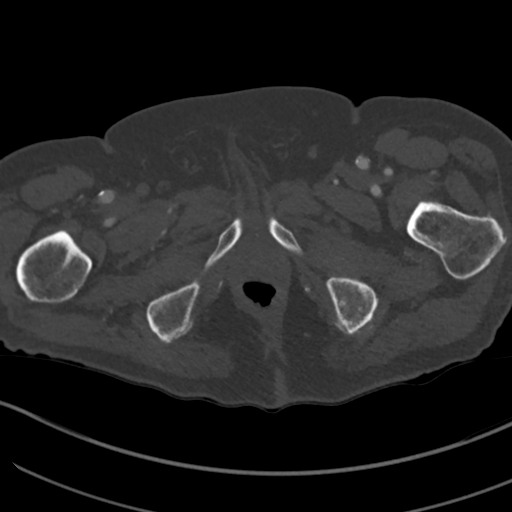
[im 33/229  soft-tissue]
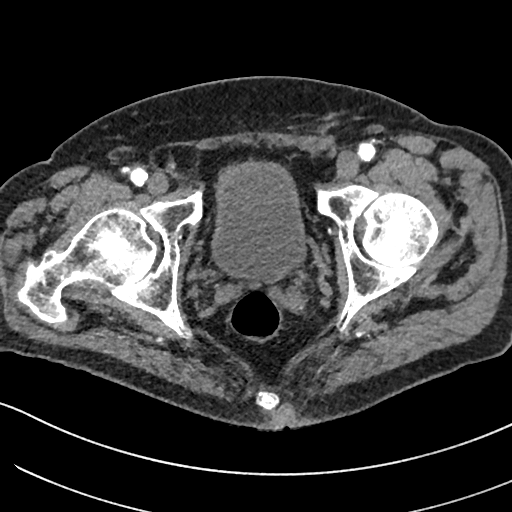
[im 55/229  soft-tissue]
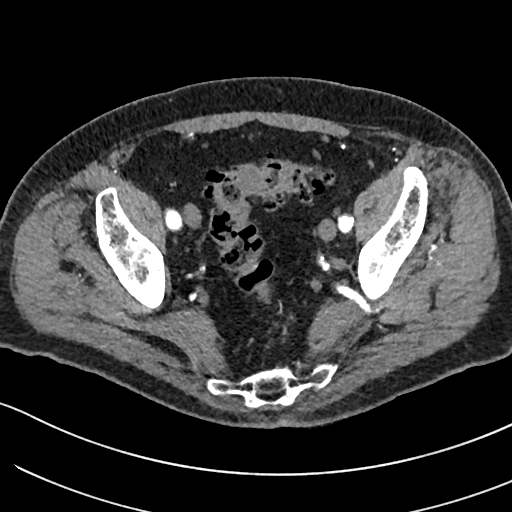
[im 77/229  soft-tissue]
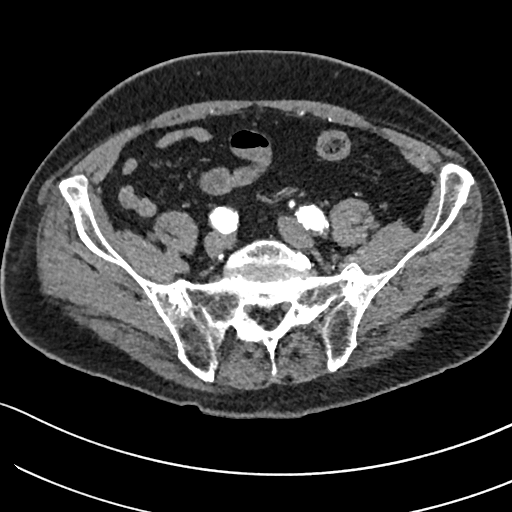
[im 98/229  soft-tissue]
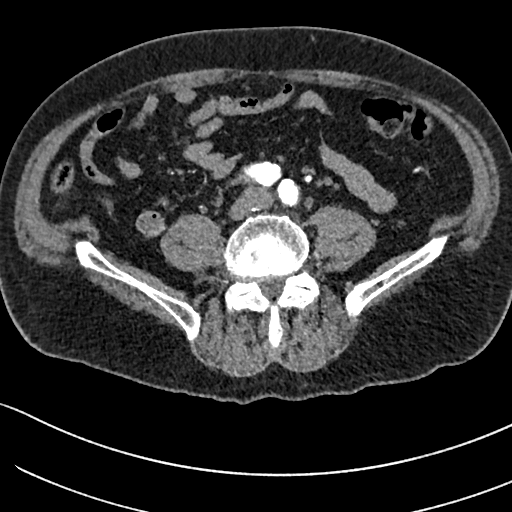
[im 120/229  soft-tissue]
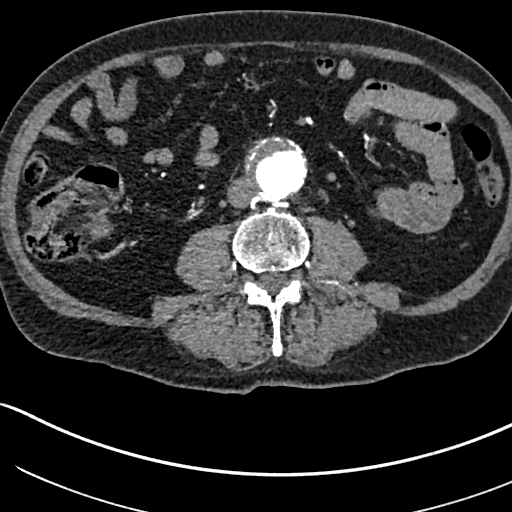
[im 131/229  soft-tissue]
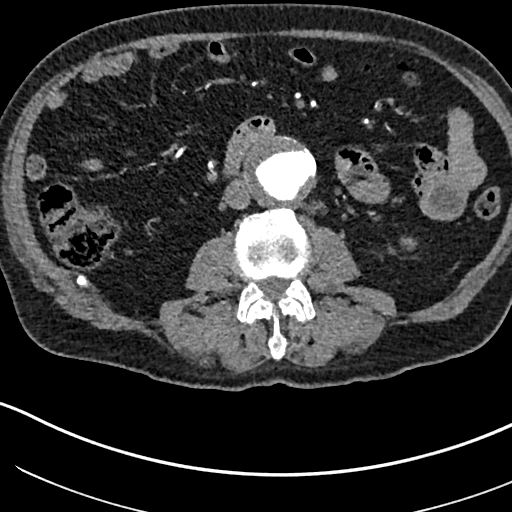
[im 153/229  soft-tissue]
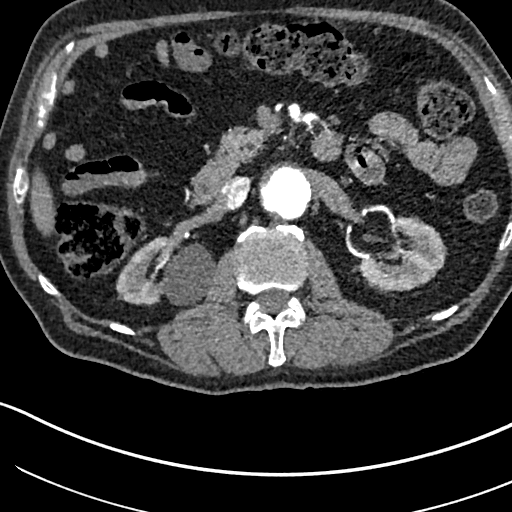
[im 174/229  soft-tissue]
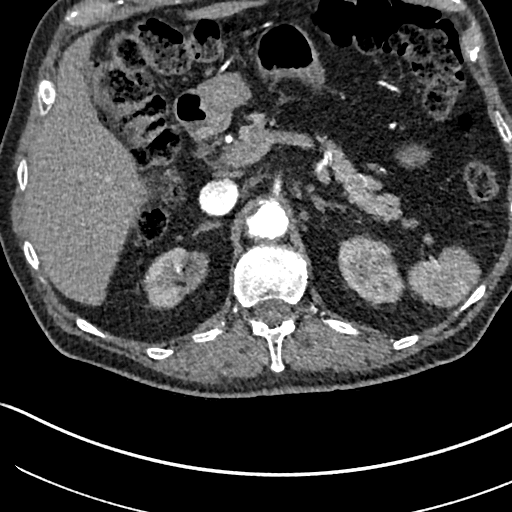
[im 174/229  bone]
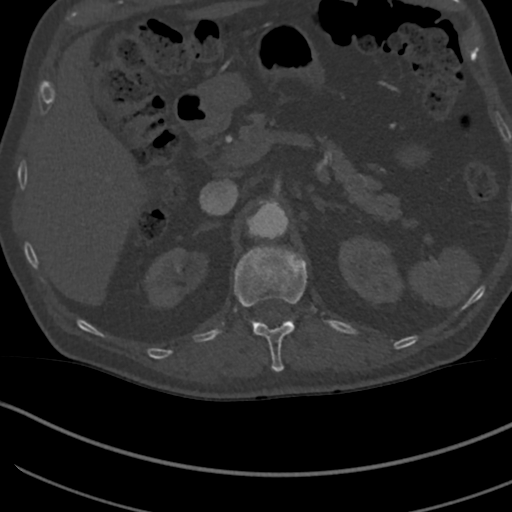
[im 196/229  soft-tissue]
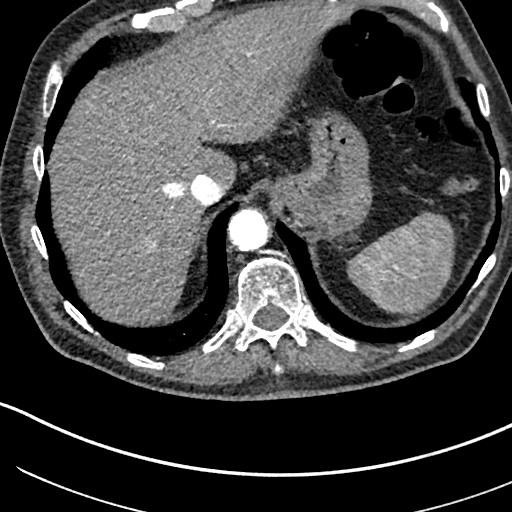
[im 218/229  soft-tissue]
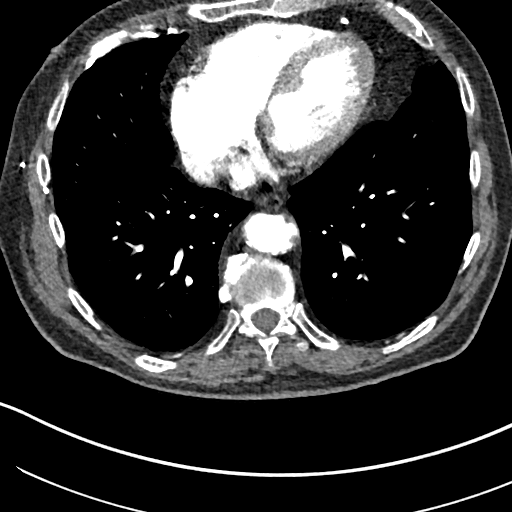

[Series 8: cor arterial · coronal · arterial · 0.85mm/px · 3 of 192 slices shown]
[im 48/192  soft-tissue]
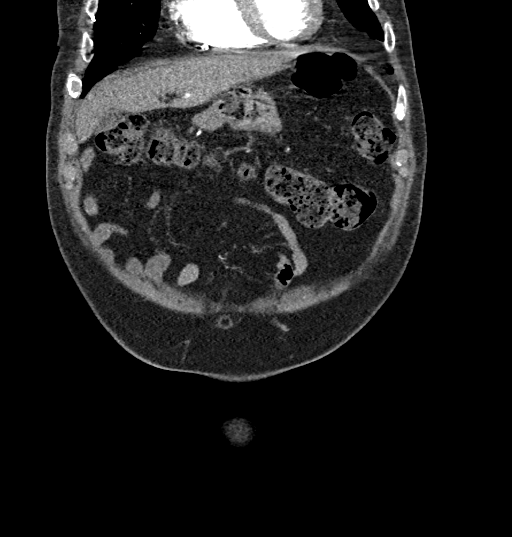
[im 96/192  soft-tissue]
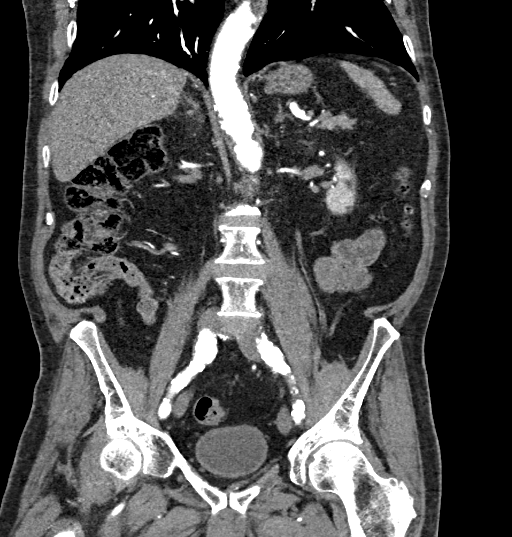
[im 144/192  soft-tissue]
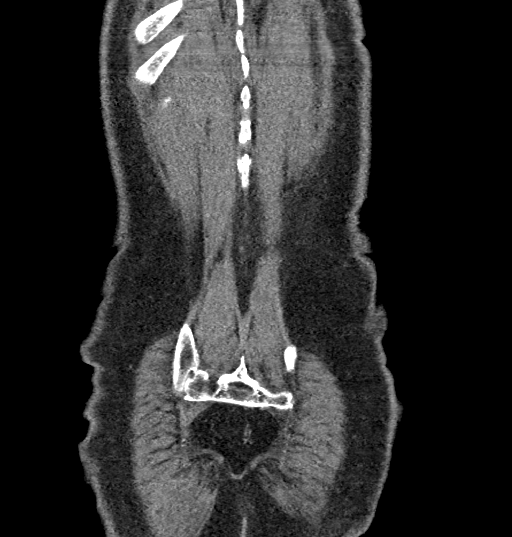

[14 of 46 positions shown; findings below may reference images not displayed]

FINDINGS: VASCULAR

Aorta: Atherosclerotic changes of the lower thoracic aorta. Diameter
at the hiatus measures 3.0 cm. Irregular intimal plaque throughout
the length of the abdominal aorta.

Greatest diameter of the infrarenal abdominal aorta is estimated
cm just above the IMA origin. Prior measurement was estimated
cm.

No periaortic fluid. No inflammatory changes. No dissection flap or
ulcerated plaque.

Celiac: Celiac artery patent with mild atherosclerosis. No
high-grade stenosis. Typical branch pattern of the celiac artery.

SMA: Atherosclerotic changes at the SMA origin without high-grade
stenosis or occlusion. Branches are patent.

Renals: . main right renal artery originates from the 11 o'clock
position of the aorta. No pre hilar branch is. Estimated 50%
narrowing at the origin of the right renal artery secondary to
atherosclerotic plaque.

There is an accessory right renal artery originating from the 9
o'clock position of the aorta to the superior/posterior segment. A
track appearance, potentially secondary to atherosclerotic changes
at the origin with decreased flow through the accessory renal artery
and associated decreased perfusion of the segment.

Main left renal artery with atherosclerotic changes at the origin,
and no evidence of high-grade stenosis. Main renal artery with no
pre hilar branches.

Accessory left renal artery from the 3 o'clock position with
atherosclerotic changes at the origin and estimated 50% narrowing on
the CT.

IMA: IMA remains patent.

Right lower extremity:

Atherosclerotic changes of the right common iliac artery. Diameter
measured in the mid segment 18 mm. Hypogastric artery remains patent
with atherosclerotic changes at the origin.

External iliac artery with mild atherosclerosis and no evidence of
high-grade stenosis or occlusion.

Common femoral artery with mild atherosclerosis. No significant
calcified plaque of the anterior wall.

Proximal profunda femoris and SFA patent.

Left lower extremity:

Atherosclerotic changes of the common iliac artery without
high-grade stenosis or occlusion.

Hypogastric artery is patent with at least 50% narrowing at the
origin secondary to atherosclerotic changes. External iliac artery
is patent with mild atherosclerosis. No high-grade stenosis or
occlusion.

Common femoral artery patent with mild atherosclerosis. Mild
anterior wall calcified plaque. Proximal profunda femoris and the
SFA patent.

Veins: Unremarkable appearance of the venous system.

Review of the MIP images confirms the above findings.

NON-VASCULAR

Lower chest: No acute.

Hepatobiliary: Uniform enhancement/attenuation of the liver on the
delayed phase, with only single small region hyperenhancement on the
arterial phase in the posterior right liver (image 33 of series 4).
Given the appearance on the delay, this is favored to be a benign
vascular phenomenon/defect. Unremarkable gall bladder.

Pancreas: Unremarkable.

Spleen: Unremarkable.

Adrenals/Urinary Tract: Unremarkable appearance of adrenal glands.

Right:

Right kidney is atrophic compared to the left, including cortical
infarction of the posterosuperior segment, likely related to the
atrophic accessory artery supplying the segment. Bosniak 1 cyst of
the medial cortex. No hydronephrosis. No nephrolithiasis.

Left:

Left kidney without hydronephrosis or nephrolithiasis. Uniform
perfusion.

Partially distended urinary bladder.

Stomach/Bowel: Unremarkable appearance of the stomach. Unremarkable
appearance of small bowel. No evidence of obstruction. Normal
appendix. Colonic diverticula. No focal inflammatory changes. Mild
stool burden. No obstruction.

Lymphatic: No adenopathy.

Mesenteric: No free fluid or air. No mesenteric adenopathy.

Reproductive: Calcifications of the prostate.

Other: Fat containing right inguinal hernia. Fat containing
umbilical hernia.

Musculoskeletal: Multilevel degenerative changes of the
thoracolumbar spine. No bony canal narrowing.
IMPRESSION: Infrarenal abdominal aortic aneurysm, estimated 4.8 cm on the axial
CT images. This is slightly increased from the prior of 4.3 cm.
Aortic aneurysm NOS (LREJN-492.0). Aortic Atherosclerosis
(LREJN-143.3).

There are 2 right renal arteries and 2 left renal arteries, with
associated renal arterial disease. Estimated no greater than 50%
narrowing of the main left and main right renal arteries. Accessory
right renal artery to the superior/posterior segment is small
caliber and stenotic at the origin, contributing to
superior/posterior segmental renal infarction.

Right common iliac artery ectasias of 1.8 cm.

Bilateral iliac arterial disease without evidence of high-grade
stenosis or occlusion.

Additional ancillary findings as above.

## 2020-06-11 ENCOUNTER — Ambulatory Visit: Payer: Medicare Other | Admitting: Family Medicine

## 2020-06-24 DIAGNOSIS — N289 Disorder of kidney and ureter, unspecified: Secondary | ICD-10-CM | POA: Diagnosis not present

## 2020-06-24 DIAGNOSIS — E785 Hyperlipidemia, unspecified: Secondary | ICD-10-CM | POA: Diagnosis not present

## 2020-06-24 DIAGNOSIS — E1142 Type 2 diabetes mellitus with diabetic polyneuropathy: Secondary | ICD-10-CM | POA: Diagnosis not present

## 2020-06-24 DIAGNOSIS — Z79899 Other long term (current) drug therapy: Secondary | ICD-10-CM | POA: Diagnosis not present

## 2020-06-25 LAB — HEMOGLOBIN A1C
Est. average glucose Bld gHb Est-mCnc: 157 mg/dL
Hgb A1c MFr Bld: 7.1 % — ABNORMAL HIGH (ref 4.8–5.6)

## 2020-06-25 LAB — BASIC METABOLIC PANEL
BUN/Creatinine Ratio: 16 (ref 10–24)
BUN: 20 mg/dL (ref 8–27)
CO2: 24 mmol/L (ref 20–29)
Calcium: 9.2 mg/dL (ref 8.6–10.2)
Chloride: 102 mmol/L (ref 96–106)
Creatinine, Ser: 1.28 mg/dL — ABNORMAL HIGH (ref 0.76–1.27)
GFR calc Af Amer: 62 mL/min/{1.73_m2} (ref >59)
GFR calc non Af Amer: 53 mL/min/{1.73_m2} — ABNORMAL LOW (ref >59)
Glucose: 110 mg/dL — ABNORMAL HIGH (ref 65–99)
Potassium: 4.2 mmol/L (ref 3.5–5.2)
Sodium: 139 mmol/L (ref 134–144)

## 2020-06-25 LAB — HEPATIC FUNCTION PANEL
ALT: 16 IU/L (ref 0–44)
AST: 21 IU/L (ref 0–40)
Albumin: 4.4 g/dL (ref 3.7–4.7)
Alkaline Phosphatase: 94 IU/L (ref 48–121)
Bilirubin Total: 0.7 mg/dL (ref 0.0–1.2)
Bilirubin, Direct: 0.22 mg/dL (ref 0.00–0.40)
Total Protein: 7.4 g/dL (ref 6.0–8.5)

## 2020-06-25 LAB — LIPID PANEL
Chol/HDL Ratio: 2.8 ratio (ref 0.0–5.0)
Cholesterol, Total: 107 mg/dL (ref 100–199)
HDL: 38 mg/dL — ABNORMAL LOW (ref >39)
LDL Chol Calc (NIH): 50 mg/dL (ref 0–99)
Triglycerides: 100 mg/dL (ref 0–149)
VLDL Cholesterol Cal: 19 mg/dL (ref 5–40)

## 2020-06-26 ENCOUNTER — Other Ambulatory Visit: Payer: Self-pay

## 2020-06-26 ENCOUNTER — Ambulatory Visit (INDEPENDENT_AMBULATORY_CARE_PROVIDER_SITE_OTHER): Payer: Medicare Other | Admitting: Family Medicine

## 2020-06-26 ENCOUNTER — Encounter: Payer: Self-pay | Admitting: Family Medicine

## 2020-06-26 VITALS — BP 118/82 | Temp 97.4°F | Wt 179.6 lb

## 2020-06-26 DIAGNOSIS — L821 Other seborrheic keratosis: Secondary | ICD-10-CM

## 2020-06-26 DIAGNOSIS — G8929 Other chronic pain: Secondary | ICD-10-CM

## 2020-06-26 DIAGNOSIS — E1142 Type 2 diabetes mellitus with diabetic polyneuropathy: Secondary | ICD-10-CM

## 2020-06-26 DIAGNOSIS — M545 Low back pain, unspecified: Secondary | ICD-10-CM

## 2020-06-26 DIAGNOSIS — I10 Essential (primary) hypertension: Secondary | ICD-10-CM

## 2020-06-26 MED ORDER — HYDROCODONE-ACETAMINOPHEN 10-325 MG PO TABS: ORAL_TABLET | ORAL | 0 refills | Status: DC

## 2020-06-26 NOTE — Progress Notes (Signed)
Subjective:    Patient ID: Peter [name], male    DOB: Mar 03, 1941, 79 y.o.   MRN: [id number]  Diabetes He presents for his follow-up diabetic visit. He has type 2 diabetes mellitus. There are no hypoglycemic associated symptoms. Pertinent negatives for hypoglycemia include no confusion, dizziness or headaches. There are no diabetic associated symptoms. Pertinent negatives for diabetes include no chest pain and no fatigue. There are no hypoglycemic complications. There are no diabetic complications. Risk factors for coronary artery disease include hypertension. He sees a podiatrist (not recently but has seen one in the past).  Results for orders placed or performed in visit on 03/12/20  Lipid panel  Result Value Ref Range   Cholesterol, Total 107 100 - 199 mg/dL   Triglycerides 100 0 - 149 mg/dL   HDL 38 (L) >39 mg/dL   VLDL Cholesterol Cal 19 5 - 40 mg/dL   LDL Chol Calc (NIH) 50 0 - 99 mg/dL   Chol/HDL Ratio 2.8 0.0 - 5.0 ratio  Hepatic function panel  Result Value Ref Range   Total Protein 7.4 6.0 - 8.5 g/dL   Albumin 4.4 3.7 - 4.7 g/dL   Bilirubin Total 0.7 0.0 - 1.2 mg/dL   Bilirubin, Direct 0.22 0.00 - 0.40 mg/dL   Alkaline Phosphatase 94 48 - 121 IU/L   AST 21 0 - 40 IU/L   ALT 16 0 - 44 IU/L  Basic metabolic panel  Result Value Ref Range   Glucose 110 (H) 65 - 99 mg/dL   BUN 20 8 - 27 mg/dL   Creatinine, Ser 1.28 (H) 0.76 - 1.27 mg/dL   GFR calc non Af Amer 53 (L) >59 mL/min/1.73   GFR calc Af Amer 62 >59 mL/min/1.73   BUN/Creatinine Ratio 16 10 - 24   Sodium 139 134 - 144 mmol/L   Potassium 4.2 3.5 - 5.2 mmol/L   Chloride 102 96 - 106 mmol/L   CO2 24 20 - 29 mmol/L   Calcium 9.2 8.6 - 10.2 mg/dL  Hemoglobin A1c  Result Value Ref Range   Hgb A1c MFr Bld 7.1 (H) 4.8 - 5.6 %   Est. average glucose Bld gHb Est-mCnc 157 mg/dL   This patient was seen today for chronic pain  The medication list was reviewed and updated.   -Compliance with medication: He relates he  takes him on most days  - Number patient states they take daily: One or 2/day  -when was the last dose patient took? Has ran out of his medicine  The patient was advised the importance of maintaining medication and not using illegal substances with these.  Here for refills and follow up  The patient was educated that we can provide 3 monthly scripts for their medication, it is their responsibility to follow the instructions.  Side effects or complications from medications: Denies side effects  Patient is aware that pain medications are meant to minimize the severity of the pain to allow their pain levels to improve to allow for better function. They are aware of that pain medications cannot totally remove their pain.  Due for UDT ( at least once per year) : Yearly basis  Scale of 1 to 10 ( 1 is least 10 is most) Your pain level without the medicine: Seven Your pain level with medication three  Scale 1 to 10 ( 1-helps very little, 10 helps very well) How well does your pain medication reduce your pain so you can function better through out the  day? Eight    Labs were discussed in detail   Review of Systems  Constitutional: Negative for diaphoresis and fatigue.  HENT: Negative for congestion and rhinorrhea.   Respiratory: Negative for cough and shortness of breath.   Cardiovascular: Negative for chest pain and leg swelling.  Gastrointestinal: Negative for abdominal pain and diarrhea.  Skin: Negative for color change and rash.  Neurological: Negative for dizziness and headaches.  Psychiatric/Behavioral: Negative for behavioral problems and confusion.       Objective:   Physical Exam Vitals reviewed.  Constitutional:      General: He is not in acute distress. HENT:     Head: Normocephalic and atraumatic.  Eyes:     General:        Right eye: No discharge.        Left eye: No discharge.  Neck:     Trachea: No tracheal deviation.  Cardiovascular:     Rate and Rhythm:  Normal rate and regular rhythm.     Heart sounds: Normal heart sounds. No murmur heard.   Pulmonary:     Effort: Pulmonary effort is normal. No respiratory distress.     Breath sounds: Normal breath sounds.  Lymphadenopathy:     Cervical: No cervical adenopathy.  Skin:    General: Skin is warm and dry.  Neurological:     Mental Status: He is alert.     Coordination: Coordination normal.  Psychiatric:        Behavior: Behavior normal.    Fall Risk  03/12/2020 10/27/2019 09/11/2019 06/27/2017 06/01/2015  Falls in the past year? 0 0 0 No No  Comment - - - - -  Risk for fall due to : - - - - -  Risk for fall due to: Comment - - - - -  Follow up Falls evaluation completed Falls evaluation completed Falls evaluation completed - -          Assessment & Plan:  1. HTN (hypertension), benign Blood pressure good control continue current measures  2. Seborrheic keratoses Seborrheic keratosis on his left side I do not feel it needs removal  3. Chronic bilateral low back pain without sciatica Chronic low back pain The patient was seen in followup for chronic pain. A review over at their current pain status was discussed. Drug registry was checked. Prescriptions were given.  Regular follow-up recommended. Discussion was held regarding the importance of compliance with medication as well as pain medication contract.  Patient was informed that medication may cause drowsiness and should not be combined  with other medications/alcohol or street drugs. If the patient feels medication is causing altered alertness then do not drive or operate dangerous equipment.  Patient responsibly takes his medications refills were sent in  4. Type 2 diabetes mellitus with peripheral neuropathy (HCC) A1c stable continue current measures

## 2020-06-29 ENCOUNTER — Other Ambulatory Visit: Payer: Self-pay | Admitting: *Deleted

## 2020-06-29 MED ORDER — ROSUVASTATIN CALCIUM 40 MG PO TABS: 40.0000 mg | ORAL_TABLET | Freq: Every day | ORAL | 0 refills | Status: DC

## 2020-07-06 ENCOUNTER — Other Ambulatory Visit: Payer: Self-pay

## 2020-07-09 ENCOUNTER — Ambulatory Visit: Payer: Medicare Other | Admitting: Cardiology

## 2020-07-10 ENCOUNTER — Telehealth: Payer: Self-pay | Admitting: *Deleted

## 2020-07-10 NOTE — Telephone Encounter (Signed)
Pt notified that Bril[facility] is in office.

## 2020-07-10 NOTE — Telephone Encounter (Signed)
Called to notify pt that patient assistance Brilinta has arrived in office. No answer, left voicemail to call back.

## 2020-07-20 ENCOUNTER — Encounter: Payer: Self-pay | Admitting: Cardiology

## 2020-07-20 ENCOUNTER — Ambulatory Visit: Payer: Medicare Other | Admitting: Cardiology

## 2020-07-20 VITALS — BP 130/78 | HR 62 | Ht 72.0 in | Wt 189.0 lb

## 2020-07-20 DIAGNOSIS — I10 Essential (primary) hypertension: Secondary | ICD-10-CM

## 2020-07-20 DIAGNOSIS — I5022 Chronic systolic (congestive) heart failure: Secondary | ICD-10-CM | POA: Diagnosis not present

## 2020-07-20 DIAGNOSIS — I2510 Atherosclerotic heart disease of native coronary artery without angina pectoris: Secondary | ICD-10-CM

## 2020-07-20 NOTE — Patient Instructions (Signed)

## 2020-07-20 NOTE — Progress Notes (Signed)
Clinical Summary Mr. Norcia is a 79 y.o.male seen today for follow up of the following medical problems.    1. CAD/ICM/Chronic systolic HF - hx of prior CABG 1997 at [facility] - stent to SVG-PDA and PTCA of the LAD in 05/2002. Normal LVEF at that time - 2008 heart cath [facility] had repeat stenting, he is unsure of the details - cath 2015showing patent SVG-OM1-OM2, SVG-RI, SVG-D1, and LIMA-LAD with occluded SVG-PDA and severe stenosis of mid-LAD at LIMA insertion with DES placed),  - 04/2018 cathshowed severe 3-vessel CAD with patent LIMA-LAD, patent stent along mid-LAD, patent SVG-OM1-OM2, and patent SVG-RI with occlusion of D1 and known occlusion of SVG-PDA. Thediagonal occlusion was thought tobe old, therefore continued medical therapy was recommended. - 04/2018 echo LVEF 30-35%.    02/2020 cath as reported below, DES to SVG-diag. Severe residual small vessel disease.  - no recent chest pain. No SOB or doe Compliant with meds  2. HTN -he is compliant with meds  3. HL - he stopped his statin, not intersted in restarting  06/2020 TC 107 TG 100 HDL 38 LDL 50   4. AAA - followed by vascular    Past Medical History:  Diagnosis Date  . AAA (abdominal aortic aneurysm) ([place])   . CAD (coronary artery disease)    a. s/p CABG in 1997 b. stent to SVG-PDA and PTCA of LAD in 2003 c. cath in 2015 showing patent SVG-OM1-OM2, SVG-RI, SVG-D1, and LIMA-LAD with occluded SVG-PDA and severe stenosis of mid-LAD at LIMA insertion with DES placed d. 04/2018: similar results to 2015 with patent LAD stent; occlusion of small caliber D1 stenosis with medical management recommended.   . Chronic back pain 07/25/2018   Chronic lumbar pain uses hydrocodone intermittently  . Chronic systolic CHF (congestive heart failure) ([place])   . COPD (chronic obstructive pulmonary disease) ([place])   . Diabetes mellitus    diet controlled  . Dyslipidemia   . Hypertension   . Myocardial  infarction ([place]) 1997   stents x2 (2003/2007)     Allergies  Allergen Reactions  . Dye Fdc Red [Red Dye] Hives    hives  . Ivp Dye [Iodinated Diagnostic Agents] Hives  . Plavix [Clopidogrel] Hives    Unsure if dye or Plavix  . Keflex [Cephalexin] Other (See Comments)    hoarsness     Current Outpatient Medications  Medication Sig Dispense Refill  . Aloe Vera Juice LIQD Take 8 oz by mouth daily.    Marland Kitchen aspirin EC 81 MG tablet Take 1 tablet (81 mg total) by mouth daily. 90 tablet 3  . diphenhydrAMINE (BENYLIN) 12.5 MG/5ML syrup Take 20 mLs (50 mg total) by mouth once for 1 dose. Take 20 mL at 10am 5/6 (day of procedure) 120 mL 0  . HYDROcodone-acetaminophen (NORCO) 10-325 MG tablet Take one tablet po TID prn 60 tablet 0  . HYDROcodone-acetaminophen (NORCO) 10-325 MG tablet Take one tablet po TID prn 60 tablet 0  . HYDROcodone-acetaminophen (NORCO) 10-325 MG tablet Take one tablet po TID prn 60 tablet 0  . losartan (COZAAR) 25 MG tablet Take 1 tablet (25 mg total) by mouth daily. 30 tablet 6  . metoprolol succinate (TOPROL-XL) 25 MG 24 hr tablet Take 1 tablet (25 mg total) by mouth daily. 90 tablet 1  . nitroGLYCERIN (NITROSTAT) 0.4 MG SL tablet Place 1 tablet (0.4 mg total) under the tongue every 5 (five) minutes as needed for chest pain. 30 tablet 3  .  rosuvastatin (CRESTOR) 40 MG tablet Take 1 tablet (40 mg total) by mouth daily. 30 tablet 0  . ticagrelor (BRILINTA) 90 MG TABS tablet Take 1 tablet (90 mg total) by mouth 2 (two) times daily. 60 tablet 11   No current facility-administered medications for this visit.     Past Surgical History:  Procedure Laterality Date  . CORONARY ANGIOPLASTY  2003/2007   2 stents  . CORONARY ANGIOPLASTY WITH STENT PLACEMENT  08/14/14   DES to LIMA-LAD insertion  . CORONARY ARTERY BYPASS GRAFT  1997   7 vessels  . CORONARY STENT INTERVENTION N/A 02/24/2020   Procedure: CORONARY STENT INTERVENTION;  Surgeon: Sherren Mocha, MD;  Location: [place] [facility];  Service: Cardiovascular;  Laterality: N/A;  . CORONARY STENT PLACEMENT  08/15/2014   MID LAD  DES  by Dr Burt Knack  . CORONARY/GRAFT ANGIOGRAPHY N/A 02/24/2020   Procedure: CORONARY/GRAFT ANGIOGRAPHY;  Surgeon: Sherren Mocha, MD;  Location: [facility];  Service: Cardiovascular;  Laterality: N/A;  . CYSTECTOMY  2005   top of head-[place]  . EAR CYST EXCISION  07/09/2012   Procedure: CYST REMOVAL;  Surgeon: Jamesetta So, MD;  Location: [facility];  Service: General;  Laterality: N/A;  . LEFT HEART CATH AND CORS/GRAFTS ANGIOGRAPHY N/A 04/16/2018   Procedure: LEFT HEART CATH AND CORS/GRAFTS ANGIOGRAPHY;  Surgeon: Martinique, Peter M, MD;  Location: [place] [facility];  Service: Cardiovascular;  Laterality: N/A;  . LEFT HEART CATHETERIZATION WITH CORONARY/GRAFT ANGIOGRAM N/A 08/14/2014   Procedure: LEFT HEART CATHETERIZATION WITH Beatrix Fetters;  Surgeon: Blane Ohara, MD;  Location: [facility];  Service: Cardiovascular;  Laterality: N/A;     Allergies  Allergen Reactions  . Dye Fdc Red [Red Dye] Hives    hives  . Ivp Dye [Iodinated Diagnostic Agents] Hives  . Plavix [Clopidogrel] Hives    Unsure if dye or Plavix  . Keflex [Cephalexin] Other (See Comments)    hoarsness      Family History  Problem Relation Age of Onset  . Other Father        deceased after ?TCS or barium enema, age 56s  . Hypertension Father   . Heart attack Mother   . Colon cancer Neg Hx   . Liver disease Neg Hx      Social History Mr. Leonhard reports that he quit smoking about 20 years ago. His smoking use included cigarettes. He has a 120.00 pack-year smoking history. He has never used smokeless tobacco. Mr. Heyer reports no history of alcohol use.   Review of Systems CONSTITUTIONAL: No weight loss, fever, chills, weakness or fatigue.  HEENT: Eyes: No visual loss, blurred vision, double vision or yellow sclerae.No hearing loss, sneezing, congestion, runny nose or sore  throat.  SKIN: No rash or itching.  CARDIOVASCULAR: per hpi RESPIRATORY: No shortness of breath, cough or sputum.  GASTROINTESTINAL: No anorexia, nausea, vomiting or diarrhea. No abdominal pain or blood.  GENITOURINARY: No burning on urination, no polyuria NEUROLOGICAL: No headache, dizziness, syncope, paralysis, ataxia, numbness or tingling in the extremities. No change in bowel or bladder control.  MUSCULOSKELETAL: No muscle, back pain, joint pain or stiffness.  LYMPHATICS: No enlarged nodes. No history of splenectomy.  PSYCHIATRIC: No history of depression or anxiety.  ENDOCRINOLOGIC: No reports of sweating, cold or heat intolerance. No polyuria or polydipsia.  Marland Kitchen   Physical Examination Today's Vitals   07/20/20 1506  BP: 130/78  Pulse: 62  SpO2: 97%  Weight: 189 lb (85.7 kg)  Height: 6' (1.829 m)   Body mass index is 25.63 kg/m.  Gen: resting comfortably, no acute distress HEENT: no scleral icterus, pupils equal round and reactive, no palptable cervical adenopathy,  CV: RRR, no m/r/g, no jvd Resp: Clear to auscultation bilaterally GI: abdomen is soft, non-tender, non-distended, normal bowel sounds, no hepatosplenomegaly MSK: extremities are warm, no edema.  Skin: warm, no rash Neuro:  no focal deficits Psych: appropriate affect   Diagnostic Studies  02/2020 echo IMPRESSIONS    1. Left ventricular ejection fraction, by estimation, is 40 to 45%. The  left ventricle has mildly decreased function. The left ventricle  demonstrates regional wall motion abnormalities (see scoring  diagram/findings for description). There is mild  concentric left ventricular hypertrophy. Left ventricular diastolic  parameters are consistent with Grade I diastolic dysfunction (impaired  relaxation).  2. Right ventricular systolic function is normal. The right ventricular  size is normal. Tricuspid regurgitation signal is inadequate for assessing  PA pressure.  3. The mitral valve is  grossly normal. No evidence of mitral valve  regurgitation. No evidence of mitral stenosis.  4. The aortic valve is tricuspid. Aortic valve regurgitation is trivial.  Mild aortic valve sclerosis is present, with no evidence of aortic valve  stenosis.  5. Aortic dilatation noted. 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  6. The inferior vena cava is normal in size with greater than 50%  respiratory variability, suggesting right atrial pressure of 3 mmHg.   02/2020 cath 1. 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 2. S/P CABG with continued patency of the LIMA-LAD, sequential SVG-OM1 and OM2, and severe stenosis of the SVG-diagonal 3. Chronic occlusion of the SVG-PDA/PLA branches with left-to-right collaterals present 4. Successful SVG-PCI with stenting of the proximal and distal body of the SVG-diagonal, with transient no-reflow and severe small vessel distal disease.   Recommend aggressive medical therapy, minimum 12 months of ASA/ticagrelor consider long-term if tolerated    Assessment and Plan  1. CAD/ICM/Chronic systolic heart -no symptoms, continue current meds including DAPT at least until 02/2021  2. Hyperlipidemia - at goal, continue statin  3. HTN - he is at goal, continue current meds     Arnoldo Lenis, M.D.

## 2020-07-23 ENCOUNTER — Ambulatory Visit
Admission: EM | Admit: 2020-07-23 | Discharge: 2020-07-23 | Disposition: A | Discharge status: Home / Self Care | Payer: Medicare Other | Attending: Emergency Medicine | Admitting: Emergency Medicine

## 2020-07-23 ENCOUNTER — Encounter: Payer: Self-pay | Admitting: Emergency Medicine

## 2020-07-23 ENCOUNTER — Other Ambulatory Visit: Payer: Self-pay

## 2020-07-23 DIAGNOSIS — J0100 Acute maxillary sinusitis, unspecified: Secondary | ICD-10-CM

## 2020-07-23 MED ORDER — AMOXICILLIN 500 MG PO CAPS: 500.0000 mg | ORAL_CAPSULE | Freq: Two times a day (BID) | ORAL | 0 refills | Status: DC

## 2020-07-23 MED ORDER — FLUTICASONE PROPIONATE 50 MCG/ACT NA SUSP: 1.0000 | Freq: Every day | NASAL | 0 refills | Status: DC

## 2020-07-23 NOTE — Discharge Instructions (Addendum)
Rest and push fluids Amoxicillin prescribed.  Take as directed and to completion Flonase was prescribed Continue with OTC ibuprofen/tylenol as needed for pain Follow up with PCP or [facility] if symptoms persists Return or go to the ED if you have any new or worsening symptoms such as fever, chills, worsening sinus pain/pressure, cough, sore throat, chest pain, shortness of breath, abdominal pain, changes in bowel or bladder habits, etc..Marland KitchenMarland Kitchen

## 2020-07-23 NOTE — ED Triage Notes (Signed)
Sinus congestion x 2 weeks and toothache

## 2020-07-23 NOTE — ED Provider Notes (Signed)
[place]   [id number] 07/23/20 Arrival Time: 1540   CC: Sinus symptom   SUBJECTIVE: History from: patient.  ORVILL COULTHARD is a 79 y.o. male who presented to the urgent care with a complaint of sinus congestion and tooth ache for the past 2 weeks.  Denies sick exposure to COVID, flu or strep.  Denies recent travel.  Has tried OTC medication with no relief.  Symptoms are made worse with lying down.  Denies  previous symptoms in the past.   Denies fever, chills, fatigue, sinus pain, rhinorrhea, sore throat, SOB, wheezing, chest pain, nausea, changes in bowel or bladder habits.    ROS: As per HPI.  All other pertinent ROS negative.     Past Medical History:  Diagnosis Date  . AAA (abdominal aortic aneurysm) ([place])   . CAD (coronary artery disease)    a. s/p CABG in 1997 b. stent to SVG-PDA and PTCA of LAD in 2003 c. cath in 2015 showing patent SVG-OM1-OM2, SVG-RI, SVG-D1, and LIMA-LAD with occluded SVG-PDA and severe stenosis of mid-LAD at LIMA insertion with DES placed d. 04/2018: similar results to 2015 with patent LAD stent; occlusion of small caliber D1 stenosis with medical management recommended.   . Chronic back pain 07/25/2018   Chronic lumbar pain uses hydrocodone intermittently  . Chronic systolic CHF (congestive heart failure) ([place])   . COPD (chronic obstructive pulmonary disease) ([place])   . Diabetes mellitus    diet controlled  . Dyslipidemia   . Hypertension   . Myocardial infarction [facility]) 1997   stents x2 (2003/2007)   Past Surgical History:  Procedure Laterality Date  . CORONARY ANGIOPLASTY  2003/2007   2 stents  . CORONARY ANGIOPLASTY WITH STENT PLACEMENT  08/14/14   DES to LIMA-LAD insertion  . CORONARY ARTERY BYPASS GRAFT  1997   7 vessels  . CORONARY STENT INTERVENTION N/A 02/24/2020   Procedure: CORONARY STENT INTERVENTION;  Surgeon: Sherren Mocha, MD;  Location: [place] [facility];  Service: Cardiovascular;  Laterality: N/A;  . CORONARY STENT  PLACEMENT  08/15/2014   MID LAD  DES  by Dr Burt Knack  . CORONARY/GRAFT ANGIOGRAPHY N/A 02/24/2020   Procedure: CORONARY/GRAFT ANGIOGRAPHY;  Surgeon: Sherren Mocha, MD;  Location: [place] [facility];  Service: Cardiovascular;  Laterality: N/A;  . CYSTECTOMY  2005   top of head-[place]  . EAR CYST EXCISION  07/09/2012   Procedure: CYST REMOVAL;  Surgeon: Jamesetta So, MD;  Location: [facility];  Service: General;  Laterality: N/A;  . LEFT HEART CATH AND CORS/GRAFTS ANGIOGRAPHY N/A 04/16/2018   Procedure: LEFT HEART CATH AND CORS/GRAFTS ANGIOGRAPHY;  Surgeon: Martinique, Peter M, MD;  Location: [place] [facility];  Service: Cardiovascular;  Laterality: N/A;  . LEFT HEART CATHETERIZATION WITH CORONARY/GRAFT ANGIOGRAM N/A 08/14/2014   Procedure: LEFT HEART CATHETERIZATION WITH Beatrix Fetters;  Surgeon: Blane Ohara, MD;  Location: [facility] C[facility];  Service: Cardiovascular;  Laterality: N/A;   Allergies  Allergen Reactions  . Dye Fdc Red [Red Dye] Hives    hives  . Ivp Dye [Iodinated Diagnostic Agents] Hives  . Plavix [Clopidogrel] Hives    Unsure if dye or Plavix  . Keflex [Cephalexin] Other (See Comments)    hoarsness   No current facility-administered medications on file prior to encounter.   Current Outpatient Medications on File Prior to Encounter  Medication Sig Dispense Refill  . Aloe Vera Juice LIQD Take 8 oz by mouth daily.    Marland Kitchen aspirin EC 81 MG  tablet Take 1 tablet (81 mg total) by mouth daily. 90 tablet 3  . diphenhydrAMINE (BENYLIN) 12.5 MG/5ML syrup Take 20 mLs (50 mg total) by mouth once for 1 dose. Take 20 mL at 10am 5/6 (day of procedure) 120 mL 0  . HYDROcodone-acetaminophen (NORCO) 10-325 MG tablet Take one tablet po TID prn 60 tablet 0  . losartan (COZAAR) 25 MG tablet Take 1 tablet (25 mg total) by mouth daily. 30 tablet 6  . metoprolol succinate (TOPROL-XL) 25 MG 24 hr tablet Take 1 tablet (25 mg total) by mouth daily. 90 tablet 1  . nitroGLYCERIN (NITROSTAT) 0.4 MG SL  tablet Place 1 tablet (0.4 mg total) under the tongue every 5 (five) minutes as needed for chest pain. 30 tablet 3  . rosuvastatin (CRESTOR) 40 MG tablet Take 1 tablet (40 mg total) by mouth daily. 30 tablet 0  . ticagrelor (BRILINTA) 90 MG TABS tablet Take 1 tablet (90 mg total) by mouth 2 (two) times daily. 60 tablet 11   Social History   Socioeconomic History  . Marital status: Married    Spouse name: Not on file  . Number of children: Not on file  . Years of education: Not on file  . Highest education level: Not on file  Occupational History  . Not on file  Tobacco Use  . Smoking status: Former Smoker    Packs/day: 3.00    Years: 40.00    Pack years: 120.00    Types: Cigarettes    Quit date: 07/02/2000    Years since quitting: 20.0  . Smokeless tobacco: Never Used  Vaping Use  . Vaping Use: Never used  Substance and Sexual Activity  . Alcohol use: No    Alcohol/week: 10.0 standard drinks    Types: 10 Cans of beer per week    Comment: quit 3 months (01/2013). 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  . Drug use: No  . Sexual activity: Not Currently    Birth control/protection: None  Other Topics Concern  . Not on file  Social History Narrative  . Not on file   Social Determinants of Health   Financial Resource Strain:   . Difficulty of Paying Living Expenses: Not on file  Food Insecurity:   . Worried About Charity fundraiser in the Last Year: Not on file  . Ran Out of Food in the Last Year: Not on file  Transportation Needs:   . Lack of Transportation (Medical): Not on file  . Lack of Transportation (Non-Medical): Not on file  Physical Activity:   . Days of Exercise per Week: Not on file  . Minutes of Exercise per Session: Not on file  Stress:   . Feeling of Stress : Not on file  Social Connections:   . Frequency of Communication with Friends and Family: Not on file  . Frequency of Social Gatherings with Friends and Family: Not on file    . Attends Religious Services: Not on file  . Active Member of Clubs or Organizations: Not on file  . Attends Archivist Meetings: Not on file  . Marital Status: Not on file  Intimate Partner Violence:   . Fear of Current or Ex-Partner: Not on file  . Emotionally Abused: Not on file  . Physically Abused: Not on file  . Sexually Abused: Not on file   Family History  Problem Relation Age of Onset  . Other Father  deceased after ?TCS or barium enema, age 37s  . Hypertension Father   . Heart attack Mother   . Colon cancer Neg Hx   . Liver disease Neg Hx     OBJECTIVE:  Vitals:   07/23/20 1541  BP: (!) 165/74  Pulse: (!) 58  Resp: 16  Temp: 98.4 F (36.9 C)  TempSrc: Oral  SpO2: 98%     Physical Exam Vitals and nursing note reviewed.  Constitutional:      General: He is not in acute distress.    Appearance: Normal appearance. He is normal weight. He is not ill-appearing, toxic-appearing or diaphoretic.  HENT:     Head: Normocephalic.     Right Ear: Tympanic membrane, ear canal and external ear normal. There is no impacted cerumen.     Left Ear: Tympanic membrane, ear canal and external ear normal. There is no impacted cerumen.     Nose:     Right Sinus: Maxillary sinus tenderness present.     Left Sinus: Maxillary sinus tenderness present.     Mouth/Throat:     Lips: Pink.     Mouth: Mucous membranes are moist.     Dentition: Abnormal dentition. Dental caries present.  Cardiovascular:     Rate and Rhythm: Normal rate and regular rhythm.     Pulses: Normal pulses.     Heart sounds: Normal heart sounds. No murmur heard.  No friction rub. No gallop.   Pulmonary:     Effort: Pulmonary effort is normal. No respiratory distress.     Breath sounds: Normal breath sounds. No stridor. No wheezing, rhonchi or rales.  Chest:     Chest wall: No tenderness.  Neurological:     Mental Status: He is alert and oriented to person, place, and time.      LABS:  No results found for this or any previous visit (from the past 24 hour(s)).   ASSESSMENT & PLAN:  1. Acute non-recurrent maxillary sinusitis     Meds ordered this encounter  Medications  . amoxicillin (AMOXIL) 500 MG capsule    Sig: Take 1 capsule (500 mg total) by mouth 2 (two) times daily.    Dispense:  21 capsule    Refill:  0  . fluticasone (FLONASE) 50 MCG/ACT nasal spray    Sig: Place 1 spray into both nostrils daily for 14 days.    Dispense:  16 g    Refill:  0   Discharge Instructions Rest and push fluids Amoxicillin prescribed.  Take as directed and to completion Flonase was prescribed Continue with OTC ibuprofen/tylenol as needed for pain Follow up with PCP or [facility] if symptoms persists Return or go to the ED if you have any new or worsening symptoms such as fever, chills, worsening sinus pain/pressure, cough, sore throat, chest pain, shortness of breath, abdominal pain, changes in bowel or bladder habits, etc....   Reviewed expectations re: course of current medical issues. Questions answered. Outlined signs and symptoms indicating need for more acute intervention. Patient verbalized understanding. After Visit Summary given.     Note: This document was prepared using Dragon voice recognition software and may include unintentional dictation errors.     Emerson Monte, FNP 07/23/20 1612

## 2020-08-18 ENCOUNTER — Other Ambulatory Visit: Payer: Self-pay | Admitting: Family Medicine

## 2020-08-19 ENCOUNTER — Other Ambulatory Visit: Payer: Self-pay | Admitting: Family Medicine

## 2020-08-27 ENCOUNTER — Telehealth: Payer: Self-pay | Admitting: Family Medicine

## 2020-08-27 DIAGNOSIS — I714 Abdominal aortic aneurysm, without rupture, unspecified: Secondary | ICD-10-CM

## 2020-08-27 NOTE — Telephone Encounter (Signed)
Please advise. Thank you

## 2020-08-27 NOTE — Telephone Encounter (Signed)
Pt states he needs an order for another sonogram on his aorta he has them done every 6 months.

## 2020-08-29 NOTE — Telephone Encounter (Signed)
His most recent ultrasound was in April.  He saw vascular surgery in May.  They did a CT scan and recommended a follow-up ultrasound in 6 months.  That would be toward the end of October.  Does patient want to do his ultrasound at vascular surgery?  Or does he want to do the ultrasound at the hospital? If he wants to do this at the hospital please order a 63-month follow-up ultrasound to be done 6 months from the last one which would be in October.  If he chooses to get this at vascular surgery the patient should call vascular surgery to schedule and please document accordingly. Reason aortic aneurysm

## 2020-08-31 NOTE — Telephone Encounter (Signed)
Patient wants ultrasound scheduled at [facility] to take with him to appt with vascular doctors the first part of November.  Ultrasound of Aorta scheduled at [facility] 09/08/2020 at 9:30am -arrive at 9:15am- Nothing to eat or drink after midnight- Patient notified and verbalized understanding.

## 2020-09-08 ENCOUNTER — Ambulatory Visit (HOSPITAL_COMMUNITY)
Admission: RE | Admit: 2020-09-08 | Discharge: 2020-09-08 | Disposition: A | Discharge status: Home / Self Care | Payer: Medicare Other | Source: Ambulatory Visit | Attending: Family Medicine | Admitting: Family Medicine

## 2020-09-08 ENCOUNTER — Other Ambulatory Visit: Payer: Self-pay

## 2020-09-08 DIAGNOSIS — I714 Abdominal aortic aneurysm, without rupture: Secondary | ICD-10-CM | POA: Diagnosis not present

## 2020-09-30 ENCOUNTER — Telehealth: Payer: Self-pay | Admitting: *Deleted

## 2020-09-30 NOTE — Telephone Encounter (Signed)
Pt notified that pt assistance brilinta has arrived in office

## 2020-11-03 ENCOUNTER — Ambulatory Visit: Payer: Medicare Other

## 2020-11-05 ENCOUNTER — Telehealth: Payer: Self-pay

## 2020-11-05 DIAGNOSIS — Z79899 Other long term (current) drug therapy: Secondary | ICD-10-CM

## 2020-11-05 DIAGNOSIS — I10 Essential (primary) hypertension: Secondary | ICD-10-CM

## 2020-11-05 DIAGNOSIS — E1142 Type 2 diabetes mellitus with diabetic polyneuropathy: Secondary | ICD-10-CM

## 2020-11-05 DIAGNOSIS — E785 Hyperlipidemia, unspecified: Secondary | ICD-10-CM

## 2020-11-05 DIAGNOSIS — N289 Disorder of kidney and ureter, unspecified: Secondary | ICD-10-CM

## 2020-11-05 NOTE — Telephone Encounter (Signed)
A1C lipid liv met 7 cbc Diabetes, hyperlipidemia, hypertension, thrombocytopenia Please do lab work by the end of January

## 2020-11-05 NOTE — Telephone Encounter (Signed)
Patient wanting to know if he needs labs for 6 month follow up in June.

## 2020-11-05 NOTE — Telephone Encounter (Signed)
Lab orders placed. Tried to contact patient but unable to reach patient.

## 2020-11-05 NOTE — Telephone Encounter (Signed)
Last labs completed 06/24/20 A1C, BMET, HEPATIC and LIPID. Please advise. Thank you

## 2020-11-09 NOTE — Telephone Encounter (Signed)
Pt.notified

## 2020-11-17 DIAGNOSIS — N289 Disorder of kidney and ureter, unspecified: Secondary | ICD-10-CM | POA: Diagnosis not present

## 2020-11-17 DIAGNOSIS — I10 Essential (primary) hypertension: Secondary | ICD-10-CM | POA: Diagnosis not present

## 2020-11-17 DIAGNOSIS — E1142 Type 2 diabetes mellitus with diabetic polyneuropathy: Secondary | ICD-10-CM | POA: Diagnosis not present

## 2020-11-17 DIAGNOSIS — Z79899 Other long term (current) drug therapy: Secondary | ICD-10-CM | POA: Diagnosis not present

## 2020-11-17 DIAGNOSIS — E785 Hyperlipidemia, unspecified: Secondary | ICD-10-CM | POA: Diagnosis not present

## 2020-11-18 ENCOUNTER — Encounter: Payer: Self-pay | Admitting: Family Medicine

## 2020-11-18 LAB — BASIC METABOLIC PANEL
BUN/Creatinine Ratio: 13 (ref 10–24)
BUN: 18 mg/dL (ref 8–27)
CO2: 27 mmol/L (ref 20–29)
Calcium: 9.4 mg/dL (ref 8.6–10.2)
Chloride: 100 mmol/L (ref 96–106)
Creatinine, Ser: 1.34 mg/dL — ABNORMAL HIGH (ref 0.76–1.27)
GFR calc Af Amer: 58 mL/min/{1.73_m2} — ABNORMAL LOW (ref >59)
GFR calc non Af Amer: 50 mL/min/{1.73_m2} — ABNORMAL LOW (ref >59)
Glucose: 114 mg/dL — ABNORMAL HIGH (ref 65–99)
Potassium: 4.2 mmol/L (ref 3.5–5.2)
Sodium: 137 mmol/L (ref 134–144)

## 2020-11-18 LAB — HEPATIC FUNCTION PANEL
ALT: 16 IU/L (ref 0–44)
AST: 22 IU/L (ref 0–40)
Albumin: 4.3 g/dL (ref 3.7–4.7)
Alkaline Phosphatase: 97 IU/L (ref 44–121)
Bilirubin Total: 0.7 mg/dL (ref 0.0–1.2)
Bilirubin, Direct: 0.22 mg/dL (ref 0.00–0.40)
Total Protein: 7.3 g/dL (ref 6.0–8.5)

## 2020-11-18 LAB — CBC WITH DIFFERENTIAL/PLATELET
Basophils Absolute: 0.1 10*3/uL (ref 0.0–0.2)
Basos: 1 %
EOS (ABSOLUTE): 0.6 10*3/uL — ABNORMAL HIGH (ref 0.0–0.4)
Eos: 7 %
Hematocrit: 39 % (ref 37.5–51.0)
Hemoglobin: 13.9 g/dL (ref 13.0–17.7)
Immature Grans (Abs): 0 10*3/uL (ref 0.0–0.1)
Immature Granulocytes: 0 %
Lymphocytes Absolute: 2.4 10*3/uL (ref 0.7–3.1)
Lymphs: 28 %
MCH: 34.5 pg — ABNORMAL HIGH (ref 26.6–33.0)
MCHC: 35.6 g/dL (ref 31.5–35.7)
MCV: 97 fL (ref 79–97)
Monocytes Absolute: 0.6 10*3/uL (ref 0.1–0.9)
Monocytes: 7 %
Neutrophils Absolute: 4.9 10*3/uL (ref 1.4–7.0)
Neutrophils: 57 %
Platelets: 131 10*3/uL — ABNORMAL LOW (ref 150–450)
RBC: 4.03 x10E6/uL — ABNORMAL LOW (ref 4.14–5.80)
RDW: 12.9 % (ref 11.6–15.4)
WBC: 8.6 10*3/uL (ref 3.4–10.8)

## 2020-11-18 LAB — HEMOGLOBIN A1C
Est. average glucose Bld gHb Est-mCnc: 134 mg/dL
Hgb A1c MFr Bld: 6.3 % — ABNORMAL HIGH (ref 4.8–5.6)

## 2020-11-18 LAB — LIPID PANEL
Chol/HDL Ratio: 3 ratio (ref 0.0–5.0)
Cholesterol, Total: 133 mg/dL (ref 100–199)
HDL: 45 mg/dL (ref >39)
LDL Chol Calc (NIH): 62 mg/dL (ref 0–99)
Triglycerides: 153 mg/dL — ABNORMAL HIGH (ref 0–149)
VLDL Cholesterol Cal: 26 mg/dL (ref 5–40)

## 2020-11-19 ENCOUNTER — Ambulatory Visit: Payer: Medicare Other | Admitting: Physician Assistant

## 2020-11-19 ENCOUNTER — Other Ambulatory Visit: Payer: Self-pay

## 2020-11-19 VITALS — BP 157/88 | HR 64 | Temp 98.6°F | Resp 20 | Ht 72.0 in | Wt 178.8 lb

## 2020-11-19 DIAGNOSIS — I714 Abdominal aortic aneurysm, without rupture, unspecified: Secondary | ICD-10-CM

## 2020-11-19 NOTE — Progress Notes (Signed)
Established Abdominal Aortic Aneurysm   History of Present Illness   Peter [name] is a 79 y.o. (04-06-1941) male who presents with chief complaint: follow up for AAA.  He had a CTA in 04/2020 which demonstrated a 4.8cm AAA.  He denies any new or changing abd or back pain.  He also denies any claudication, rest pain, or non healing wounds of bilateral lower extremities.  He is a former smoker.  He is on aspirin and statin daily.  He has diabetes mellitus but this is diet controlled.  He cares for his wife who has dementia.    The patient's PMH, PSH, SH, and FamHx were reviewed and are unchanged from prior visit.  Current Outpatient Medications  Medication Sig Dispense Refill  . Aloe Vera Juice LIQD Take 8 oz by mouth daily.    Marland Kitchen amoxicillin (AMOXIL) 500 MG capsule Take 1 capsule (500 mg total) by mouth 2 (two) times daily. 21 capsule 0  . aspirin EC 81 MG tablet Take 1 tablet (81 mg total) by mouth daily. 90 tablet 3  . HYDROcodone-acetaminophen (NORCO) 10-325 MG tablet Take one tablet po TID prn 60 tablet 0  . losartan (COZAAR) 25 MG tablet Take 1 tablet (25 mg total) by mouth daily. 30 tablet 6  . metoprolol succinate (TOPROL-XL) 25 MG 24 hr tablet Take 1 tablet (25 mg total) by mouth daily. 90 tablet 1  . nitroGLYCERIN (NITROSTAT) 0.4 MG SL tablet Place 1 tablet (0.4 mg total) under the tongue every 5 (five) minutes as needed for chest pain. 30 tablet 3  . rosuvastatin (CRESTOR) 40 MG tablet Take 1 tablet by mouth once daily 30 tablet 3  . ticagrelor (BRILINTA) 90 MG TABS tablet Take 1 tablet (90 mg total) by mouth 2 (two) times daily. 60 tablet 11  . ACCU-CHEK GUIDE test strip     . diphenhydrAMINE (BENYLIN) 12.5 MG/5ML syrup Take 20 mLs (50 mg total) by mouth once for 1 dose. Take 20 mL at 10am 5/6 (day of procedure) 120 mL 0  . fluticasone (FLONASE) 50 MCG/ACT nasal spray Place 1 spray into both nostrils daily for 14 days. 16 g 0   No current facility-administered medications for  this visit.    REVIEW OF SYSTEMS (negative unless checked):   Cardiac:  []  Chest pain or chest pressure? []  Shortness of breath upon activity? []  Shortness of breath when lying flat? []  Irregular heart rhythm?  Vascular:  []  Pain in calf, thigh, or hip brought on by walking? []  Pain in feet at night that wakes you up from your sleep? []  Blood clot in your veins? []  Leg swelling?  Pulmonary:  []  Oxygen at home? []  Productive cough? []  Wheezing?  Neurologic:  []  Sudden weakness in arms or legs? []  Sudden numbness in arms or legs? []  Sudden onset of difficult speaking or slurred speech? []  Temporary loss of vision in one eye? []  Problems with dizziness?  Gastrointestinal:  []  Blood in stool? []  Vomited blood?  Genitourinary:  []  Burning when urinating? []  Blood in urine?  Psychiatric:  []  Major depression  Hematologic:  []  Bleeding problems? []  Problems with blood clotting?  Dermatologic:  []  Rashes or ulcers?  Constitutional:  []  Fever or chills?  Ear/Nose/Throat:  []  Change in hearing? []  Nose bleeds? []  Sore throat?  Musculoskeletal:  []  Back pain? []  Joint pain? []  Muscle pain?   Physical Examination   Vitals:   11/19/20 1425  BP: (!) 157/88  Pulse: 64  Resp: 20  Temp: 98.6 F (37 C)  TempSrc: Temporal  SpO2: 98%  Weight: 178 lb 12.8 oz (81.1 kg)  Height: 6' (1.829 m)   Body mass index is 24.25 kg/m.  General:  WDWN in NAD; vital signs documented above Gait: Not observed HENT: WNL, normocephalic Pulmonary: normal non-labored breathing , without Rales, rhonchi,  wheezing Cardiac: regular HR Abdomen: soft, NT, no masses  Skin: without rashes Extremities: without ischemic changes, without Gangrene , without cellulitis; without open wounds;  Musculoskeletal: no muscle wasting or atrophy  Neurologic: A&O X 3;  No focal weakness or paresthesias are detected Psychiatric:  The pt has Normal affect.   Non-Invasive Vascular Imaging    AAA Duplex   Current size: 4.8 cm  Previous size: 4.8 cm    Medical Decision Making   Peter [name] is a 79 y.o. (12-28-1940) male who presents with: asymptomatic AAA for surveillance   AAA duplex performed 09/08/20 demonstrates a stable size of 4.8cm  There is no indication for repair of AAA at this time  Repeat AAA duplex in 6 months;  Patient would prefer to have ultrasound at [facility] and called the results.  Patient will call/return to office sooner with any questions or concerns   Dagoberto Ligas PA-C [facility] Office: [phone]  Clinic MD: Scot Dock

## 2020-11-20 ENCOUNTER — Other Ambulatory Visit: Payer: Self-pay

## 2020-11-20 ENCOUNTER — Other Ambulatory Visit: Payer: Self-pay | Admitting: Family Medicine

## 2020-11-20 DIAGNOSIS — I714 Abdominal aortic aneurysm, without rupture, unspecified: Secondary | ICD-10-CM

## 2020-11-29 ENCOUNTER — Other Ambulatory Visit: Payer: Self-pay | Admitting: Cardiology

## 2020-11-29 ENCOUNTER — Other Ambulatory Visit: Payer: Self-pay | Admitting: Family Medicine

## 2020-12-03 ENCOUNTER — Telehealth (INDEPENDENT_AMBULATORY_CARE_PROVIDER_SITE_OTHER): Payer: Medicare Other | Admitting: Family Medicine

## 2020-12-03 ENCOUNTER — Other Ambulatory Visit: Payer: Self-pay

## 2020-12-03 ENCOUNTER — Telehealth: Payer: Self-pay | Admitting: *Deleted

## 2020-12-03 ENCOUNTER — Other Ambulatory Visit: Payer: Self-pay | Admitting: Family Medicine

## 2020-12-03 DIAGNOSIS — I10 Essential (primary) hypertension: Secondary | ICD-10-CM | POA: Diagnosis not present

## 2020-12-03 DIAGNOSIS — G8929 Other chronic pain: Secondary | ICD-10-CM

## 2020-12-03 DIAGNOSIS — N1831 Chronic kidney disease, stage 3a: Secondary | ICD-10-CM | POA: Diagnosis not present

## 2020-12-03 DIAGNOSIS — E1142 Type 2 diabetes mellitus with diabetic polyneuropathy: Secondary | ICD-10-CM

## 2020-12-03 DIAGNOSIS — M545 Low back pain, unspecified: Secondary | ICD-10-CM | POA: Diagnosis not present

## 2020-12-03 DIAGNOSIS — E785 Hyperlipidemia, unspecified: Secondary | ICD-10-CM | POA: Diagnosis not present

## 2020-12-03 MED ORDER — HYDROCODONE-ACETAMINOPHEN 10-325 MG PO TABS: ORAL_TABLET | ORAL | 0 refills | Status: DC

## 2020-12-03 NOTE — Telephone Encounter (Signed)
[name], rylander are scheduled for a virtual visit with your provider today.    Just as we do with appointments in the office, we must obtain your consent to participate.  Your consent will be active for this visit and any virtual visit you may have with one of our providers in the next 365 days.    If you have a MyChart account, I can also send a copy of this consent to you electronically.  All virtual visits are billed to your insurance company just like a traditional visit in the office.  As this is a virtual visit, video technology does not allow for your provider to perform a traditional examination.  This may limit your provider's ability to fully assess your condition.  If your provider identifies any concerns that need to be evaluated in person or the need to arrange testing such as labs, EKG, etc, we will make arrangements to do so.    Although advances in technology are sophisticated, we cannot ensure that it will always work on either your end or our end.  If the connection with a video visit is poor, we may have to switch to a telephone visit.  With either a video or telephone visit, we are not always able to ensure that we have a secure connection.   I need to obtain your verbal consent now.   Are you willing to proceed with your visit today?   FACUNDO ALLEMAND has provided verbal consent on 12/03/2020 for a virtual visit (video or telephone).   Kathleen Lime, RN 12/03/2020  11:25 AM

## 2020-12-03 NOTE — Progress Notes (Signed)
Subjective:    Patient ID: Peter [name], male    DOB: 1941-04-23, 79 y.o.   MRN: [id number]  Hypertension This is a chronic problem. The current episode started more than 1 year ago. Pertinent negatives include no chest pain, headaches or shortness of breath. Risk factors for coronary artery disease include dyslipidemia and male gender. Treatments tried: cozaar, metoprolol.   Results for orders placed or performed in visit on 11/05/20  Hemoglobin A1c  Result Value Ref Range   Hgb A1c MFr Bld 6.3 (H) 4.8 - 5.6 %   Est. average glucose Bld gHb Est-mCnc 134 mg/dL  Lipid Profile  Result Value Ref Range   Cholesterol, Total 133 100 - 199 mg/dL   Triglycerides 153 (H) 0 - 149 mg/dL   HDL 45 >39 mg/dL   VLDL Cholesterol Cal 26 5 - 40 mg/dL   LDL Chol Calc (NIH) 62 0 - 99 mg/dL   Chol/HDL Ratio 3.0 0.0 - 5.0 ratio  Hepatic function panel  Result Value Ref Range   Total Protein 7.3 6.0 - 8.5 g/dL   Albumin 4.3 3.7 - 4.7 g/dL   Bilirubin Total 0.7 0.0 - 1.2 mg/dL   Bilirubin, Direct 0.22 0.00 - 0.40 mg/dL   Alkaline Phosphatase 97 44 - 121 IU/L   AST 22 0 - 40 IU/L   ALT 16 0 - 44 IU/L  Basic Metabolic Panel (BMET)  Result Value Ref Range   Glucose 114 (H) 65 - 99 mg/dL   BUN 18 8 - 27 mg/dL   Creatinine, Ser 1.34 (H) 0.76 - 1.27 mg/dL   GFR calc non Af Amer 50 (L) >59 mL/min/1.73   GFR calc Af Amer 58 (L) >59 mL/min/1.73   BUN/Creatinine Ratio 13 10 - 24   Sodium 137 134 - 144 mmol/L   Potassium 4.2 3.5 - 5.2 mmol/L   Chloride 100 96 - 106 mmol/L   CO2 27 20 - 29 mmol/L   Calcium 9.4 8.6 - 10.2 mg/dL  CBC with Differential  Result Value Ref Range   WBC 8.6 3.4 - 10.8 x10E3/uL   RBC 4.03 (L) 4.14 - 5.80 x10E6/uL   Hemoglobin 13.9 13.0 - 17.7 g/dL   Hematocrit 39.0 37.5 - 51.0 %   MCV 97 79 - 97 fL   MCH 34.5 (H) 26.6 - 33.0 pg   MCHC 35.6 31.5 - 35.7 g/dL   RDW 12.9 11.6 - 15.4 %   Platelets 131 (L) 150 - 450 x10E3/uL   Neutrophils 57 Not Estab. %   Lymphs 28 Not  Estab. %   Monocytes 7 Not Estab. %   Eos 7 Not Estab. %   Basos 1 Not Estab. %   Neutrophils Absolute 4.9 1.4 - 7.0 x10E3/uL   Lymphocytes Absolute 2.4 0.7 - 3.1 x10E3/uL   Monocytes Absolute 0.6 0.1 - 0.9 x10E3/uL   EOS (ABSOLUTE) 0.6 (H) 0.0 - 0.4 x10E3/uL   Basophils Absolute 0.1 0.0 - 0.2 x10E3/uL   Immature Granulocytes 0 Not Estab. %   Immature Grans (Abs) 0.0 0.0 - 0.1 x10E3/uL    Discuss recent labs Pain in side for 6 months- has discussed it before.  Virtual Visit via Telephone Note  I connected with Peter [name] on 12/03/20 at 11:30 AM EST by telephone and verified that I am speaking with the correct person using two identifiers.  Location: Patient: home Provider: office   I discussed the limitations, risks, security and privacy concerns of performing an evaluation and management  service by telephone and the availability of in person appointments. I also discussed with the patient that there may be a patient responsible charge related to this service. The patient expressed understanding and agreed to proceed.   History of Present Illness:    Observations/Objective:   Assessment and Plan:   Follow Up Instructions:    I discussed the assessment and treatment plan with the patient. The patient was provided an opportunity to ask questions and all were answered. The patient agreed with the plan and demonstrated an understanding of the instructions.   The patient was advised to call back or seek an in-person evaluation if the symptoms worsen or if the condition fails to improve as anticipated.  I provided 30 including chart review documentation and time spent with patient minutes of non-face-to-face time during this encounter.      Review of Systems  Constitutional: Negative for activity change.  HENT: Negative for congestion and rhinorrhea.   Respiratory: Negative for cough and shortness of breath.   Cardiovascular: Negative for chest pain.   Gastrointestinal: Negative for abdominal pain, diarrhea, nausea and vomiting.  Genitourinary: Negative for dysuria and hematuria.  Musculoskeletal: Positive for back pain. Negative for arthralgias.  Neurological: Negative for weakness and headaches.  Psychiatric/Behavioral: Negative for behavioral problems and confusion.       Objective:   Physical Exam  Today's visit was via telephone Physical exam was not possible for this visit       Assessment & Plan:  1. Stage 3a chronic kidney disease (HCC) Creatinine has slightly gone up not in a dialysis range will watch closely may need to intervene with being seen by nephrology if it progresses but for now maintain as is by a healthy diet regular activity drinking water keeping sugar under control blood pressure under control in avoiding excessive protein recheck again in 6 months  2. HTN (hypertension), benign Blood pressure has been under good control continue current measures watch salt in diet  3. Type 2 diabetes mellitus with peripheral neuropathy (HCC) A1c looks good stay active continue Healthy diet 4. Dyslipidemia Continue medication patient takes on a regular basis to keep cholesterol under good control  5. Chronic bilateral low back pain without sciatica The patient was seen in followup for chronic pain. A review over at their current pain status was discussed. Drug registry was checked. Prescriptions were given.  Regular follow-up recommended. Discussion was held regarding the importance of compliance with medication as well as pain medication contract.  Patient was informed that medication may cause drowsiness and should not be combined  with other medications/alcohol or street drugs. If the patient feels medication is causing altered alertness then do not drive or operate dangerous equipment.  The patient sometimes takes 2 a day sometimes 1 a day sometimes none the day so therefore he will continue to do the best he can  at healthy eating and regular activity follow-up again in 3 months  We will do a in person visit in 2 weeks time because he is having left flank pain and discomfort he is concerned about the possibility of arrhythmia issue and may need to have x-rays

## 2020-12-16 ENCOUNTER — Ambulatory Visit: Payer: Medicare Other | Admitting: Family Medicine

## 2020-12-16 ENCOUNTER — Telehealth: Payer: Self-pay | Admitting: *Deleted

## 2020-12-16 NOTE — Telephone Encounter (Signed)
Pt requesting disability parking placard.  Form on your door if you agree.

## 2020-12-16 NOTE — Telephone Encounter (Signed)
Form was filled out thank you 

## 2020-12-16 NOTE — Telephone Encounter (Signed)
Pt contacted and verbalized understanding. Form up front for pick up

## 2020-12-24 ENCOUNTER — Telehealth: Payer: Self-pay | Admitting: *Deleted

## 2020-12-24 NOTE — Telephone Encounter (Signed)
Called to notify pt that Kary Kos has arrived in office. No answer left voicemail to call back.

## 2021-01-20 ENCOUNTER — Other Ambulatory Visit: Payer: Self-pay | Admitting: Family Medicine

## 2021-01-26 ENCOUNTER — Ambulatory Visit: Payer: Medicare Other | Admitting: Cardiology

## 2021-02-09 ENCOUNTER — Other Ambulatory Visit: Payer: Self-pay

## 2021-02-09 ENCOUNTER — Ambulatory Visit: Payer: Medicare Other | Admitting: Cardiology

## 2021-02-09 ENCOUNTER — Encounter: Payer: Self-pay | Admitting: Cardiology

## 2021-02-09 VITALS — BP 158/82 | HR 66 | Ht 72.0 in | Wt 178.9 lb

## 2021-02-09 DIAGNOSIS — I10 Essential (primary) hypertension: Secondary | ICD-10-CM | POA: Diagnosis not present

## 2021-02-09 DIAGNOSIS — E782 Mixed hyperlipidemia: Secondary | ICD-10-CM | POA: Diagnosis not present

## 2021-02-09 DIAGNOSIS — I2510 Atherosclerotic heart disease of native coronary artery without angina pectoris: Secondary | ICD-10-CM

## 2021-02-09 MED ORDER — TICAGRELOR 60 MG PO TABS: 60.0000 mg | ORAL_TABLET | Freq: Two times a day (BID) | ORAL | 11 refills | Status: DC

## 2021-02-09 NOTE — Patient Instructions (Signed)
Medication Instructions:  Your physician has recommended you make the following change in your medication:   Decrease Brilinta to 60 mg Two Times Daily   *If you need a refill on your cardiac medications before your next appointment, please call your pharmacy*   Lab Work: NONE   If you have labs (blood work) drawn today and your tests are completely normal, you will receive your results only by: Marland Kitchen MyChart Message (if you have MyChart) OR . A paper copy in the mail If you have any lab test that is abnormal or we need to change your treatment, we will call you to review the results.   Testing/Procedures: NONE    Follow-Up: At [facility], you and your health needs are our priority.  As part of our continuing mission to provide you with exceptional heart care, we have created designated Provider Care Teams.  These Care Teams include your primary Cardiologist (physician) and Advanced Practice Providers (APPs -  Physician Assistants and Nurse Practitioners) who all work together to provide you with the care you need, when you need it.  We recommend signing up for the patient portal called "MyChart".  Sign up information is provided on this After Visit Summary.  MyChart is used to connect with patients for Virtual Visits (Telemedicine).  Patients are able to view lab/test results, encounter notes, upcoming appointments, etc.  Non-urgent messages can be sent to your provider as well.   To learn more about what you can do with MyChart, go to [email or web address].    Your next appointment:   6 month(s)  The format for your next appointment:   In Person  Provider:   Carlyle Dolly, MD   Other Instructions Thank you for choosing [facility]!

## 2021-02-09 NOTE — Progress Notes (Signed)
Clinical Summary Mr. Ledin is a 80 y.o.male seen today for follow up of the following medical problems.   1. CAD/ICM/Chronic systolic HF - hx of prior CABG 1997 at [facility] - stent to SVG-PDA and PTCA of the LAD in 05/2002. Normal LVEF at that time - 2008 heart cath [facility] had repeat stenting, he is unsure of the details - cath 2015showing patent SVG-OM1-OM2, SVG-RI, SVG-D1, and LIMA-LAD with occluded SVG-PDA and severe stenosis of mid-LAD at LIMA insertion with DES placed),  - 04/2018 cathshowed severe 3-vessel CAD with patent LIMA-LAD, patent stent along mid-LAD, patent SVG-OM1-OM2, and patent SVG-RI with occlusion of D1 and known occlusion of SVG-PDA. Thediagonal occlusion was thought tobe old, therefore continued medical therapy was recommended. - 04/2018 echo LVEF 30-35%.   02/2020 cath as reported below, DES to SVG-diag. Severe residual small vessel disease.    -no recent chest pain.  - no SOB/DOE - compliant with meds. Heavy brusiing on DAPT   2. HTN - compliant with meds  3. HL - he stopped his statin, not intersted in restarting  06/2020 TC 107 TG 100 HDL 38 LDL 50  11/2020 TC 133 TG 153 HDL 45 LDL 62  4. AAA - followed by vascular     Past Medical History:  Diagnosis Date  . AAA (abdominal aortic aneurysm) ([place])   . CAD (coronary artery disease)    a. s/p CABG in 1997 b. stent to SVG-PDA and PTCA of LAD in 2003 c. cath in 2015 showing patent SVG-OM1-OM2, SVG-RI, SVG-D1, and LIMA-LAD with occluded SVG-PDA and severe stenosis of mid-LAD at LIMA insertion with DES placed d. 04/2018: similar results to 2015 with patent LAD stent; occlusion of small caliber D1 stenosis with medical management recommended.   . Chronic back pain 07/25/2018   Chronic lumbar pain uses hydrocodone intermittently  . Chronic systolic CHF (congestive heart failure) ([place])   . COPD (chronic obstructive pulmonary disease) ([place])   . Diabetes mellitus     diet controlled  . Dyslipidemia   . Hypertension   . Myocardial infarction ([place]) 1997   stents x2 (2003/2007)     Allergies  Allergen Reactions  . Dye Fdc Red [Red Dye] Hives    hives  . Ivp Dye [Iodinated Diagnostic Agents] Hives  . Plavix [Clopidogrel] Hives    Unsure if dye or Plavix  . Keflex [Cephalexin] Other (See Comments)    hoarsness     Current Outpatient Medications  Medication Sig Dispense Refill  . ACCU-CHEK GUIDE test strip USE 1 STRIP TO CHECK GLUCOSE TWICE DAILY DUE  TO  FLUCTUATING  SUGARS 100 each 0  . Aloe Vera Juice LIQD Take 8 oz by mouth daily.    Marland Kitchen aspirin EC 81 MG tablet Take 1 tablet (81 mg total) by mouth daily. 90 tablet 3  . diphenhydrAMINE (BENYLIN) 12.5 MG/5ML syrup Take 20 mLs (50 mg total) by mouth once for 1 dose. Take 20 mL at 10am 5/6 (day of procedure) 120 mL 0  . fluticasone (FLONASE) 50 MCG/ACT nasal spray Place 1 spray into both nostrils daily for 14 days. 16 g 0  . HYDROcodone-acetaminophen (NORCO) 10-325 MG tablet Take one tablet po TID prn 60 tablet 0  . HYDROcodone-acetaminophen (NORCO) 10-325 MG tablet Take one tablet po TID prn 60 tablet 0  . HYDROcodone-acetaminophen (NORCO) 10-325 MG tablet Take one tablet po TID prn 60 tablet 0  . losartan (COZAAR) 25 MG tablet Take 1 tablet (25 mg total)  by mouth daily. 90 tablet 1  . metoprolol succinate (TOPROL-XL) 25 MG 24 hr tablet Take 1 tablet (25 mg total) by mouth daily. 90 tablet 0  . nitroGLYCERIN (NITROSTAT) 0.4 MG SL tablet Place 1 tablet (0.4 mg total) under the tongue every 5 (five) minutes as needed for chest pain. 30 tablet 3  . rosuvastatin (CRESTOR) 40 MG tablet Take 1 tablet by mouth once daily 30 tablet 3  . ticagrelor (BRILINTA) 90 MG TABS tablet Take 1 tablet (90 mg total) by mouth 2 (two) times daily. 60 tablet 11   No current facility-administered medications for this visit.     Past Surgical History:  Procedure Laterality Date  . CORONARY ANGIOPLASTY  2003/2007   2  stents  . CORONARY ANGIOPLASTY WITH STENT PLACEMENT  08/14/14   DES to LIMA-LAD insertion  . CORONARY ARTERY BYPASS GRAFT  1997   7 vessels  . CORONARY STENT INTERVENTION N/A 02/24/2020   Procedure: CORONARY STENT INTERVENTION;  Surgeon: Sherren Mocha, MD;  Location: [place] [facility];  Service: Cardiovascular;  Laterality: N/A;  . CORONARY STENT PLACEMENT  08/15/2014   MID LAD  DES  by Dr Burt Knack  . CORONARY/GRAFT ANGIOGRAPHY N/A 02/24/2020   Procedure: CORONARY/GRAFT ANGIOGRAPHY;  Surgeon: Sherren Mocha, MD;  Location: [place] [facility];  Service: Cardiovascular;  Laterality: N/A;  . CYSTECTOMY  2005   top of head-Jenkins  . EAR CYST EXCISION  07/09/2012   Procedure: CYST REMOVAL;  Surgeon: Jamesetta So, MD;  Location: [facility];  Service: General;  Laterality: N/A;  . LEFT HEART CATH AND CORS/GRAFTS ANGIOGRAPHY N/A 04/16/2018   Procedure: LEFT HEART CATH AND CORS/GRAFTS ANGIOGRAPHY;  Surgeon: Martinique, Peter M, MD;  Location: [place] [facility];  Service: Cardiovascular;  Laterality: N/A;  . LEFT HEART CATHETERIZATION WITH CORONARY/GRAFT ANGIOGRAM N/A 08/14/2014   Procedure: LEFT HEART CATHETERIZATION WITH Beatrix Fetters;  Surgeon: Blane Ohara, MD;  Location: [facility] LAB;  Service: Cardiovascular;  Laterality: N/A;     Allergies  Allergen Reactions  . Dye Fdc Red [Red Dye] Hives    hives  . Ivp Dye [Iodinated Diagnostic Agents] Hives  . Plavix [Clopidogrel] Hives    Unsure if dye or Plavix  . Keflex [Cephalexin] Other (See Comments)    hoarsness      Family History  Problem Relation Age of Onset  . Other Father        deceased after ?TCS or barium enema, age 57s  . Hypertension Father   . Heart attack Mother   . Colon cancer Neg Hx   . Liver disease Neg Hx      Social History Mr. Zingaro reports that he quit smoking about 20 years ago. His smoking use included cigarettes. He has a 120.00 pack-year smoking history. He has never used smokeless tobacco. Mr.  Lattin reports no history of alcohol use.   Review of Systems CONSTITUTIONAL: No weight loss, fever, chills, weakness or fatigue.  HEENT: Eyes: No visual loss, blurred vision, double vision or yellow sclerae.No hearing loss, sneezing, congestion, runny nose or sore throat.  SKIN: No rash or itching.  CARDIOVASCULAR: per hpi RESPIRATORY: No shortness of breath, cough or sputum.  GASTROINTESTINAL: No anorexia, nausea, vomiting or diarrhea. No abdominal pain or blood.  GENITOURINARY: No burning on urination, no polyuria NEUROLOGICAL: No headache, dizziness, syncope, paralysis, ataxia, numbness or tingling in the extremities. No change in bowel or bladder control.  MUSCULOSKELETAL: No muscle, back pain, joint pain or stiffness.  LYMPHATICS: No enlarged nodes. No history of splenectomy.  PSYCHIATRIC: No history of depression or anxiety.  ENDOCRINOLOGIC: No reports of sweating, cold or heat intolerance. No polyuria or polydipsia.  Marland Kitchen   Physical Examination Today's Vitals   02/09/21 1120  BP: (!) 158/82  Pulse: 66  SpO2: 98%  Weight: 178 lb 14.4 oz (81.1 kg)  Height: 6' (1.829 m)   Body mass index is 24.26 kg/m.  Gen: resting comfortably, no acute distress HEENT: no scleral icterus, pupils equal round and reactive, no palptable cervical adenopathy,  CV: RRR, no m/r//g, no jvd Resp: Clear to auscultation bilaterally GI: abdomen is soft, non-tender, non-distended, normal bowel sounds, no hepatosplenomegaly MSK: extremities are warm, no edema.  Skin: warm, no rash Neuro:  no focal deficits Psych: appropriate affect   Diagnostic Studies 02/2020 echo IMPRESSIONS    1. Left ventricular ejection fraction, by estimation, is 40 to 45%. The  left ventricle has mildly decreased function. The left ventricle  demonstrates regional wall motion abnormalities (see scoring  diagram/findings for description). There is mild  concentric left ventricular hypertrophy. Left ventricular  diastolic  parameters are consistent with Grade I diastolic dysfunction (impaired  relaxation).  2. Right ventricular systolic function is normal. The right ventricular  size is normal. Tricuspid regurgitation signal is inadequate for assessing  PA pressure.  3. The mitral valve is grossly normal. No evidence of mitral valve  regurgitation. No evidence of mitral stenosis.  4. The aortic valve is tricuspid. Aortic valve regurgitation is trivial.  Mild aortic valve sclerosis is present, with no evidence of aortic valve  stenosis.  5. Aortic dilatation noted. 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  6. The inferior vena cava is normal in size with greater than 50%  respiratory variability, suggesting right atrial pressure of 3 mmHg.   02/2020 cath 1. 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 2. S/P CABG with continued patency of the LIMA-LAD, sequential SVG-OM1 and OM2, and severe stenosis of the SVG-diagonal 3. Chronic occlusion of the SVG-PDA/PLA branches with left-to-right collaterals present 4. Successful SVG-PCI with stenting of the proximal and distal body of the SVG-diagonal, with transient no-reflow and severe small vessel distal disease.   Recommend aggressive medical therapy, minimum 12 months of ASA/ticagrelor consider long-term if tolerated    Assessment and Plan  1. CAD/ICM/Chronic systolic heart - no symptoms - given extensive CAD history and plavix allergy have elected for long term brillinta at lower dose of 60mg  bid now that he has completed 1 year of DAPT after last stent.  EKG today SR, chronic ST/T changes  2. Hyperlipidemia - continue statin, LDL at goal  3. HTN - manual recheck 124/75, at goal, continue current meds  F/u 6 months   Arnoldo Lenis, M.D.

## 2021-02-16 ENCOUNTER — Telehealth: Payer: Self-pay

## 2021-02-16 NOTE — Telephone Encounter (Signed)
Patient stated the referral to dermatology is for his wife- ERROR-

## 2021-02-16 NOTE — Telephone Encounter (Signed)
Pt wants to be referred to dermatology Dr Nevada Crane in [place] for something this week after lunch if possible   Mikyle [phone]

## 2021-02-16 NOTE — Telephone Encounter (Signed)
May have referral If urgent or emergent issue then marked referral as urgent Please give patient realistic expectations regarding referral process

## 2021-03-04 ENCOUNTER — Telehealth: Payer: Self-pay | Admitting: Cardiology

## 2021-03-04 NOTE — Telephone Encounter (Signed)
Confirmed patient is taking Brilinta 60 mg BID. Spoke to [employer] manager) who verbalized understanding. Medication filled 3 months with 3 refills.

## 2021-03-04 NOTE — Telephone Encounter (Signed)
Received telephone call from [facility] -pharmacist In regards to Brilinta  60 mg. Needs to verify dosage amount and if patient is to have refills.   SHIPPING # [id number] [place]   [phone]

## 2021-03-05 ENCOUNTER — Telehealth: Payer: Self-pay | Admitting: *Deleted

## 2021-03-05 NOTE — Telephone Encounter (Signed)
Received fax from [employer] that patient was approved for the patient assistance program.

## 2021-03-10 ENCOUNTER — Other Ambulatory Visit: Payer: Self-pay | Admitting: Family Medicine

## 2021-03-17 ENCOUNTER — Telehealth: Payer: Self-pay | Admitting: Family Medicine

## 2021-03-17 DIAGNOSIS — I714 Abdominal aortic aneurysm, without rupture, unspecified: Secondary | ICD-10-CM

## 2021-03-17 DIAGNOSIS — D229 Melanocytic nevi, unspecified: Secondary | ICD-10-CM

## 2021-03-17 NOTE — Telephone Encounter (Signed)
Patient is requesting a referral to Dr. Arnoldo Morale to get a mole removed and also wanting appointment at [facility] to get his aotrta checked. Please advise

## 2021-03-17 NOTE — Telephone Encounter (Signed)
Called pt because he asked for dr Arnoldo Morale to remove a mole instead of a dermatologist. He states dr Arnoldo Morale has done this before for him. Also pt last US aorta note states dr Donzetta Matters vascular surgery will follow him every 6 months. I asked pt if he was following up with dr Donzetta Matters and he said he wanted dr Nicki Reaper to follow this because he does not want to drive to [place] when he can go to [facility] to [facility] it and not have to leave [place] for as long. Pt aware dr Nicki Reaper is out of office til monday

## 2021-03-22 NOTE — Telephone Encounter (Signed)
[place] order placed and referral to Dr.Jenkins placed. Pt is aware and verbalized understanding.

## 2021-03-22 NOTE — Telephone Encounter (Signed)
May have referral to Dr. Arnoldo Morale Please go ahead and order ultrasound of aorta at [facility]

## 2021-04-01 ENCOUNTER — Telehealth: Payer: Self-pay

## 2021-04-01 NOTE — Telephone Encounter (Signed)
Metabolic 7, lipid, liver, A1c, hepatitis C antibody per CDC guideline Diabetes hyperlipidemia hypertension heart disease

## 2021-04-01 NOTE — Telephone Encounter (Signed)
Patient returned call for wife.  I told him about her lab orders and he said he has an appt on the same day and would like his lab orders put in as well.  No need to call him back.  He knows to go to [facility] about a week prior to their appt to get the labwork done.  I told him to be fasting just in case.

## 2021-04-02 ENCOUNTER — Other Ambulatory Visit (HOSPITAL_COMMUNITY): Payer: Medicare Other

## 2021-04-02 ENCOUNTER — Other Ambulatory Visit: Payer: Self-pay

## 2021-04-02 DIAGNOSIS — E785 Hyperlipidemia, unspecified: Secondary | ICD-10-CM

## 2021-04-02 DIAGNOSIS — E1142 Type 2 diabetes mellitus with diabetic polyneuropathy: Secondary | ICD-10-CM

## 2021-04-02 DIAGNOSIS — N1831 Chronic kidney disease, stage 3a: Secondary | ICD-10-CM

## 2021-04-02 DIAGNOSIS — I214 Non-ST elevation (NSTEMI) myocardial infarction: Secondary | ICD-10-CM

## 2021-04-02 DIAGNOSIS — I10 Essential (primary) hypertension: Secondary | ICD-10-CM

## 2021-04-07 ENCOUNTER — Other Ambulatory Visit: Payer: Self-pay | Admitting: Family Medicine

## 2021-04-07 ENCOUNTER — Ambulatory Visit (HOSPITAL_COMMUNITY)
Admission: RE | Admit: 2021-04-07 | Discharge: 2021-04-07 | Disposition: A | Discharge status: Home / Self Care | Payer: Medicare Other | Source: Ambulatory Visit | Attending: Family Medicine | Admitting: Family Medicine

## 2021-04-07 DIAGNOSIS — I714 Abdominal aortic aneurysm, without rupture: Secondary | ICD-10-CM | POA: Insufficient documentation

## 2021-04-09 DIAGNOSIS — I10 Essential (primary) hypertension: Secondary | ICD-10-CM | POA: Diagnosis not present

## 2021-04-09 DIAGNOSIS — I214 Non-ST elevation (NSTEMI) myocardial infarction: Secondary | ICD-10-CM | POA: Diagnosis not present

## 2021-04-09 DIAGNOSIS — N1831 Chronic kidney disease, stage 3a: Secondary | ICD-10-CM | POA: Diagnosis not present

## 2021-04-09 DIAGNOSIS — E1142 Type 2 diabetes mellitus with diabetic polyneuropathy: Secondary | ICD-10-CM | POA: Diagnosis not present

## 2021-04-09 DIAGNOSIS — E785 Hyperlipidemia, unspecified: Secondary | ICD-10-CM | POA: Diagnosis not present

## 2021-04-10 LAB — CBC WITH DIFFERENTIAL/PLATELET
Basophils Absolute: 0.1 10*3/uL (ref 0.0–0.2)
Basos: 1 %
EOS (ABSOLUTE): 0.9 10*3/uL — ABNORMAL HIGH (ref 0.0–0.4)
Eos: 8 %
Hematocrit: 38.8 % (ref 37.5–51.0)
Hemoglobin: 13.1 g/dL (ref 13.0–17.7)
Immature Grans (Abs): 0 10*3/uL (ref 0.0–0.1)
Immature Granulocytes: 0 %
Lymphocytes Absolute: 4.9 10*3/uL — ABNORMAL HIGH (ref 0.7–3.1)
Lymphs: 45 %
MCH: 31.9 pg (ref 26.6–33.0)
MCHC: 33.8 g/dL (ref 31.5–35.7)
MCV: 94 fL (ref 79–97)
Monocytes Absolute: 0.7 10*3/uL (ref 0.1–0.9)
Monocytes: 7 %
Neutrophils Absolute: 4.3 10*3/uL (ref 1.4–7.0)
Neutrophils: 39 %
Platelets: 119 10*3/uL — ABNORMAL LOW (ref 150–450)
RBC: 4.11 x10E6/uL — ABNORMAL LOW (ref 4.14–5.80)
RDW: 12.2 % (ref 11.6–15.4)
WBC: 11 10*3/uL — ABNORMAL HIGH (ref 3.4–10.8)

## 2021-04-10 LAB — HEPATIC FUNCTION PANEL
ALT: 18 IU/L (ref 0–44)
AST: 26 IU/L (ref 0–40)
Albumin: 4.7 g/dL (ref 3.7–4.7)
Alkaline Phosphatase: 85 IU/L (ref 44–121)
Bilirubin Total: 0.7 mg/dL (ref 0.0–1.2)
Bilirubin, Direct: 0.22 mg/dL (ref 0.00–0.40)
Total Protein: 7.6 g/dL (ref 6.0–8.5)

## 2021-04-10 LAB — LIPID PANEL
Chol/HDL Ratio: 2.6 ratio (ref 0.0–5.0)
Cholesterol, Total: 116 mg/dL (ref 100–199)
HDL: 44 mg/dL (ref >39)
LDL Chol Calc (NIH): 56 mg/dL (ref 0–99)
Triglycerides: 83 mg/dL (ref 0–149)
VLDL Cholesterol Cal: 16 mg/dL (ref 5–40)

## 2021-04-10 LAB — BASIC METABOLIC PANEL (7)
BUN/Creatinine Ratio: 9 — ABNORMAL LOW (ref 10–24)
BUN: 15 mg/dL (ref 8–27)
CO2: 20 mmol/L (ref 20–29)
Chloride: 101 mmol/L (ref 96–106)
Creatinine, Ser: 1.66 mg/dL — ABNORMAL HIGH (ref 0.76–1.27)
Glucose: 114 mg/dL — ABNORMAL HIGH (ref 65–99)
Potassium: 3.7 mmol/L (ref 3.5–5.2)
Sodium: 140 mmol/L (ref 134–144)
eGFR: 42 mL/min/{1.73_m2} — ABNORMAL LOW (ref >59)

## 2021-04-10 LAB — HEMOGLOBIN A1C
Est. average glucose Bld gHb Est-mCnc: 140 mg/dL
Hgb A1c MFr Bld: 6.5 % — ABNORMAL HIGH (ref 4.8–5.6)

## 2021-04-11 ENCOUNTER — Encounter: Payer: Self-pay | Admitting: Family Medicine

## 2021-04-20 ENCOUNTER — Ambulatory Visit: Payer: Medicare Other | Admitting: General Surgery

## 2021-04-27 ENCOUNTER — Encounter: Payer: Self-pay | Admitting: General Surgery

## 2021-04-27 ENCOUNTER — Other Ambulatory Visit: Payer: Self-pay

## 2021-04-27 ENCOUNTER — Ambulatory Visit: Payer: Medicare Other | Admitting: General Surgery

## 2021-04-27 VITALS — BP 177/75 | HR 65 | Temp 98.4°F | Resp 14 | Ht 72.0 in | Wt 176.0 lb

## 2021-04-27 DIAGNOSIS — D225 Melanocytic nevi of trunk: Secondary | ICD-10-CM

## 2021-04-28 NOTE — Progress Notes (Signed)
Peter [name]; [id number]; 05/09/1941   HPI Patient is a 79 year old white male who was referred to my care by Sallee Lange for evaluation and treatment of a skin nevus on the left flank.  He states is been present for many years, but is starting to irritate him.  He has had it evaluated in the past and was told it was a benign nevus.  He has multiple medical problems and is anticoagulated on Brilinta. Past Medical History:  Diagnosis Date  . AAA (abdominal aortic aneurysm) ([place])   . CAD (coronary artery disease)    a. s/p CABG in 1997 b. stent to SVG-PDA and PTCA of LAD in 2003 c. cath in 2015 showing patent SVG-OM1-OM2, SVG-RI, SVG-D1, and LIMA-LAD with occluded SVG-PDA and severe stenosis of mid-LAD at LIMA insertion with DES placed d. 04/2018: similar results to 2015 with patent LAD stent; occlusion of small caliber D1 stenosis with medical management recommended.   . Chronic back pain 07/25/2018   Chronic lumbar pain uses hydrocodone intermittently  . Chronic systolic CHF (congestive heart failure) ([place])   . COPD (chronic obstructive pulmonary disease) ([place])   . Diabetes mellitus    diet controlled  . Dyslipidemia   . Hypertension   . Myocardial infarction [facility]) 1997   stents x2 (2003/2007)    Past Surgical History:  Procedure Laterality Date  . CORONARY ANGIOPLASTY  2003/2007   2 stents  . CORONARY ANGIOPLASTY WITH STENT PLACEMENT  08/14/14   DES to LIMA-LAD insertion  . CORONARY ARTERY BYPASS GRAFT  1997   7 vessels  . CORONARY STENT INTERVENTION N/A 02/24/2020   Procedure: CORONARY STENT INTERVENTION;  Surgeon: Sherren Mocha, MD;  Location: [place] [facility];  Service: Cardiovascular;  Laterality: N/A;  . CORONARY STENT PLACEMENT  08/15/2014   MID LAD  DES  by Dr Burt Knack  . CORONARY/GRAFT ANGIOGRAPHY N/A 02/24/2020   Procedure: CORONARY/GRAFT ANGIOGRAPHY;  Surgeon: Sherren Mocha, MD;  Location: [place] [facility];  Service: Cardiovascular;  Laterality: N/A;  . CYSTECTOMY   2005   top of head-Sheikh Leverich  . EAR CYST EXCISION  07/09/2012   Procedure: CYST REMOVAL;  Surgeon: Jamesetta So, MD;  Location: [facility];  Service: General;  Laterality: N/A;  . LEFT HEART CATH AND CORS/GRAFTS ANGIOGRAPHY N/A 04/16/2018   Procedure: LEFT HEART CATH AND CORS/GRAFTS ANGIOGRAPHY;  Surgeon: Martinique, Peter M, MD;  Location: [place];  Service: Cardiovascular;  Laterality: N/A;  . LEFT HEART CATHETERIZATION WITH CORONARY/GRAFT ANGIOGRAM N/A 08/14/2014   Procedure: LEFT HEART CATHETERIZATION WITH Beatrix Fetters;  Surgeon: Blane Ohara, MD;  Location: [facility] CATH LAB;  Service: Cardiovascular;  Laterality: N/A;    Family History  Problem Relation Age of Onset  . Other Father        deceased after ?TCS or barium enema, age 33s  . Hypertension Father   . Heart attack Mother   . Colon cancer Neg Hx   . Liver disease Neg Hx     Current Outpatient Medications on File Prior to Visit  Medication Sig Dispense Refill  . Aloe Vera Juice LIQD Take 8 oz by mouth daily.    Marland Kitchen aspirin EC 81 MG tablet Take 1 tablet (81 mg total) by mouth daily. 90 tablet 3  . glucose blood (ACCU-CHEK GUIDE) test strip USE STRIPS TWICE DAILY TO CHECK BLOOD SUGARS DUE  TO  FLUCTUATING  SUGARS 100 each 0  . HYDROcodone-acetaminophen (NORCO) 10-325 MG tablet TAKE ONE TABLET BY MOUTH 3  TIMES DAILY AS NEEDED 60 tablet 0  . HYDROcodone-acetaminophen (NORCO) 10-325 MG tablet TAKE ONE TABLET BY MOUTH 3 TIMES DAILY AS NEEDED MAY FILL 01/02/2021 60 tablet 0  . HYDROcodone-acetaminophen (NORCO) 10-325 MG tablet TAKE ONE TABLET BY MOUTH 3 TIMES DAILY AS NEEDED MAY FILL 02/02/2021 60 tablet 0  . losartan (COZAAR) 25 MG tablet Take 1 tablet (25 mg total) by mouth daily. 90 tablet 1  . metoprolol succinate (TOPROL-XL) 25 MG 24 hr tablet Take 1 tablet by mouth once daily 90 tablet 0  . nitroGLYCERIN (NITROSTAT) 0.4 MG SL tablet Place 1 tablet (0.4 mg total) under the tongue every 5 (five) minutes as needed for chest  pain. 30 tablet 3  . rosuvastatin (CRESTOR) 40 MG tablet Take 1 tablet by mouth once daily 30 tablet 0  . ticagrelor (BRILINTA) 60 MG TABS tablet Take 1 tablet (60 mg total) by mouth 2 (two) times daily. 60 tablet 11   No current facility-administered medications on file prior to visit.    Allergies  Allergen Reactions  . Dye Fdc Red [Red Dye] Hives    hives  . Ivp Dye [Iodinated Diagnostic Agents] Hives  . Plavix [Clopidogrel] Hives    Unsure if dye or Plavix  . Keflex [Cephalexin] Other (See Comments)    hoarsness    Social History   Substance and Sexual Activity  Alcohol Use No  . Alcohol/week: 10.0 standard drinks  . Types: 10 Cans of beer per week   Comment: quit 3 months (01/2013). 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    Social History   Tobacco Use  Smoking Status Former Smoker  . Packs/day: 3.00  . Years: 40.00  . Pack years: 120.00  . Types: Cigarettes  . Quit date: 07/02/2000  . Years since quitting: 20.8  Smokeless Tobacco Never Used    Review of Systems  Constitutional: Negative.   HENT: Positive for sinus pain.   Eyes: Negative.   Respiratory: Negative.   Cardiovascular: Negative.   Gastrointestinal: Negative.   Genitourinary: Negative.   Musculoskeletal: Negative.   Skin: Negative.   Neurological: Negative.   Endo/Heme/Allergies: Negative.   Psychiatric/Behavioral: Negative.     Objective   Vitals:   04/27/21 1431  BP: (!) 177/75  Pulse: 65  Resp: 14  Temp: 98.4 F (36.9 C)  SpO2: 97%    Physical Exam Vitals reviewed.  Constitutional:      Appearance: Normal appearance. He is not ill-appearing.  HENT:     Head: Normocephalic and atraumatic.  Cardiovascular:     Rate and Rhythm: Normal rate and regular rhythm.     Heart sounds: Normal heart sounds. No murmur heard. No friction rub. No gallop.   Pulmonary:     Effort: Pulmonary effort is normal. No respiratory distress.     Breath sounds: Normal breath  sounds. No stridor. No wheezing, rhonchi or rales.  Skin:    General: Skin is warm and dry.     Findings: Lesion present.     Comments: A three quarters of a centimeter raised well-circumscribed dark lesion is present on the left flank.  ?satellite lesions noted.  Somewhat crusty in nature.  Neurological:     Mental Status: He is alert and oriented to person, place, and time.     Media Information         Document Information  Photos    04/27/2021 14:40  Attached To:  Office Visit on 04/27/21 with Aviva Signs,  MD   Source Information  Aviva Signs, MD  [facility]    Assessment  Skin nevus, left flank Multiple comorbidities, chronically anticoagulated Plan   I told the patient he needs to see a dermatologist as I did not have the equipment in the office to effectively remove this.  He was also told to tell the dermatologist about his anticoagulation.  I stressed to him that this does need to be removed and evaluated by pathology.  He understands and agrees.  Follow-up here as needed.

## 2021-04-30 ENCOUNTER — Other Ambulatory Visit (HOSPITAL_COMMUNITY): Payer: Self-pay

## 2021-05-04 ENCOUNTER — Other Ambulatory Visit: Payer: Self-pay | Admitting: Family Medicine

## 2021-05-04 ENCOUNTER — Telehealth: Payer: Self-pay | Admitting: Family Medicine

## 2021-05-04 NOTE — Telephone Encounter (Signed)
Attempted to call patient, but patient wasn't home. If patient returns my call, please schedule Medicare Annual Wellness Visit (AWV) in office.   If not able to come in office, please offer to do virtually or by telephone.   Last AWV:03/02/2016  Please schedule at anytime with Nurse Health Advisor.

## 2021-05-05 ENCOUNTER — Other Ambulatory Visit: Payer: Self-pay | Admitting: Family Medicine

## 2021-05-12 ENCOUNTER — Ambulatory Visit: Payer: Medicare Other | Admitting: Family Medicine

## 2021-05-12 ENCOUNTER — Telehealth: Payer: Self-pay | Admitting: Cardiology

## 2021-05-12 MED ORDER — LOSARTAN POTASSIUM 25 MG PO TABS: 25.0000 mg | ORAL_TABLET | Freq: Every day | ORAL | 3 refills | Status: DC

## 2021-05-12 NOTE — Telephone Encounter (Signed)
*  STAT* If patient is at the pharmacy, call can be transferred to refill team.   1. Which medications need to be refilled? (please list name of each medication and dose if known) Losatan  2. Which pharmacy/location (including street and city if local pharmacy) is medication to be sent to? [facility] [place]  3. Do they need a 30 day or 90 day supply? [place]

## 2021-05-12 NOTE — Telephone Encounter (Signed)
Medication refill request for Losartan 25 mg tablets approved and sent to [facility].

## 2021-05-20 ENCOUNTER — Other Ambulatory Visit: Payer: Self-pay

## 2021-05-20 ENCOUNTER — Ambulatory Visit (INDEPENDENT_AMBULATORY_CARE_PROVIDER_SITE_OTHER): Payer: Medicare Other | Admitting: Family Medicine

## 2021-05-20 ENCOUNTER — Encounter: Payer: Self-pay | Admitting: Family Medicine

## 2021-05-20 VITALS — BP 130/72 | HR 67 | Temp 97.9°F | Ht 72.0 in | Wt 177.0 lb

## 2021-05-20 DIAGNOSIS — E1142 Type 2 diabetes mellitus with diabetic polyneuropathy: Secondary | ICD-10-CM

## 2021-05-20 DIAGNOSIS — E785 Hyperlipidemia, unspecified: Secondary | ICD-10-CM

## 2021-05-20 DIAGNOSIS — I714 Abdominal aortic aneurysm, without rupture, unspecified: Secondary | ICD-10-CM

## 2021-05-20 DIAGNOSIS — R252 Cramp and spasm: Secondary | ICD-10-CM

## 2021-05-20 DIAGNOSIS — E1169 Type 2 diabetes mellitus with other specified complication: Secondary | ICD-10-CM

## 2021-05-20 DIAGNOSIS — N289 Disorder of kidney and ureter, unspecified: Secondary | ICD-10-CM

## 2021-05-20 DIAGNOSIS — D72829 Elevated white blood cell count, unspecified: Secondary | ICD-10-CM

## 2021-05-20 DIAGNOSIS — I10 Essential (primary) hypertension: Secondary | ICD-10-CM

## 2021-05-20 MED ORDER — HYDROCODONE-ACETAMINOPHEN 5-325 MG PO TABS
ORAL_TABLET | ORAL | 0 refills | Status: DC
  Filled 2021-05-20: qty 60, fill #0

## 2021-05-20 MED ORDER — ROSUVASTATIN CALCIUM 40 MG PO TABS: 40.0000 mg | ORAL_TABLET | Freq: Every day | ORAL | 1 refills | Status: DC

## 2021-05-20 MED ORDER — HYDROCODONE-ACETAMINOPHEN 5-325 MG PO TABS
ORAL_TABLET | ORAL | 0 refills | Status: DC
  Filled 2021-05-20: qty 60, fill #0
  Filled 2021-05-25: qty 60, 30d supply, fill #0

## 2021-05-20 NOTE — Progress Notes (Signed)
Subjective:    Patient ID: Peter [name], male    DOB: 29-Jan-1941, 80 y.o.   MRN: [id number]  Hypertension  Diabetes  Results for orders placed or performed in visit on 32/20/25  Basic Metabolic Panel (7)  Result Value Ref Range   Glucose 114 (H) 65 - 99 mg/dL   BUN 15 8 - 27 mg/dL   Creatinine, Ser 1.66 (H) 0.76 - 1.27 mg/dL   eGFR 42 (L) >59 mL/min/1.73   BUN/Creatinine Ratio 9 (L) 10 - 24   Sodium 140 134 - 144 mmol/L   Potassium 3.7 3.5 - 5.2 mmol/L   Chloride 101 96 - 106 mmol/L   CO2 20 20 - 29 mmol/L  CBC with Differential/Platelet  Result Value Ref Range   WBC 11.0 (H) 3.4 - 10.8 x10E3/uL   RBC 4.11 (L) 4.14 - 5.80 x10E6/uL   Hemoglobin 13.1 13.0 - 17.7 g/dL   Hematocrit 38.8 37.5 - 51.0 %   MCV 94 79 - 97 fL   MCH 31.9 26.6 - 33.0 pg   MCHC 33.8 31.5 - 35.7 g/dL   RDW 12.2 11.6 - 15.4 %   Platelets 119 (L) 150 - 450 x10E3/uL   Neutrophils 39 Not Estab. %   Lymphs 45 Not Estab. %   Monocytes 7 Not Estab. %   Eos 8 Not Estab. %   Basos 1 Not Estab. %   Neutrophils Absolute 4.3 1.4 - 7.0 x10E3/uL   Lymphocytes Absolute 4.9 (H) 0.7 - 3.1 x10E3/uL   Monocytes Absolute 0.7 0.1 - 0.9 x10E3/uL   EOS (ABSOLUTE) 0.9 (H) 0.0 - 0.4 x10E3/uL   Basophils Absolute 0.1 0.0 - 0.2 x10E3/uL   Immature Granulocytes 0 Not Estab. %   Immature Grans (Abs) 0.0 0.0 - 0.1 x10E3/uL  Lipid panel  Result Value Ref Range   Cholesterol, Total 116 100 - 199 mg/dL   Triglycerides 83 0 - 149 mg/dL   HDL 44 >39 mg/dL   VLDL Cholesterol Cal 16 5 - 40 mg/dL   LDL Chol Calc (NIH) 56 0 - 99 mg/dL   Chol/HDL Ratio 2.6 0.0 - 5.0 ratio  Hepatic function panel  Result Value Ref Range   Total Protein 7.6 6.0 - 8.5 g/dL   Albumin 4.7 3.7 - 4.7 g/dL   Bilirubin Total 0.7 0.0 - 1.2 mg/dL   Bilirubin, Direct 0.22 0.00 - 0.40 mg/dL   Alkaline Phosphatase 85 44 - 121 IU/L   AST 26 0 - 40 IU/L   ALT 18 0 - 44 IU/L  Hemoglobin A1c  Result Value Ref Range   Hgb A1c MFr Bld 6.5 (H) 4.8 - 5.6 %    Est. average glucose Bld gHb Est-mCnc 140 mg/dL   This patient was seen today for chronic pain  The medication list was reviewed and updated.  Location of Pain for which the patient has been treated with regarding narcotics: Lumbar spine  Onset of this pain: This been going on for over 20 years   -Compliance with medication: Uses the pain medicine intermittently not every single day  - Number patient states they take daily: No more than 2-day  -when was the last dose patient took?  A couple days ago  The patient was advised the importance of maintaining medication and not using illegal substances with these.  Here for refills and follow up  The patient was educated that we can provide 3 monthly scripts for their medication, it is their responsibility to follow the instructions.  Side effects or complications from medications: Denies side effects of the medicine  Patient is aware that pain medications are meant to minimize the severity of the pain to allow their pain levels to improve to allow for better function. They are aware of that pain medications cannot totally remove their pain.  Due for UDT ( at least once per year) : We will hold off on ADT today  Scale of 1 to 10 ( 1 is least 10 is most) Your pain level without the medicine: 7 Your pain level with medication 3  Scale 1 to 10 ( 1-helps very little, 10 helps very well) How well does your pain medication reduce your pain so you can function better through out the day?  8  Quality of the pain: Burning throbbing aching  Persistence of the pain: Present all the time  Modifying factors: Worse with certain movement The patient prefers to reduce his pain medicine to a lower dose we discussed this in detail.  HTN (hypertension), benign - Plan: Magnesium, Basic metabolic panel  AAA (abdominal aortic aneurysm) without rupture (HCC)  Type 2 diabetes mellitus with peripheral neuropathy ([place]) - Plan: Magnesium, Basic  metabolic panel  Renal insufficiency - Plan: Magnesium, Basic metabolic panel  Hyperlipidemia associated with type 2 diabetes mellitus ([place]) - Plan: Magnesium, Basic metabolic panel  Muscle cramps - Plan: Magnesium, Basic metabolic panel  Leukocytosis, unspecified type  Please see below       Review of Systems     Objective:   Physical Exam  General-in no acute distress Eyes-no discharge Lungs-respiratory rate normal, CTA CV-no murmurs,RRR Extremities skin warm dry no edema Neuro grossly normal Behavior normal, alert       Assessment & Plan:  1. HTN (hypertension), benign Blood pressure decent control continue current measures watch salt stay active - Magnesium - Basic metabolic panel  2. AAA (abdominal aortic aneurysm) without rupture (HCC) Recent aneurysm ultrasound showed stable repeat this again in November patient prefers to follow through with [place] regarding this if the diameter gets above 5.0 we will refer back to vascular surgery  3. Type 2 diabetes mellitus with peripheral neuropathy (HCC) Diabetes very good control continue current measures avoid low sugars warning signs discussed - Magnesium - Basic metabolic panel  4. Renal insufficiency Patient is to be well-hydrated before his next visit to the lab so that kidney function is more reflective of his true value - Magnesium - Basic metabolic panel  5. Hyperlipidemia associated with type 2 diabetes mellitus ([place]) Current".  Continue medication. - Magnesium - Basic metabolic panel  6. Muscle cramps Stretching exercises were shown check magnesium - Magnesium - Basic metabolic panel  7. Leukocytosis, unspecified type Mild leukocytosis on recent CBC no need to repeat right away but will follow-up in 6 months  The patient was seen in followup for chronic pain. A review over at their current pain status was discussed. Drug registry was checked. Prescriptions were given.  Regular follow-up  recommended. Discussion was held regarding the importance of compliance with medication as well as pain medication contract.  Patient was informed that medication may cause drowsiness and should not be combined  with other medications/alcohol or street drugs. If the patient feels medication is causing altered alertness then do not drive or operate dangerous equipment. Patient states the pain medicine does help him with his back pain but he is interested in tapering down we will Wean down to 5 mg hydrocodone twice daily maximum daily prescription sent and  follow-up in 3 months

## 2021-05-21 ENCOUNTER — Other Ambulatory Visit (HOSPITAL_COMMUNITY): Payer: Self-pay

## 2021-05-25 ENCOUNTER — Other Ambulatory Visit (HOSPITAL_COMMUNITY): Payer: Self-pay

## 2021-07-07 ENCOUNTER — Telehealth: Payer: Self-pay | Admitting: Family Medicine

## 2021-07-07 ENCOUNTER — Other Ambulatory Visit (HOSPITAL_BASED_OUTPATIENT_CLINIC_OR_DEPARTMENT_OTHER): Payer: Self-pay

## 2021-07-07 ENCOUNTER — Other Ambulatory Visit: Payer: Self-pay | Admitting: Family Medicine

## 2021-07-07 MED ORDER — HYDROCODONE-ACETAMINOPHEN 5-325 MG PO TABS
ORAL_TABLET | ORAL | 0 refills | Status: DC
  Filled 2021-07-07: qty 60, 30d supply, fill #0

## 2021-07-07 NOTE — Telephone Encounter (Signed)
FYI- patient controlled medication has been switched to [facility] because the one in [place] closed.

## 2021-07-21 ENCOUNTER — Telehealth: Payer: Self-pay | Admitting: Cardiology

## 2021-07-21 MED ORDER — TICAGRELOR 60 MG PO TABS: 60.0000 mg | ORAL_TABLET | Freq: Two times a day (BID) | ORAL | 11 refills | Status: DC

## 2021-07-21 NOTE — Telephone Encounter (Signed)
Patient states medication is suppose to be mailed to his home but he has not received it yet.  He has been out of medication for 2 days. Can call a script in to [facility] in [place] but wants to know where his mail order is.

## 2021-07-21 NOTE — Telephone Encounter (Signed)
Refill sent to [facility].

## 2021-07-22 NOTE — Telephone Encounter (Signed)
Pt provided with 2 weeks of samples of Brilinta 60 mg. Lot # [id number], Exp: 02/2023. Samples placed at front desk for pick up.

## 2021-07-22 NOTE — Telephone Encounter (Signed)
Spoke with Christina at [facility]. Who states that application with processed to early. She will redo application approval and expedite shipping on medication.

## 2021-07-26 ENCOUNTER — Other Ambulatory Visit: Payer: Self-pay | Admitting: Family Medicine

## 2021-08-13 ENCOUNTER — Ambulatory Visit: Payer: Medicare Other | Admitting: Cardiology

## 2021-08-16 ENCOUNTER — Other Ambulatory Visit: Payer: Self-pay | Admitting: Family Medicine

## 2021-08-17 ENCOUNTER — Ambulatory Visit (INDEPENDENT_AMBULATORY_CARE_PROVIDER_SITE_OTHER): Payer: Medicare Other

## 2021-08-17 ENCOUNTER — Other Ambulatory Visit: Payer: Self-pay

## 2021-08-17 VITALS — Ht 71.5 in | Wt 177.0 lb

## 2021-08-17 DIAGNOSIS — Z0000 Encounter for general adult medical examination without abnormal findings: Secondary | ICD-10-CM | POA: Diagnosis not present

## 2021-08-17 NOTE — Progress Notes (Signed)
Subjective:   Peter [name] is a 80 y.o. male who presents for Medicare Annual/Subsequent preventive examination. Virtual Visit via Telephone Note  I connected with  Peter [name] on 08/17/21 at  1:00 PM EDT by telephone and verified that I am speaking with the correct person using two identifiers.  Location: Patient: Home Provider: [facility] Persons participating in the virtual visit: patient/Nurse Health Advisor   I discussed the limitations, risks, security and privacy concerns of performing an evaluation and management service by telephone and the availability of in person appointments. The patient expressed understanding and agreed to proceed.  Interactive audio and video telecommunications were attempted between this nurse and patient, however failed, due to patient having technical difficulties OR patient did not have access to video capability.  We continued and completed visit with audio only.  Some vital signs may be absent or patient reported.   Peter Driver, LPN  Review of Systems    Cardiac Risk Factors include: advanced age (>43mn, >>28women);diabetes mellitus;hypertension;dyslipidemia;male gender;obesity (BMI >30kg/m2);sedentary lifestyle     Objective:    Today's Vitals   08/17/21 1300  Weight: 177 lb (80.3 kg)  Height: 5' 11.5" (1.816 m)   Body mass index is 24.34 kg/m.  Advanced Directives 08/17/2021 02/26/2020 02/24/2020 04/12/2018 04/28/2017 04/09/2015 04/06/2015  Does Patient Have a Medical Advance Directive? No Yes No No Yes No (No Data)  Type of Advance Directive - HRandolphLiving will - -  Does patient want to make changes to medical advance directive? - No - Guardian declined - - - - -  Would patient like information on creating a medical advance directive? No - Patient declined No - Patient declined - Yes (Inpatient - patient defers creating a medical advance directive at this time) - - -  Pre-existing  out of facility DNR order (yellow form or pink MOST form) - - - - - - -    [name] Medications (verified) Outpatient Encounter Medications as of 08/17/2021  Medication Sig   Aloe Vera Juice LIQD Take 8 oz by mouth daily.   aspirin EC 81 MG tablet Take 1 tablet (81 mg total) by mouth daily.   glucose blood (ACCU-CHEK GUIDE) test strip USE 1 STRIP TWICE DAILY TO CHECK GLUCOSE DUE  TO  FLUCTUATING  SUGARS   HYDROcodone-acetaminophen (NORCO/VICODIN) 5-325 MG tablet Take 1 tablet by mouth twice daily as needed for pain caution drowsiness May fill 07/18/2021   HYDROcodone-acetaminophen (NORCO/VICODIN) 5-325 MG tablet Take 1 tablet by mouth twice daily as needed pain caution drowsiness  May fill 06/18/2021   HYDROcodone-acetaminophen (NORCO/VICODIN) 5-325 MG tablet Take 1 tablet by mouth twice daily as needed for pain, caution drowsiness   losartan (COZAAR) 25 MG tablet Take 1 tablet (25 mg total) by mouth daily.   metoprolol succinate (TOPROL-XL) 25 MG 24 hr tablet Take 1 tablet by mouth once daily   nitroGLYCERIN (NITROSTAT) 0.4 MG SL tablet Place 1 tablet (0.4 mg total) under the tongue every 5 (five) minutes as needed for chest pain.   rosuvastatin (CRESTOR) 40 MG tablet Take 1 tablet (40 mg total) by mouth daily.   ticagrelor (BRILINTA) 60 MG TABS tablet Take 1 tablet (60 mg total) by mouth 2 (two) times daily.   No facility-administered encounter medications on file as of 08/17/2021.    Allergies (verified) Dye fdc red [red dye], Ivp dye [iodinated diagnostic agents], Plavix [clopidogrel], and Keflex [cephalexin]   History: Past  Medical History:  Diagnosis Date   AAA (abdominal aortic aneurysm) (HCC)    CAD (coronary artery disease)    a. s/p CABG in 1997 b. stent to SVG-PDA and PTCA of LAD in 2003 c. cath in 2015 showing patent SVG-OM1-OM2, SVG-RI, SVG-D1, and LIMA-LAD with occluded SVG-PDA and severe stenosis of mid-LAD at LIMA insertion with DES placed d. 04/2018: similar results to 2015  with patent LAD stent; occlusion of small caliber D1 stenosis with medical management recommended.    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   stents x2 (2003/2007)   Past Surgical History:  Procedure Laterality Date   CORONARY ANGIOPLASTY  2003/2007   2 stents   CORONARY ANGIOPLASTY WITH STENT PLACEMENT  08/14/14   DES to LIMA-LAD insertion   CORONARY ARTERY BYPASS GRAFT  1997   7 vessels   CORONARY STENT INTERVENTION N/A 02/24/2020   Procedure: CORONARY STENT INTERVENTION;  Surgeon: Sherren Mocha, MD;  Location: [facility];  Service: Cardiovascular;  Laterality: N/A;   CORONARY STENT PLACEMENT  08/15/2014   MID LAD  DES  by Dr Leslye Peer ANGIOGRAPHY N/A 02/24/2020   Procedure: Remus Blake ANGIOGRAPHY;  Surgeon: Sherren Mocha, MD;  Location: [facility];  Service: Cardiovascular;  Laterality: N/A;   CYSTECTOMY  2005   top of head-[place]   EAR CYST EXCISION  07/09/2012   Procedure: CYST REMOVAL;  Surgeon: Jamesetta So, MD;  Location: [facility];  Service: General;  Laterality: N/A;   LEFT HEART CATH AND CORS/GRAFTS ANGIOGRAPHY N/A 04/16/2018   Procedure: LEFT HEART CATH AND CORS/GRAFTS ANGIOGRAPHY;  Surgeon: Martinique, Peter M, MD;  Location: [facility];  Service: Cardiovascular;  Laterality: N/A;   LEFT HEART CATHETERIZATION WITH CORONARY/GRAFT ANGIOGRAM N/A 08/14/2014   Procedure: LEFT HEART CATHETERIZATION WITH Beatrix Fetters;  Surgeon: Blane Ohara, MD;  Location[facility];  Service: Cardiovascular;  Laterality: N/A;   Family History  Problem Relation Age of Onset   Other Father        deceased after ?TCS or barium enema, age 48s   Hypertension Father    Heart attack  Mother    Colon cancer Neg Hx    Liver disease Neg Hx    Social History   Socioeconomic History   Marital status: Married    Spouse name: Not on file   Number of children: Not on file   Years of education: Not on file   Highest education level: Not on file  Occupational History   Not on file  Tobacco Use   Smoking status: Former    Packs/day: 3.00    Years: 40.00    Pack years: 120.00    Types: Cigarettes    Quit date: 07/02/2000    Years since quitting: 21.1   Smokeless tobacco: Never  Vaping Use   Vaping Use: Never used  Substance and Sexual Activity   Alcohol use: No    Alcohol/week: 10.0 standard drinks    Types: 10 Cans of beer per week    Comment: quit 3 months (01/2013). 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   Drug use: No   Sexual activity: Not Currently  Birth control/protection: None  Other Topics Concern   Not on file  Social History Narrative   Married x 62 years. Cares for wife at home. Family assists.   Social Determinants of Health   Financial Resource Strain: Low Risk    Difficulty of Paying Living Expenses: Not hard at all  Food Insecurity: No Food Insecurity   Worried About Charity fundraiser in the Last Year: Never true   [place] in the Last Year: Never true  Transportation Needs: No Transportation Needs   Lack of Transportation (Medical): No   Lack of Transportation (Non-Medical): No  Physical Activity: Sufficiently Active   Days of Exercise per Week: 5 days   Minutes of Exercise per Session: 30 min  Stress: No Stress Concern Present   Feeling of Stress : Not at all  Social Connections: Moderately Isolated   Frequency of Communication with Friends and Family: Three times a week   Frequency of Social Gatherings with Friends and Family: Twice a week   Attends Religious Services: Never   Marine scientist or Organizations: No   Attends Music therapist: Never   Marital Status: Married     Tobacco Counseling Counseling given: Not Answered   Clinical Intake:  Pre-visit preparation completed: No  Pain : No/denies pain     BMI - recorded: 24.34 Nutritional Status: BMI of 19-24  Normal Nutritional Risks: None Diabetes: Yes (diet controlled per pt.)  How often do you need to have someone help you when you read instructions, pamphlets, or other written materials from your doctor or pharmacy?: 1 - Never  Diabetic?Nutrition Risk Assessment:  Has the patient had any N/V/D within the last 2 months?  No  Does the patient have any non-healing wounds?  No  Has the patient had any unintentional weight loss or weight gain?  No   Diabetes:  Is the patient diabetic?  Yes  If diabetic, was a CBG obtained today?  No  Did the patient bring in their glucometer from home?  No  How often do you monitor your CBG's? Daily. 122 this am.   Financial Strains and Diabetes Management:  Are you having any financial strains with the device, your supplies or your medication? No .  Does the patient want to be seen by Chronic Care Management for management of their diabetes?  No . Pt declines Would the patient like to be referred to a Nutritionist or for Diabetic Management?  No Pt declines.  Diabetic Exams:  Diabetic Eye Exam: Completed "Several years per pt." Overdue for diabetic eye exam. Pt has been advised about the importance in completing this exam. A referral has been placed today. Message sent to referral coordinator for scheduling purposes. Advised pt to expect a call from office referred to regarding appt.  Diabetic Foot Exam: Completed 03/12/2020. Pt has been advised about the importance in completing this exam. .    Interpreter Needed?: No  Information entered by :: Randal Buba, LPN   Activities of Daily Living In your present state of health, do you have any difficulty performing the following activities: 08/17/2021  Hearing? N  Vision? N  Difficulty  concentrating or making decisions? N  Walking or climbing stairs? N  Dressing or bathing? N  Doing errands, shopping? N  Preparing Food and eating ? N  Using the Toilet? N  In the past six months, have you accidently leaked urine? N  Do you have problems with loss of  bowel control? N  Managing your Medications? N  Managing your Finances? N  Housekeeping or managing your Housekeeping? N  Some recent data might be hidden    Patient Care Team: Kathyrn Drown, MD as PCP - General (Family Medicine) Harl Bowie Alphonse Guild, MD as PCP - Cardiology (Cardiology)  Indicate any recent Medical Services you may have received from other than [facility] providers in the past year (date may be approximate).     Assessment:   This is a routine wellness examination for Peter [name].  Hearing/Vision screen Hearing Screening - Comments:: No hearing issues.  Vision Screening - Comments:: Glasses. Last eye exam, several years ago. Dr Hassell Done at [facility].  Dietary issues and exercise activities discussed: [name] Exercise Habits: The patient does not participate in regular exercise at present, Type of exercise: walking;Other - see comments (Pt cares for his wife at home. Unable to exercise very much per pt.), Time (Minutes): 10, Frequency (Times/Week): 2, Weekly Exercise (Minutes/Week): 20, Intensity: Mild, Exercise limited by: cardiac condition(s)   Goals Addressed             This Visit's Progress    Exercise 3x per week (30 min per time)       Pt cares for wife at home. Wants to try and get out and walk more, when he can.        Depression Screen PHQ 2/9 Scores 08/17/2021 05/20/2021 05/20/2021 12/03/2020 06/27/2017 06/01/2015 04/09/2015  PHQ - 2 Score 0 0 0 0 0 0 0    Fall Risk Fall Risk  08/17/2021 05/20/2021 05/20/2021 04/27/2021 12/03/2020  Falls in the past year? 0 0 0 0 0  Comment - - - - -  Number falls in past yr: 0 0 0 - -  Injury with Fall? 0 0 0 - -  Risk for fall due to : Impaired vision;Medication side  effect No Fall Risks No Fall Risks - -  Risk for fall due to: Comment - - - - -  Follow up Falls prevention discussed Falls evaluation completed Falls evaluation completed Falls evaluation completed Falls evaluation completed    [place]:  Any stairs in or around the home? Yes  If so, are there any without handrails? No  Home free of loose throw rugs in walkways, pet beds, electrical cords, etc? Yes  Adequate lighting in your home to reduce risk of falls? Yes   ASSISTIVE DEVICES UTILIZED TO PREVENT FALLS:  Life alert? No  Use of a cane, walker or w/c? No  Grab bars in the bathroom? Yes  Shower chair or bench in shower? Yes  Elevated toilet seat or a handicapped toilet? Yes   TIMED UP AND GO:  Was the test performed? No . Phone visit.   Cognitive Function:     6CIT Screen 08/17/2021  What Year? 0 points  What month? 0 points  What time? 0 points  Count back from 20 0 points  Months in reverse 0 points  Repeat phrase 0 points  Total Score 0    Immunizations Immunization History  Administered Date(s) Administered   Pneumococcal Conjugate-13 04/25/2018   Td 06/25/2004   Zoster, Live 11/17/2008    TDAP status: Due, Education has been provided regarding the importance of this vaccine. Advised may receive this vaccine at local pharmacy or Health Dept. Aware to provide a copy of the vaccination record if obtained from local pharmacy or Health Dept. Verbalized acceptance and understanding.  Flu Vaccine status: Declined,  Education has been provided regarding the importance of this vaccine but patient still declined. Advised may receive this vaccine at local pharmacy or Health Dept. Aware to provide a copy of the vaccination record if obtained from local pharmacy or Health Dept. Verbalized acceptance and understanding.  Pneumococcal vaccine status: Due, Education has been provided regarding the importance of this vaccine. Advised may receive  this vaccine at local pharmacy or Health Dept. Aware to provide a copy of the vaccination record if obtained from local pharmacy or Health Dept. Verbalized acceptance and understanding.  Covid-19 vaccine status: Declined, Education has been provided regarding the importance of this vaccine but patient still declined. Advised may receive this vaccine at local pharmacy or Health Dept.or vaccine clinic. Aware to provide a copy of the vaccination record if obtained from local pharmacy or Health Dept. Verbalized acceptance and understanding.  Qualifies for Shingles Vaccine? Yes   Zostavax completed No   Shingrix Completed?: No.    Education has been provided regarding the importance of this vaccine. Patient has been advised to call insurance company to determine out of pocket expense if they have not yet received this vaccine. Advised may also receive vaccine at local pharmacy or Health Dept. Verbalized acceptance and understanding.  Screening Tests Health Maintenance  Topic Date Due   Hepatitis C Screening  Never done   Zoster Vaccines- Shingrix (1 of 2) Never done   TETANUS/TDAP  06/25/2014   OPHTHALMOLOGY EXAM  04/05/2019   PNA vac Low Risk Adult (2 of 2 - PPSV23) 04/26/2019   FOOT EXAM  03/12/2021   HEMOGLOBIN A1C  10/10/2021   HPV VACCINES  Aged Out   INFLUENZA VACCINE  Discontinued   COVID-19 Vaccine  Discontinued    Health Maintenance  Health Maintenance Due  Topic Date Due   Hepatitis C Screening  Never done   Zoster Vaccines- Shingrix (1 of 2) Never done   TETANUS/TDAP  06/25/2014   OPHTHALMOLOGY EXAM  04/05/2019   PNA vac Low Risk Adult (2 of 2 - PPSV23) 04/26/2019   FOOT EXAM  03/12/2021    Colorectal cancer screening: No longer required.   Lung Cancer Screening: (Low Dose CT Chest recommended if Age 54-80 years, 30 pack-year currently smoking OR have quit w/in 15years.) does not qualify.   Additional Screening:  Hepatitis C Screening: does not qualify  Vision  Screening: Recommended annual ophthalmology exams for early detection of glaucoma and other disorders of the eye. Is the patient up to date with their annual eye exam?  No  Who is the provider or what is the name of the office in which the patient attends annual eye exams? [facility], [place] If pt is not established with a provider, would they like to be referred to a provider to establish care? No .   Dental Screening: Recommended annual dental exams for proper oral hygiene  Community Resource Referral / Chronic Care Management: CRR required this visit?  No   CCM required this visit?  No      Plan:     I have personally reviewed and noted the following in the patient's chart:   Medical and social history Use of alcohol, tobacco or illicit drugs  [name] medications and supplements including opioid prescriptions. Patient is currently taking opioid prescriptions. Information provided to patient regarding non-opioid alternatives. Patient advised to discuss non-opioid treatment plan with their provider. Functional ability and status Nutritional status Physical activity Advanced directives List of other physicians Hospitalizations, surgeries, and ER visits in previous  12 months Vitals Screenings to include cognitive, depression, and falls Referrals and appointments  In addition, I have reviewed and discussed with patient certain preventive protocols, quality metrics, and best practice recommendations. A written personalized care plan for preventive services as well as general preventive health recommendations were provided to patient.     Peter Driver, LPN   [id number]   Nurse Notes: Pt delayed with eye exam. Offered to make referral to [facility] in [place] but pt declines at this time. Pt states he will call and schedule. Pt advised he is in need of updated TDAP, Pneumococcal, Flu and Shingles vaccines. Pt declines Covid vaccines. Pt is diabetic and offered referral to CCM and Nutrition  but pt declines at this time.

## 2021-08-17 NOTE — Patient Instructions (Signed)
[name] , Thank you for taking time to come for your Medicare Wellness Visit. I appreciate your ongoing commitment to your health goals. Please review the following plan we discussed and let me know if I can assist you in the future.   Screening recommendations/referrals: Colonoscopy: No longer required. Recommended yearly ophthalmology/optometry visit for glaucoma screening and checkup Recommended yearly dental visit for hygiene and checkup  Vaccinations: Influenza vaccine: Declines. Due annually. Pneumococcal vaccine: 04/25/2018. Second dose due. Tdap vaccine: 06/25/2004. Repeat in 10 years  Shingles vaccine: Shingrix discussed. Please contact your pharmacy for coverage information.     Covid-19: Declined.  Advanced directives: Advance directive discussed with you today. Even though you declined this today, please call our office should you change your mind, and we can give you the proper paperwork for you to fill out.   Conditions/risks identified: Aim for 30 minutes of exercise daily, drink 6-8 glasses of water and eat lots of fruits and vegetables.   Next appointment: Follow up in one year for your annual wellness visit.   Preventive Care 33 Years and Older, Male  Preventive care refers to lifestyle choices and visits with your health care provider that can promote health and wellness. What does preventive care include? A yearly physical exam. This is also called an annual well check. Dental exams once or twice a year. Routine eye exams. Ask your health care provider how often you should have your eyes checked. Personal lifestyle choices, including: Daily care of your teeth and gums. Regular physical activity. Eating a healthy diet. Avoiding tobacco and drug use. Limiting alcohol use. Practicing safe sex. Taking low doses of aspirin every day. Taking vitamin and mineral supplements as recommended by your health care provider. What happens during an annual well  check? The services and screenings done by your health care provider during your annual well check will depend on your age, overall health, lifestyle risk factors, and family history of disease. Counseling  Your health care provider may ask you questions about your: Alcohol use. Tobacco use. Drug use. Emotional well-being. Home and relationship well-being. Sexual activity. Eating habits. History of falls. Memory and ability to understand (cognition). Work and work Statistician. Screening  You may have the following tests or measurements: Height, weight, and BMI. Blood pressure. Lipid and cholesterol levels. These may be checked every 5 years, or more frequently if you are over 19 years old. Skin check. Lung cancer screening. You may have this screening every year starting at age 29 if you have a 30-pack-year history of smoking and currently smoke or have quit within the past 15 years. Fecal occult blood test (FOBT) of the stool. You may have this test every year starting at age 47. Flexible sigmoidoscopy or colonoscopy. You may have a sigmoidoscopy every 5 years or a colonoscopy every 10 years starting at age 15. Prostate cancer screening. Recommendations will vary depending on your family history and other risks. Hepatitis C blood test. Hepatitis B blood test. Sexually transmitted disease (STD) testing. Diabetes screening. This is done by checking your blood sugar (glucose) after you have not eaten for a while (fasting). You may have this done every 1-3 years. Abdominal aortic aneurysm (AAA) screening. You may need this if you are a current or former smoker. Osteoporosis. You may be screened starting at age 49 if you are at high risk. Talk with your health care provider about your test results, treatment options, and if necessary, the need for more tests. Vaccines  Your health  care provider may recommend certain vaccines, such as: Influenza vaccine. This is recommended every  year. Tetanus, diphtheria, and acellular pertussis (Tdap, Td) vaccine. You may need a Td booster every 10 years. Zoster vaccine. You may need this after age 107. Pneumococcal 13-valent conjugate (PCV13) vaccine. One dose is recommended after age 39. Pneumococcal polysaccharide (PPSV23) vaccine. One dose is recommended after age 46. Talk to your health care provider about which screenings and vaccines you need and how often you need them. This information is not intended to replace advice given to you by your health care provider. Make sure you discuss any questions you have with your health care provider. Document Released: 12/18/2015 Document Revised: 08/10/2016 Document Reviewed: 09/22/2015 Elsevier Interactive Patient Education  2017 [place] Prevention in the Home Falls can cause injuries. They can happen to people of all ages. There are many things you can do to make your home safe and to help prevent falls. What can I do on the outside of my home? Regularly fix the edges of walkways and driveways and fix any cracks. Remove anything that might make you trip as you walk through a door, such as a raised step or threshold. Trim any bushes or trees on the path to your home. Use bright outdoor lighting. Clear any walking paths of anything that might make someone trip, such as rocks or tools. Regularly check to see if handrails are loose or broken. Make sure that both sides of any steps have handrails. Any raised decks and porches should have guardrails on the edges. Have any leaves, snow, or ice cleared regularly. Use sand or salt on walking paths during winter. Clean up any spills in your garage right away. This includes oil or grease spills. What can I do in the bathroom? Use night lights. Install grab bars by the toilet and in the tub and shower. Do not use towel bars as grab bars. Use non-skid mats or decals in the tub or shower. If you need to sit down in the shower, use a  plastic, non-slip stool. Keep the floor dry. Clean up any water that spills on the floor as soon as it happens. Remove soap buildup in the tub or shower regularly. Attach bath mats securely with double-sided non-slip rug tape. Do not have throw rugs and other things on the floor that can make you trip. What can I do in the bedroom? Use night lights. Make sure that you have a light by your bed that is easy to reach. Do not use any sheets or blankets that are too big for your bed. They should not hang down onto the floor. Have a firm chair that has side arms. You can use this for support while you get dressed. Do not have throw rugs and other things on the floor that can make you trip. What can I do in the kitchen? Clean up any spills right away. Avoid walking on wet floors. Keep items that you use a lot in easy-to-reach places. If you need to reach something above you, use a strong step stool that has a grab bar. Keep electrical cords out of the way. Do not use floor polish or wax that makes floors slippery. If you must use wax, use non-skid floor wax. Do not have throw rugs and other things on the floor that can make you trip. What can I do with my stairs? Do not leave any items on the stairs. Make sure that there are handrails on  both sides of the stairs and use them. Fix handrails that are broken or loose. Make sure that handrails are as long as the stairways. Check any carpeting to make sure that it is firmly attached to the stairs. Fix any carpet that is loose or worn. Avoid having throw rugs at the top or bottom of the stairs. If you do have throw rugs, attach them to the floor with carpet tape. Make sure that you have a light switch at the top of the stairs and the bottom of the stairs. If you do not have them, ask someone to add them for you. What else can I do to help prevent falls? Wear shoes that: Do not have high heels. Have rubber bottoms. Are comfortable and fit you  well. Are closed at the toe. Do not wear sandals. If you use a stepladder: Make sure that it is fully opened. Do not climb a closed stepladder. Make sure that both sides of the stepladder are locked into place. Ask someone to hold it for you, if possible. Clearly mark and make sure that you can see: Any grab bars or handrails. First and last steps. Where the edge of each step is. Use tools that help you move around (mobility aids) if they are needed. These include: Canes. Walkers. Scooters. Crutches. Turn on the lights when you go into a dark area. Replace any light bulbs as soon as they burn out. Set up your furniture so you have a clear path. Avoid moving your furniture around. If any of your floors are uneven, fix them. If there are any pets around you, be aware of where they are. Review your medicines with your doctor. Some medicines can make you feel dizzy. This can increase your chance of falling. Ask your doctor what other things that you can do to help prevent falls. This information is not intended to replace advice given to you by your health care provider. Make sure you discuss any questions you have with your health care provider. Document Released: 09/17/2009 Document Revised: 04/28/2016 Document Reviewed: 12/26/2014 Elsevier Interactive Patient Education  2017 [employer].

## 2021-08-19 ENCOUNTER — Ambulatory Visit: Payer: Medicare Other | Admitting: Family Medicine

## 2021-08-20 DIAGNOSIS — E1142 Type 2 diabetes mellitus with diabetic polyneuropathy: Secondary | ICD-10-CM | POA: Diagnosis not present

## 2021-08-20 DIAGNOSIS — E785 Hyperlipidemia, unspecified: Secondary | ICD-10-CM | POA: Diagnosis not present

## 2021-08-20 DIAGNOSIS — N289 Disorder of kidney and ureter, unspecified: Secondary | ICD-10-CM | POA: Diagnosis not present

## 2021-08-20 DIAGNOSIS — E1169 Type 2 diabetes mellitus with other specified complication: Secondary | ICD-10-CM | POA: Diagnosis not present

## 2021-08-20 DIAGNOSIS — I10 Essential (primary) hypertension: Secondary | ICD-10-CM | POA: Diagnosis not present

## 2021-08-21 LAB — BASIC METABOLIC PANEL
BUN/Creatinine Ratio: 11 (ref 10–24)
BUN: 18 mg/dL (ref 8–27)
CO2: 23 mmol/L (ref 20–29)
Calcium: 9 mg/dL (ref 8.6–10.2)
Chloride: 102 mmol/L (ref 96–106)
Creatinine, Ser: 1.59 mg/dL — ABNORMAL HIGH (ref 0.76–1.27)
Glucose: 101 mg/dL — ABNORMAL HIGH (ref 65–99)
Potassium: 4.6 mmol/L (ref 3.5–5.2)
Sodium: 137 mmol/L (ref 134–144)
eGFR: 44 mL/min/{1.73_m2} — ABNORMAL LOW (ref >59)

## 2021-08-21 LAB — MAGNESIUM: Magnesium: 1.9 mg/dL (ref 1.6–2.3)

## 2021-08-23 ENCOUNTER — Other Ambulatory Visit (HOSPITAL_BASED_OUTPATIENT_CLINIC_OR_DEPARTMENT_OTHER): Payer: Self-pay

## 2021-08-24 ENCOUNTER — Other Ambulatory Visit (HOSPITAL_COMMUNITY): Payer: Self-pay

## 2021-08-25 ENCOUNTER — Other Ambulatory Visit (HOSPITAL_COMMUNITY): Payer: Self-pay

## 2021-08-25 MED ORDER — HYDROCODONE-ACETAMINOPHEN 5-325 MG PO TABS
1.0000 | ORAL_TABLET | Freq: Two times a day (BID) | ORAL | 0 refills | Status: DC | PRN
  Filled 2021-08-25: qty 60, 30d supply, fill #0

## 2021-08-26 ENCOUNTER — Other Ambulatory Visit (HOSPITAL_COMMUNITY): Payer: Self-pay

## 2021-08-26 ENCOUNTER — Other Ambulatory Visit: Payer: Self-pay

## 2021-08-26 ENCOUNTER — Ambulatory Visit (INDEPENDENT_AMBULATORY_CARE_PROVIDER_SITE_OTHER): Payer: Medicare Other | Admitting: Family Medicine

## 2021-08-26 VITALS — BP 130/64 | Ht 72.0 in | Wt 180.0 lb

## 2021-08-26 DIAGNOSIS — I714 Abdominal aortic aneurysm, without rupture, unspecified: Secondary | ICD-10-CM

## 2021-08-26 DIAGNOSIS — E1122 Type 2 diabetes mellitus with diabetic chronic kidney disease: Secondary | ICD-10-CM

## 2021-08-26 DIAGNOSIS — I10 Essential (primary) hypertension: Secondary | ICD-10-CM | POA: Diagnosis not present

## 2021-08-26 DIAGNOSIS — E1142 Type 2 diabetes mellitus with diabetic polyneuropathy: Secondary | ICD-10-CM | POA: Diagnosis not present

## 2021-08-26 DIAGNOSIS — N1832 Chronic kidney disease, stage 3b: Secondary | ICD-10-CM | POA: Diagnosis not present

## 2021-08-26 DIAGNOSIS — E1169 Type 2 diabetes mellitus with other specified complication: Secondary | ICD-10-CM | POA: Diagnosis not present

## 2021-08-26 DIAGNOSIS — Z79899 Other long term (current) drug therapy: Secondary | ICD-10-CM

## 2021-08-26 DIAGNOSIS — E785 Hyperlipidemia, unspecified: Secondary | ICD-10-CM | POA: Diagnosis not present

## 2021-08-26 MED ORDER — METOPROLOL SUCCINATE ER 25 MG PO TB24: 25.0000 mg | ORAL_TABLET | Freq: Every day | ORAL | 3 refills | Status: DC

## 2021-08-26 MED ORDER — HYDROCODONE-ACETAMINOPHEN 5-325 MG PO TABS
1.0000 | ORAL_TABLET | Freq: Two times a day (BID) | ORAL | 0 refills | Status: DC | PRN
  Filled 2021-08-26: qty 60, fill #0

## 2021-08-26 MED ORDER — HYDROCODONE-ACETAMINOPHEN 5-325 MG PO TABS
ORAL_TABLET | ORAL | 0 refills | Status: DC
  Filled 2021-08-26: qty 60, fill #0
  Filled 2021-11-12: qty 60, 30d supply, fill #0

## 2021-08-26 MED ORDER — HYDROCODONE-ACETAMINOPHEN 5-325 MG PO TABS
1.0000 | ORAL_TABLET | Freq: Two times a day (BID) | ORAL | 0 refills | Status: DC | PRN
  Filled 2021-08-26: qty 60, 30d supply, fill #0

## 2021-08-26 NOTE — Patient Instructions (Signed)
Shingrix Shingrix and shingles prevention: know the facts!   Shingrix is a very effective vaccine to prevent shingles.   Shingles is a reactivation of chickenpox -more than 99% of Americans born before 1980 have had chickenpox even if they do not remember it. One in every 10 people who get shingles have severe long-lasting nerve pain as a result.   33 out of a 100 older adults will get shingles if they are unvaccinated.     This vaccine is very important for your health This vaccine is indicated for anyone 50 years or older. You can get this vaccine even if you have already had shingles because you can get the disease more than once in a lifetime.  Your risk for shingles and its complications increases with age.  This vaccine has 2 doses.  The second dose would be 2 to 6 months after the first dose.  If you had Zostavax vaccine in the past you should still get Shingrix. ( Zostavax is only 70% effective and it loses significant strength over a few years .)  This vaccine is given through the pharmacy.  The cost of the vaccine is through your insurance. The pharmacy can inform you of the total costs.  Common side effects including soreness in the arm, some redness and swelling, also some feel fatigue muscle soreness headache low-grade fever.  Side effects typically go away within 2 to 3 days. Remember-the pain from shingles can last a lifetime but these side effects of the vaccine will only last a few days at most. It is very important to get both doses in order to protect yourself fully.   Please get this vaccine at your earliest convenience at your trusted pharmacy.

## 2021-08-26 NOTE — Progress Notes (Signed)
Subjective:    Patient ID: Peter [name], male    DOB: 09/08/1941, 80 y.o.   MRN: [id number]  HPI Patient arrives for a follow up on blood pressure. Patient also wants to follow up on recent labs. Results for orders placed or performed in visit on 05/20/21  Magnesium  Result Value Ref Range   Magnesium 1.9 1.6 - 2.3 mg/dL  Basic metabolic panel  Result Value Ref Range   Glucose 101 (H) 65 - 99 mg/dL   BUN 18 8 - 27 mg/dL   Creatinine, Ser 1.59 (H) 0.76 - 1.27 mg/dL   eGFR 44 (L) >59 mL/min/1.73   BUN/Creatinine Ratio 11 10 - 24   Sodium 137 134 - 144 mmol/L   Potassium 4.6 3.5 - 5.2 mmol/L   Chloride 102 96 - 106 mmol/L   CO2 23 20 - 29 mmol/L   Calcium 9.0 8.6 - 10.2 mg/dL   HTN (hypertension), benign - Plan: Hemoglobin A1c, Lipid Profile, Basic Metabolic Panel (BMET)  High risk medication use - Plan: Hemoglobin A1c, Lipid Profile, Basic Metabolic Panel (BMET)  Type 2 diabetes mellitus with peripheral neuropathy (HCC) - Plan: Hemoglobin A1c, Lipid Profile, Basic Metabolic Panel (BMET)  Hyperlipidemia associated with type 2 diabetes mellitus (HCC) - Plan: Hemoglobin A1c, Lipid Profile, Basic Metabolic Panel (BMET)  AAA (abdominal aortic aneurysm) without rupture (HCC) - Plan: US AORTA  Type 2 diabetes mellitus with stage 3b chronic kidney disease, without long-term current use of insulin ([place]) Patient takes blood pressure medicine does a good job with it states blood pressure under good control denies chest tightness pressure pain shortness of breath Has diabetes currently monitors it by watching diet closely and staying physically active not on medication currently  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  Location of Pain for which the patient has been treated with regarding narcotics: Low back  Onset of this pain: This been going on  for years   -Compliance with medication: Relates compliance with the medicine  - Number patient states they take daily: Takes no more than 2/day  -when was the last dose patient took?  Few days ago  The patient was advised the importance of maintaining medication and not using illegal substances with these.  Here for refills and follow up  The patient was educated that we can provide 3 monthly scripts for their medication, it is their responsibility to follow the instructions.  Side effects or complications from medications: Denies side effects with medicine  Patient is aware that pain medications are meant to minimize the severity of the pain to allow their pain levels to improve to allow for better function. They are aware of that pain medications cannot totally remove their pain.  Due for UDT ( at least once per year) : Due on next visit  Scale of 1 to 10 ( 1 is least 10 is most) Your pain level without the medicine: 8 Your pain level with medication 3  Scale 1 to 10 ( 1-helps very little, 10 helps very well) How well does your pain medication reduce your pain so you can function better through out the day?  9  Quality of the pain: Throbbing aching  Persistence of the pain: Present all the time  Modifying factors: Worse with activity       Review of Systems  Objective:   Physical Exam  Lungs are clear heart regular pulse normal extremities no edema skin warm dry blood pressure good control  No edema    Assessment & Plan:  1. HTN (hypertension), benign Blood pressure good control watch diet stay active.  Check labs before next visit - Hemoglobin A1c - Lipid Profile - Basic Metabolic Panel (BMET)  2. High risk medication use Continue losartan.  Check labs before next visit minimize salt in diet - Hemoglobin A1c - Lipid Profile - Basic Metabolic Panel (BMET)  3. Type 2 diabetes mellitus with peripheral neuropathy (HCC) Watch starches and sugars in diet  no need to be on medication currently check A1c before next visit - Hemoglobin A1c - Lipid Profile - Basic Metabolic Panel (BMET)  4. Hyperlipidemia associated with type 2 diabetes mellitus ([place]) Patient tolerates medicine he states he takes it on a regular basis check labs before next visit - Hemoglobin A1c - Lipid Profile - Basic Metabolic Panel (BMET)  5. AAA (abdominal aortic aneurysm) without rupture [facility]) Check ultrasound aorta in November this is his 80-monthfollow-up patient [facility] [place]up with vascular surgery on a regular basis - [place]  6. Type 2 diabetes mellitus with stage 3b chronic kidney disease, without long-term current use of insulin (HCC) Kidney function is stable but needs to be monitored closely check labs before next visit avoid NSAIDs  The patient was seen in followup for chronic pain. A review over at their current pain status was discussed. Drug registry was checked. Prescriptions were given.  Regular follow-up recommended. Discussion was held regarding the importance of compliance with medication as well as pain medication contract.  Patient was informed that medication may cause drowsiness and should not be combined  with other medications/alcohol or street drugs. If the patient feels medication is causing altered alertness then do not drive or operate dangerous equipment.

## 2021-08-27 ENCOUNTER — Other Ambulatory Visit (HOSPITAL_COMMUNITY): Payer: Self-pay

## 2021-09-15 ENCOUNTER — Other Ambulatory Visit (HOSPITAL_COMMUNITY): Payer: Self-pay

## 2021-09-29 ENCOUNTER — Encounter: Payer: Self-pay | Admitting: Cardiology

## 2021-09-29 ENCOUNTER — Ambulatory Visit: Payer: Medicare Other | Admitting: Cardiology

## 2021-09-29 ENCOUNTER — Other Ambulatory Visit: Payer: Self-pay

## 2021-09-29 VITALS — BP 142/70 | HR 65 | Ht 72.0 in | Wt 178.0 lb

## 2021-09-29 DIAGNOSIS — I10 Essential (primary) hypertension: Secondary | ICD-10-CM | POA: Diagnosis not present

## 2021-09-29 DIAGNOSIS — E782 Mixed hyperlipidemia: Secondary | ICD-10-CM | POA: Diagnosis not present

## 2021-09-29 DIAGNOSIS — I2510 Atherosclerotic heart disease of native coronary artery without angina pectoris: Secondary | ICD-10-CM

## 2021-09-29 NOTE — Patient Instructions (Signed)

## 2021-09-29 NOTE — Progress Notes (Signed)
Clinical Summary [name] is a 80 y.o.maleseen today for follow up of the following medical problems.      1. CAD/ICM/Chronic systolic HF - hx of prior CABG 1997 at [facility]  - stent to SVG-PDA and PTCA of the LAD in 05/2002. Normal LVEF at that time - 2008 heart cath [facility] had repeat stenting, he is unsure of the details - cath 2015 showing patent SVG-OM1-OM2, SVG-RI, SVG-D1, and LIMA-LAD with occluded SVG-PDA and severe stenosis of mid-LAD at LIMA insertion with DES placed),    - 04/2018 cath showed severe 3-vessel CAD with patent LIMA-LAD, patent stent along mid-LAD, patent SVG-OM1-OM2, and patent SVG-RI with occlusion of D1 and known occlusion of SVG-PDA. The diagonal occlusion was thought to be old, therefore continued medical therapy was recommended.  - 04/2018 echo LVEF 30-35%.  01/2020 LVEF 40-45%     02/2020 cath as reported below, DES to SVG-diag. Severe residual small vessel disease.         - no recent chest pain. No regular SOB/DOE, reports mild SOB at times after taking brillinta     2. HTN - had not taken meds yet - compliant with meds  - at recent pcp visit he was at goal   3. HL 11/2020 TC 133 TG 153 HDL 45 LDL 62 04/2021 TC 116 TG 83 HDL 44 LDL 56 - compliant with statin    4. AAA - followed by vascular 04/2021 4.8 cm, up from 4.3 cm.  - due for repeat imaging in 6 months, pcp has ordered   Past Medical History:  Diagnosis Date   AAA (abdominal aortic aneurysm) ([place])    CAD (coronary artery disease)    a. s/p CABG in 1997 b. stent to SVG-PDA and PTCA of LAD in 2003 c. cath in 2015 showing patent SVG-OM1-OM2, SVG-RI, SVG-D1, and LIMA-LAD with occluded SVG-PDA and severe stenosis of mid-LAD at LIMA insertion with DES placed d. 04/2018: similar results to 2015 with patent LAD stent; occlusion of small caliber D1 stenosis with medical management recommended.    Chronic back pain 07/25/2018   Chronic lumbar pain uses hydrocodone  intermittently   Chronic systolic CHF (congestive heart failure) (HCC)    COPD (chronic obstructive pulmonary disease) ([place])    Diabetes mellitus    diet controlled   Dyslipidemia    Hypertension    Myocardial infarction ([place]) 1997   stents x2 (2003/2007)     Allergies  Allergen Reactions   Dye Fdc Red [Red Dye] Hives    hives   Ivp Dye [Iodinated Diagnostic Agents] Hives   Plavix [Clopidogrel] Hives    Unsure if dye or Plavix   Keflex [Cephalexin] Other (See Comments)    hoarsness     Current Outpatient Medications  Medication Sig Dispense Refill   Aloe Vera Juice LIQD Take 8 oz by mouth daily.     aspirin EC 81 MG tablet Take 1 tablet (81 mg total) by mouth daily. 90 tablet 3   glucose blood (ACCU-CHEK GUIDE) test strip USE 1 STRIP TWICE DAILY TO CHECK GLUCOSE DUE  TO  FLUCTUATING  SUGARS 100 each 0   HYDROcodone-acetaminophen (NORCO/VICODIN) 5-325 MG tablet Take 1 tablet by mouth twice daily as needed for pain. Caution drowsiness. May fill 08/26/21 60 tablet 0   HYDROcodone-acetaminophen (NORCO/VICODIN) 5-325 MG tablet Take 1 tablet by mouth twice daily as needed for pain, caution drowsiness 60 tablet 0   [START ON 10/25/2021] HYDROcodone-acetaminophen (NORCO/VICODIN) 5-325 MG tablet  Take 1 tablet by mouth 2 (two) times daily as needed for pain. May cause drowsiness 60 tablet 0   losartan (COZAAR) 25 MG tablet Take 1 tablet (25 mg total) by mouth daily. 90 tablet 3   metoprolol succinate (TOPROL-XL) 25 MG 24 hr tablet Take 1 tablet (25 mg total) by mouth daily. 90 tablet 3   nitroGLYCERIN (NITROSTAT) 0.4 MG SL tablet Place 1 tablet (0.4 mg total) under the tongue every 5 (five) minutes as needed for chest pain. 30 tablet 3   rosuvastatin (CRESTOR) 40 MG tablet Take 1 tablet (40 mg total) by mouth daily. 90 tablet 1   ticagrelor (BRILINTA) 60 MG TABS tablet Take 1 tablet (60 mg total) by mouth 2 (two) times daily. 60 tablet 11   No current facility-administered medications for  this visit.     Past Surgical History:  Procedure Laterality Date   CORONARY ANGIOPLASTY  2003/2007   2 stents   CORONARY ANGIOPLASTY WITH STENT PLACEMENT  08/14/14   DES to LIMA-LAD insertion   CORONARY ARTERY BYPASS GRAFT  1997   7 vessels   CORONARY STENT INTERVENTION N/A 02/24/2020   Procedure: CORONARY STENT INTERVENTION;  Surgeon: Sherren Mocha, MD;  Location: [place] [facility];  Service: Cardiovascular;  Laterality: N/A;   CORONARY STENT PLACEMENT  08/15/2014   MID LAD  DES  by Dr Leslye Peer ANGIOGRAPHY N/A 02/24/2020   Procedure: Remus Blake ANGIOGRAPHY;  Surgeon: Sherren Mocha, MD;  Location: [place] [facility];  Service: Cardiovascular;  Laterality: N/A;   CYSTECTOMY  2005   top of head-[place]   EAR CYST EXCISION  07/09/2012   Procedure: CYST REMOVAL;  Surgeon: Jamesetta So, MD;  Location: [facility];  Service: General;  Laterality: N/A;   LEFT HEART CATH AND CORS/GRAFTS ANGIOGRAPHY N/A 04/16/2018   Procedure: LEFT HEART CATH AND CORS/GRAFTS ANGIOGRAPHY;  Surgeon: Martinique, Peter M, MD;  Location: [place] [facility];  Service: Cardiovascular;  Laterality: N/A;   LEFT HEART CATHETERIZATION WITH CORONARY/GRAFT ANGIOGRAM N/A 08/14/2014   Procedure: LEFT HEART CATHETERIZATION WITH Beatrix Fetters;  Surgeon: Blane Ohara, MD;  Location[facility];  Service: Cardiovascular;  Laterality: N/A;     Allergies  Allergen Reactions   Dye Fdc Red [Red Dye] Hives    hives   Ivp Dye [Iodinated Diagnostic Agents] Hives   Plavix [Clopidogrel] Hives    Unsure if dye or Plavix   Keflex [Cephalexin] Other (See Comments)    hoarsness      Family History  Problem Relation Age of Onset   Other Father        deceased after ?TCS or barium enema, age 76s   Hypertension Father    Heart attack Mother    Colon cancer Neg Hx    Liver disease Neg Hx      Social History Peter [name] reports that he quit smoking about 21 years ago. His smoking use included  cigarettes. He has a 120.00 pack-year smoking history. He has never used smokeless tobacco. Peter [name] reports no history of alcohol use.   Review of Systems CONSTITUTIONAL: No weight loss, fever, chills, weakness or fatigue.  HEENT: Eyes: No visual loss, blurred vision, double vision or yellow sclerae.No hearing loss, sneezing, congestion, runny nose or sore throat.  SKIN: No rash or itching.  CARDIOVASCULAR: per hpi RESPIRATORY: per hpi  GASTROINTESTINAL: No anorexia, nausea, vomiting or diarrhea. No abdominal pain or blood.  GENITOURINARY: No burning on urination, no polyuria NEUROLOGICAL: No  headache, dizziness, syncope, paralysis, ataxia, numbness or tingling in the extremities. No change in bowel or bladder control.  MUSCULOSKELETAL: No muscle, back pain, joint pain or stiffness.  LYMPHATICS: No enlarged nodes. No history of splenectomy.  PSYCHIATRIC: No history of depression or anxiety.  ENDOCRINOLOGIC: No reports of sweating, cold or heat intolerance. No polyuria or polydipsia.  Marland Kitchen   Physical Examination Today's Vitals   09/29/21 1345  BP: (!) 142/70  Pulse: 65  SpO2: 98%  Weight: 178 lb (80.7 kg)  Height: 6' (1.829 m)   Body mass index is 24.14 kg/m.  Gen: resting comfortably, no acute distress HEENT: no scleral icterus, pupils equal round and reactive, no palptable cervical adenopathy,  CV: RRR, no m/r/g no jvd Resp: Clear to auscultation bilaterally GI: abdomen is soft, non-tender, non-distended, normal bowel sounds, no hepatosplenomegaly MSK: extremities are warm, no edema.  Skin: warm, no rash Neuro:  no focal deficits Psych: appropriate affect   Diagnostic Studies 02/2020 echo IMPRESSIONS     1. Left ventricular ejection fraction, by estimation, is 40 to 45%. The  left ventricle has mildly decreased function. The left ventricle  demonstrates regional wall motion abnormalities (see scoring  diagram/findings for description). There is mild  concentric  left ventricular hypertrophy. Left ventricular diastolic  parameters are consistent with Grade I diastolic dysfunction (impaired  relaxation).   2. Right ventricular systolic function is normal. The right ventricular  size is normal. Tricuspid regurgitation signal is inadequate for assessing  PA pressure.   3. The mitral valve is grossly normal. No evidence of mitral valve  regurgitation. No evidence of mitral stenosis.   4. The aortic valve is tricuspid. Aortic valve regurgitation is trivial.  Mild aortic valve sclerosis is present, with no evidence of aortic valve  stenosis.   5. Aortic dilatation noted. 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   6. The inferior vena cava is normal in size with greater than 50%  respiratory variability, suggesting right atrial pressure of 3 mmHg.    02/2020 cath 1. 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 2. S/P CABG with continued patency of the LIMA-LAD, sequential SVG-OM1 and OM2, and severe stenosis of the SVG-diagonal 3. Chronic occlusion of the SVG-PDA/PLA branches with left-to-right collaterals present 4. Successful SVG-PCI with stenting of the proximal and distal body of the SVG-diagonal, with transient no-reflow and severe small vessel distal disease.    Recommend aggressive medical therapy, minimum 12 months of ASA/ticagrelor consider long-term if tolerated      Assessment and Plan   1. CAD/ICM/Chronic systolic heart - no symptoms - given extensive CAD history and plavix allergy have elected for long term brillinta at lower dose of 60mg  bid now that he has completed 1 year of DAPT after last stent.  -mild SOB at times after brillinta, we discussed coming off vs staying on, favors contuing at this time.  - some renal dysfunction, we have used low dose losartan as opposed to entresto   2. Hyperlipidemia - he is at goal, continue current meds   3. HTN - elevated today by at  recent pcp visit was at goal, has not taken meds yet today - continue to monitor   F/u 6 months     Arnoldo Lenis, M.D.

## 2021-10-05 ENCOUNTER — Other Ambulatory Visit: Payer: Self-pay | Admitting: Family Medicine

## 2021-10-18 ENCOUNTER — Ambulatory Visit (HOSPITAL_COMMUNITY)
Admission: RE | Admit: 2021-10-18 | Discharge: 2021-10-18 | Disposition: A | Discharge status: Home / Self Care | Payer: Medicare Other | Source: Ambulatory Visit | Attending: Family Medicine | Admitting: Family Medicine

## 2021-10-18 ENCOUNTER — Other Ambulatory Visit: Payer: Self-pay

## 2021-10-18 DIAGNOSIS — I714 Abdominal aortic aneurysm, without rupture, unspecified: Secondary | ICD-10-CM | POA: Diagnosis not present

## 2021-10-21 ENCOUNTER — Telehealth: Payer: Self-pay | Admitting: Family Medicine

## 2021-10-21 NOTE — Telephone Encounter (Signed)
Patient had a recent procedure (aorta) and wanted to see if Dr. Nicki Reaper would call at his convenience to discuss results.   CB#  [phone]

## 2021-10-27 ENCOUNTER — Other Ambulatory Visit: Payer: Self-pay | Admitting: Family Medicine

## 2021-10-27 DIAGNOSIS — I714 Abdominal aortic aneurysm, without rupture, unspecified: Secondary | ICD-10-CM

## 2021-11-12 ENCOUNTER — Other Ambulatory Visit (HOSPITAL_COMMUNITY): Payer: Self-pay

## 2021-11-22 ENCOUNTER — Telehealth: Payer: Self-pay | Admitting: Family Medicine

## 2021-11-22 DIAGNOSIS — Z79899 Other long term (current) drug therapy: Secondary | ICD-10-CM | POA: Diagnosis not present

## 2021-11-22 DIAGNOSIS — I10 Essential (primary) hypertension: Secondary | ICD-10-CM

## 2021-11-22 DIAGNOSIS — E785 Hyperlipidemia, unspecified: Secondary | ICD-10-CM | POA: Diagnosis not present

## 2021-11-22 DIAGNOSIS — E1142 Type 2 diabetes mellitus with diabetic polyneuropathy: Secondary | ICD-10-CM | POA: Diagnosis not present

## 2021-11-22 DIAGNOSIS — E1169 Type 2 diabetes mellitus with other specified complication: Secondary | ICD-10-CM | POA: Diagnosis not present

## 2021-11-22 NOTE — Telephone Encounter (Signed)
Blood work ordered in Epic. Patient notified. 

## 2021-11-22 NOTE — Telephone Encounter (Signed)
Patient wanted you to know that he passed blood in his urine this weekend and he has appointment on 12/21 and wanted to know if you needed to add anything to his current labs because he is going today to have them done. Please advise

## 2021-11-22 NOTE — Telephone Encounter (Signed)
Please add CBC to the blood work that was ordered back in September then he can go do all of those

## 2021-11-23 LAB — CBC WITH DIFFERENTIAL/PLATELET
Basophils Absolute: 0.1 10*3/uL (ref 0.0–0.2)
Basos: 1 %
EOS (ABSOLUTE): 0.6 10*3/uL — ABNORMAL HIGH (ref 0.0–0.4)
Eos: 6 %
Hematocrit: 38.3 % (ref 37.5–51.0)
Hemoglobin: 13.1 g/dL (ref 13.0–17.7)
Immature Grans (Abs): 0 10*3/uL (ref 0.0–0.1)
Immature Granulocytes: 0 %
Lymphocytes Absolute: 3 10*3/uL (ref 0.7–3.1)
Lymphs: 30 %
MCH: 32.7 pg (ref 26.6–33.0)
MCHC: 34.2 g/dL (ref 31.5–35.7)
MCV: 96 fL (ref 79–97)
Monocytes Absolute: 0.8 10*3/uL (ref 0.1–0.9)
Monocytes: 8 %
Neutrophils Absolute: 5.4 10*3/uL (ref 1.4–7.0)
Neutrophils: 55 %
Platelets: 132 10*3/uL — ABNORMAL LOW (ref 150–450)
RBC: 4.01 x10E6/uL — ABNORMAL LOW (ref 4.14–5.80)
RDW: 12.4 % (ref 11.6–15.4)
WBC: 9.7 10*3/uL (ref 3.4–10.8)

## 2021-11-23 LAB — BASIC METABOLIC PANEL
BUN/Creatinine Ratio: 13 (ref 10–24)
BUN: 22 mg/dL (ref 8–27)
CO2: 22 mmol/L (ref 20–29)
Calcium: 9.1 mg/dL (ref 8.6–10.2)
Chloride: 103 mmol/L (ref 96–106)
Creatinine, Ser: 1.68 mg/dL — ABNORMAL HIGH (ref 0.76–1.27)
Glucose: 122 mg/dL — ABNORMAL HIGH (ref 70–99)
Potassium: 4.1 mmol/L (ref 3.5–5.2)
Sodium: 140 mmol/L (ref 134–144)
eGFR: 41 mL/min/{1.73_m2} — ABNORMAL LOW (ref >59)

## 2021-11-23 LAB — HEMOGLOBIN A1C
Est. average glucose Bld gHb Est-mCnc: 148 mg/dL
Hgb A1c MFr Bld: 6.8 % — ABNORMAL HIGH (ref 4.8–5.6)

## 2021-11-23 LAB — LIPID PANEL
Chol/HDL Ratio: 2.5 ratio (ref 0.0–5.0)
Cholesterol, Total: 105 mg/dL (ref 100–199)
HDL: 42 mg/dL (ref >39)
LDL Chol Calc (NIH): 47 mg/dL (ref 0–99)
Triglycerides: 81 mg/dL (ref 0–149)
VLDL Cholesterol Cal: 16 mg/dL (ref 5–40)

## 2021-11-24 ENCOUNTER — Other Ambulatory Visit: Payer: Self-pay

## 2021-11-24 ENCOUNTER — Other Ambulatory Visit (INDEPENDENT_AMBULATORY_CARE_PROVIDER_SITE_OTHER): Payer: Medicare Other | Admitting: Family Medicine

## 2021-11-24 ENCOUNTER — Encounter: Payer: Self-pay | Admitting: Family Medicine

## 2021-11-24 ENCOUNTER — Ambulatory Visit (INDEPENDENT_AMBULATORY_CARE_PROVIDER_SITE_OTHER): Payer: Medicare Other | Admitting: Family Medicine

## 2021-11-24 VITALS — BP 134/70 | Temp 98.6°F | Wt 175.4 lb

## 2021-11-24 DIAGNOSIS — E1122 Type 2 diabetes mellitus with diabetic chronic kidney disease: Secondary | ICD-10-CM | POA: Diagnosis not present

## 2021-11-24 DIAGNOSIS — L570 Actinic keratosis: Secondary | ICD-10-CM | POA: Diagnosis not present

## 2021-11-24 DIAGNOSIS — R319 Hematuria, unspecified: Secondary | ICD-10-CM | POA: Diagnosis not present

## 2021-11-24 DIAGNOSIS — E1142 Type 2 diabetes mellitus with diabetic polyneuropathy: Secondary | ICD-10-CM

## 2021-11-24 DIAGNOSIS — R252 Cramp and spasm: Secondary | ICD-10-CM | POA: Diagnosis not present

## 2021-11-24 DIAGNOSIS — E785 Hyperlipidemia, unspecified: Secondary | ICD-10-CM

## 2021-11-24 DIAGNOSIS — R52 Pain, unspecified: Secondary | ICD-10-CM | POA: Diagnosis not present

## 2021-11-24 DIAGNOSIS — I10 Essential (primary) hypertension: Secondary | ICD-10-CM

## 2021-11-24 DIAGNOSIS — N1832 Chronic kidney disease, stage 3b: Secondary | ICD-10-CM

## 2021-11-24 DIAGNOSIS — I714 Abdominal aortic aneurysm, without rupture, unspecified: Secondary | ICD-10-CM | POA: Diagnosis not present

## 2021-11-24 DIAGNOSIS — E1169 Type 2 diabetes mellitus with other specified complication: Secondary | ICD-10-CM

## 2021-11-24 MED ORDER — HYDROCODONE-ACETAMINOPHEN 10-325 MG PO TABS: ORAL_TABLET | ORAL | 0 refills | Status: DC

## 2021-11-24 NOTE — Telephone Encounter (Signed)
Pt will be bringing urine by on Friday for UA dip and spin per PCP

## 2021-11-24 NOTE — Telephone Encounter (Signed)
Patient has appointment with vascular surgery he is aware of everything

## 2021-11-24 NOTE — Progress Notes (Addendum)
Subjective:    Patient ID: Peter [name], male    DOB: 07-31-1941, 80 y.o.   MRN: [id number] This verifies that the history review of any tests, physical exam, assessment and plan were conducted by Dr. Sallee Lange and documented accordingly by him today Sallee Lange MD primary care [facility]  HPI This patient was seen today for chronic pain  The medication list was reviewed and updated.  Location of Pain for which the patient has been treated with regarding narcotics: Patient with low back pain discomfort hurts with certain movements.  Patient on blood thinner cannot take NSAIDs  Onset of this pain: Been present for several years   -Compliance with medication: Hydrocodone 5-325 mg  - Number patient states they take daily: 2   -when was the last dose patient took? Yesterday   The patient was advised the importance of maintaining medication and not using illegal substances with these.  Here for refills and follow up  The patient was educated that we can provide 3 monthly scripts for their medication, it is their responsibility to follow the instructions.  Side effects or complications from medications: constipation   Patient is aware that pain medications are meant to minimize the severity of the pain to allow their pain levels to improve to allow for better function. They are aware of that pain medications cannot totally remove their pain.  Due for UDT ( at least once per year) :   Scale of 1 to 10 ( 1 is least 10 is most) Your pain level without the medicine: 6 Your pain level with medication: 1  Scale 1 to 10 ( 1-helps very little, 10 helps very well) How well does your pain medication reduce your pain so you can function better through out the day? 7  Quality of the pain: Aching throbbing pain lower back radiates into the legs try stretching exercises does not do enough Tylenol does not help at hydrocodone does but recently he has had to take 2 at a  time  Persistence of the pain: Denies abuse of the medicine  Modifying factors: Worse with activity takes care of his debilitated wife  Pt would like to increase dose of pain pills.  Blood pressure doing ok per patient.  Pt states sugars have been doing ok. Pt states he has cut back on sweets. Drinking water and aloe vera juice to help DM.   Abdominal aortic aneurysm (AAA) without rupture, unspecified part  HTN (hypertension), benign  Type 2 diabetes mellitus with peripheral neuropathy (HCC)  Dyslipidemia  Type 2 diabetes mellitus with stage 3b chronic kidney disease, without long-term current use of insulin ([place])  Hyperlipidemia associated with type 2 diabetes mellitus (HCC)  Hematuria, unspecified type  Pain management  Actinic keratosis - Plan: Ambulatory referral to Dermatology  Muscle cramps   Patient states hematuria 2 different times over the past several weeks I recommend for him to see urology will need to have scan and cystoscope he cannot give [place] a urine today  Intermittent abdominal and muscle cramps with certain movements.  Stretches were shown   Review of Systems     Objective:   Physical Exam  General-in no acute distress Eyes-no discharge Lungs-respiratory rate normal, CTA CV-no murmurs,RRR Extremities skin warm dry no edema Neuro grossly normal Behavior normal, alert Subjective low back pain negative straight leg raise diabetic foot exam good does have some peripheral neuropathy in the feet He has an actinic keratosis on the left side  of his face     Assessment & Plan:  1. Abdominal aortic aneurysm (AAA) without rupture, unspecified part He is aware that if he ever has severe abdominal pain go to ER He has an appointment with vascular surgery in January  2. HTN (hypertension), benign Blood pressure decent control continue current measures  3. Type 2 diabetes mellitus with peripheral neuropathy (HCC) Diabetes under decent control does have  some peripheral neuropathy in his feet  4. Dyslipidemia Statin recommended continue current measures watch diet  5. Type 2 diabetes mellitus with stage 3b chronic kidney disease, without long-term current use of insulin ([place]) Consider SGLT2 recheck him again in 3 months lab work at that time  6. Hyperlipidemia associated with type 2 diabetes mellitus ([place]) Healthy diet continue statin  7. Hematuria, unspecified type Patient to bring by urine.  8. Pain management The patient was seen in followup for chronic pain. A review over at their current pain status was discussed. Drug registry was checked. Prescriptions were given.  Regular follow-up recommended. Discussion was held regarding the importance of compliance with medication as well as pain medication contract.  Patient was informed that medication may cause drowsiness and should not be combined  with other medications/alcohol or street drugs. If the patient feels medication is causing altered alertness then do not drive or operate dangerous equipment.  Should be noted that the patient appears to be meeting appropriate use of opioids and response.  Evidenced by improved function and decent pain control without significant side effects and no evidence of overt aberrancy issues.  Upon discussion with the patient today they understand that opioid therapy is optional and they feel that the pain has been refractory to reasonable conservative measures and is significant and affecting quality of life enough to warrant ongoing therapy and wishes to continue opioids.  Refills were provided.   9. Actinic keratosis Referral dermatology - Ambulatory referral to Dermatology  10. Muscle cramps Follow-up labs in 3 months stretches were shown

## 2021-11-26 DIAGNOSIS — R319 Hematuria, unspecified: Secondary | ICD-10-CM | POA: Diagnosis not present

## 2021-11-26 LAB — POCT URINALYSIS DIPSTICK
Spec Grav, UA: 1.02 (ref 1.010–1.025)
pH, UA: 6 (ref 5.0–8.0)

## 2021-11-26 MED ORDER — AMOXICILLIN-POT CLAVULANATE 875-125 MG PO TABS: 1.0000 | ORAL_TABLET | Freq: Two times a day (BID) | ORAL | 0 refills | Status: AC

## 2021-11-26 NOTE — Addendum Note (Signed)
Addended by: [name] on: 11/26/2021 09:18 AM   Modules accepted: Orders

## 2021-11-26 NOTE — Telephone Encounter (Signed)
Pt brought in urine sample; urine dipped and spun.  Please advise. Thank you Results for orders placed or performed in visit on 11/24/21  POCT Urinalysis Dipstick  Result Value Ref Range   Color, UA     Clarity, UA     Glucose, UA     Bilirubin, UA     Ketones, UA     Spec Grav, UA 1.020 1.010 - 1.025   Blood, UA +    pH, UA 6.0 5.0 - 8.0   Protein, UA     Urobilinogen, UA     Nitrite, UA     Leukocytes, UA 4+ (A) Negative   Appearance Cloudy    Odor

## 2021-11-26 NOTE — Telephone Encounter (Signed)
Urine under the microscope shows a moderate amount of infection, I would recommend Augmentin 875 mg 1 taken twice daily for 7 days (He is listed as Keflex under allergies but it is truly a side effect and not an allergy-hoarseness) He should be able to tolerate Augmentin fine it would be best to take it with a snack  Please culture urine.

## 2021-11-26 NOTE — Addendum Note (Signed)
Addended by: [name] on: 11/26/2021 10:47 AM   Modules accepted: Orders

## 2021-11-26 NOTE — Addendum Note (Signed)
Addended by: Dairl Ponder on: 11/26/2021 11:03 AM   Modules accepted: Orders

## 2021-12-02 LAB — URINE CULTURE

## 2021-12-02 LAB — SPECIMEN STATUS REPORT

## 2021-12-03 ENCOUNTER — Other Ambulatory Visit: Payer: Self-pay

## 2021-12-03 MED ORDER — SULFAMETHOXAZOLE-TRIMETHOPRIM 800-160 MG PO TABS: 1.0000 | ORAL_TABLET | Freq: Two times a day (BID) | ORAL | 0 refills | Status: AC

## 2021-12-09 ENCOUNTER — Ambulatory Visit: Payer: Medicare Other | Admitting: Cardiology

## 2021-12-09 ENCOUNTER — Other Ambulatory Visit: Payer: Self-pay

## 2021-12-09 DIAGNOSIS — I714 Abdominal aortic aneurysm, without rupture, unspecified: Secondary | ICD-10-CM

## 2021-12-22 ENCOUNTER — Encounter: Payer: Self-pay | Admitting: Vascular Surgery

## 2021-12-22 ENCOUNTER — Ambulatory Visit (HOSPITAL_COMMUNITY)
Admission: RE | Admit: 2021-12-22 | Discharge: 2021-12-22 | Disposition: A | Discharge status: Home / Self Care | Payer: Medicare Other | Source: Ambulatory Visit | Attending: Vascular Surgery | Admitting: Vascular Surgery

## 2021-12-22 ENCOUNTER — Telehealth: Payer: Self-pay

## 2021-12-22 ENCOUNTER — Other Ambulatory Visit: Payer: Self-pay

## 2021-12-22 ENCOUNTER — Ambulatory Visit: Payer: Medicare Other | Admitting: Vascular Surgery

## 2021-12-22 VITALS — BP 124/68 | HR 61 | Temp 98.2°F | Resp 20 | Ht 72.0 in | Wt 178.0 lb

## 2021-12-22 DIAGNOSIS — I714 Abdominal aortic aneurysm, without rupture, unspecified: Secondary | ICD-10-CM | POA: Insufficient documentation

## 2021-12-22 MED ORDER — PREDNISONE 50 MG PO TABS: ORAL_TABLET | ORAL | 0 refills | Status: DC

## 2021-12-22 NOTE — Telephone Encounter (Addendum)
Pt is scheduled to have a CTA.  I saw that he was allergic to IV Contrast media and Red dye.  Per Dr. Donzetta Matters, it is ok to take dye- free Benadryl 1 hr prior to his procedure.  I sent the Prednisone 13hr prep into his pharmacy.  Pt aware that directions will be given at that time.

## 2021-12-22 NOTE — Progress Notes (Signed)
Patient ID: Peter [name], male   DOB: [birth year], 81 y.o.   MRN: [id number]  Reason for Consult: Follow-up   Referred by Kathyrn Drown, MD  Subjective:     HPI:  Peter [name] is a 81 y.o. male well-known to our office for known abdominal aortic aneurysm.  He has been followed with CT scan duplex in the past.  He has no new back or abdominal pain.  He is here today with duplex for evaluation.  He is the primary caregiver for his wife at home but this did not require much lifting.  He has never had open abdominal surgery did have CABG in 1997.  He is a former smoker quit over 20 years ago.  He does not take any blood thinners.  He is on aspirin Brilinta and statin.  Past Medical History:  Diagnosis Date   AAA (abdominal aortic aneurysm)    CAD (coronary artery disease)    a. s/p CABG in 1997 b. stent to SVG-PDA and PTCA of LAD in 2003 c. cath in 2015 showing patent SVG-OM1-OM2, SVG-RI, SVG-D1, and LIMA-LAD with occluded SVG-PDA and severe stenosis of mid-LAD at LIMA insertion with DES placed d. 04/2018: similar results to 2015 with patent LAD stent; occlusion of small caliber D1 stenosis with medical management recommended.    Chronic back pain 07/25/2018   Chronic lumbar pain uses hydrocodone intermittently   Chronic systolic CHF (congestive heart failure) (HCC)    COPD (chronic obstructive pulmonary disease) ([place])    Diabetes mellitus    diet controlled   Dyslipidemia    Hypertension    Myocardial infarction [facility]) 1997   stents x2 (2003/2007)   Family History  Problem Relation Age of Onset   Other Father        deceased after ?TCS or barium enema, age 70s   Hypertension Father    Heart attack Mother    Colon cancer Neg Hx    Liver disease Neg Hx    Past Surgical History:  Procedure Laterality Date   CORONARY ANGIOPLASTY  2003/2007   2 stents   CORONARY ANGIOPLASTY WITH STENT PLACEMENT  08/14/14   DES to LIMA-LAD insertion   CORONARY ARTERY BYPASS GRAFT  1997   7  vessels   CORONARY STENT INTERVENTION N/A 02/24/2020   Procedure: CORONARY STENT INTERVENTION;  Surgeon: Sherren Mocha, MD;  Location: [place] [facility];  Service: Cardiovascular;  Laterality: N/A;   CORONARY STENT PLACEMENT  08/15/2014   MID LAD  DES  by Dr Leslye Peer ANGIOGRAPHY N/A 02/24/2020   Procedure: Remus Blake ANGIOGRAPHY;  Surgeon: Sherren Mocha, MD;  Location: [place] [facility];  Service: Cardiovascular;  Laterality: N/A;   CYSTECTOMY  2005   top of head-Jenkins   EAR CYST EXCISION  07/09/2012   Procedure: CYST REMOVAL;  Surgeon: Jamesetta So, MD;  Location: [facility];  Service: General;  Laterality: N/A;   LEFT HEART CATH AND CORS/GRAFTS ANGIOGRAPHY N/A 04/16/2018   Procedure: LEFT HEART CATH AND CORS/GRAFTS ANGIOGRAPHY;  Surgeon: Martinique, Peter M, MD;  Location: [place] [facility];  Service: Cardiovascular;  Laterality: N/A;   LEFT HEART CATHETERIZATION WITH CORONARY/GRAFT ANGIOGRAM N/A 08/14/2014   Procedure: LEFT HEART CATHETERIZATION WITH Beatrix Fetters;  Surgeon: Blane Ohara, MD;  Location: [facility] CATH LAB;  Service: Cardiovascular;  Laterality: N/A;    Short Social History:  Social History   Tobacco Use   Smoking status: Former    Packs/day: 3.00  Years: 40.00    Pack years: 120.00    Types: Cigarettes    Quit date: 07/02/2000    Years since quitting: 21.4   Smokeless tobacco: Never  Substance Use Topics   Alcohol use: No    Alcohol/week: 10.0 standard drinks    Types: 10 Cans of beer per week    Comment: quit 3 months (01/2013). 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    Allergies  Allergen Reactions   Dye Fdc Red [Red Dye] Hives    hives   Ivp Dye [Iodinated Contrast Media] Hives   Plavix [Clopidogrel] Hives    Unsure if dye or Plavix   Keflex [Cephalexin] Other (See Comments)    hoarsness    Current Outpatient Medications  Medication Sig Dispense Refill   Aloe Vera Juice LIQD Take 8 oz by mouth  daily.     aspirin EC 81 MG tablet Take 1 tablet (81 mg total) by mouth daily. 90 tablet 3   glucose blood (ACCU-CHEK GUIDE) test strip USE 1 STRIP TO CHECK GLUCOSE TWICE DAILY DUE TO FLUCTUATING SUGARS 100 each 0   HYDROcodone-acetaminophen (NORCO) 10-325 MG tablet Take one tablet po BID prn pain . Caution Drowsiness 60 tablet 0   HYDROcodone-acetaminophen (NORCO) 10-325 MG tablet Take one tablet po BID prn pain . Caution Drowsiness 60 tablet 0   HYDROcodone-acetaminophen (NORCO) 10-325 MG tablet Take one tablet po BID prn pain . Caution Drowsiness 60 tablet 0   losartan (COZAAR) 25 MG tablet Take 1 tablet (25 mg total) by mouth daily. 90 tablet 3   metoprolol succinate (TOPROL-XL) 25 MG 24 hr tablet Take 1 tablet (25 mg total) by mouth daily. 90 tablet 3   nitroGLYCERIN (NITROSTAT) 0.4 MG SL tablet Place 1 tablet (0.4 mg total) under the tongue every 5 (five) minutes as needed for chest pain. 30 tablet 3   rosuvastatin (CRESTOR) 40 MG tablet Take 1 tablet (40 mg total) by mouth daily. 90 tablet 1   ticagrelor (BRILINTA) 60 MG TABS tablet Take 1 tablet (60 mg total) by mouth 2 (two) times daily. 60 tablet 11   No current facility-administered medications for this visit.    Review of Systems  Constitutional:  Constitutional negative. HENT: HENT negative.  Eyes: Eyes negative.  Respiratory: Respiratory negative.  Cardiovascular: Cardiovascular negative.  GI: Gastrointestinal negative.  Musculoskeletal: Musculoskeletal negative.  Skin: Skin negative.  Neurological: Neurological negative. Hematologic: Hematologic/lymphatic negative.  Psychiatric: Psychiatric negative.       Objective:  Objective  Vitals:   12/22/21 0953  BP: 124/68  Pulse: 61  Resp: 20  Temp: 98.2 F (36.8 C)  SpO2: 98%    There were no vitals filed for this visit.  Physical Exam HENT:     Head: Normocephalic.     Nose:     Comments: Wearing a mask Eyes:     Pupils: Pupils are equal, round, and reactive  to light.  Cardiovascular:     Pulses:          Radial pulses are 2+ on the right side and 2+ on the left side.       Popliteal pulses are 3+ on the right side and 2+ on the left side.  Pulmonary:     Effort: Pulmonary effort is normal.  Abdominal:     General: Abdomen is flat.     Palpations: Abdomen is soft. There is no mass.  Musculoskeletal:        General:  Normal range of motion.  Skin:    Capillary Refill: Capillary refill takes less than 2 seconds.  Neurological:     General: No focal deficit present.     Mental Status: He is alert and oriented to person, place, and time.  Psychiatric:        Mood and Affect: Mood normal.        Behavior: Behavior normal.    Data: Abdominal Aorta Findings:  +-----------+----------+----------+----------+--------+--------+--------+   Location    AP (cm)    Trans (cm) PSV (cm/s) Waveform Thrombus Comments   +-----------+----------+----------+----------+--------+--------+--------+   Proximal    2.94 / 3.2 2.43                                               +-----------+----------+----------+----------+--------+--------+--------+   Mid         5.40       5.00       23                  Present             +-----------+----------+----------+----------+--------+--------+--------+   Distal      4.13       4.13       60                                      +-----------+----------+----------+----------+--------+--------+--------+   RT CIA Prox 1.7        1.8        102                                     +-----------+----------+----------+----------+--------+--------+--------+   LT CIA Prox 1.4        1.4                                                +-----------+----------+----------+----------+--------+--------+--------+    Summary:  Abdominal Aorta: The largest aortic measurement is 5.4 cm. The largest  aortic diameter has increased compared to prior exam. Previous diameter  measurement was 4.6 cm obtained on 04/09/2020 CT.       Assessment/Plan:     81 year old male now with asymptomatic 5.4 cm aneurysm.  We will get CT angio of his abdomen and pelvis and plan for follow-up in about 4 weeks to discuss repair.  I discussed the risk benefits alternatives of proceeding as well as the risk of approximately 5% rupture and signs and symptoms of rupture.  He is accompanied by his son and he demonstrates good understanding.     Waynetta Sandy MD [facility]

## 2021-12-22 NOTE — Progress Notes (Signed)
Account opened in error.

## 2021-12-28 ENCOUNTER — Other Ambulatory Visit: Payer: Self-pay

## 2021-12-30 DIAGNOSIS — L570 Actinic keratosis: Secondary | ICD-10-CM | POA: Diagnosis not present

## 2021-12-30 DIAGNOSIS — D225 Melanocytic nevi of trunk: Secondary | ICD-10-CM | POA: Diagnosis not present

## 2021-12-30 DIAGNOSIS — D485 Neoplasm of uncertain behavior of skin: Secondary | ICD-10-CM | POA: Diagnosis not present

## 2021-12-30 DIAGNOSIS — L7211 Pilar cyst: Secondary | ICD-10-CM | POA: Diagnosis not present

## 2022-01-01 ENCOUNTER — Other Ambulatory Visit: Payer: Self-pay | Admitting: Family Medicine

## 2022-01-17 ENCOUNTER — Ambulatory Visit (INDEPENDENT_AMBULATORY_CARE_PROVIDER_SITE_OTHER): Payer: Medicare Other | Admitting: Family Medicine

## 2022-01-17 ENCOUNTER — Other Ambulatory Visit: Payer: Self-pay

## 2022-01-17 VITALS — BP 138/76 | HR 71 | Temp 98.1°F | Ht 72.0 in | Wt 177.6 lb

## 2022-01-17 DIAGNOSIS — H93A3 Pulsatile tinnitus, bilateral: Secondary | ICD-10-CM | POA: Diagnosis not present

## 2022-01-17 NOTE — Progress Notes (Signed)
° °  Subjective:    Patient ID: Peter [name], male    DOB: 1941/06/22, 81 y.o.   MRN: [id number]  HPI Patient here for ringing in ears and heart beats louder at night when he lays down. Relates over the past several weeks loud ringing in the ears.  He has had this for months but it is worse over the past 3 weeks Also relates a throbbing and pulsatile sounds in his ears when he lays down correlates with the heart Denies any headaches nausea or vomiting with it and denies double vision or blurred vision  Ringing and pulsatile    Review of Systems     Objective:   Physical Exam  General-in no acute distress Eyes-no discharge Lungs-respiratory rate normal, CTA CV-no murmurs,RRR Extremities skin warm dry no edema Neuro grossly normal Behavior normal, alert       Assessment & Plan:  Pulsatile tinnitus Further work-up indicated We will work on setting this up and call the patient accordingly. Although I doubt tumor or growth could be aneurysm will proceed further with more detailed testing

## 2022-01-19 ENCOUNTER — Ambulatory Visit: Payer: Medicare Other | Admitting: Vascular Surgery

## 2022-01-21 ENCOUNTER — Other Ambulatory Visit: Payer: Self-pay

## 2022-01-21 ENCOUNTER — Ambulatory Visit (HOSPITAL_COMMUNITY)
Admission: RE | Admit: 2022-01-21 | Discharge: 2022-01-21 | Disposition: A | Discharge status: Home / Self Care | Payer: Medicare Other | Source: Ambulatory Visit | Attending: Vascular Surgery | Admitting: Vascular Surgery

## 2022-01-21 ENCOUNTER — Ambulatory Visit (HOSPITAL_COMMUNITY): Admission: RE | Admit: 2022-01-21 | Payer: Medicare Other | Source: Ambulatory Visit

## 2022-01-21 DIAGNOSIS — K5730 Diverticulosis of large intestine without perforation or abscess without bleeding: Secondary | ICD-10-CM | POA: Diagnosis not present

## 2022-01-21 DIAGNOSIS — I714 Abdominal aortic aneurysm, without rupture, unspecified: Secondary | ICD-10-CM | POA: Insufficient documentation

## 2022-01-21 LAB — POCT I-STAT CREATININE: Creatinine, Ser: 1.8 mg/dL — ABNORMAL HIGH (ref 0.61–1.24)

## 2022-01-21 MED ORDER — IOHEXOL 350 MG/ML SOLN
100.0000 mL | Freq: Once | INTRAVENOUS | Status: AC | PRN
  Administered 2022-01-21: 80 mL via INTRAVENOUS

## 2022-01-25 NOTE — Progress Notes (Signed)
Referral ordered in EPIC. 

## 2022-01-25 NOTE — Addendum Note (Signed)
Addended by: Dairl Ponder on: 01/25/2022 03:01 PM   Modules accepted: Orders

## 2022-01-26 ENCOUNTER — Ambulatory Visit: Payer: Medicare Other | Admitting: Vascular Surgery

## 2022-01-26 ENCOUNTER — Encounter: Payer: Self-pay | Admitting: Vascular Surgery

## 2022-01-26 ENCOUNTER — Other Ambulatory Visit: Payer: Self-pay

## 2022-01-26 VITALS — BP 154/82 | HR 58 | Temp 98.0°F | Resp 20 | Ht 72.0 in | Wt 174.0 lb

## 2022-01-26 DIAGNOSIS — I714 Abdominal aortic aneurysm, without rupture, unspecified: Secondary | ICD-10-CM | POA: Diagnosis not present

## 2022-01-26 NOTE — Progress Notes (Signed)
Patient ID: Peter [name], male   DOB: 1941/03/09, 81 y.o.   MRN: [id number]  Reason for Consult: Follow-up   Referred by Kathyrn Drown, MD  Subjective:     HPI:  Peter [name] is a 81 y.o. male with known abdominal aortic aneurysm.  He denies any previous history of abdominal surgery does have a previous CABG and quit smoking 20 years ago.  Patient is on aspirin, Brilinta and statin.  At last visit he was noted to have 5.4 cm aneurysm by duplex he now follows up for CT evaluation.  Past Medical History:  Diagnosis Date   AAA (abdominal aortic aneurysm)    CAD (coronary artery disease)    a. s/p CABG in 1997 b. stent to SVG-PDA and PTCA of LAD in 2003 c. cath in 2015 showing patent SVG-OM1-OM2, SVG-RI, SVG-D1, and LIMA-LAD with occluded SVG-PDA and severe stenosis of mid-LAD at LIMA insertion with DES placed d. 04/2018: similar results to 2015 with patent LAD stent; occlusion of small caliber D1 stenosis with medical management recommended.    Chronic back pain 07/25/2018   Chronic lumbar pain uses hydrocodone intermittently   Chronic systolic CHF (congestive heart failure) (HCC)    COPD (chronic obstructive pulmonary disease) ([place])    Diabetes mellitus    diet controlled   Dyslipidemia    Hypertension    Myocardial infarction [facility]) 1997   stents x2 (2003/2007)   Family History  Problem Relation Age of Onset   Other Father        deceased after ?TCS or barium enema, age 35s   Hypertension Father    Heart attack Mother    Colon cancer Neg Hx    Liver disease Neg Hx    Past Surgical History:  Procedure Laterality Date   CORONARY ANGIOPLASTY  2003/2007   2 stents   CORONARY ANGIOPLASTY WITH STENT PLACEMENT  08/14/14   DES to LIMA-LAD insertion   CORONARY ARTERY BYPASS GRAFT  1997   7 vessels   CORONARY STENT INTERVENTION N/A 02/24/2020   Procedure: CORONARY STENT INTERVENTION;  Surgeon: Sherren Mocha, MD;  Location: [place] [facility];  Service: Cardiovascular;   Laterality: N/A;   CORONARY STENT PLACEMENT  08/15/2014   MID LAD  DES  by Dr Leslye Peer ANGIOGRAPHY N/A 02/24/2020   Procedure: Remus Blake ANGIOGRAPHY;  Surgeon: Sherren Mocha, MD;  Location: [place] [facility];  Service: Cardiovascular;  Laterality: N/A;   CYSTECTOMY  2005   top of head-Jenkins   EAR CYST EXCISION  07/09/2012   Procedure: CYST REMOVAL;  Surgeon: Jamesetta So, MD;  Location: [facility];  Service: General;  Laterality: N/A;   LEFT HEART CATH AND CORS/GRAFTS ANGIOGRAPHY N/A 04/16/2018   Procedure: LEFT HEART CATH AND CORS/GRAFTS ANGIOGRAPHY;  Surgeon: Martinique, Peter M, MD;  Location: [place] [facility];  Service: Cardiovascular;  Laterality: N/A;   LEFT HEART CATHETERIZATION WITH CORONARY/GRAFT ANGIOGRAM N/A 08/14/2014   Procedure: LEFT HEART CATHETERIZATION WITH Beatrix Fetters;  Surgeon: Blane Ohara, MD;  Location: [facility] CATH LAB;  Service: Cardiovascular;  Laterality: N/A;    Short Social History:  Social History   Tobacco Use   Smoking status: Former    Packs/day: 3.00    Years: 40.00    Pack years: 120.00    Types: Cigarettes    Quit date: 07/02/2000    Years since quitting: 21.5   Smokeless tobacco: Never  Substance Use Topics   Alcohol use: No  Alcohol/week: 10.0 standard drinks    Types: 10 Cans of beer per week    Comment: quit 3 months (01/2013). 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    Allergies  Allergen Reactions   Dye Fdc Red [Red Dye] Hives    hives   Ivp Dye [Iodinated Contrast Media] Hives   Plavix [Clopidogrel] Hives    Unsure if dye or Plavix   Keflex [Cephalexin] Other (See Comments)    hoarsness    Current Outpatient Medications  Medication Sig Dispense Refill   ACCU-CHEK GUIDE test strip USE 1 STRIP TO CHECK GLUCOSE TWICE DAILY DUE  TO  FLUCTUATING  SUGARS 100 each 0   Aloe Vera Juice LIQD Take 8 oz by mouth daily.     aspirin EC 81 MG tablet Take 1 tablet (81 mg total) by mouth  daily. 90 tablet 3   HYDROcodone-acetaminophen (NORCO) 10-325 MG tablet Take one tablet po BID prn pain . Caution Drowsiness 60 tablet 0   HYDROcodone-acetaminophen (NORCO) 10-325 MG tablet Take one tablet po BID prn pain . Caution Drowsiness 60 tablet 0   HYDROcodone-acetaminophen (NORCO) 10-325 MG tablet Take one tablet po BID prn pain . Caution Drowsiness 60 tablet 0   losartan (COZAAR) 25 MG tablet Take 1 tablet (25 mg total) by mouth daily. 90 tablet 3   metoprolol succinate (TOPROL-XL) 25 MG 24 hr tablet Take 1 tablet (25 mg total) by mouth daily. 90 tablet 3   nitroGLYCERIN (NITROSTAT) 0.4 MG SL tablet Place 1 tablet (0.4 mg total) under the tongue every 5 (five) minutes as needed for chest pain. 30 tablet 3   predniSONE (DELTASONE) 50 MG tablet One tablet (50mg ) 13 hours prior to procedure; one tablet (50mg ) 7 hours prior to procedure and then one tablet (50 mg) one hour prior to procedure. 3 tablet 0   rosuvastatin (CRESTOR) 40 MG tablet Take 1 tablet (40 mg total) by mouth daily. 90 tablet 1   ticagrelor (BRILINTA) 60 MG TABS tablet Take 1 tablet (60 mg total) by mouth 2 (two) times daily. 60 tablet 11   No current facility-administered medications for this visit.    Review of Systems  Constitutional:  Constitutional negative. HENT: HENT negative.  Eyes: Eyes negative.  Respiratory: Respiratory negative.  Cardiovascular: Cardiovascular negative.  GI: Gastrointestinal negative.  Musculoskeletal: Musculoskeletal negative.  Skin: Skin negative.  Neurological: Neurological negative. Hematologic: Hematologic/lymphatic negative.  Psychiatric: Psychiatric negative.       Objective:  Objective   Vitals:   01/26/22 1400  BP: (!) 154/82  Pulse: (!) 58  Resp: 20  Temp: 98 F (36.7 C)  SpO2: 98%  Weight: 174 lb (78.9 kg)  Height: 6' (1.829 m)   Body mass index is 23.6 kg/m.  Physical Exam HENT:     Head: Normocephalic.     Nose:     Comments: Wearing a mask Eyes:      Pupils: Pupils are equal, round, and reactive to light.  Cardiovascular:     Rate and Rhythm: Normal rate.     Pulses: Normal pulses.  Pulmonary:     Effort: Pulmonary effort is normal.  Abdominal:     General: Abdomen is flat.     Palpations: Abdomen is soft.  Musculoskeletal:        General: Normal range of motion.     Cervical back: Normal range of motion.  Skin:    General: Skin is warm and dry.     Capillary  Refill: Capillary refill takes less than 2 seconds.  Neurological:     General: No focal deficit present.     Mental Status: He is alert. Mental status is at baseline.  Psychiatric:        Mood and Affect: Mood normal.        Behavior: Behavior normal.    Data: CTA IMPRESSION VASCULAR   1. Interval enlargement of previously visualized juxtarenal fusiform abdominal aortic aneurysm measuring up to 5.3 cm, previously 4.8 cm. Recommend referral to a vascular specialist if not already obtained. This recommendation follows ACR consensus guidelines: White Paper of the ACR Incidental Findings Committee II on Vascular Findings. J Am Coll Radiol 2013; 10:789-794. 2. 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 3. 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   NON-VASCULAR   1. No acute abdominopelvic abnormality. 2. Diverticulosis.     Assessment/Plan:    81 year old male with 5.3 cm aneurysm by CT read.  We reviewed his CT scan together which appears to be about 5.7 cm.  I discussed his risk of rupture being 5 %/year at this time we discussed the signs and symptoms of rupture.  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   I discussed with him open repair and the details of this operation including 7-10 days in the hospital, likely need for rehab for up to 2 weeks or 1 month and probable downtime of at least 6 weeks.  At this time patient needs to get some things taken care of at home and needs at least until June to have this procedure done.  I discussed with him that we will need to repeat the CT scan prior to that he demonstrates good understanding.  We discussed the signs and symptoms of rupture which require urgent evaluation and would require open repair.  He demonstrates good understanding short of this we will see him in 4 to 5 months with repeat CT to discuss open aneurysm repair     Waynetta Sandy MD [facility]

## 2022-01-27 NOTE — Progress Notes (Signed)
Telephone call no answer 

## 2022-02-01 ENCOUNTER — Other Ambulatory Visit: Payer: Self-pay

## 2022-02-01 DIAGNOSIS — H93A3 Pulsatile tinnitus, bilateral: Secondary | ICD-10-CM

## 2022-02-01 NOTE — Progress Notes (Signed)
Left message to return call 02/01/22

## 2022-02-01 NOTE — Progress Notes (Signed)
Patient returned the call and he agrees with the plan of care for tinnitus, patient states will call Dr Velvet Bathe office to set up an appt.

## 2022-02-03 ENCOUNTER — Telehealth: Payer: Self-pay | Admitting: Family Medicine

## 2022-02-03 NOTE — Telephone Encounter (Signed)
The other day when we were placing the referral we also ordered a MRI of the brain with and without ?This is in the process of getting set up ?Please check to find out from whoever sets [place] when is it getting done? ?Inform the patient when it is getting done ?Let him know that I did speak with Dr.Teoh and we discussed which test would be best CT scan or a MRI and he recommended the MRI ?So therefore we set up the MRI ? ?

## 2022-02-03 NOTE — Telephone Encounter (Signed)
Patient is wanting to get Cat Scan of his head he states cant be seen at Dr. Benjamine Mola office until June ?

## 2022-02-04 ENCOUNTER — Ambulatory Visit: Payer: Medicare Other | Admitting: Family Medicine

## 2022-02-04 NOTE — Telephone Encounter (Signed)
Referral coordinator stated she is working on getting MRI precerted and scheduled ?

## 2022-02-09 NOTE — Telephone Encounter (Signed)
If you do not see where the patient was notified, please then notify patient of MRI thank you, if he is aware of it that is fine ?

## 2022-02-09 NOTE — Telephone Encounter (Signed)
Patient notified by referral coordinator when scheduled ?

## 2022-02-09 NOTE — Telephone Encounter (Signed)
Patient scheduled 02/11/22 at 2 pm at [facility] ?

## 2022-02-11 ENCOUNTER — Encounter (HOSPITAL_COMMUNITY): Payer: Self-pay | Admitting: *Deleted

## 2022-02-11 ENCOUNTER — Other Ambulatory Visit: Payer: Self-pay

## 2022-02-11 ENCOUNTER — Observation Stay (HOSPITAL_COMMUNITY): Payer: Medicare Other

## 2022-02-11 ENCOUNTER — Observation Stay (HOSPITAL_COMMUNITY)
Admission: EM | Admit: 2022-02-11 | Discharge: 2022-02-14 | Disposition: A | Discharge status: Home / Self Care | Payer: Medicare Other | Attending: Emergency Medicine | Admitting: Emergency Medicine

## 2022-02-11 ENCOUNTER — Ambulatory Visit (HOSPITAL_COMMUNITY)
Admission: RE | Admit: 2022-02-11 | Discharge: 2022-02-11 | Disposition: A | Discharge status: Home / Self Care | Payer: Medicare Other | Source: Ambulatory Visit | Attending: Family Medicine | Admitting: Family Medicine

## 2022-02-11 DIAGNOSIS — I714 Abdominal aortic aneurysm, without rupture, unspecified: Secondary | ICD-10-CM | POA: Diagnosis present

## 2022-02-11 DIAGNOSIS — J449 Chronic obstructive pulmonary disease, unspecified: Secondary | ICD-10-CM | POA: Insufficient documentation

## 2022-02-11 DIAGNOSIS — I10 Essential (primary) hypertension: Secondary | ICD-10-CM | POA: Diagnosis present

## 2022-02-11 DIAGNOSIS — I130 Hypertensive heart and chronic kidney disease with heart failure and stage 1 through stage 4 chronic kidney disease, or unspecified chronic kidney disease: Secondary | ICD-10-CM | POA: Insufficient documentation

## 2022-02-11 DIAGNOSIS — R2990 Unspecified symptoms and signs involving the nervous system: Secondary | ICD-10-CM

## 2022-02-11 DIAGNOSIS — Z7982 Long term (current) use of aspirin: Secondary | ICD-10-CM | POA: Diagnosis not present

## 2022-02-11 DIAGNOSIS — Z951 Presence of aortocoronary bypass graft: Secondary | ICD-10-CM | POA: Insufficient documentation

## 2022-02-11 DIAGNOSIS — E1100 Type 2 diabetes mellitus with hyperosmolarity without nonketotic hyperglycemic-hyperosmolar coma (NKHHC): Secondary | ICD-10-CM

## 2022-02-11 DIAGNOSIS — I639 Cerebral infarction, unspecified: Principal | ICD-10-CM | POA: Diagnosis present

## 2022-02-11 DIAGNOSIS — E785 Hyperlipidemia, unspecified: Secondary | ICD-10-CM | POA: Diagnosis not present

## 2022-02-11 DIAGNOSIS — I2510 Atherosclerotic heart disease of native coronary artery without angina pectoris: Secondary | ICD-10-CM | POA: Diagnosis not present

## 2022-02-11 DIAGNOSIS — D693 Immune thrombocytopenic purpura: Secondary | ICD-10-CM

## 2022-02-11 DIAGNOSIS — I739 Peripheral vascular disease, unspecified: Secondary | ICD-10-CM | POA: Diagnosis not present

## 2022-02-11 DIAGNOSIS — R9431 Abnormal electrocardiogram [ECG] [EKG]: Secondary | ICD-10-CM | POA: Diagnosis not present

## 2022-02-11 DIAGNOSIS — I5022 Chronic systolic (congestive) heart failure: Secondary | ICD-10-CM | POA: Insufficient documentation

## 2022-02-11 DIAGNOSIS — I25810 Atherosclerosis of coronary artery bypass graft(s) without angina pectoris: Secondary | ICD-10-CM | POA: Diagnosis present

## 2022-02-11 DIAGNOSIS — N1831 Chronic kidney disease, stage 3a: Secondary | ICD-10-CM

## 2022-02-11 DIAGNOSIS — Z87891 Personal history of nicotine dependence: Secondary | ICD-10-CM | POA: Insufficient documentation

## 2022-02-11 DIAGNOSIS — H93A3 Pulsatile tinnitus, bilateral: Secondary | ICD-10-CM | POA: Insufficient documentation

## 2022-02-11 DIAGNOSIS — Z20822 Contact with and (suspected) exposure to covid-19: Secondary | ICD-10-CM | POA: Diagnosis not present

## 2022-02-11 DIAGNOSIS — I25709 Atherosclerosis of coronary artery bypass graft(s), unspecified, with unspecified angina pectoris: Secondary | ICD-10-CM | POA: Diagnosis present

## 2022-02-11 DIAGNOSIS — E1122 Type 2 diabetes mellitus with diabetic chronic kidney disease: Secondary | ICD-10-CM | POA: Diagnosis not present

## 2022-02-11 DIAGNOSIS — Z955 Presence of coronary angioplasty implant and graft: Secondary | ICD-10-CM | POA: Diagnosis not present

## 2022-02-11 DIAGNOSIS — Z79899 Other long term (current) drug therapy: Secondary | ICD-10-CM | POA: Diagnosis not present

## 2022-02-11 DIAGNOSIS — H9319 Tinnitus, unspecified ear: Secondary | ICD-10-CM | POA: Diagnosis not present

## 2022-02-11 DIAGNOSIS — E119 Type 2 diabetes mellitus without complications: Secondary | ICD-10-CM

## 2022-02-11 DIAGNOSIS — N1832 Chronic kidney disease, stage 3b: Secondary | ICD-10-CM

## 2022-02-11 LAB — URINALYSIS, ROUTINE W REFLEX MICROSCOPIC
Bacteria, UA: NONE SEEN
Bilirubin Urine: NEGATIVE
Glucose, UA: NEGATIVE mg/dL
Ketones, ur: 5 mg/dL — AB
Leukocytes,Ua: NEGATIVE
Nitrite: NEGATIVE
Protein, ur: NEGATIVE mg/dL
Specific Gravity, Urine: 1.019 (ref 1.005–1.030)
pH: 6 (ref 5.0–8.0)

## 2022-02-11 LAB — COMPREHENSIVE METABOLIC PANEL
ALT: 21 U/L (ref 0–44)
AST: 30 U/L (ref 15–41)
Albumin: 4.3 g/dL (ref 3.5–5.0)
Alkaline Phosphatase: 74 U/L (ref 38–126)
Anion gap: 10 (ref 5–15)
BUN: 22 mg/dL (ref 8–23)
CO2: 25 mmol/L (ref 22–32)
Calcium: 9.3 mg/dL (ref 8.9–10.3)
Chloride: 104 mmol/L (ref 98–111)
Creatinine, Ser: 1.51 mg/dL — ABNORMAL HIGH (ref 0.61–1.24)
GFR, Estimated: 47 mL/min — ABNORMAL LOW (ref >60)
Glucose, Bld: 102 mg/dL — ABNORMAL HIGH (ref 70–99)
Potassium: 4.2 mmol/L (ref 3.5–5.1)
Sodium: 139 mmol/L (ref 135–145)
Total Bilirubin: 1 mg/dL (ref 0.3–1.2)
Total Protein: 8 g/dL (ref 6.5–8.1)

## 2022-02-11 LAB — PROTIME-INR
INR: 1.2 (ref 0.8–1.2)
Prothrombin Time: 15.1 seconds (ref 11.4–15.2)

## 2022-02-11 LAB — DIFFERENTIAL
Abs Immature Granulocytes: 0.02 10*3/uL (ref 0.00–0.07)
Basophils Absolute: 0 10*3/uL (ref 0.0–0.1)
Basophils Relative: 1 %
Eosinophils Absolute: 0.6 10*3/uL — ABNORMAL HIGH (ref 0.0–0.5)
Eosinophils Relative: 7 %
Immature Granulocytes: 0 %
Lymphocytes Relative: 34 %
Lymphs Abs: 2.8 10*3/uL (ref 0.7–4.0)
Monocytes Absolute: 0.6 10*3/uL (ref 0.1–1.0)
Monocytes Relative: 7 %
Neutro Abs: 4.3 10*3/uL (ref 1.7–7.7)
Neutrophils Relative %: 51 %

## 2022-02-11 LAB — RESP PANEL BY RT-PCR (FLU A&B, COVID) ARPGX2
Influenza A by PCR: NEGATIVE
Influenza B by PCR: NEGATIVE
SARS Coronavirus 2 by RT PCR: NEGATIVE

## 2022-02-11 LAB — CBC
HCT: 41.4 % (ref 39.0–52.0)
Hemoglobin: 13.9 g/dL (ref 13.0–17.0)
MCH: 32.6 pg (ref 26.0–34.0)
MCHC: 33.6 g/dL (ref 30.0–36.0)
MCV: 97.2 fL (ref 80.0–100.0)
Platelets: 120 10*3/uL — ABNORMAL LOW (ref 150–400)
RBC: 4.26 MIL/uL (ref 4.22–5.81)
RDW: 13.1 % (ref 11.5–15.5)
WBC: 8.2 10*3/uL (ref 4.0–10.5)
nRBC: 0 % (ref 0.0–0.2)

## 2022-02-11 LAB — RAPID URINE DRUG SCREEN, HOSP PERFORMED
Amphetamines: NOT DETECTED
Barbiturates: NOT DETECTED
Benzodiazepines: NOT DETECTED
Cocaine: NOT DETECTED
Opiates: NOT DETECTED
Tetrahydrocannabinol: NOT DETECTED

## 2022-02-11 LAB — ETHANOL: Alcohol, Ethyl (B): <10 mg/dL (ref <10)

## 2022-02-11 LAB — APTT: aPTT: 36 seconds (ref 24–36)

## 2022-02-11 MED ORDER — PREDNISONE 50 MG PO TABS: 50.0000 mg | ORAL_TABLET | Freq: Four times a day (QID) | ORAL | Status: DC

## 2022-02-11 MED ORDER — DIPHENHYDRAMINE HCL 25 MG PO CAPS
50.0000 mg | ORAL_CAPSULE | Freq: Once | ORAL | Status: AC
  Administered 2022-02-12: 50 mg via ORAL
  Filled 2022-02-11: qty 2

## 2022-02-11 MED ORDER — DIPHENHYDRAMINE HCL 50 MG/ML IJ SOLN: 50.0000 mg | Freq: Once | INTRAMUSCULAR | Status: AC

## 2022-02-11 MED ORDER — DIPHENHYDRAMINE HCL 50 MG/ML IJ SOLN
50.0000 mg | Freq: Once | INTRAMUSCULAR | Status: DC
Start: 2022-02-12 — End: 2022-02-11

## 2022-02-11 MED ORDER — PREDNISONE 20 MG PO TABS
50.0000 mg | ORAL_TABLET | Freq: Four times a day (QID) | ORAL | Status: AC
  Administered 2022-02-12 (×3): 50 mg via ORAL
  Filled 2022-02-11 (×3): qty 3

## 2022-02-11 MED ORDER — GADOBUTROL 1 MMOL/ML IV SOLN
10.0000 mL | Freq: Once | INTRAVENOUS | Status: AC | PRN
  Administered 2022-02-11: 7 mL via INTRAVENOUS

## 2022-02-11 MED ORDER — DIPHENHYDRAMINE HCL 25 MG PO CAPS: 50.0000 mg | ORAL_CAPSULE | Freq: Once | ORAL | Status: DC

## 2022-02-11 MED ORDER — HYDROCORTISONE SOD SUC (PF) 100 MG IJ SOLR
200.0000 mg | Freq: Once | INTRAMUSCULAR | Status: DC
Start: 2022-02-11 — End: 2022-02-11

## 2022-02-11 MED ORDER — DIPHENHYDRAMINE HCL 50 MG/ML IJ SOLN: 50.0000 mg | Freq: Once | INTRAMUSCULAR | Status: DC

## 2022-02-11 NOTE — Assessment & Plan Note (Signed)
Platelets 120.  No sign of any bleeding or vomiting of blood or blood in stool ?Continue to monitor platelet levels with morning labs ?

## 2022-02-11 NOTE — Assessment & Plan Note (Signed)
Continue statin. 

## 2022-02-11 NOTE — Assessment & Plan Note (Signed)
CT angiography of abdomen and pelvis with or without contrast done on 01/21/2022 showed Interval enlargement of previously visualized juxtarenal fusiform abdominal aortic aneurysm measuring up to 5.3 cm, previously 4.8 cm.  It was recommended for patient to follow-up with a vascular specialist ?

## 2022-02-11 NOTE — ED Provider Notes (Signed)
[facility] EMERGENCY DEPARTMENT Provider Note   CSN: [id number] Arrival date & time: 02/11/22  1518     History  No chief complaint on file.   Peter [name] is a 81 y.o. male.  Patient reports his primary care doctor sent him for an outpatient MRI today.  The MRI tech brought him to the emergency department due to concerns on his MRI.  Patient reports he has a history of tinnitus.  Patient reports symptoms have recently been worse.  Patient saw his primary care physician in the office due to difficulty resting and hearing his heartbeat in his ears.  Patient denies any weakness he denies any hearing loss he denies any visual loss.  Patient has a past medical history of diabetes dyslipidemia hypertension he has a known aortic aneurysm he has had coronary artery disease and COPD.  The history is provided by the patient. No language interpreter was used.      Home Medications Prior to Admission medications   Medication Sig Start Date End Date Taking? Authorizing Provider  ACCU-CHEK GUIDE test strip USE 1 STRIP TO CHECK GLUCOSE TWICE DAILY DUE  TO  FLUCTUATING  SUGARS 01/03/22   Luking, Elayne Snare, MD  Aloe Vera Juice LIQD Take 8 oz by mouth daily.    [provider]  aspirin EC 81 MG tablet Take 1 tablet (81 mg total) by mouth daily. 02/26/20   Elouise Munroe, MD  HYDROcodone-acetaminophen [facility]) 10-325 MG tablet Take one tablet po BID prn pain . Caution Drowsiness 11/24/21   Kathyrn Drown, MD  HYDROcodone-acetaminophen (NORCO) 10-325 MG tablet Take one tablet po BID prn pain . Caution Drowsiness 11/24/21   Kathyrn Drown, MD  HYDROcodone-acetaminophen (NORCO) 10-325 MG tablet Take one tablet po BID prn pain . Caution Drowsiness 11/24/21   Kathyrn Drown, MD  losartan (COZAAR) 25 MG tablet Take 1 tablet (25 mg total) by mouth daily. 05/12/21   Arnoldo Lenis, MD  metoprolol succinate (TOPROL-XL) 25 MG 24 hr tablet Take 1 tablet (25 mg total) by mouth daily. 08/26/21    Kathyrn Drown, MD  nitroGLYCERIN (NITROSTAT) 0.4 MG SL tablet Place 1 tablet (0.4 mg total) under the tongue every 5 (five) minutes as needed for chest pain. 09/10/19   Kathyrn Drown, MD  predniSONE (DELTASONE) 50 MG tablet One tablet ('50mg'$ ) 13 hours prior to procedure; one tablet ('50mg'$ ) 7 hours prior to procedure and then one tablet (50 mg) one hour prior to procedure. 12/22/21   Waynetta Sandy, MD  rosuvastatin (CRESTOR) 40 MG tablet Take 1 tablet (40 mg total) by mouth daily. 05/20/21   Kathyrn Drown, MD  ticagrelor (BRILINTA) 60 MG TABS tablet Take 1 tablet (60 mg total) by mouth 2 (two) times daily. 07/21/21   Arnoldo Lenis, MD      Allergies    Dye fdc red Scarlett Presto dye], Ivp dye [iodinated contrast media], Plavix [clopidogrel], and Keflex [cephalexin]    Review of Systems   Review of Systems  HENT:  Positive for tinnitus. Negative for hearing loss.   All other systems reviewed and are negative.  Physical Exam Updated Vital Signs BP (!) 166/74    Pulse (!) 59    Temp 97.7 F (36.5 C) (Oral)    Resp 17    Ht 6' (1.829 m)    Wt 78 kg    SpO2 100%    BMI 23.33 kg/m  Physical Exam Vitals and nursing note reviewed.  Constitutional:      Appearance: He is well-developed.  HENT:     Head: Normocephalic.     Nose: Nose normal.     Mouth/Throat:     Mouth: Mucous membranes are moist.  Eyes:     Extraocular Movements: Extraocular movements intact.     Pupils: Pupils are equal, round, and reactive to light.  Cardiovascular:     Rate and Rhythm: Normal rate and regular rhythm.     Heart sounds: Normal heart sounds.  Pulmonary:     Effort: Pulmonary effort is normal.  Abdominal:     General: Abdomen is flat. There is no distension.  Musculoskeletal:        General: Normal range of motion.     Cervical back: Normal range of motion.  Skin:    General: Skin is warm.  Neurological:     General: No focal deficit present.     Mental Status: He is alert and oriented to  person, place, and time.  Psychiatric:        Mood and Affect: Mood normal.    ED Results / Procedures / Treatments   Labs (all labs ordered are listed, but only abnormal results are displayed) Labs Reviewed  CBC - Abnormal; Notable for the following components:      Result Value   Platelets 120 (*)    All other components within normal limits  DIFFERENTIAL - Abnormal; Notable for the following components:   Eosinophils Absolute 0.6 (*)    All other components within normal limits  COMPREHENSIVE METABOLIC PANEL - Abnormal; Notable for the following components:   Glucose, Bld 102 (*)    Creatinine, Ser 1.51 (*)    GFR, Estimated 47 (*)    All other components within normal limits  RESP PANEL BY RT-PCR (FLU A&B, COVID) ARPGX2  ETHANOL  PROTIME-INR  APTT  RAPID URINE DRUG SCREEN, HOSP PERFORMED  URINALYSIS, ROUTINE W REFLEX MICROSCOPIC  I-STAT CHEM 8, ED    EKG EKG Interpretation  Date/Time:  Friday February 11 2022 17:49:55 EST Ventricular Rate:  56 PR Interval:  198 QRS Duration: 118 QT Interval:  490 QTC Calculation: 473 R Axis:   -12 Text Interpretation: Sinus rhythm Incomplete left bundle branch block Since last tracing rate slower Confirmed by Dorie Rank [phone]) on 02/11/2022 6:03:44 PM  Radiology MR Brain W Wo Contrast  Result Date: 02/11/2022 CLINICAL DATA:  pulsitile tinnitus EXAM: MRI HEAD WITHOUT AND WITH CONTRAST TECHNIQUE: Multiplanar, multiecho pulse sequences of the brain and surrounding structures were obtained without and with intravenous contrast. CONTRAST:  66m GADAVIST GADOBUTROL 1 MMOL/ML IV SOLN COMPARISON:  None. FINDINGS: Brain: Multiple punctate acute infarcts bilaterally, including punctate infarcts in bilateral frontal lobes (series 5, images 31, 35, and 38), right basal ganglia (series 5, image 25), right periventricular parieto-occipital region (series 5, image 19). No hydrocephalus, mass lesion, midline shift, or extra-axial fluid collection.  Cerebral atrophy with ex vacuo ventricular dilation. Mild to moderate scattered T2/FLAIR hyperintensities in the white matter, nonspecific but compatible with chronic microvascular ischemic disease. Remote inferior left cerebellar infarct. No abnormal enhancement. Dedicated IAC protocol was performed. The visualized seventh and eighth cranial nerves are unremarkable bilaterally. Normal T2 hyperintense signal within the labyrinth bilaterally. No abnormal enhancement or evidence of a retrocochlear mass. Small right mastoid effusion. Vascular: Abnormal signal within the left intradural vertebral artery on T2 imaging with apparent filling defect versus atherosclerotic plaque on postcontrast imaging. otherwise, major arterial flow voids are maintained at the skull  base. Skull and upper cervical spine: Normal marrow signal. Sinuses/Orbits: Negative. Other: No mastoid effusions. IMPRESSION: 1. Multiple punctate supratentorial acute infarcts bilaterally, detailed above. Given involvement of multiple vascular territories, consider an embolic etiology. No significant mass effect. 2. Abnormal signal within the left intradural vertebral artery on T2 imaging with apparent filling defect versus atherosclerotic plaque on postcontrast imaging. Recommend CTA or MRA to further evaluate. 3. No retrocochlear mass. 4. Cerebral atrophy, chronic microvascular ischemic disease and remote inferior left cerebellar infarct. These results will be called to the ordering clinician or representative by the Radiologist Assistant, and communication documented in the PACS or [employer]. Electronically Signed   By: Margaretha Sheffield M.D.   On: 02/11/2022 17:07    Procedures Procedures    Medications Ordered in ED Medications - No data to display  ED Course/ Medical Decision Making/ A&P                           Medical Decision Making Pt reports increased ringing in his ears and hearing hios heart beat in his ears.   Amount  and/or Complexity of Data Reviewed Independent Historian:     Details: PT"s son is here with him  Irene Shipper)  He reports pt sent for [facility] External Data Reviewed: notes.    Details: Dr. Malachy Moan office notes reviewed Labs: ordered. Decision-making details documented in ED Course.    Details: labs ordered, reviewed and interpreted cbc count is normal.  Covid and influenza are negative Radiology: independent interpretation performed. Decision-making details documented in ED Course.    Details: MRI shows multiple small acute infarcts ECG/medicine tests: ordered and independent interpretation performed. Decision-making details documented in ED Course. Discussion of management or test interpretation with external provider(s): Hospitalist consulted for admission.  Dr. Pierce Crane will see and admit I spoke with Dr. Theda Sers Neurology.  He reviewed MRI and feels pt can be admitted here for further evaluation. He advised pt will need a ct angio head and nec and echo.  He advised continue brillinta and asa.  Dr. Theda Sers advised Neurology can do tele consult in am.     MDM:  MRi shows multiple small acute infarcts.  I am unable to pinpoint a time that pt had cva symptoms.  Pt began having worsening tinnitus symptoms in February and recent concerns about heart beat in his ears.  Dr. Wolfgang Phoenix scheduled pt for an Mri today.          Final Clinical Impression(s) / ED Diagnoses Final diagnoses:  Acute cerebrovascular accident (CVA) [facility])    Rx / DC Orders ED Discharge Orders     None         Sidney Ace 02/11/22 2124    Dorie Rank, MD 02/12/22 1620

## 2022-02-11 NOTE — ED Provider Notes (Addendum)
? ?  EKG Interpretation ? ?Date/Time:  Friday February 11 2022 17:49:55 EST ?Ventricular Rate:  56 ?PR Interval:  198 ?QRS Duration: 118 ?QT Interval:  490 ?QTC Calculation: 473 ?R Axis:   -12 ?Text Interpretation: Sinus rhythm Incomplete left bundle branch block Since last tracing rate slower Confirmed by Dorie Rank [phone]) on 02/11/2022 6:03:44 PM ?Patient seen by primary care doctor.  Had an outpatient MRI that showed evidence of stroke.  Patient sent to the ED for admission. ?9:17 PM patient remains comfortable.  Seen and examined.  I discussed the findings and plan with him this evening.  Neurology has been consulted.  Plan on admission to the hospital for further treatment and evaluation ?  ?  ?Dorie Rank, MD ?02/11/22 2135 ? ?

## 2022-02-11 NOTE — Assessment & Plan Note (Signed)
Last A1c about 2 months ago was 6.8 (less than 7.0) ?Patient was not on any antidiabetic medication ?Continue to monitor CBG and consider ISS based on new findings of A1c ?

## 2022-02-11 NOTE — Assessment & Plan Note (Signed)
BUN/creatinine 22/1.51 (baseline creatinine at 1.3-1.7 ?Renally adjust medications, avoid nephrotoxic agents/dehydration/hypotension ? ?

## 2022-02-11 NOTE — Assessment & Plan Note (Signed)
Permissive hypertension will be allowed at this time ? Antihypertensives PRN if Blood pressure is greater than 220/120 or there is a concern for End organ damage/contraindications for permissive HTN. If blood pressure is greater than 220/120 give labetalol PO or IV or Vasotec IV with a goal of 15% reduction in BP during the first 24 hours. ?  ? ?

## 2022-02-11 NOTE — Assessment & Plan Note (Signed)
Patient will be admitted to telemetry unit  ?CT angiography of head and neck will be done in the morning ?Echocardiogram in the morning ?MRI of brain with and without without contrast showed multiple punctate supratentorial acute infarcts bilaterally with suspicion for embolic etiology. ?Continue aspirin, Brilinta and statin ?Continue fall precautions and neuro checks ?Lipid panel and hemoglobin A1c will be checked ?Continue PT/OT eval and treat ?Bedside swallow eval by nursing prior to diet ?Tele neurology will be consulted in the morning ? ? ?

## 2022-02-11 NOTE — H&P (Signed)
History and Physical    Patient: Peter [name] PJA:[id number] DOB: 1941-03-11 DOA: 02/11/2022 DOS: the patient was seen and examined on 02/11/2022 PCP: Kathyrn Drown, MD  Patient coming from: Outside Hospital  Chief Complaint: No chief complaint on file.  HPI: Peter [name] is a 81 y.o. male with medical history significant of hypertension, hyperlipidemia, CAD s/p CABG (1997), tinnitus, T2DM, CKD 3A who presents to the emergency department after being brought to the ED by an MRI tech.  Patient complained of recent worsening symptoms of tinnitus, so he went to his PCP with complaint of being unable to rest and hearing of his heartbeat in his ears, he was asked to do an outpatient MRI, after the MRI was done, the radiology tech was concerned about the findings, so he was brought to the ED for further evaluation and management.  Patient denies fever, chills, chest pain, shortness of breath, nausea, vomiting, abdominal pain  ED course: In the emergency department, he was hemodynamically stable.  Work-up in the ED showed normal CBC except for thrombocytopenia, BUN/creatinine 22/1.51 (baseline creatinine at 1.3-1.7), urine drug screen was negative, urinalysis was negative for UTI, alcohol level was less than 10.  Influenza A, B, SARS coronavirus 2 was negative. MRI of brain with and without contrast showed multiple punctate supratentorial acute infarcts bilaterally with suspicion for embolic etiology. Neurologist at [facility] (Dr. Theda Sers) was consulted and he advised to do CT angiography of head and neck and echo and for patient to continue with aspirin and Brilinta and the patient can stay here at [facility] and to put in for teleneurology consult in the morning per ED PA.  Hospitalist was asked to admit patient for further evaluation and management.  Review of Systems: As mentioned in the history of present illness. All other systems reviewed and are negative.  Past Medical History:  Diagnosis Date    AAA (abdominal aortic aneurysm)    CAD (coronary artery disease)    a. s/p CABG in 1997 b. stent to SVG-PDA and PTCA of LAD in 2003 c. cath in 2015 showing patent SVG-OM1-OM2, SVG-RI, SVG-D1, and LIMA-LAD with occluded SVG-PDA and severe stenosis of mid-LAD at LIMA insertion with DES placed d. 04/2018: similar results to 2015 with patent LAD stent; occlusion of small caliber D1 stenosis with medical management recommended.    Chronic back pain 07/25/2018   Chronic lumbar pain uses hydrocodone intermittently   Chronic systolic CHF (congestive heart failure) (HCC)    COPD (chronic obstructive pulmonary disease) ([place])    Diabetes mellitus    diet controlled   Dyslipidemia    Hypertension    Myocardial infarction [facility]) 1997   stents x2 (2003/2007)   Past Surgical History:  Procedure Laterality Date   CORONARY ANGIOPLASTY  2003/2007   2 stents   CORONARY ANGIOPLASTY WITH STENT PLACEMENT  08/14/14   DES to LIMA-LAD insertion   CORONARY ARTERY BYPASS GRAFT  1997   7 vessels   CORONARY STENT INTERVENTION N/A 02/24/2020   Procedure: CORONARY STENT INTERVENTION;  Surgeon: Sherren Mocha, MD;  Location: [place] CV [facility];  Service: Cardiovascular;  Laterality: N/A;   CORONARY STENT PLACEMENT  08/15/2014   MID LAD  DES  by Dr Leslye Peer ANGIOGRAPHY N/A 02/24/2020   Procedure: Remus Blake ANGIOGRAPHY;  Surgeon: Sherren Mocha, MD;  Location: [place] CV LAB;  Service: Cardiovascular;  Laterality: N/A;   CYSTECTOMY  2005   top of head-[place]   EAR CYST EXCISION  07/09/2012  Procedure: CYST REMOVAL;  Surgeon: Jamesetta So, MD;  Location: [facility] ORS;  Service: General;  Laterality: N/A;   LEFT HEART CATH AND CORS/GRAFTS ANGIOGRAPHY N/A 04/16/2018   Procedure: LEFT HEART CATH AND CORS/GRAFTS ANGIOGRAPHY;  Surgeon: Martinique, Peter M, MD;  Location: [place] CV LAB;  Service: Cardiovascular;  Laterality: N/A;   LEFT HEART CATHETERIZATION WITH CORONARY/GRAFT ANGIOGRAM N/A 08/14/2014    Procedure: LEFT HEART CATHETERIZATION WITH Beatrix Fetters;  Surgeon: Blane Ohara, MD;  Location[facility] C[facility] LAB;  Service: Cardiovascular;  Laterality: N/A;   Social History:  reports that he quit smoking about 21 years ago. His smoking use included cigarettes. He has a 120.00 pack-year smoking history. He has never used smokeless tobacco. He reports that he does not drink alcohol and does not use drugs.  Allergies  Allergen Reactions   Dye Fdc Red [Red Dye] Hives    hives   Ivp Dye [Iodinated Contrast Media] Hives   Plavix [Clopidogrel] Hives    Unsure if dye or Plavix   Keflex [Cephalexin] Other (See Comments)    hoarsness    Family History  Problem Relation Age of Onset   Other Father        deceased after ?TCS or barium enema, age 15s   Hypertension Father    Heart attack Mother    Colon cancer Neg Hx    Liver disease Neg Hx     Prior to Admission medications   Medication Sig Start Date End Date Taking? Authorizing Provider  ACCU-CHEK GUIDE test strip USE 1 STRIP TO CHECK GLUCOSE TWICE DAILY DUE  TO  FLUCTUATING  SUGARS 01/03/22   Luking, Elayne Snare, MD  Aloe Vera Juice LIQD Take 8 oz by mouth daily.    [provider]  aspirin EC 81 MG tablet Take 1 tablet (81 mg total) by mouth daily. 02/26/20   Elouise Munroe, MD  HYDROcodone-acetaminophen [facility]) 10-325 MG tablet Take one tablet po BID prn pain . Caution Drowsiness 11/24/21   Kathyrn Drown, MD  HYDROcodone-acetaminophen (NORCO) 10-325 MG tablet Take one tablet po BID prn pain . Caution Drowsiness 11/24/21   Kathyrn Drown, MD  HYDROcodone-acetaminophen (NORCO) 10-325 MG tablet Take one tablet po BID prn pain . Caution Drowsiness 11/24/21   Kathyrn Drown, MD  losartan (COZAAR) 25 MG tablet Take 1 tablet (25 mg total) by mouth daily. 05/12/21   Arnoldo Lenis, MD  metoprolol succinate (TOPROL-XL) 25 MG 24 hr tablet Take 1 tablet (25 mg total) by mouth daily. 08/26/21   Kathyrn Drown, MD   nitroGLYCERIN (NITROSTAT) 0.4 MG SL tablet Place 1 tablet (0.4 mg total) under the tongue every 5 (five) minutes as needed for chest pain. 09/10/19   Kathyrn Drown, MD  predniSONE (DELTASONE) 50 MG tablet One tablet ('50mg'$ ) 13 hours prior to procedure; one tablet ('50mg'$ ) 7 hours prior to procedure and then one tablet (50 mg) one hour prior to procedure. 12/22/21   Waynetta Sandy, MD  rosuvastatin (CRESTOR) 40 MG tablet Take 1 tablet (40 mg total) by mouth daily. 05/20/21   Kathyrn Drown, MD  ticagrelor (BRILINTA) 60 MG TABS tablet Take 1 tablet (60 mg total) by mouth 2 (two) times daily. 07/21/21   Arnoldo Lenis, MD    Physical Exam: Vitals:   02/11/22 1900 02/11/22 1930 02/11/22 2000 02/11/22 2219  BP: (!) 148/90 (!) 147/67 (!) 147/69 (!) 183/91  Pulse: 61 62 62 69  Resp: 19 14 17  18  Temp:    97.6 F (36.4 C)  TempSrc:    Oral  SpO2: 97% 96% 98% 100%  Weight:      Height:       General: Elderly male. Awake and alert and oriented x3. Not in any acute distress.  HEENT: NCAT.  PERRLA. EOMI. Sclerae anicteric.  Moist mucosal membranes. Neck: Neck supple without lymphadenopathy. No carotid bruits. No masses palpated.  Cardiovascular: Regular rate with normal S1-S2 sounds. No murmurs, rubs or gallops auscultated. No JVD.  Respiratory: Clear breath sounds.  No accessory muscle use. Abdomen: Soft, nontender, nondistended. Active bowel sounds. No masses or hepatosplenomegaly  Skin: No rashes, lesions, or ulcerations.  Dry, warm to touch. Musculoskeletal:  2+ dorsalis pedis and radial pulses. Good ROM.  No contractures  Psychiatric: Intact judgment and insight.  Mood appropriate to current condition. Neurologic: No focal neurological deficits. Strength is 5/5 x 4.  CN II - XII grossly intact.  Data Reviewed: EKG personally reviewed showed normal sinus rhythm at rate of 63 bpm with incomplete RBBB  Assessment and Plan: * Acute CVA (cerebrovascular accident) [facility]) Patient  will be admitted to telemetry unit  CT angiography of head and neck will be done in the morning Echocardiogram in the morning MRI of brain with and without without contrast showed multiple punctate supratentorial acute infarcts bilaterally with suspicion for embolic etiology. Continue aspirin, Brilinta and statin Continue fall precautions and neuro checks Lipid panel and hemoglobin A1c will be checked Continue PT/OT eval and treat Bedside swallow eval by nursing prior to diet Tele neurology will be consulted in the morning    Chronic kidney disease, stage 3a (HCC) BUN/creatinine 22/1.51 (baseline creatinine at 1.3-1.7 Renally adjust medications, avoid nephrotoxic agents/dehydration/hypotension   CAD-CABG '97- multiple PCIs since Continue aspirin, Brilinta, Crestor Toprol XL will be temporarily held due to patient's acute stroke  Chronic idiopathic thrombocytopenia (HCC) Platelets 120.  No sign of any bleeding or vomiting of blood or blood in stool Continue to monitor platelet levels with morning labs  AAA (abdominal aortic aneurysm) without rupture CT angiography of abdomen and pelvis with or without contrast done on 01/21/2022 showed Interval enlargement of previously visualized juxtarenal fusiform abdominal aortic aneurysm measuring up to 5.3 cm, previously 4.8 cm.  It was recommended for patient to follow-up with a vascular specialist  HTN (hypertension), benign Permissive hypertension will be allowed at this time  Antihypertensives PRN if Blood pressure is greater than 220/120 or there is a concern for End organ damage/contraindications for permissive HTN. If blood pressure is greater than 220/120 give labetalol PO or IV or Vasotec IV with a goal of 15% reduction in BP during the first 24 hours.     Type 2 diabetes mellitus (HCC) Last A1c about 2 months ago was 6.8 (less than 7.0) Patient was not on any antidiabetic medication Continue to monitor CBG and consider ISS based on  new findings of A1c  Dyslipidemia Continue statin      Advance Care Planning:   Code Status: Full Code   Consults: Teleneurology  Family Communication: None at bedside  Severity of Illness: The appropriate patient status for this patient is OBSERVATION. Observation status is judged to be reasonable and necessary in order to provide the required intensity of service to ensure the patient's safety. The patient's presenting symptoms, physical exam findings, and initial radiographic and laboratory data in the context of their medical condition is felt to place them at decreased risk for further clinical deterioration. Furthermore,  it is anticipated that the patient will be medically stable for discharge from the hospital within 2 midnights of admission.   Author: Bernadette Hoit, DO 02/11/2022 10:50 PM  For on call review [email or web address].

## 2022-02-11 NOTE — ED Triage Notes (Signed)
Sent from radiology for followup on MRI ?

## 2022-02-11 NOTE — Assessment & Plan Note (Signed)
Continue aspirin, Brilinta, Crestor ?Toprol XL will be temporarily held due to patient's acute stroke ?

## 2022-02-12 ENCOUNTER — Observation Stay (HOSPITAL_COMMUNITY): Payer: Medicare Other

## 2022-02-12 ENCOUNTER — Observation Stay (HOSPITAL_BASED_OUTPATIENT_CLINIC_OR_DEPARTMENT_OTHER): Payer: Medicare Other

## 2022-02-12 DIAGNOSIS — I714 Abdominal aortic aneurysm, without rupture, unspecified: Secondary | ICD-10-CM | POA: Diagnosis not present

## 2022-02-12 DIAGNOSIS — I6381 Other cerebral infarction due to occlusion or stenosis of small artery: Secondary | ICD-10-CM | POA: Diagnosis not present

## 2022-02-12 DIAGNOSIS — I6389 Other cerebral infarction: Secondary | ICD-10-CM | POA: Diagnosis not present

## 2022-02-12 DIAGNOSIS — I6503 Occlusion and stenosis of bilateral vertebral arteries: Secondary | ICD-10-CM | POA: Diagnosis not present

## 2022-02-12 DIAGNOSIS — I6523 Occlusion and stenosis of bilateral carotid arteries: Secondary | ICD-10-CM | POA: Diagnosis not present

## 2022-02-12 DIAGNOSIS — I6603 Occlusion and stenosis of bilateral middle cerebral arteries: Secondary | ICD-10-CM | POA: Diagnosis not present

## 2022-02-12 LAB — CBC
HCT: 39.9 % (ref 39.0–52.0)
Hemoglobin: 13.1 g/dL (ref 13.0–17.0)
MCH: 31.6 pg (ref 26.0–34.0)
MCHC: 32.8 g/dL (ref 30.0–36.0)
MCV: 96.1 fL (ref 80.0–100.0)
Platelets: 124 10*3/uL — ABNORMAL LOW (ref 150–400)
RBC: 4.15 MIL/uL — ABNORMAL LOW (ref 4.22–5.81)
RDW: 12.9 % (ref 11.5–15.5)
WBC: 8.8 10*3/uL (ref 4.0–10.5)
nRBC: 0 % (ref 0.0–0.2)

## 2022-02-12 LAB — COMPREHENSIVE METABOLIC PANEL
ALT: 19 U/L (ref 0–44)
AST: 24 U/L (ref 15–41)
Albumin: 3.7 g/dL (ref 3.5–5.0)
Alkaline Phosphatase: 69 U/L (ref 38–126)
Anion gap: 7 (ref 5–15)
BUN: 23 mg/dL (ref 8–23)
CO2: 24 mmol/L (ref 22–32)
Calcium: 8.8 mg/dL — ABNORMAL LOW (ref 8.9–10.3)
Chloride: 106 mmol/L (ref 98–111)
Creatinine, Ser: 1.46 mg/dL — ABNORMAL HIGH (ref 0.61–1.24)
GFR, Estimated: 49 mL/min — ABNORMAL LOW (ref >60)
Glucose, Bld: 148 mg/dL — ABNORMAL HIGH (ref 70–99)
Potassium: 4.4 mmol/L (ref 3.5–5.1)
Sodium: 137 mmol/L (ref 135–145)
Total Bilirubin: 0.6 mg/dL (ref 0.3–1.2)
Total Protein: 7.2 g/dL (ref 6.5–8.1)

## 2022-02-12 LAB — GLUCOSE, RANDOM: Glucose, Bld: 383 mg/dL — ABNORMAL HIGH (ref 70–99)

## 2022-02-12 LAB — ECHOCARDIOGRAM COMPLETE
Area-P 1/2: 2.69 cm2
Height: 72 in
S' Lateral: 3.5 cm
Weight: 2752 oz

## 2022-02-12 LAB — LIPID PANEL
Cholesterol: 101 mg/dL (ref 0–200)
HDL: 39 mg/dL — ABNORMAL LOW (ref >40)
LDL Cholesterol: 52 mg/dL (ref 0–99)
Total CHOL/HDL Ratio: 2.6 RATIO
Triglycerides: 50 mg/dL (ref <150)
VLDL: 10 mg/dL (ref 0–40)

## 2022-02-12 LAB — MAGNESIUM: Magnesium: 2.1 mg/dL (ref 1.7–2.4)

## 2022-02-12 LAB — PHOSPHORUS: Phosphorus: 2.8 mg/dL (ref 2.5–4.6)

## 2022-02-12 MED ORDER — IOHEXOL 350 MG/ML SOLN
75.0000 mL | Freq: Once | INTRAVENOUS | Status: AC | PRN
  Administered 2022-02-12: 75 mL via INTRAVENOUS

## 2022-02-12 MED ORDER — ASPIRIN EC 81 MG PO TBEC
81.0000 mg | DELAYED_RELEASE_TABLET | Freq: Every day | ORAL | Status: DC
  Administered 2022-02-12 – 2022-02-14 (×3): 81 mg via ORAL
  Filled 2022-02-12 (×3): qty 1

## 2022-02-12 MED ORDER — ROSUVASTATIN CALCIUM 20 MG PO TABS
40.0000 mg | ORAL_TABLET | Freq: Every day | ORAL | Status: DC
  Administered 2022-02-12 – 2022-02-14 (×3): 40 mg via ORAL
  Filled 2022-02-12 (×3): qty 2

## 2022-02-12 MED ORDER — TICAGRELOR 60 MG PO TABS
60.0000 mg | ORAL_TABLET | Freq: Two times a day (BID) | ORAL | Status: DC
Start: 2022-02-12 — End: 2022-02-14
  Administered 2022-02-12 – 2022-02-14 (×5): 60 mg via ORAL
  Filled 2022-02-12 (×9): qty 1

## 2022-02-12 MED ORDER — INSULIN ASPART 100 UNIT/ML IJ SOLN
0.0000 [IU] | Freq: Three times a day (TID) | INTRAMUSCULAR | Status: DC
  Administered 2022-02-13: 2 [IU] via SUBCUTANEOUS
  Administered 2022-02-13: 3 [IU] via SUBCUTANEOUS

## 2022-02-12 NOTE — Evaluation (Addendum)
Physical Therapy Evaluation ?Patient Details ?Name: Peter [name] ?MRN: [id number] ?DOB: [birth year] ?Today's Date: 02/12/2022 ? ?History of Present Illness ? Peter [name] is a 81 y.o. male with medical history significant of hypertension, hyperlipidemia, CAD s/p CABG (1997), tinnitus, T2DM, CKD 3A who presents to the emergency department after being brought to the ED by an MRI tech.  Patient complained of recent worsening symptoms of tinnitus, so he went to his PCP with complaint of being unable to rest and hearing of his heartbeat in his ears, he was asked to do an outpatient MRI, after the MRI was done, the radiology tech was concerned about the findings  ?Clinical Impression ? Pt I in bed mobility; has decreased standing dynamic balance and would benefit from OP PT    ?   ? ?Recommendations for follow up therapy are one component of a multi-disciplinary discharge planning process, led by the attending physician.  Recommendations may be updated based on patient status, additional functional criteria and insurance authorization. ? ?Follow Up Recommendations Outpatient PT ? ?  ?Assistance Recommended at Discharge Intermittent Supervision/Assistance  ?Patient can return home with the following ? Assistance with cooking/housework;Help with stairs or ramp for entrance ? ?  ?Equipment Recommendations None recommended by PT (Pt states that he has three rolling walkers)  ?Recommendations for Other Services ?    ?  ?Functional Status Assessment Patient has had a recent decline in their functional status and demonstrates the ability to make significant improvements in function in a reasonable and predictable amount of time.  ? ?  ?Precautions / Restrictions Precautions ?Precautions: None ?Restrictions ?Weight Bearing Restrictions: No  ? ?  ? ?Mobility ? Bed Mobility ?Overal bed mobility: Independent ?  ?  ?  ?  ?  ?  ?  ?  ? ?Transfers ?Overall transfer level: Independent ?Equipment used: None ?  ?  ?  ?  ?  ?  ?  ?  ?   ? ?Ambulation/Gait ?Ambulation/Gait assistance: Modified independent (Device/Increase time) ?Gait Distance (Feet): 150 Feet ?Assistive device: Rolling walker (2 wheels) ?Gait Pattern/deviations: WFL(Within Functional Limits) ?  ?  ?  ?  ? ?  ? ? ? ? ?Pertinent Vitals/Pain Pain Assessment ?Pain Assessment: No/denies pain  ? ? ?Home Living Family/patient expects to be discharged to:: Private residence ?Living Arrangements: Spouse/significant other (Pt takes care of his wife.  Family available intermittently) ?Available Help at Discharge: Family;Available PRN/intermittently ?Type of Home: House ?Home Access: Ramped entrance ?  ?  ?  ?Home Layout: One level ?Home Equipment: Conservation officer, nature (2 wheels) ?   ?  ?Prior Function Prior Level of Function : Independent/Modified Independent (occasionally used a cane to ambulate with) ?  ?  ?  ?  ?  ?  ?  ?  ?  ? ? ?Hand Dominance  ? Dominant Hand: Right ? ?  ?Extremity/Trunk Assessment  ?   ?  ? ?Lower Extremity Assessment ?Lower Extremity Assessment: Overall WFL for tasks assessed ?  ? ?   ?Communication  ? Communication: No difficulties  ?Cognition Arousal/Alertness: Awake/alert ?Behavior During Therapy: [facility] for tasks assessed/performed ?Overall Cognitive Status: Within Functional Limits for tasks assessed ?  ?  ?  ?  ?  ?  ?  ?  ?  ?  ?  ?  ?  ?  ?  ?  ?  ?  ?  ? ?  ?   ?   ? ?Assessment/Plan Pt to be seen three times a  week for improved balance and steps   ?PT Assessment   ?PT Problem List Decreased balance ? ?   ?  ?   ? ?PT Goals (Current goals can be found in the Care Plan section)  ?Acute Rehab PT Goals ?Patient Stated Goal: to go home ?PT Goal Formulation: With patient ?Potential to Achieve Goals: Good ? ?  ?   ? ? ?   ?AM-PAC PT "6 Clicks" Mobility  ?Outcome Measure Help needed turning from your back to your side while in a flat bed without using bedrails?: None ?Help needed moving from lying on your back to sitting on the side of a flat bed without using bedrails?:  None ?Help needed moving to and from a bed to a chair (including a wheelchair)?: None ?Help needed standing up from a chair using your arms (e.g., wheelchair or bedside chair)?: None ?Help needed to walk in hospital room?: A Little ?Help needed climbing 3-5 steps with a railing? : A Lot ?6 Click Score: 21 ? ?  ?End of Session Equipment Utilized During Treatment: Gait belt ?Activity Tolerance: Patient tolerated treatment well ?Patient left: in chair;with family/visitor present ?  ?PT Visit Diagnosis: Unsteadiness on feet (R26.81) ?  ? ?Time: 6568-1275 ?PT Time Calculation (min) (ACUTE ONLY): 30 min ? ? ?Charges:   PT Evaluation ?$PT Eval Low Complexity: 1 Low ?  ?  ?   ? ? ?Rayetta Humphrey, PT CLT ?[phone]  ?02/12/2022, 11:55 AM ? ?

## 2022-02-12 NOTE — Progress Notes (Signed)
PROGRESS NOTE     Peter [name], is a 81 y.o. male, DOB - 02/08/1941, NIO:[id number]  Admit date - 02/11/2022   Admitting Physician Bernadette Hoit, DO  Outpatient Primary MD for the patient is Luking, Elayne Snare, MD  LOS - 0  No chief complaint on file.       Brief Narrative:   81 y.o. male with medical history significant of hypertension, hyperlipidemia, CAD s/p CABG (1997), tinnitus, T2DM, CKD 3A admitted on 02/11/2022 with acute strokes in multiple vascular territories raising concerns for possible embolic phenomena    -Assessment and Plan:   1)Acute CVA (cerebrovascular accident) ([place]) -- MRI brain showed Multiple punctate supratentorial acute infarcts bilaterally, Given involvement of multiple vascular territories, consider an embolic etiology -Continue aspirin, Brilinta and Crestor -Patient may need DOAC if any evidence of DVT with PFO or, Intracardiac thrombus or A-fib or severely low EF on echo -Discussed with neurologist Dr. Chelsea Primus TEE -If TEE is negative patient will need Zio patch/heart monitor to rule out A-fib ---CTA head and neck with no LVO however patient has  80% bilateral proximal ICA stenoses. Prominent soft plaque in both carotid bulbs with plaque ulceration and Severe left V4 stenosis----??? 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 This was intervened with drug-eluting stent in September of 2015 -c/n aspirin, Brilinta and Crestor -Metoprolol on hold due to acute stroke to allow for blood pressure  4)Chronic kidney disease, stage 3a (HCC) BUN/creatinine 22/1.51 (baseline creatinine at 1.3-1.7 Renally adjust medications, avoid nephrotoxic agents/dehydration/hypotension  5)Chronic idiopathic thrombocytopenia (HCC) Platelets 120.  No sign of any bleeding or vomiting of blood or blood in stool  6)Type 2 diabetes mellitus (HCC) A1c typically for patient is less than 7 reflecting excellent diabetic control PTA -PT A1c pending Patient was not on any antidiabetic medication Use Novolog/Humalog Sliding scale insulin with Accu-Cheks/Fingersticks as ordered   7)HTN--allow permissive hypertension due to acute stroke so hold losartan and metoprolol  Disposition/Need for in-Hospital Stay- patient unable to be discharged at this time due to --acute strokes requiring TEE and monitoring on telemetry unit*  Disposition: The patient is from: Home              Anticipated d/c is to: Home              Anticipated d/c date is: 2 days              Patient currently is not medically stable to d/c. Barriers: Not Clinically Stable-   Code Status :  -  Code Status: Full Code   Family Communication:    (patient is alert, awake and coherent) Discussed with pts son Antony Haste at bedside  DVT Prophylaxis  :   - SCDs  SCDs Start: 02/11/22 2237   Lab Results  Component Value Date   PLT 124 (L) 02/12/2022    Inpatient Medications  Scheduled Meds:  aspirin EC  81  mg Oral Daily   rosuvastatin  40 mg Oral Daily   ticagrelor  60 mg Oral BID   Continuous Infusions: PRN Meds:.   Anti-infectives (From admission, onward)    None         Subjective: Peter [name] today has no fevers, no emesis,  No chest pain,   -Patient's son Zenia Resides at bedside, questions answered -Tinnitus and dizziness is not  worse  Objective: Vitals:   02/12/22 0547 02/12/22 0747 02/12/22 1010 02/12/22 1317  BP: (!) 152/69 (!) 167/74 (!) 143/64 (!) 142/64  Pulse: (!) 58 63 66 71  Resp: '18 18 18 20  '$ Temp: 98.1 F (36.7 C) (!) 97.5 F (36.4 C) 97.8 F (36.6 C) 98.7 F (37.1 C)  TempSrc: Oral Axillary Oral Oral  SpO2: 99% 99%  100%  Weight:      Height:        Intake/Output Summary (Last 24 hours) at 02/12/2022 1723 Last data filed at 02/12/2022 1300 Gross per 24 hour  Intake 360 ml  Output 200 ml  Net 160 ml   Filed Weights   02/11/22 1536  Weight: 78 kg    Physical Exam  Gen:- Awake Alert,  in no apparent distress  HEENT:- [place].AT, No sclera icterus Neck-Supple Neck,No JVD,.  Lungs-  CTAB , fair symmetrical air movement CV- S1, S2 normal, regular  Abd-  +ve B.Sounds, Abd Soft, No tenderness,    Extremity/Skin:- No  edema, pedal pulses present  Psych-affect is appropriate, oriented x3 Neuro-dizziness and tinnitus, no new focal deficits, no tremors  Data Reviewed: I have personally reviewed following labs and imaging studies  CBC: Recent Labs  Lab 02/11/22 1748 02/12/22 0506  WBC 8.2 8.8  NEUTROABS 4.3  --   HGB 13.9 13.1  HCT 41.4 39.9  MCV 97.2 96.1  PLT 120* 761*   Basic Metabolic Panel: Recent Labs  Lab 02/11/22 1748 02/12/22 0506  NA 139 137  K 4.2 4.4  CL 104 106  CO2 25 24  GLUCOSE 102* 148*  BUN 22 23  CREATININE 1.51* 1.46*  CALCIUM 9.3 8.8*  MG  --  2.1  PHOS  --  2.8   GFR: Estimated Creatinine Clearance: 45 mL/min (A) (by C-G formula based on SCr of 1.46 mg/dL (H)). Liver Function Tests: Recent Labs  Lab 02/11/22 1748 02/12/22 0506  AST 30 24  ALT 21 19  ALKPHOS 74 69  BILITOT 1.0 0.6  PROT 8.0 7.2  ALBUMIN 4.3 3.7   Cardiac Enzymes: No results for input(s): CKTOTAL, CKMB, CKMBINDEX, TROPONINI in the last 168 hours. BNP (last 3 results) No results for input(s): PROBNP in the last 8760 hours. HbA1C: No results for input(s): HGBA1C in the last  72 hours. Sepsis Labs: '@LABRCNTIP'$ (procalcitonin:4,lacticidven:4) ) Recent Results (from the past 240 hour(s))  Resp Panel by RT-PCR (Flu A&B, Covid) Nasopharyngeal Swab     Status: None   Collection Time: 02/11/22  6:06 PM   Specimen: Nasopharyngeal Swab; Nasopharyngeal(NP) swabs in vial transport medium  Result Value Ref Range Status   SARS Coronavirus 2 by RT PCR NEGATIVE NEGATIVE Final    Comment: (NOTE) SARS-CoV-2 target nucleic acids are NOT DETECTED.  The SARS-CoV-2 RNA is generally detectable in upper respiratory specimens during the acute phase of infection. The lowest concentration of SARS-CoV-2 viral copies this assay can detect is 138 copies/mL. A negative result does not preclude SARS-Cov-2 infection and should not be used as the sole basis for treatment or other patient management decisions. A  negative result may occur with  improper specimen collection/handling, submission of specimen other than nasopharyngeal swab, presence of viral mutation(s) within the areas targeted by this assay, and inadequate number of viral copies(<138 copies/mL). A negative result must be combined with clinical observations, patient history, and epidemiological information. The expected result is Negative.  Fact Sheet for Patients:  EntrepreneurPulse.com.au  Fact Sheet for Healthcare Providers:  IncredibleEmployment.be  This test is no t yet approved or cleared by the [place] FDA and  has been authorized for detection and/or diagnosis of SARS-CoV-2 by FDA under an Emergency Use Authorization (EUA). This EUA will remain  in effect (meaning this test can be used) for the duration of the COVID-19 declaration under Section 564(b)(1) of the Act, 21 U.S.C.section 360bbb-3(b)(1), unless the authorization is terminated  or revoked sooner.       Influenza A by PCR NEGATIVE NEGATIVE Final   Influenza B by PCR NEGATIVE NEGATIVE Final    Comment:  (NOTE) The Xpert Xpress SARS-CoV-2/FLU/RSV plus assay is intended as an aid in the diagnosis of influenza from Nasopharyngeal swab specimens and should not be used as a sole basis for treatment. Nasal washings and aspirates are unacceptable for Xpert Xpress SARS-CoV-2/FLU/RSV testing.  Fact Sheet for Patients: EntrepreneurPulse.com.au  Fact Sheet for Healthcare Providers: IncredibleEmployment.be  This test is not yet approved or cleared by the [place] FDA and has been authorized for detection and/or diagnosis of SARS-CoV-2 by FDA under an Emergency Use Authorization (EUA). This EUA will remain in effect (meaning this test can be used) for the duration of the COVID-19 declaration under Section 564(b)(1) of the Act, 21 U.S.C. section 360bbb-3(b)(1), unless the authorization is terminated or revoked.  Performed at [facility], [address] [place], [place] [address]       Radiology Studies: CT ANGIO HEAD NECK W WO CM  Result Date: 02/12/2022 CLINICAL DATA:  Neuro deficit, acute, stroke suspected. Punctate acute infarcts on head MRI yesterday. EXAM: CT ANGIOGRAPHY HEAD AND NECK TECHNIQUE: Multidetector CT imaging of the head and neck was performed using the standard protocol during bolus administration of intravenous contrast. Multiplanar CT image reconstructions and MIPs were obtained to evaluate the vascular anatomy. Carotid stenosis measurements (when applicable) are obtained utilizing NASCET criteria, using the distal internal carotid diameter as the denominator. RADIATION DOSE REDUCTION: This exam was performed according to the departmental dose-optimization program which includes automated exposure control, adjustment of the mA and/or kV according to patient size and/or use of iterative reconstruction technique. CONTRAST:  53m OMNIPAQUE IOHEXOL 350 MG/ML SOLN COMPARISON:  Head MRI 02/11/2022.  Head and neck CTA 04/12/2018. FINDINGS: CT HEAD  FINDINGS Brain: No acute large territory infarct, intracranial hemorrhage, mass, midline shift, or extra-axial fluid collection is identified. The punctate infarcts on yesterday's MRI are not well demonstrated by CT. There is mild cerebral atrophy. Hypodensities in the cerebral white matter bilaterally are nonspecific but compatible with mild chronic small vessel ischemic disease. Chronic lacunar infarcts are noted in the basal ganglia. A small chronic left cerebellar infarct is also again noted. Vascular: Calcified atherosclerosis at the skull base. Skull: No fracture or suspicious osseous lesion. Sinuses: Paranasal sinuses and mastoid air cells are clear. Orbits: Right cataract extraction. Review of the MIP images confirms the above findings CTA NECK FINDINGS Aortic arch: Standard 3 vessel aortic arch with a moderate amount of calcified and soft plaque in the arch and included distal descending thoracic aorta. Atherosclerotic irregularity of the brachiocephalic and subclavian arteries without a flow  limiting stenosis. Right carotid system: Patent with extensive calcified and soft plaque in the carotid bulb resulting in 75-80% proximal ICA stenosis, greatly progressed from the prior CTA. 4 mm focus of plaque ulceration posteriorly in the bulb. Mild atherosclerotic irregularity of the distal cervical ICA without significant stenosis. Left carotid system: Patent with extensive calcified and soft plaque in the carotid bulb resulting in a new 80% stenosis at the distal aspect of the bulb. Mild plaque ulceration. Vertebral arteries: Patent without evidence of a dissection or significant stenosis on the left. New mild stenosis of the right vertebral artery near the V3-V4 junction. Skeleton: Cervical disc degeneration greatest at C5-6. Other neck: No evidence of cervical lymphadenopathy or mass. Upper chest: Mild centrilobular and paraseptal emphysema. No apical lung consolidation or mass. Review of the MIP images  confirms the above findings CTA HEAD FINDINGS Anterior circulation: The internal carotid arteries are patent from skull base to carotid termini with atherosclerotic plaque resulting in progressive, moderate bilateral supraclinoid stenoses. ACAs and MCAs are patent without evidence of a proximal branch occlusion. There are new bilateral M1 stenoses. No aneurysm is identified. Posterior circulation: The intracranial vertebral arteries are patent to the basilar with progressive atherosclerosis resulting in up to mild right and severe left V4 segment stenoses. The basilar artery is patent with atherosclerotic irregularity but no significant stenosis. Posterior communicating arteries are diminutive or absent. Both PCAs are patent, and there is a new moderate mid right P2 stenosis. There is mild narrowing of the left P2 segment which is less prominent than on the prior CTA. No aneurysm is identified. Venous sinuses: Absent opacification of the left sigmoid sinus which is attributed to arterial dominant contrast timing and slower flow in this nondominant sinus rather than occlusion given normal opacification on yesterday's MRI. Anatomic variants: None. Review of the MIP images confirms the above findings IMPRESSION: 1. Progressive atherosclerosis in the head and neck. No large vessel occlusion. 2. 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 Prominent soft plaque in both carotid bulbs with plaque ulceration. 3. Moderate bilateral intracranial ICA stenoses. 4. Severe left V4 stenosis. 5. Aortic Atherosclerosis (ICD10-I70.0) and Emphysema (ICD10-J43.9). Electronically Signed   By: Logan Bores M.D.   On: 02/12/2022 13:04   MR Brain W Wo Contrast  Result Date: 02/11/2022 CLINICAL DATA:  pulsitile tinnitus EXAM: MRI HEAD WITHOUT AND WITH CONTRAST TECHNIQUE: Multiplanar, multiecho pulse sequences of the brain and surrounding structures were obtained without and with intravenous contrast. CONTRAST:  46m GADAVIST GADOBUTROL 1 MMOL/ML  IV SOLN COMPARISON:  None. FINDINGS: Brain: Multiple punctate acute infarcts bilaterally, including punctate infarcts in bilateral frontal lobes (series 5, images 31, 35, and 38), right basal ganglia (series 5, image 25), right periventricular parieto-occipital region (series 5, image 19). No hydrocephalus, mass lesion, midline shift, or extra-axial fluid collection. Cerebral atrophy with ex vacuo ventricular dilation. Mild to moderate scattered T2/FLAIR hyperintensities in the white matter, nonspecific but compatible with chronic microvascular ischemic disease. Remote inferior left cerebellar infarct. No abnormal enhancement. Dedicated IAC protocol was performed. The visualized seventh and eighth cranial nerves are unremarkable bilaterally. Normal T2 hyperintense signal within the labyrinth bilaterally. No abnormal enhancement or evidence of a retrocochlear mass. Small right mastoid effusion. Vascular: Abnormal signal within the left intradural vertebral artery on T2 imaging with apparent filling defect versus atherosclerotic plaque on postcontrast imaging. otherwise, major arterial flow voids are maintained at the skull base. Skull and upper cervical spine: Normal marrow signal. Sinuses/Orbits: Negative. Other: No mastoid effusions. IMPRESSION: 1. Multiple punctate  supratentorial acute infarcts bilaterally, detailed above. Given involvement of multiple vascular territories, consider an embolic etiology. No significant mass effect. 2. Abnormal signal within the left intradural vertebral artery on T2 imaging with apparent filling defect versus atherosclerotic plaque on postcontrast imaging. Recommend CTA or MRA to further evaluate. 3. No retrocochlear mass. 4. Cerebral atrophy, chronic microvascular ischemic disease and remote inferior left cerebellar infarct. These results will be called to the ordering clinician or representative by the Radiologist Assistant, and communication documented in the PACS or [employer]. Electronically Signed   By: Margaretha Sheffield M.D.   On: 02/11/2022 17:07   ECHOCARDIOGRAM COMPLETE  Result Date: 02/12/2022    ECHOCARDIOGRAM REPORT   Patient Name:   Peter [name] Date of Exam: 02/12/2022 Medical Rec #:  [id number]      Height:       72.0 in Accession #:    [id number]     Weight:       172.0 lb Date of Birth:  1941-01-15      BSA:          1.998 m Patient Age:    21 years       BP:           142/64 mmHg Patient Gender: M              HR:           71 bpm. Exam Location:  Inpatient Procedure: 2D Echo, Cardiac Doppler and Color Doppler Indications:    Stroke I63.9  History:        Patient has prior history of Echocardiogram examinations, most                 recent 02/25/2020. CAD and Previous Myocardial Infarction, COPD;                 Risk Factors:Hypertension, Diabetes, Dyslipidemia and Former                 Smoker. AAA.  Sonographer:    Darlina Sicilian RDCS Referring Phys: [id number] OLADAPO ADEFESO IMPRESSIONS  1. Left ventricular ejection fraction, by estimation, is 50 to 55%. The left ventricle has low normal function. Wall motion difficult to assess due to poor visualization of the LV endocardium, however, the inferior wall appears hypokinetic based on limited views. There is mild concentric left ventricular hypertrophy. Left ventricular diastolic parameters are consistent with Grade I diastolic dysfunction (impaired relaxation).  2. Right ventricular systolic function is normal. The right ventricular size is normal.  3. Left atrial size was moderately dilated.  4. The mitral valve is abnormal. Mild mitral valve regurgitation. Moderate mitral annular calcification.  5. The aortic valve is tricuspid. There is mild calcification of the aortic valve. There is mild thickening of the aortic valve. Aortic valve regurgitation is mild. Aortic valve sclerosis/calcification is present, without any evidence of aortic stenosis.  6. Aortic dilatation noted. There is borderline dilatation of  the aortic root, measuring 38 mm. Comparison(s): Compared to prior TTE in 02/2020, the LVEF has improved slightly to 50-55% (previously 40-45%). The inferior WMA persist. Conclusion(s)/Recommendation(s): No intracardiac source of embolism detected on this transthoracic study. Consider a transesophageal echocardiogram to exclude cardiac source of embolism if clinically indicated. FINDINGS  Left Ventricle: Left ventricular ejection fraction, by estimation, is 50 to 55%. The left ventricle has low normal function. Difficult to assess wall motion due to incomplete visualization of LV endocardium, however, the inferior wall appears hypokinetic. The left ventricular  internal cavity size was normal in size. There is mild concentric left ventricular hypertrophy. Left ventricular diastolic parameters are consistent with Grade I diastolic dysfunction (impaired relaxation). Right Ventricle: The right ventricular size is normal. Right vetricular wall thickness was not well visualized. Right ventricular systolic function is normal. Left Atrium: Left atrial size was moderately dilated. Right Atrium: Right atrial size was normal in size. Pericardium: There is no evidence of pericardial effusion. Mitral Valve: The mitral valve is abnormal. There is mild thickening of the mitral valve leaflet(s). There is mild calcification of the mitral valve leaflet(s). Moderate mitral annular calcification. Mild mitral valve regurgitation. Tricuspid Valve: The tricuspid valve is normal in structure. Tricuspid valve regurgitation is trivial. Aortic Valve: The aortic valve is tricuspid. There is mild calcification of the aortic valve. There is mild thickening of the aortic valve. Aortic valve regurgitation is mild. Aortic valve sclerosis/calcification is present, without any evidence of aortic stenosis. Pulmonic Valve: The pulmonic valve was normal in structure. Pulmonic valve regurgitation is trivial. Aorta: Aortic dilatation noted. There is  borderline dilatation of the aortic root, measuring 38 mm. IAS/Shunts: The atrial septum is grossly normal.  LEFT VENTRICLE PLAX 2D LVIDd:         4.20 cm   Diastology LVIDs:         3.50 cm   LV e' medial:    5.33 cm/s LV PW:         1.00 cm   LV E/e' medial:  14.3 LV IVS:        1.50 cm   LV e' lateral:   6.05 cm/s LVOT diam:     2.20 cm   LV E/e' lateral: 12.6 LV SV:         65 LV SV Index:   33 LVOT Area:     3.80 cm  RIGHT VENTRICLE RV S prime:     12.70 cm/s TAPSE (M-mode): 1.7 cm LEFT ATRIUM             Index        RIGHT ATRIUM           Index LA diam:        4.20 cm 2.10 cm/m   RA Area:     16.10 cm LA Vol (A2C):   52.3 ml 26.17 ml/m  RA Volume:   32.70 ml  16.36 ml/m LA Vol (A4C):   77.2 ml 38.63 ml/m LA Biplane Vol: 65.2 ml 32.63 ml/m  AORTIC VALVE LVOT Vmax:   82.90 cm/s LVOT Vmean:  49.200 cm/s LVOT VTI:    0.172 m  AORTA Ao Root diam: 3.80 cm MITRAL VALVE MV Area (PHT): 2.69 cm     SHUNTS MV Decel Time: 282 msec     Systemic VTI:  0.17 m MV E velocity: 76.30 cm/s   Systemic Diam: 2.20 cm MV A velocity: 111.00 cm/s MV E/A ratio:  0.69 Gwyndolyn Kaufman MD Electronically signed by Gwyndolyn Kaufman MD Signature Date/Time: 02/12/2022/5:11:04 PM    Final      Scheduled Meds:  aspirin EC  81 mg Oral Daily   rosuvastatin  40 mg Oral Daily   ticagrelor  60 mg Oral BID   Continuous Infusions:   LOS: 0 days    Roxan Hockey M.D on 02/12/2022 at 5:23 PM  Go to [email or web address] - for contact info  [facility] - Office  [phone]  If 7PM-7AM, please contact night-coverage [email or web address] Password [facility] 02/12/2022, 5:23 PM

## 2022-02-12 NOTE — Progress Notes (Signed)
Patient alert and verbal. Reports no complaints of pain/discomfort. Ambulated in the hallway with therapy this shift. Patient family requested to speak with MD regarding patient care. MD Courage made aware. MD Courage spoke with family and patient with this Probation officer at bedside.  ?

## 2022-02-12 NOTE — Progress Notes (Signed)
?  Echocardiogram ?2D Echocardiogram has been performed. ? Hassie Bruce ?02/12/2022, 2:28 PM ?

## 2022-02-13 ENCOUNTER — Observation Stay (HOSPITAL_COMMUNITY): Payer: Medicare Other

## 2022-02-13 ENCOUNTER — Encounter (HOSPITAL_COMMUNITY): Payer: Self-pay | Admitting: Internal Medicine

## 2022-02-13 DIAGNOSIS — R600 Localized edema: Secondary | ICD-10-CM | POA: Diagnosis not present

## 2022-02-13 DIAGNOSIS — I714 Abdominal aortic aneurysm, without rupture, unspecified: Secondary | ICD-10-CM | POA: Diagnosis not present

## 2022-02-13 NOTE — Progress Notes (Signed)
PROGRESS NOTE     Peter [name], is a 81 y.o. male, DOB - Sep 06, 1941, NWG:[id number]  Admit date - 02/11/2022   Admitting Physician Bernadette Hoit, DO  Outpatient Primary MD for the patient is Luking, Elayne Snare, MD  LOS - 0  No chief complaint on file.       Brief Narrative:   81 y.o. male with medical history significant of hypertension, hyperlipidemia, CAD s/p CABG (1997), tinnitus, T2DM, CKD 3A admitted on 02/11/2022 with acute strokes in multiple vascular territories raising concerns for possible embolic phenomena    -Assessment and Plan:   1)Acute CVA (cerebrovascular accident) ([place]) -- MRI brain showed Multiple punctate supratentorial acute infarcts bilaterally, Given involvement of multiple vascular territories, consider an embolic etiology -Continue aspirin, Brilinta and Crestor -Patient may need DOAC if any evidence of DVT with PFO or, Intracardiac thrombus or A-fib or severely low EF on echo -Discussed with neurologist Dr. Chelsea Primus TEE -If TEE is negative patient will need Zio patch/heart monitor to rule out A-fib ---CTA head and neck with no LVO however patient has  80% bilateral proximal ICA stenoses. Prominent soft plaque in both carotid bulbs with plaque ulceration and Severe left V4 stenosis----??? if candidate for carotid endarterectomy --LDL is 52, HDL is 39, A1c pending -Echo with EF of 50-55 %, possible inferior wall hypokinesis and grade 1 diastolic dysfunction--- compared to prior echo from 2021 EF is improved from 40 to 45% to 50-55, and inferior wall motion abnormalities -Physical therapy evaluation appreciated--recommend outpatient PT -Awaiting official cardiology consult for possible TEE on 02/14/2022 -Awaiting official neurology consult on 02/14/2022  2)PAD----patient has abdominal aortic aneurysm greater than 5 cm, currently being investigated for possible intervention -Currently on aspirin, Brilinta and Crestor  3)CAD with history of CABG in  1997 with subsequent PCI 2003 and 2008, most recent cardiac catheterization demonstrating 90% stenosis just beyond the graft insertion site of the LIMA to LAD. This was intervened with drug-eluting stent in September of 2015 -c/n aspirin, Brilinta and Crestor -Metoprolol on hold due to acute stroke to allow for blood pressure  4)Chronic kidney disease, stage 3a (HCC) BUN/creatinine 22/1.51 (baseline creatinine at 1.3-1.7 Renally adjust medications, avoid nephrotoxic agents/dehydration/hypotension  5)Chronic idiopathic thrombocytopenia (HCC) Platelets 120.  No sign of any bleeding or vomiting of blood or blood in stool  6)Type 2 diabetes mellitus (HCC) A1c typically for patient is less than 7 reflecting excellent diabetic control PTA - A1c pending Patient was not on any antidiabetic medication Use Novolog/Humalog Sliding scale insulin with Accu-Cheks/Fingersticks as ordered   7)HTN--allow permissive hypertension due to acute stroke so hold losartan and metoprolol  Disposition/Need for in-Hospital Stay- patient unable to be discharged at this time due to --acute strokes requiring TEE and monitoring on telemetry unit*  Disposition: The patient is from: Home              Anticipated d/c is to: Home              Anticipated d/c date is: 2 days              Patient currently is not medically stable to d/c. Barriers: Not Clinically Stable-   Code Status :  -  Code Status: Full Code   Family Communication:    (patient is alert, awake and coherent) Discussed with pts son Antony Haste at bedside  DVT Prophylaxis  :   - SCDs  SCDs Start: 02/11/22 2237   Lab Results  Component Value Date   PLT 124 (  L) 02/12/2022    Inpatient Medications  Scheduled Meds:  aspirin EC  81 mg Oral Daily   insulin aspart  0-6 Units Subcutaneous TID WC   rosuvastatin  40 mg Oral Daily   ticagrelor  60 mg Oral BID   Continuous Infusions: PRN Meds:.   Anti-infectives (From admission, onward)    None          Subjective: Peter [name] today has no fevers, no emesis,  No chest pain,   -No new concerns, -Dizziness and tinnitus resolved -Gait improving  Objective: Vitals:   02/12/22 1317 02/12/22 2230 02/13/22 0518 02/13/22 0937  BP: (!) 142/64 (!) 147/78 124/62 (!) 144/68  Pulse: 71 75 70 65  Resp: '20 19 19 18  '$ Temp: 98.7 F (37.1 C) 98 F (36.7 C) 97.7 F (36.5 C) 98 F (36.7 C)  TempSrc: Oral Oral Oral Axillary  SpO2: 100% 99% 97% 98%  Weight:      Height:        Intake/Output Summary (Last 24 hours) at 02/13/2022 1444 Last data filed at 02/13/2022 4235 Gross per 24 hour  Intake 240 ml  Output 350 ml  Net -110 ml   Filed Weights   02/11/22 1536  Weight: 78 kg    Physical Exam  Gen:- Awake Alert,  in no apparent distress  HEENT:- [place].AT, No sclera icterus Neck-Supple Neck,No JVD,.  Lungs-  CTAB , fair symmetrical air movement CV- S1, S2 normal, regular  Abd-  +ve B.Sounds, Abd Soft, No tenderness,    Extremity/Skin:- No  edema, pedal pulses present  Psych-affect is appropriate, oriented x3 Neuro-dizziness and tinnitus resolving, no new focal deficits, no tremors  Data Reviewed: I have personally reviewed following labs and imaging studies  CBC: Recent Labs  Lab 02/11/22 1748 02/12/22 0506  WBC 8.2 8.8  NEUTROABS 4.3  --   HGB 13.9 13.1  HCT 41.4 39.9  MCV 97.2 96.1  PLT 120* 361*   Basic Metabolic Panel: Recent Labs  Lab 02/11/22 1748 02/12/22 0506 02/12/22 2322  NA 139 137  --   K 4.2 4.4  --   CL 104 106  --   CO2 25 24  --   GLUCOSE 102* 148* 383*  BUN 22 23  --   CREATININE 1.51* 1.46*  --   CALCIUM 9.3 8.8*  --   MG  --  2.1  --   PHOS  --  2.8  --    GFR: Estimated Creatinine Clearance: 45 mL/min (A) (by C-G formula based on SCr of 1.46 mg/dL (H)). Liver Function Tests: Recent Labs  Lab 02/11/22 1748 02/12/22 0506  AST 30 24  ALT 21 19  ALKPHOS 74 69  BILITOT 1.0 0.6  PROT 8.0 7.2  ALBUMIN 4.3 3.7   Cardiac Enzymes: No  results for input(s): CKTOTAL, CKMB, CKMBINDEX, TROPONINI in the last 168 hours. BNP (last 3 results) No results for input(s): PROBNP in the last 8760 hours. HbA1C: No results for input(s): HGBA1C in the last 72 hours. Sepsis Labs: '@LABRCNTIP'$ (procalcitonin:4,lacticidven:4) ) Recent Results (from the past 240 hour(s))  Resp Panel by RT-PCR (Flu A&B, Covid) Nasopharyngeal Swab     Status: None   Collection Time: 02/11/22  6:06 PM   Specimen: Nasopharyngeal Swab; Nasopharyngeal(NP) swabs in vial transport medium  Result Value Ref Range Status   SARS Coronavirus 2 by RT PCR NEGATIVE NEGATIVE Final    Comment: (NOTE) SARS-CoV-2 target nucleic acids are NOT DETECTED.  The SARS-CoV-2 RNA is generally detectable  in upper respiratory specimens during the acute phase of infection. The lowest concentration of SARS-CoV-2 viral copies this assay can detect is 138 copies/mL. A negative result does not preclude SARS-Cov-2 infection and should not be used as the sole basis for treatment or other patient management decisions. A negative result may occur with  improper specimen collection/handling, submission of specimen other than nasopharyngeal swab, presence of viral mutation(s) within the areas targeted by this assay, and inadequate number of viral copies(<138 copies/mL). A negative result must be combined with clinical observations, patient history, and epidemiological information. The expected result is Negative.  Fact Sheet for Patients:  EntrepreneurPulse.com.au  Fact Sheet for Healthcare Providers:  IncredibleEmployment.be  This test is no t yet approved or cleared by the [place] FDA and  has been authorized for detection and/or diagnosis of SARS-CoV-2 by FDA under an Emergency Use Authorization (EUA). This EUA will remain  in effect (meaning this test can be used) for the duration of the COVID-19 declaration under Section 564(b)(1) of the Act,  21 U.S.C.section 360bbb-3(b)(1), unless the authorization is terminated  or revoked sooner.       Influenza A by PCR NEGATIVE NEGATIVE Final   Influenza B by PCR NEGATIVE NEGATIVE Final    Comment: (NOTE) The Xpert Xpress SARS-CoV-2/FLU/RSV plus assay is intended as an aid in the diagnosis of influenza from Nasopharyngeal swab specimens and should not be used as a sole basis for treatment. Nasal washings and aspirates are unacceptable for Xpert Xpress SARS-CoV-2/FLU/RSV testing.  Fact Sheet for Patients: EntrepreneurPulse.com.au  Fact Sheet for Healthcare Providers: IncredibleEmployment.be  This test is not yet approved or cleared by the [place] FDA and has been authorized for detection and/or diagnosis of SARS-CoV-2 by FDA under an Emergency Use Authorization (EUA). This EUA will remain in effect (meaning this test can be used) for the duration of the COVID-19 declaration under Section 564(b)(1) of the Act, 21 U.S.C. section 360bbb-3(b)(1), unless the authorization is terminated or revoked.  Performed at [facility], [address], [place], [place] [address]       Radiology Studies: CT ANGIO HEAD NECK W WO CM  Result Date: 02/12/2022 CLINICAL DATA:  Neuro deficit, acute, stroke suspected. Punctate acute infarcts on head MRI yesterday. EXAM: CT ANGIOGRAPHY HEAD AND NECK TECHNIQUE: Multidetector CT imaging of the head and neck was performed using the standard protocol during bolus administration of intravenous contrast. Multiplanar CT image reconstructions and MIPs were obtained to evaluate the vascular anatomy. Carotid stenosis measurements (when applicable) are obtained utilizing NASCET criteria, using the distal internal carotid diameter as the denominator. RADIATION DOSE REDUCTION: This exam was performed according to the departmental dose-optimization program which includes automated exposure control, adjustment of the mA and/or kV  according to patient size and/or use of iterative reconstruction technique. CONTRAST:  47m OMNIPAQUE IOHEXOL 350 MG/ML SOLN COMPARISON:  Head MRI 02/11/2022.  Head and neck CTA 04/12/2018. FINDINGS: CT HEAD FINDINGS Brain: No acute large territory infarct, intracranial hemorrhage, mass, midline shift, or extra-axial fluid collection is identified. The punctate infarcts on yesterday's MRI are not well demonstrated by CT. There is mild cerebral atrophy. Hypodensities in the cerebral white matter bilaterally are nonspecific but compatible with mild chronic small vessel ischemic disease. Chronic lacunar infarcts are noted in the basal ganglia. A small chronic left cerebellar infarct is also again noted. Vascular: Calcified atherosclerosis at the skull base. Skull: No fracture or suspicious osseous lesion. Sinuses: Paranasal sinuses and mastoid air cells are clear. Orbits: Right cataract  extraction. Review of the MIP images confirms the above findings CTA NECK FINDINGS Aortic arch: Standard 3 vessel aortic arch with a moderate amount of calcified and soft plaque in the arch and included distal descending thoracic aorta. Atherosclerotic irregularity of the brachiocephalic and subclavian arteries without a flow limiting stenosis. Right carotid system: Patent with extensive calcified and soft plaque in the carotid bulb resulting in 75-80% proximal ICA stenosis, greatly progressed from the prior CTA. 4 mm focus of plaque ulceration posteriorly in the bulb. Mild atherosclerotic irregularity of the distal cervical ICA without significant stenosis. Left carotid system: Patent with extensive calcified and soft plaque in the carotid bulb resulting in a new 80% stenosis at the distal aspect of the bulb. Mild plaque ulceration. Vertebral arteries: Patent without evidence of a dissection or significant stenosis on the left. New mild stenosis of the right vertebral artery near the V3-V4 junction. Skeleton: Cervical disc degeneration  greatest at C5-6. Other neck: No evidence of cervical lymphadenopathy or mass. Upper chest: Mild centrilobular and paraseptal emphysema. No apical lung consolidation or mass. Review of the MIP images confirms the above findings CTA HEAD FINDINGS Anterior circulation: The internal carotid arteries are patent from skull base to carotid termini with atherosclerotic plaque resulting in progressive, moderate bilateral supraclinoid stenoses. ACAs and MCAs are patent without evidence of a proximal branch occlusion. There are new bilateral M1 stenoses. No aneurysm is identified. Posterior circulation: The intracranial vertebral arteries are patent to the basilar with progressive atherosclerosis resulting in up to mild right and severe left V4 segment stenoses. The basilar artery is patent with atherosclerotic irregularity but no significant stenosis. Posterior communicating arteries are diminutive or absent. Both PCAs are patent, and there is a new moderate mid right P2 stenosis. There is mild narrowing of the left P2 segment which is less prominent than on the prior CTA. No aneurysm is identified. Venous sinuses: Absent opacification of the left sigmoid sinus which is attributed to arterial dominant contrast timing and slower flow in this nondominant sinus rather than occlusion given normal opacification on yesterday's MRI. Anatomic variants: None. Review of the MIP images confirms the above findings IMPRESSION: 1. Progressive atherosclerosis in the head and neck. No large vessel occlusion. 2. 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 Prominent soft plaque in both carotid bulbs with plaque ulceration. 3. Moderate bilateral intracranial ICA stenoses. 4. Severe left V4 stenosis. 5. Aortic Atherosclerosis (ICD10-I70.0) and Emphysema (ICD10-J43.9). Electronically Signed   By: Logan Bores M.D.   On: 02/12/2022 13:04   MR Brain W Wo Contrast  Result Date: 02/11/2022 CLINICAL DATA:  pulsitile tinnitus EXAM: MRI HEAD WITHOUT AND  WITH CONTRAST TECHNIQUE: Multiplanar, multiecho pulse sequences of the brain and surrounding structures were obtained without and with intravenous contrast. CONTRAST:  44m GADAVIST GADOBUTROL 1 MMOL/ML IV SOLN COMPARISON:  None. FINDINGS: Brain: Multiple punctate acute infarcts bilaterally, including punctate infarcts in bilateral frontal lobes (series 5, images 31, 35, and 38), right basal ganglia (series 5, image 25), right periventricular parieto-occipital region (series 5, image 19). No hydrocephalus, mass lesion, midline shift, or extra-axial fluid collection. Cerebral atrophy with ex vacuo ventricular dilation. Mild to moderate scattered T2/FLAIR hyperintensities in the white matter, nonspecific but compatible with chronic microvascular ischemic disease. Remote inferior left cerebellar infarct. No abnormal enhancement. Dedicated IAC protocol was performed. The visualized seventh and eighth cranial nerves are unremarkable bilaterally. Normal T2 hyperintense signal within the labyrinth bilaterally. No abnormal enhancement or evidence of a retrocochlear mass. Small right mastoid effusion. Vascular: Abnormal  signal within the left intradural vertebral artery on T2 imaging with apparent filling defect versus atherosclerotic plaque on postcontrast imaging. otherwise, major arterial flow voids are maintained at the skull base. Skull and upper cervical spine: Normal marrow signal. Sinuses/Orbits: Negative. Other: No mastoid effusions. IMPRESSION: 1. Multiple punctate supratentorial acute infarcts bilaterally, detailed above. Given involvement of multiple vascular territories, consider an embolic etiology. No significant mass effect. 2. Abnormal signal within the left intradural vertebral artery on T2 imaging with apparent filling defect versus atherosclerotic plaque on postcontrast imaging. Recommend CTA or MRA to further evaluate. 3. No retrocochlear mass. 4. Cerebral atrophy, chronic microvascular ischemic disease  and remote inferior left cerebellar infarct. These results will be called to the ordering clinician or representative by the Radiologist Assistant, and communication documented in the PACS or [employer]. Electronically Signed   By: Margaretha Sheffield M.D.   On: 02/11/2022 17:07   US Venous Img Lower Bilateral (DVT)  Result Date: 02/13/2022 CLINICAL DATA:  Bilateral lower extremity edema. History of right great saphenous vein harvest. EXAM: BILATERAL LOWER EXTREMITY VENOUS DOPPLER ULTRASOUND TECHNIQUE: Gray-scale sonography with graded compression, as well as color Doppler and duplex ultrasound were performed to evaluate the lower extremity deep venous systems from the level of the common femoral vein and including the common femoral, femoral, profunda femoral, popliteal and calf veins including the posterior tibial, peroneal and gastrocnemius veins when visible. The superficial great saphenous vein was also interrogated. Spectral Doppler was utilized to evaluate flow at rest and with distal augmentation maneuvers in the common femoral, femoral and popliteal veins. COMPARISON:  None. FINDINGS: RIGHT LOWER EXTREMITY Common Femoral Vein: No evidence of thrombus. Normal compressibility, respiratory phasicity and response to augmentation. Saphenofemoral Junction: No evidence of thrombus. Normal compressibility and flow on color Doppler imaging. Profunda Femoral Vein: No evidence of thrombus. Normal compressibility and flow on color Doppler imaging. Femoral Vein: No evidence of thrombus. Normal compressibility, respiratory phasicity and response to augmentation. Popliteal Vein: No evidence of thrombus. Normal compressibility, respiratory phasicity and response to augmentation. Calf Veins: No evidence of thrombus. Normal compressibility and flow on color Doppler imaging. Other Findings:  None. LEFT LOWER EXTREMITY Common Femoral Vein: No evidence of thrombus. Normal compressibility, respiratory phasicity and  response to augmentation. Saphenofemoral Junction: No evidence of thrombus. Normal compressibility and flow on color Doppler imaging. Profunda Femoral Vein: No evidence of thrombus. Normal compressibility and flow on color Doppler imaging. Femoral Vein: No evidence of thrombus. Normal compressibility, respiratory phasicity and response to augmentation. Popliteal Vein: No evidence of thrombus. Normal compressibility, respiratory phasicity and response to augmentation. Calf Veins: No evidence of thrombus. Normal compressibility and flow on color Doppler imaging. Other Findings:  None. IMPRESSION: No evidence of deep venous thrombosis in either lower extremity. Electronically Signed   By: Markus Daft M.D.   On: 02/13/2022 11:31   ECHOCARDIOGRAM COMPLETE  Result Date: 02/12/2022    ECHOCARDIOGRAM REPORT   Patient Name:   AYRTON MCVAY Date of Exam: 02/12/2022 Medical Rec #:  [id number]      Height:       72.0 in Accession #:    [id number]     Weight:       172.0 lb Date of Birth:  [birth year]      BSA:          1.998 m Patient Age:    39 years       BP:           142/64 mmHg Patient  Gender: M              HR:           71 bpm. Exam Location:  Inpatient Procedure: 2D Echo, Cardiac Doppler and Color Doppler Indications:    Stroke I63.9  History:        Patient has prior history of Echocardiogram examinations, most                 recent 02/25/2020. CAD and Previous Myocardial Infarction, COPD;                 Risk Factors:Hypertension, Diabetes, Dyslipidemia and Former                 Smoker. AAA.  Sonographer:    Darlina Sicilian RDCS Referring Phys: [id number] OLADAPO ADEFESO IMPRESSIONS  1. Left ventricular ejection fraction, by estimation, is 50 to 55%. The left ventricle has low normal function. Wall motion difficult to assess due to poor visualization of the LV endocardium, however, the inferior wall appears hypokinetic based on limited views. There is mild concentric left ventricular hypertrophy. Left ventricular  diastolic parameters are consistent with Grade I diastolic dysfunction (impaired relaxation).  2. Right ventricular systolic function is normal. The right ventricular size is normal.  3. Left atrial size was moderately dilated.  4. The mitral valve is abnormal. Mild mitral valve regurgitation. Moderate mitral annular calcification.  5. The aortic valve is tricuspid. There is mild calcification of the aortic valve. There is mild thickening of the aortic valve. Aortic valve regurgitation is mild. Aortic valve sclerosis/calcification is present, without any evidence of aortic stenosis.  6. Aortic dilatation noted. There is borderline dilatation of the aortic root, measuring 38 mm. Comparison(s): Compared to prior TTE in 02/2020, the LVEF has improved slightly to 50-55% (previously 40-45%). The inferior WMA persist. Conclusion(s)/Recommendation(s): No intracardiac source of embolism detected on this transthoracic study. Consider a transesophageal echocardiogram to exclude cardiac source of embolism if clinically indicated. FINDINGS  Left Ventricle: Left ventricular ejection fraction, by estimation, is 50 to 55%. The left ventricle has low normal function. Difficult to assess wall motion due to incomplete visualization of LV endocardium, however, the inferior wall appears hypokinetic. The left ventricular internal cavity size was normal in size. There is mild concentric left ventricular hypertrophy. Left ventricular diastolic parameters are consistent with Grade I diastolic dysfunction (impaired relaxation). Right Ventricle: The right ventricular size is normal. Right vetricular wall thickness was not well visualized. Right ventricular systolic function is normal. Left Atrium: Left atrial size was moderately dilated. Right Atrium: Right atrial size was normal in size. Pericardium: There is no evidence of pericardial effusion. Mitral Valve: The mitral valve is abnormal. There is mild thickening of the mitral valve  leaflet(s). There is mild calcification of the mitral valve leaflet(s). Moderate mitral annular calcification. Mild mitral valve regurgitation. Tricuspid Valve: The tricuspid valve is normal in structure. Tricuspid valve regurgitation is trivial. Aortic Valve: The aortic valve is tricuspid. There is mild calcification of the aortic valve. There is mild thickening of the aortic valve. Aortic valve regurgitation is mild. Aortic valve sclerosis/calcification is present, without any evidence of aortic stenosis. Pulmonic Valve: The pulmonic valve was normal in structure. Pulmonic valve regurgitation is trivial. Aorta: Aortic dilatation noted. There is borderline dilatation of the aortic root, measuring 38 mm. IAS/Shunts: The atrial septum is grossly normal.  LEFT VENTRICLE PLAX 2D LVIDd:         4.20 cm   Diastology LVIDs:  3.50 cm   LV e' medial:    5.33 cm/s LV PW:         1.00 cm   LV E/e' medial:  14.3 LV IVS:        1.50 cm   LV e' lateral:   6.05 cm/s LVOT diam:     2.20 cm   LV E/e' lateral: 12.6 LV SV:         65 LV SV Index:   33 LVOT Area:     3.80 cm  RIGHT VENTRICLE RV S prime:     12.70 cm/s TAPSE (M-mode): 1.7 cm LEFT ATRIUM             Index        RIGHT ATRIUM           Index LA diam:        4.20 cm 2.10 cm/m   RA Area:     16.10 cm LA Vol (A2C):   52.3 ml 26.17 ml/m  RA Volume:   32.70 ml  16.36 ml/m LA Vol (A4C):   77.2 ml 38.63 ml/m LA Biplane Vol: 65.2 ml 32.63 ml/m  AORTIC VALVE LVOT Vmax:   82.90 cm/s LVOT Vmean:  49.200 cm/s LVOT VTI:    0.172 m  AORTA Ao Root diam: 3.80 cm MITRAL VALVE MV Area (PHT): 2.69 cm     SHUNTS MV Decel Time: 282 msec     Systemic VTI:  0.17 m MV E velocity: 76.30 cm/s   Systemic Diam: 2.20 cm MV A velocity: 111.00 cm/s MV E/A ratio:  0.69 Gwyndolyn Kaufman MD Electronically signed by Gwyndolyn Kaufman MD Signature Date/Time: 02/12/2022/5:11:04 PM    Final      Scheduled Meds:  aspirin EC  81 mg Oral Daily   insulin aspart  0-6 Units Subcutaneous TID WC    rosuvastatin  40 mg Oral Daily   ticagrelor  60 mg Oral BID   Continuous Infusions:   LOS: 0 days    Roxan Hockey M.D on 02/13/2022 at 2:44 PM  Go to [email or web address] - for contact info  [facility] - Office  [phone]  If 7PM-7AM, please contact night-coverage [email or web address] Password [facility] 02/13/2022, 2:44 PM

## 2022-02-13 NOTE — H&P (View-Only) (Signed)
PROGRESS NOTE     Peter [name], is a 81 y.o. male, DOB - 1941/08/29, FKC:[id number]  Admit date - 02/11/2022   Admitting Physician Bernadette Hoit, DO  Outpatient Primary MD for the patient is Luking, Elayne Snare, MD  LOS - 0  No chief complaint on file.       Brief Narrative:   81 y.o. male with medical history significant of hypertension, hyperlipidemia, CAD s/p CABG (1997), tinnitus, T2DM, CKD 3A admitted on 02/11/2022 with acute strokes in multiple vascular territories raising concerns for possible embolic phenomena    -Assessment and Plan:   1)Acute CVA (cerebrovascular accident) ([place]) -- MRI brain showed Multiple punctate supratentorial acute infarcts bilaterally, Given involvement of multiple vascular territories, consider an embolic etiology -Continue aspirin, Brilinta and Crestor -Patient may need DOAC if any evidence of DVT with PFO or, Intracardiac thrombus or A-fib or severely low EF on echo -Discussed with neurologist Dr. Chelsea Primus TEE -If TEE is negative patient will need Zio patch/heart monitor to rule out A-fib ---CTA head and neck with no LVO however patient has  80% bilateral proximal ICA stenoses. Prominent soft plaque in both carotid bulbs with plaque ulceration and Severe left V4 stenosis----??? if candidate for carotid endarterectomy --LDL is 52, HDL is 39, A1c pending -Echo with EF of 50-55 %, possible inferior wall hypokinesis and grade 1 diastolic dysfunction--- compared to prior echo from 2021 EF is improved from 40 to 45% to 50-55, and inferior wall motion abnormalities -Physical therapy evaluation appreciated--recommend outpatient PT -Awaiting official cardiology consult for possible TEE on 02/14/2022 -Awaiting official neurology consult on 02/14/2022  2)PAD----patient has abdominal aortic aneurysm greater than 5 cm, currently being investigated for possible intervention -Currently on aspirin, Brilinta and Crestor  3)CAD with history of CABG in  1997 with subsequent PCI 2003 and 2008, most recent cardiac catheterization demonstrating 90% stenosis just beyond the graft insertion site of the LIMA to LAD. This was intervened with drug-eluting stent in September of 2015 -c/n aspirin, Brilinta and Crestor -Metoprolol on hold due to acute stroke to allow for blood pressure  4)Chronic kidney disease, stage 3a (HCC) BUN/creatinine 22/1.51 (baseline creatinine at 1.3-1.7 Renally adjust medications, avoid nephrotoxic agents/dehydration/hypotension  5)Chronic idiopathic thrombocytopenia (HCC) Platelets 120.  No sign of any bleeding or vomiting of blood or blood in stool  6)Type 2 diabetes mellitus (HCC) A1c typically for patient is less than 7 reflecting excellent diabetic control PTA - A1c pending Patient was not on any antidiabetic medication Use Novolog/Humalog Sliding scale insulin with Accu-Cheks/Fingersticks as ordered   7)HTN--allow permissive hypertension due to acute stroke so hold losartan and metoprolol  Disposition/Need for in-Hospital Stay- patient unable to be discharged at this time due to --acute strokes requiring TEE and monitoring on telemetry unit*  Disposition: The patient is from: Home              Anticipated d/c is to: Home              Anticipated d/c date is: 2 days              Patient currently is not medically stable to d/c. Barriers: Not Clinically Stable-   Code Status :  -  Code Status: Full Code   Family Communication:    (patient is alert, awake and coherent) Discussed with pts son Antony Haste at bedside  DVT Prophylaxis  :   - SCDs  SCDs Start: 02/11/22 2237   Lab Results  Component Value Date   PLT 124 (  L) 02/12/2022    Inpatient Medications  Scheduled Meds:  aspirin EC  81 mg Oral Daily   insulin aspart  0-6 Units Subcutaneous TID WC   rosuvastatin  40 mg Oral Daily   ticagrelor  60 mg Oral BID   Continuous Infusions: PRN Meds:.   Anti-infectives (From admission, onward)    None          Subjective: Peter [name] today has no fevers, no emesis,  No chest pain,   -No new concerns, -Dizziness and tinnitus resolved -Gait improving  Objective: Vitals:   02/12/22 1317 02/12/22 2230 02/13/22 0518 02/13/22 0937  BP: (!) 142/64 (!) 147/78 124/62 (!) 144/68  Pulse: 71 75 70 65  Resp: '20 19 19 18  '$ Temp: 98.7 F (37.1 C) 98 F (36.7 C) 97.7 F (36.5 C) 98 F (36.7 C)  TempSrc: Oral Oral Oral Axillary  SpO2: 100% 99% 97% 98%  Weight:      Height:        Intake/Output Summary (Last 24 hours) at 02/13/2022 1444 Last data filed at 02/13/2022 1610 Gross per 24 hour  Intake 240 ml  Output 350 ml  Net -110 ml   Filed Weights   02/11/22 1536  Weight: 78 kg    Physical Exam  Gen:- Awake Alert,  in no apparent distress  HEENT:- [place].AT, No sclera icterus Neck-Supple Neck,No JVD,.  Lungs-  CTAB , fair symmetrical air movement CV- S1, S2 normal, regular  Abd-  +ve B.Sounds, Abd Soft, No tenderness,    Extremity/Skin:- No  edema, pedal pulses present  Psych-affect is appropriate, oriented x3 Neuro-dizziness and tinnitus resolving, no new focal deficits, no tremors  Data Reviewed: I have personally reviewed following labs and imaging studies  CBC: Recent Labs  Lab 02/11/22 1748 02/12/22 0506  WBC 8.2 8.8  NEUTROABS 4.3  --   HGB 13.9 13.1  HCT 41.4 39.9  MCV 97.2 96.1  PLT 120* 960*   Basic Metabolic Panel: Recent Labs  Lab 02/11/22 1748 02/12/22 0506 02/12/22 2322  NA 139 137  --   K 4.2 4.4  --   CL 104 106  --   CO2 25 24  --   GLUCOSE 102* 148* 383*  BUN 22 23  --   CREATININE 1.51* 1.46*  --   CALCIUM 9.3 8.8*  --   MG  --  2.1  --   PHOS  --  2.8  --    GFR: Estimated Creatinine Clearance: 45 mL/min (A) (by C-G formula based on SCr of 1.46 mg/dL (H)). Liver Function Tests: Recent Labs  Lab 02/11/22 1748 02/12/22 0506  AST 30 24  ALT 21 19  ALKPHOS 74 69  BILITOT 1.0 0.6  PROT 8.0 7.2  ALBUMIN 4.3 3.7   Cardiac Enzymes: No  results for input(s): CKTOTAL, CKMB, CKMBINDEX, TROPONINI in the last 168 hours. BNP (last 3 results) No results for input(s): PROBNP in the last 8760 hours. HbA1C: No results for input(s): HGBA1C in the last 72 hours. Sepsis Labs: '@LABRCNTIP'$ (procalcitonin:4,lacticidven:4) ) Recent Results (from the past 240 hour(s))  Resp Panel by RT-PCR (Flu A&B, Covid) Nasopharyngeal Swab     Status: None   Collection Time: 02/11/22  6:06 PM   Specimen: Nasopharyngeal Swab; Nasopharyngeal(NP) swabs in vial transport medium  Result Value Ref Range Status   SARS Coronavirus 2 by RT PCR NEGATIVE NEGATIVE Final    Comment: (NOTE) SARS-CoV-2 target nucleic acids are NOT DETECTED.  The SARS-CoV-2 RNA is generally detectable  in upper respiratory specimens during the acute phase of infection. The lowest concentration of SARS-CoV-2 viral copies this assay can detect is 138 copies/mL. A negative result does not preclude SARS-Cov-2 infection and should not be used as the sole basis for treatment or other patient management decisions. A negative result may occur with  improper specimen collection/handling, submission of specimen other than nasopharyngeal swab, presence of viral mutation(s) within the areas targeted by this assay, and inadequate number of viral copies(<138 copies/mL). A negative result must be combined with clinical observations, patient history, and epidemiological information. The expected result is Negative.  Fact Sheet for Patients:  EntrepreneurPulse.com.au  Fact Sheet for Healthcare Providers:  IncredibleEmployment.be  This test is no t yet approved or cleared by the [place] FDA and  has been authorized for detection and/or diagnosis of SARS-CoV-2 by FDA under an Emergency Use Authorization (EUA). This EUA will remain  in effect (meaning this test can be used) for the duration of the COVID-19 declaration under Section 564(b)(1) of the Act,  21 U.S.C.section 360bbb-3(b)(1), unless the authorization is terminated  or revoked sooner.       Influenza A by PCR NEGATIVE NEGATIVE Final   Influenza B by PCR NEGATIVE NEGATIVE Final    Comment: (NOTE) The Xpert Xpress SARS-CoV-2/FLU/RSV plus assay is intended as an aid in the diagnosis of influenza from Nasopharyngeal swab specimens and should not be used as a sole basis for treatment. Nasal washings and aspirates are unacceptable for Xpert Xpress SARS-CoV-2/FLU/RSV testing.  Fact Sheet for Patients: EntrepreneurPulse.com.au  Fact Sheet for Healthcare Providers: IncredibleEmployment.be  This test is not yet approved or cleared by the [place] FDA and has been authorized for detection and/or diagnosis of SARS-CoV-2 by FDA under an Emergency Use Authorization (EUA). This EUA will remain in effect (meaning this test can be used) for the duration of the COVID-19 declaration under Section 564(b)(1) of the Act, 21 U.S.C. section 360bbb-3(b)(1), unless the authorization is terminated or revoked.  Performed at [facility], [address], [place], [place] [address]       Radiology Studies: CT ANGIO HEAD NECK W WO CM  Result Date: 02/12/2022 CLINICAL DATA:  Neuro deficit, acute, stroke suspected. Punctate acute infarcts on head MRI yesterday. EXAM: CT ANGIOGRAPHY HEAD AND NECK TECHNIQUE: Multidetector CT imaging of the head and neck was performed using the standard protocol during bolus administration of intravenous contrast. Multiplanar CT image reconstructions and MIPs were obtained to evaluate the vascular anatomy. Carotid stenosis measurements (when applicable) are obtained utilizing NASCET criteria, using the distal internal carotid diameter as the denominator. RADIATION DOSE REDUCTION: This exam was performed according to the departmental dose-optimization program which includes automated exposure control, adjustment of the mA and/or kV  according to patient size and/or use of iterative reconstruction technique. CONTRAST:  51m OMNIPAQUE IOHEXOL 350 MG/ML SOLN COMPARISON:  Head MRI 02/11/2022.  Head and neck CTA 04/12/2018. FINDINGS: CT HEAD FINDINGS Brain: No acute large territory infarct, intracranial hemorrhage, mass, midline shift, or extra-axial fluid collection is identified. The punctate infarcts on yesterday's MRI are not well demonstrated by CT. There is mild cerebral atrophy. Hypodensities in the cerebral white matter bilaterally are nonspecific but compatible with mild chronic small vessel ischemic disease. Chronic lacunar infarcts are noted in the basal ganglia. A small chronic left cerebellar infarct is also again noted. Vascular: Calcified atherosclerosis at the skull base. Skull: No fracture or suspicious osseous lesion. Sinuses: Paranasal sinuses and mastoid air cells are clear. Orbits: Right cataract  extraction. Review of the MIP images confirms the above findings CTA NECK FINDINGS Aortic arch: Standard 3 vessel aortic arch with a moderate amount of calcified and soft plaque in the arch and included distal descending thoracic aorta. Atherosclerotic irregularity of the brachiocephalic and subclavian arteries without a flow limiting stenosis. Right carotid system: Patent with extensive calcified and soft plaque in the carotid bulb resulting in 75-80% proximal ICA stenosis, greatly progressed from the prior CTA. 4 mm focus of plaque ulceration posteriorly in the bulb. Mild atherosclerotic irregularity of the distal cervical ICA without significant stenosis. Left carotid system: Patent with extensive calcified and soft plaque in the carotid bulb resulting in a new 80% stenosis at the distal aspect of the bulb. Mild plaque ulceration. Vertebral arteries: Patent without evidence of a dissection or significant stenosis on the left. New mild stenosis of the right vertebral artery near the V3-V4 junction. Skeleton: Cervical disc degeneration  greatest at C5-6. Other neck: No evidence of cervical lymphadenopathy or mass. Upper chest: Mild centrilobular and paraseptal emphysema. No apical lung consolidation or mass. Review of the MIP images confirms the above findings CTA HEAD FINDINGS Anterior circulation: The internal carotid arteries are patent from skull base to carotid termini with atherosclerotic plaque resulting in progressive, moderate bilateral supraclinoid stenoses. ACAs and MCAs are patent without evidence of a proximal branch occlusion. There are new bilateral M1 stenoses. No aneurysm is identified. Posterior circulation: The intracranial vertebral arteries are patent to the basilar with progressive atherosclerosis resulting in up to mild right and severe left V4 segment stenoses. The basilar artery is patent with atherosclerotic irregularity but no significant stenosis. Posterior communicating arteries are diminutive or absent. Both PCAs are patent, and there is a new moderate mid right P2 stenosis. There is mild narrowing of the left P2 segment which is less prominent than on the prior CTA. No aneurysm is identified. Venous sinuses: Absent opacification of the left sigmoid sinus which is attributed to arterial dominant contrast timing and slower flow in this nondominant sinus rather than occlusion given normal opacification on yesterday's MRI. Anatomic variants: None. Review of the MIP images confirms the above findings IMPRESSION: 1. Progressive atherosclerosis in the head and neck. No large vessel occlusion. 2. 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 Prominent soft plaque in both carotid bulbs with plaque ulceration. 3. Moderate bilateral intracranial ICA stenoses. 4. Severe left V4 stenosis. 5. Aortic Atherosclerosis (ICD10-I70.0) and Emphysema (ICD10-J43.9). Electronically Signed   By: Logan Bores M.D.   On: 02/12/2022 13:04   MR Brain W Wo Contrast  Result Date: 02/11/2022 CLINICAL DATA:  pulsitile tinnitus EXAM: MRI HEAD WITHOUT AND  WITH CONTRAST TECHNIQUE: Multiplanar, multiecho pulse sequences of the brain and surrounding structures were obtained without and with intravenous contrast. CONTRAST:  29m GADAVIST GADOBUTROL 1 MMOL/ML IV SOLN COMPARISON:  None. FINDINGS: Brain: Multiple punctate acute infarcts bilaterally, including punctate infarcts in bilateral frontal lobes (series 5, images 31, 35, and 38), right basal ganglia (series 5, image 25), right periventricular parieto-occipital region (series 5, image 19). No hydrocephalus, mass lesion, midline shift, or extra-axial fluid collection. Cerebral atrophy with ex vacuo ventricular dilation. Mild to moderate scattered T2/FLAIR hyperintensities in the white matter, nonspecific but compatible with chronic microvascular ischemic disease. Remote inferior left cerebellar infarct. No abnormal enhancement. Dedicated IAC protocol was performed. The visualized seventh and eighth cranial nerves are unremarkable bilaterally. Normal T2 hyperintense signal within the labyrinth bilaterally. No abnormal enhancement or evidence of a retrocochlear mass. Small right mastoid effusion. Vascular: Abnormal  signal within the left intradural vertebral artery on T2 imaging with apparent filling defect versus atherosclerotic plaque on postcontrast imaging. otherwise, major arterial flow voids are maintained at the skull base. Skull and upper cervical spine: Normal marrow signal. Sinuses/Orbits: Negative. Other: No mastoid effusions. IMPRESSION: 1. Multiple punctate supratentorial acute infarcts bilaterally, detailed above. Given involvement of multiple vascular territories, consider an embolic etiology. No significant mass effect. 2. Abnormal signal within the left intradural vertebral artery on T2 imaging with apparent filling defect versus atherosclerotic plaque on postcontrast imaging. Recommend CTA or MRA to further evaluate. 3. No retrocochlear mass. 4. Cerebral atrophy, chronic microvascular ischemic disease  and remote inferior left cerebellar infarct. These results will be called to the ordering clinician or representative by the Radiologist Assistant, and communication documented in the PACS or [employer]. Electronically Signed   By: Margaretha Sheffield M.D.   On: 02/11/2022 17:07   US Venous Img Lower Bilateral (DVT)  Result Date: 02/13/2022 CLINICAL DATA:  Bilateral lower extremity edema. History of right great saphenous vein harvest. EXAM: BILATERAL LOWER EXTREMITY VENOUS DOPPLER ULTRASOUND TECHNIQUE: Gray-scale sonography with graded compression, as well as color Doppler and duplex ultrasound were performed to evaluate the lower extremity deep venous systems from the level of the common femoral vein and including the common femoral, femoral, profunda femoral, popliteal and calf veins including the posterior tibial, peroneal and gastrocnemius veins when visible. The superficial great saphenous vein was also interrogated. Spectral Doppler was utilized to evaluate flow at rest and with distal augmentation maneuvers in the common femoral, femoral and popliteal veins. COMPARISON:  None. FINDINGS: RIGHT LOWER EXTREMITY Common Femoral Vein: No evidence of thrombus. Normal compressibility, respiratory phasicity and response to augmentation. Saphenofemoral Junction: No evidence of thrombus. Normal compressibility and flow on color Doppler imaging. Profunda Femoral Vein: No evidence of thrombus. Normal compressibility and flow on color Doppler imaging. Femoral Vein: No evidence of thrombus. Normal compressibility, respiratory phasicity and response to augmentation. Popliteal Vein: No evidence of thrombus. Normal compressibility, respiratory phasicity and response to augmentation. Calf Veins: No evidence of thrombus. Normal compressibility and flow on color Doppler imaging. Other Findings:  None. LEFT LOWER EXTREMITY Common Femoral Vein: No evidence of thrombus. Normal compressibility, respiratory phasicity and  response to augmentation. Saphenofemoral Junction: No evidence of thrombus. Normal compressibility and flow on color Doppler imaging. Profunda Femoral Vein: No evidence of thrombus. Normal compressibility and flow on color Doppler imaging. Femoral Vein: No evidence of thrombus. Normal compressibility, respiratory phasicity and response to augmentation. Popliteal Vein: No evidence of thrombus. Normal compressibility, respiratory phasicity and response to augmentation. Calf Veins: No evidence of thrombus. Normal compressibility and flow on color Doppler imaging. Other Findings:  None. IMPRESSION: No evidence of deep venous thrombosis in either lower extremity. Electronically Signed   By: Markus Daft M.D.   On: 02/13/2022 11:31   ECHOCARDIOGRAM COMPLETE  Result Date: 02/12/2022    ECHOCARDIOGRAM REPORT   Patient Name:   Peter [name] Date of Exam: 02/12/2022 Medical Rec #:  [id number]      Height:       72.0 in Accession #:    [id number]     Weight:       172.0 lb Date of Birth:  May 18, 1941      BSA:          1.998 m Patient Age:    19 years       BP:           142/64 mmHg Patient  Gender: M              HR:           71 bpm. Exam Location:  Inpatient Procedure: 2D Echo, Cardiac Doppler and Color Doppler Indications:    Stroke I63.9  History:        Patient has prior history of Echocardiogram examinations, most                 recent 02/25/2020. CAD and Previous Myocardial Infarction, COPD;                 Risk Factors:Hypertension, Diabetes, Dyslipidemia and Former                 Smoker. AAA.  Sonographer:    Darlina Sicilian RDCS Referring Phys: [id number] OLADAPO ADEFESO IMPRESSIONS  1. Left ventricular ejection fraction, by estimation, is 50 to 55%. The left ventricle has low normal function. Wall motion difficult to assess due to poor visualization of the LV endocardium, however, the inferior wall appears hypokinetic based on limited views. There is mild concentric left ventricular hypertrophy. Left ventricular  diastolic parameters are consistent with Grade I diastolic dysfunction (impaired relaxation).  2. Right ventricular systolic function is normal. The right ventricular size is normal.  3. Left atrial size was moderately dilated.  4. The mitral valve is abnormal. Mild mitral valve regurgitation. Moderate mitral annular calcification.  5. The aortic valve is tricuspid. There is mild calcification of the aortic valve. There is mild thickening of the aortic valve. Aortic valve regurgitation is mild. Aortic valve sclerosis/calcification is present, without any evidence of aortic stenosis.  6. Aortic dilatation noted. There is borderline dilatation of the aortic root, measuring 38 mm. Comparison(s): Compared to prior TTE in 02/2020, the LVEF has improved slightly to 50-55% (previously 40-45%). The inferior WMA persist. Conclusion(s)/Recommendation(s): No intracardiac source of embolism detected on this transthoracic study. Consider a transesophageal echocardiogram to exclude cardiac source of embolism if clinically indicated. FINDINGS  Left Ventricle: Left ventricular ejection fraction, by estimation, is 50 to 55%. The left ventricle has low normal function. Difficult to assess wall motion due to incomplete visualization of LV endocardium, however, the inferior wall appears hypokinetic. The left ventricular internal cavity size was normal in size. There is mild concentric left ventricular hypertrophy. Left ventricular diastolic parameters are consistent with Grade I diastolic dysfunction (impaired relaxation). Right Ventricle: The right ventricular size is normal. Right vetricular wall thickness was not well visualized. Right ventricular systolic function is normal. Left Atrium: Left atrial size was moderately dilated. Right Atrium: Right atrial size was normal in size. Pericardium: There is no evidence of pericardial effusion. Mitral Valve: The mitral valve is abnormal. There is mild thickening of the mitral valve  leaflet(s). There is mild calcification of the mitral valve leaflet(s). Moderate mitral annular calcification. Mild mitral valve regurgitation. Tricuspid Valve: The tricuspid valve is normal in structure. Tricuspid valve regurgitation is trivial. Aortic Valve: The aortic valve is tricuspid. There is mild calcification of the aortic valve. There is mild thickening of the aortic valve. Aortic valve regurgitation is mild. Aortic valve sclerosis/calcification is present, without any evidence of aortic stenosis. Pulmonic Valve: The pulmonic valve was normal in structure. Pulmonic valve regurgitation is trivial. Aorta: Aortic dilatation noted. There is borderline dilatation of the aortic root, measuring 38 mm. IAS/Shunts: The atrial septum is grossly normal.  LEFT VENTRICLE PLAX 2D LVIDd:         4.20 cm   Diastology LVIDs:  3.50 cm   LV e' medial:    5.33 cm/s LV PW:         1.00 cm   LV E/e' medial:  14.3 LV IVS:        1.50 cm   LV e' lateral:   6.05 cm/s LVOT diam:     2.20 cm   LV E/e' lateral: 12.6 LV SV:         65 LV SV Index:   33 LVOT Area:     3.80 cm  RIGHT VENTRICLE RV S prime:     12.70 cm/s TAPSE (M-mode): 1.7 cm LEFT ATRIUM             Index        RIGHT ATRIUM           Index LA diam:        4.20 cm 2.10 cm/m   RA Area:     16.10 cm LA Vol (A2C):   52.3 ml 26.17 ml/m  RA Volume:   32.70 ml  16.36 ml/m LA Vol (A4C):   77.2 ml 38.63 ml/m LA Biplane Vol: 65.2 ml 32.63 ml/m  AORTIC VALVE LVOT Vmax:   82.90 cm/s LVOT Vmean:  49.200 cm/s LVOT VTI:    0.172 m  AORTA Ao Root diam: 3.80 cm MITRAL VALVE MV Area (PHT): 2.69 cm     SHUNTS MV Decel Time: 282 msec     Systemic VTI:  0.17 m MV E velocity: 76.30 cm/s   Systemic Diam: 2.20 cm MV A velocity: 111.00 cm/s MV E/A ratio:  0.69 Gwyndolyn Kaufman MD Electronically signed by Gwyndolyn Kaufman MD Signature Date/Time: 02/12/2022/5:11:04 PM    Final      Scheduled Meds:  aspirin EC  81 mg Oral Daily   insulin aspart  0-6 Units Subcutaneous TID WC    rosuvastatin  40 mg Oral Daily   ticagrelor  60 mg Oral BID   Continuous Infusions:   LOS: 0 days    Roxan Hockey M.D on 02/13/2022 at 2:44 PM  Go to [email or web address] - for contact info  [facility] - Office  [phone]  If 7PM-7AM, please contact night-coverage [email or web address] Password [facility] 02/13/2022, 2:44 PM

## 2022-02-13 NOTE — TOC Initial Note (Signed)
Transition of Care (TOC) - Initial/Assessment Note  ? ? ?Patient Details  ?Name: Peter [name] ?MRN: [id number] ?Date of Birth: July 01, 1941 ? ?Transition of Care (TOC) CM/SW Contact:    ?Kerin Salen, RN ?Phone Number: ?02/13/2022, 10:31 AM ? ?Clinical Narrative:   ?Transition of Care (TOC) Screening Note ? ? ?Patient Details  ?Name: Peter [name] ?Date of Birth: Jun 30, 1941 ? ? ?Transition of Care (TOC) CM/SW Contact:    ?Kerin Salen, RN ?Phone Number: ?02/13/2022, 10:32 AM ? ? ? ?Transition of Care Department [facility]) has reviewed patient and no TOC needs have been identified at this time. We will continue to monitor patient advancement through interdisciplinary progression rounds. If new patient transition needs arise, please place a TOC consult. ? ?               ? ? ?  ?  ? ? ?Patient Goals and CMS Choice ?  ?  ?  ? ?Expected Discharge Plan and Services ?  ?  ?  ?  ?  ?                ?  ?  ?  ?  ?  ?  ?  ?  ?  ?  ? ?Prior Living Arrangements/Services ?  ?  ?  ?       ?  ?  ?  ?  ? ?Activities of Daily Living ?Home Assistive Devices/Equipment: Other (Comment) (cane) ?ADL Screening (condition at time of admission) ?Patient's cognitive ability adequate to safely complete daily activities?: Yes ?Is the patient deaf or have difficulty hearing?: No ?Does the patient have difficulty seeing, even when wearing glasses/contacts?: No ?Does the patient have difficulty concentrating, remembering, or making decisions?: No ?Patient able to express need for assistance with ADLs?: No ?Does the patient have difficulty dressing or bathing?: No ?Independently performs ADLs?: Yes (appropriate for developmental age) ?Does the patient have difficulty walking or climbing stairs?: No ?Weakness of Legs: None ?Weakness of Arms/Hands: None ? ?Permission Sought/Granted ?  ?  ?   ?   ?   ?   ? ?Emotional Assessment ?  ?  ?  ?  ?  ?  ? ?Admission diagnosis:  Acute CVA (cerebrovascular accident) ([place]) [I63.9] ?Acute cerebrovascular accident  (CVA) ([place]) [I63.9] ?Patient Active Problem List  ? Diagnosis Date Noted  ? Acute CVA (cerebrovascular accident) ([place]) 02/11/2022  ? 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 Chronic kidney disease, stage 3a ([place]) 02/11/2022  ? Seborrheic keratoses 06/26/2020  ? Chest pain 02/24/2020  ? NSTEMI (non-ST elevated myocardial infarction) ([place]) 02/24/2020  ? Chronic back pain 07/25/2018  ? CVA (cerebral vascular accident) ([place]) 04/14/2018  ? Stroke-like symptoms 04/12/2018  ? CAD (coronary artery disease) 04/12/2018  ? Elevated troponin 04/12/2018  ? Claudication ([place]) 04/12/2018  ? 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  PCI- LIMA-LAD insertion site DES 08/14/14 08/15/2014  ? COPD (chronic obstructive pulmonary disease) ([place]) 08/15/2014  ? 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 Plavix allergy 08/15/2014  ? Exertional angina ([place]) 08/14/2014  ? HTN (hypertension), benign 02/03/2014  ? AAA (abdominal aortic aneurysm) without rupture 02/03/2014  ? H/O ETOH abuse 04/02/2013  ? Bowel habit changes 04/02/2013  ? Dyslipidemia 03/13/2013  ? Type 2 diabetes mellitus ([place]) 03/13/2013  ? Pain, abdominal, nonspecific 03/13/2013  ? ?PCP:  Kathyrn Drown, MD ?Pharmacy:   ?[facility], [place] [address] ?[address] ?  [place] [address] ?Phone: [phone] Fax: [phone] ? ? ? ? ?Social Determinants of Health (SDOH) Interventions ?  ? ?Readmission Risk Interventions ?No flowsheet data found. ? ? ?

## 2022-02-13 NOTE — Progress Notes (Signed)
Patient getting ready for bed when entering the room. Medication given. Vs taken. No requests needed at this time.  ?

## 2022-02-13 NOTE — Plan of Care (Signed)
?  Problem: Acute Rehab PT Goals(only PT should resolve) ?Goal: Pt Will Perform Standing Balance Or Pre-Gait ?Flowsheets (Taken 02/13/2022 1134) ?Pt will perform standing balance or pre-gait: with Modified Independent ?Goal: Pt Will Ambulate ?Flowsheets (Taken 02/13/2022 1134) ?Pt will Ambulate: ? 100 feet ? with least restrictive assistive device ? with cane ?Goal: Pt Will Go Up/Down Stairs ?Flowsheets (Taken 02/13/2022 1134) ?Pt will Go Up / Down Stairs: ? 3-5 stairs ? with modified independence ?  ?

## 2022-02-14 ENCOUNTER — Observation Stay (HOSPITAL_BASED_OUTPATIENT_CLINIC_OR_DEPARTMENT_OTHER): Payer: Medicare Other

## 2022-02-14 ENCOUNTER — Observation Stay (HOSPITAL_BASED_OUTPATIENT_CLINIC_OR_DEPARTMENT_OTHER): Payer: Medicare Other | Admitting: Certified Registered Nurse Anesthetist

## 2022-02-14 ENCOUNTER — Observation Stay (HOSPITAL_COMMUNITY): Payer: Medicare Other | Admitting: Certified Registered Nurse Anesthetist

## 2022-02-14 ENCOUNTER — Observation Stay (INDEPENDENT_AMBULATORY_CARE_PROVIDER_SITE_OTHER): Payer: Medicare Other

## 2022-02-14 ENCOUNTER — Encounter (HOSPITAL_COMMUNITY)
Admission: EM | Disposition: A | Discharge status: Home / Self Care | Payer: Self-pay | Source: Home / Self Care | Attending: Emergency Medicine

## 2022-02-14 ENCOUNTER — Other Ambulatory Visit (HOSPITAL_COMMUNITY): Payer: Self-pay | Admitting: *Deleted

## 2022-02-14 ENCOUNTER — Telehealth: Payer: Self-pay | Admitting: *Deleted

## 2022-02-14 ENCOUNTER — Other Ambulatory Visit: Payer: Self-pay

## 2022-02-14 ENCOUNTER — Encounter (HOSPITAL_COMMUNITY): Payer: Self-pay | Admitting: Internal Medicine

## 2022-02-14 DIAGNOSIS — J449 Chronic obstructive pulmonary disease, unspecified: Secondary | ICD-10-CM | POA: Diagnosis not present

## 2022-02-14 DIAGNOSIS — I083 Combined rheumatic disorders of mitral, aortic and tricuspid valves: Secondary | ICD-10-CM | POA: Diagnosis not present

## 2022-02-14 DIAGNOSIS — Z8673 Personal history of transient ischemic attack (TIA), and cerebral infarction without residual deficits: Secondary | ICD-10-CM | POA: Diagnosis not present

## 2022-02-14 DIAGNOSIS — I6389 Other cerebral infarction: Secondary | ICD-10-CM | POA: Diagnosis not present

## 2022-02-14 DIAGNOSIS — Q2112 Patent foramen ovale: Secondary | ICD-10-CM | POA: Diagnosis not present

## 2022-02-14 DIAGNOSIS — I340 Nonrheumatic mitral (valve) insufficiency: Secondary | ICD-10-CM

## 2022-02-14 DIAGNOSIS — Q248 Other specified congenital malformations of heart: Secondary | ICD-10-CM

## 2022-02-14 DIAGNOSIS — I25119 Atherosclerotic heart disease of native coronary artery with unspecified angina pectoris: Secondary | ICD-10-CM | POA: Diagnosis not present

## 2022-02-14 DIAGNOSIS — I639 Cerebral infarction, unspecified: Secondary | ICD-10-CM

## 2022-02-14 DIAGNOSIS — I10 Essential (primary) hypertension: Secondary | ICD-10-CM | POA: Diagnosis not present

## 2022-02-14 DIAGNOSIS — I351 Nonrheumatic aortic (valve) insufficiency: Secondary | ICD-10-CM

## 2022-02-14 HISTORY — DX: Other specified congenital malformations of heart: Q24.8

## 2022-02-14 LAB — CBC
HCT: 36.4 % — ABNORMAL LOW (ref 39.0–52.0)
Hemoglobin: 12.2 g/dL — ABNORMAL LOW (ref 13.0–17.0)
MCH: 32.6 pg (ref 26.0–34.0)
MCHC: 33.5 g/dL (ref 30.0–36.0)
MCV: 97.3 fL (ref 80.0–100.0)
Platelets: 108 10*3/uL — ABNORMAL LOW (ref 150–400)
RBC: 3.74 MIL/uL — ABNORMAL LOW (ref 4.22–5.81)
RDW: 13.1 % (ref 11.5–15.5)
WBC: 11.2 10*3/uL — ABNORMAL HIGH (ref 4.0–10.5)
nRBC: 0 % (ref 0.0–0.2)

## 2022-02-14 LAB — GLUCOSE, CAPILLARY
Glucose-Capillary: 585 mg/dL (ref 70–99)
Glucose-Capillary: 142 mg/dL — ABNORMAL HIGH (ref 70–99)
Glucose-Capillary: 238 mg/dL — ABNORMAL HIGH (ref 70–99)
Glucose-Capillary: 253 mg/dL — ABNORMAL HIGH (ref 70–99)
Glucose-Capillary: 140 mg/dL — ABNORMAL HIGH (ref 70–99)
Glucose-Capillary: 113 mg/dL — ABNORMAL HIGH (ref 70–99)

## 2022-02-14 LAB — BASIC METABOLIC PANEL
Anion gap: 9 (ref 5–15)
BUN: 32 mg/dL — ABNORMAL HIGH (ref 8–23)
CO2: 25 mmol/L (ref 22–32)
Calcium: 8.7 mg/dL — ABNORMAL LOW (ref 8.9–10.3)
Chloride: 104 mmol/L (ref 98–111)
Creatinine, Ser: 1.6 mg/dL — ABNORMAL HIGH (ref 0.61–1.24)
GFR, Estimated: 44 mL/min — ABNORMAL LOW (ref >60)
Glucose, Bld: 122 mg/dL — ABNORMAL HIGH (ref 70–99)
Potassium: 3.9 mmol/L (ref 3.5–5.1)
Sodium: 138 mmol/L (ref 135–145)

## 2022-02-14 LAB — HEMOGLOBIN A1C
Hgb A1c MFr Bld: 6.8 % — ABNORMAL HIGH (ref 4.8–5.6)
Mean Plasma Glucose: 148 mg/dL

## 2022-02-14 SURGERY — ECHOCARDIOGRAM, TRANSESOPHAGEAL
Anesthesia: General

## 2022-02-14 MED ORDER — ASPIRIN EC 81 MG PO TBEC: 81.0000 mg | DELAYED_RELEASE_TABLET | Freq: Every day | ORAL | 3 refills | Status: AC

## 2022-02-14 MED ORDER — METOPROLOL SUCCINATE ER 25 MG PO TB24: 25.0000 mg | ORAL_TABLET | Freq: Every day | ORAL | 3 refills | Status: DC

## 2022-02-14 MED ORDER — LOSARTAN POTASSIUM 25 MG PO TABS: 25.0000 mg | ORAL_TABLET | Freq: Every day | ORAL | 3 refills | Status: DC

## 2022-02-14 MED ORDER — SODIUM CHLORIDE 0.9 % IV SOLN: INTRAVENOUS | Status: DC

## 2022-02-14 MED ORDER — PROPOFOL 10 MG/ML IV BOLUS
INTRAVENOUS | Status: AC
  Filled 2022-02-14: qty 20

## 2022-02-14 MED ORDER — BUTAMBEN-TETRACAINE-BENZOCAINE 2-2-14 % EX AERO
INHALATION_SPRAY | CUTANEOUS | Status: DC | PRN
  Administered 2022-02-14: 2 via TOPICAL

## 2022-02-14 MED ORDER — ETOMIDATE 2 MG/ML IV SOLN
INTRAVENOUS | Status: DC | PRN
  Administered 2022-02-14: 4 mg via INTRAVENOUS

## 2022-02-14 MED ORDER — PROPOFOL 10 MG/ML IV BOLUS
INTRAVENOUS | Status: DC | PRN
  Administered 2022-02-14 (×2): 20 mg via INTRAVENOUS
  Administered 2022-02-14: 40 mg via INTRAVENOUS

## 2022-02-14 MED ORDER — BUTAMBEN-TETRACAINE-BENZOCAINE 2-2-14 % EX AERO
INHALATION_SPRAY | CUTANEOUS | Status: AC
  Filled 2022-02-14: qty 5

## 2022-02-14 MED ORDER — ROSUVASTATIN CALCIUM 40 MG PO TABS: 40.0000 mg | ORAL_TABLET | Freq: Every day | ORAL | 3 refills | Status: DC

## 2022-02-14 MED ORDER — TICAGRELOR 60 MG PO TABS: 60.0000 mg | ORAL_TABLET | Freq: Two times a day (BID) | ORAL | 11 refills | Status: DC

## 2022-02-14 NOTE — Telephone Encounter (Signed)
Pt enrolled in [facility].  ?

## 2022-02-14 NOTE — TOC Transition Note (Signed)
Transition of Care (TOC) - CM/SW Discharge Note ? ? ?Patient Details  ?Name: Peter [name] ?MRN: [id number] ?Date of Birth: [birth year] ? ?Transition of Care (TOC) CM/SW Contact:  ?Boneta Lucks, RN ?Phone Number: ?02/14/2022, 4:52 PM ? ? ?Clinical Narrative   Discharging home, MD ordering Outpatient PT as recommended.  ? ? ? ?Final next level of care: Home/Self Care ?Barriers to Discharge: Barriers Resolved ? ? ?Patient Goals and CMS Choice ?Patient states their goals for this hospitalization and ongoing recovery are:: to go home. ?CMS Medicare.gov Compare Post Acute Care list provided to:: Patient ?Choice offered to / list presented to : Patient ? ?Discharge Placement ?  ?            ?Patient and family notified of of transfer: 02/14/22 ?   ? Readmission Risk Interventions ?No flowsheet data found. ? ? ? ? ?

## 2022-02-14 NOTE — Discharge Summary (Incomplete)
Peter [name], is a 81 y.o. male  DOB [birth year]  MRN [id number].  Admission date:  02/11/2022  Admitting Physician  Bernadette Hoit, DO  Discharge Date:  02/14/2022   Primary MD  Kathyrn Drown, MD  Recommendations for primary care physician for things to follow:   1) Continue current aspirin and brilinta-which are both blood thinners as well as your cholesterol medicine Crestor --Please avoid skipping medications 2)Follow up with your Vascular surgeon Dr. Donzetta Matters--   Dr Donzetta Matters for carotid artery stenosis, you already see Dr Donzetta Matters for Abdominal aortic aneurysm   3)The cardiology service will send you a heart monitor called a Zio patch --- to help monitor your heart and look for any irregular heartbeat like atrial fibrillation   4)Avoid ibuprofen/Advil/Aleve/Motrin/Goody Powders/Naproxen/BC powders/Meloxicam/Diclofenac/Indomethacin and other Nonsteroidal anti-inflammatory medications as these will make you more likely to bleed and can cause stomach ulcers, can also cause Kidney problems.   5) outpatient follow-up with neurologist in 4 to 6 weeks for stroke follow-up advised  Admission Diagnosis  Acute CVA (cerebrovascular accident) [facility]) [I63.9] Acute cerebrovascular accident (CVA) ([place]) [I63.9]   Discharge Diagnosis  Acute CVA (cerebrovascular accident) ([place]) [I63.9] Acute cerebrovascular accident (CVA) ([place]) [I63.9]    Principal Problem:   Acute CVA (cerebrovascular accident) [facility]) Active Problems:   CAD-CABG '97- multiple PCIs since   Chronic kidney disease, stage 3a ([place])   Dyslipidemia   Type 2 diabetes mellitus ([place])   HTN (hypertension), benign   AAA (abdominal aortic aneurysm) without rupture   Chronic idiopathic thrombocytopenia ([place])      Past Medical History:  Diagnosis Date   AAA (abdominal aortic aneurysm)    CAD (coronary artery disease)    a. s/p CABG in 1997 b. stent to SVG-PDA  and PTCA of LAD in 2003 c. cath in 2015 showing patent SVG-OM1-OM2, SVG-RI, SVG-D1, and LIMA-LAD with occluded SVG-PDA and severe stenosis of mid-LAD at LIMA insertion with DES placed d. 04/2018: similar results to 2015 with patent LAD stent; occlusion of small caliber D1 stenosis with medical management recommended.    Chronic back pain 07/25/2018   Chronic lumbar pain uses hydrocodone intermittently   Chronic systolic CHF (congestive heart failure) (HCC)    COPD (chronic obstructive pulmonary disease) ([place])    Diabetes mellitus    diet controlled   Dyslipidemia    Hypertension    Myocardial infarction [facility]) 1997   stents x2 (2003/2007)    Past Surgical History:  Procedure Laterality Date   CORONARY ANGIOPLASTY  2003/2007   2 stents   CORONARY ANGIOPLASTY WITH STENT PLACEMENT  08/14/14   DES to LIMA-LAD insertion   CORONARY ARTERY BYPASS GRAFT  1997   7 vessels   CORONARY STENT INTERVENTION N/A 02/24/2020   Procedure: CORONARY STENT INTERVENTION;  Surgeon: Sherren Mocha, MD;  Location: [facility] CV [facility];  Service: Cardiovascular;  Laterality: N/A;   CORONARY STENT PLACEMENT  08/15/2014   MID LAD  DES  by Dr Leslye Peer ANGIOGRAPHY N/A 02/24/2020   Procedure:  CORONARY/GRAFT ANGIOGRAPHY;  Surgeon: Sherren Mocha, MD;  Location: [facility];  Service: Cardiovascular;  Laterality: N/A;   CYSTECTOMY  2005   top of head-[place]   EAR CYST EXCISION  07/09/2012   Procedure: CYST REMOVAL;  Surgeon: Jamesetta So, MD;  Location: [facility] ORS;  Service: General;  Laterality: N/A;   LEFT HEART CATH AND CORS/GRAFTS ANGIOGRAPHY N/A 04/16/2018   Procedure: LEFT HEART CATH AND CORS/GRAFTS ANGIOGRAPHY;  Surgeon: Martinique, Peter M, MD;  Location: [place] [facility];  Service: Cardiovascular;  Laterality: N/A;   LEFT HEART CATHETERIZATION WITH CORONARY/GRAFT ANGIOGRAM N/A 08/14/2014   Procedure: LEFT HEART CATHETERIZATION WITH Beatrix Fetters;  Surgeon: Blane Ohara, MD;   Location[facility] C[facility] LAB;  Service: Cardiovascular;  Laterality: N/A;       HPI  from the history and physical done on the day of admission:     Chief Complaint: No chief complaint on file.   HPI: MICCO [name] is a 81 y.o. male with medical history significant of hypertension, hyperlipidemia, CAD s/p CABG (1997), tinnitus, T2DM, CKD 3A who presents to the emergency department after being brought to the ED by an MRI tech.  Patient complained of recent worsening symptoms of tinnitus, so he went to his PCP with complaint of being unable to rest and hearing of his heartbeat in his ears, he was asked to do an outpatient MRI, after the MRI was done, the radiology tech was concerned about the findings, so he was brought to the ED for further evaluation and management.  Patient denies fever, chills, chest pain, shortness of breath, nausea, vomiting, abdominal pain   ED course: In the emergency department, he was hemodynamically stable.  Work-up in the ED showed normal CBC except for thrombocytopenia, BUN/creatinine 22/1.51 (baseline creatinine at 1.3-1.7), urine drug screen was negative, urinalysis was negative for UTI, alcohol level was less than 10.  Influenza A, B, SARS coronavirus 2 was negative. MRI of brain with and without contrast showed multiple punctate supratentorial acute infarcts bilaterally with suspicion for embolic etiology. Neurologist at [facility] (Dr. Theda Sers) was consulted and he advised to do CT angiography of head and neck and echo and for patient to continue with aspirin and Brilinta and the patient can stay here at [facility] and to put in for teleneurology consult in the morning per ED PA.  Hospitalist was asked to admit patient for further evaluation and management.   Review of Systems: As mentioned in the history of present illness. All other systems reviewed and are negative.   Hospital Course:     Brief Narrative:    81 y.o. male with medical history significant of hypertension,  hyperlipidemia, CAD s/p CABG (1997), tinnitus, T2DM, CKD 3A admitted on 02/11/2022 with acute strokes in multiple vascular territories raising concerns for possible embolic phenomena   Assessment and Plan:  1)Acute CVA (cerebrovascular accident) ([place]) -- MRI brain showed Multiple punctate supratentorial acute infarcts bilaterally, Given involvement of multiple vascular territories, consider an embolic etiology -Continue aspirin, Brilinta and Crestor -Patient may need DOAC if any evidence of DVT with PFO or, Intracardiac thrombus or A-fib or severely low EF on echo -Discussed with neurologist Dr. Chelsea Primus TEE -If TEE is negative patient will need Zio patch/heart monitor to rule out A-fib ---CTA head and neck with no LVO however patient has  80% bilateral proximal ICA stenoses. Prominent soft plaque in both carotid bulbs with plaque ulceration and Severe left V4 stenosis----??? if candidate for carotid endarterectomy --LDL is  52, HDL is 39, A1c pending -Echo with EF of 50-55 %, possible inferior wall hypokinesis and grade 1 diastolic dysfunction--- compared to prior echo from 2021 EF is improved from 40 to 45% to 50-55, and inferior wall motion abnormalities -Physical therapy evaluation appreciated--recommend outpatient PT -Awaiting official cardiology consult for possible TEE on 02/14/2022 -Awaiting official neurology consult on 02/14/2022   2)PAD----patient has abdominal aortic aneurysm greater than 5 cm, currently being investigated for possible intervention -Currently on aspirin, Brilinta and Crestor   3)CAD with history of CABG in 1997 with subsequent PCI 2003 and 2008, most recent cardiac catheterization demonstrating 90% stenosis just beyond the graft insertion site of the LIMA to LAD. This was intervened with drug-eluting stent in September of 2015 -c/n aspirin, Brilinta and Crestor -Metoprolol on hold due to acute stroke to allow for blood pressure   4)Chronic kidney disease,  stage 3a (HCC) BUN/creatinine 22/1.51 (baseline creatinine at 1.3-1.7 Renally adjust medications, avoid nephrotoxic agents/dehydration/hypotension   5)Chronic idiopathic thrombocytopenia (HCC) Platelets 120.  No sign of any bleeding or vomiting of blood or blood in stool   6)Type 2 diabetes mellitus (HCC) A1c typically for patient is less than 7 reflecting excellent diabetic control PTA - A1c pending Patient was not on any antidiabetic medication Use Novolog/Humalog Sliding scale insulin with Accu-Cheks/Fingersticks as ordered    7)HTN--allow permissive hypertension due to acute stroke so hold losartan and metoprolol   Disposition/--   Disposition: The patient is from: Home              Anticipated d/c is to: Home        Code Status :  -  Code Status: Full Code    Family Communication:    (patient is alert, awake and coherent) Discussed with pts son Antony Haste at bedside  Discharge Condition: ***  Follow UP   Follow-up Information     Waynetta Sandy, MD. Schedule an appointment as soon as possible for a visit in 1 week(s).   Specialties: Vascular Surgery, Cardiology Why: Acute Stroke and Carotid Artery stenosis Contact information: [place] [address] [phone]         Garvin Fila, MD. Schedule an appointment as soon as possible for a visit in 1 month(s).   Specialties: Neurology, Radiology Why: Acute stroke follow-up Contact information: [address] [place] [address] [phone]                  Consults obtained - ***  Diet and Activity recommendation:  As advised  Discharge Instructions    **** Discharge Instructions     Ambulatory referral to Physical Therapy   Complete by: As directed    Call MD for:  difficulty breathing, headache or visual disturbances   Complete by: As directed    Call MD for:  persistant dizziness or light-headedness   Complete by: As directed    Call MD for:  persistant  nausea and vomiting   Complete by: As directed    Call MD for:  temperature >100.4   Complete by: As directed    Diet - low sodium heart healthy   Complete by: As directed    Diet Carb Modified   Complete by: As directed    Discharge instructions   Complete by: As directed    1) Continue current aspirin and brilinta-which are both blood thinners as well as your cholesterol medicine Crestor --Please avoid skipping medications 2)Follow up with your Vascular surgeon Dr. Donzetta Matters--  Dr Donzetta Matters for carotid artery stenosis, you already see Dr Donzetta Matters for Abdominal aortic aneurysm   3)The cardiology service will send you a heart monitor called a Zio patch --- to help monitor your heart and look for any irregular heartbeat like atrial fibrillation   4)Avoid ibuprofen/Advil/Aleve/Motrin/Goody Powders/Naproxen/BC powders/Meloxicam/Diclofenac/Indomethacin and other Nonsteroidal anti-inflammatory medications as these will make you more likely to bleed and can cause stomach ulcers, can also cause Kidney problems.   5) outpatient follow-up with neurologist in 4 to 6 weeks for stroke follow-up advised   Increase activity slowly   Complete by: As directed          Discharge Medications     Allergies as of 02/14/2022       Reactions   Dye Fdc Red [red Dye] Hives   hives   Ivp Dye [iodinated Contrast Media] Hives   Plavix [clopidogrel] Hives   Unsure if dye or Plavix   Keflex [cephalexin] Other (See Comments)   hoarsness        Medication List     STOP taking these medications    nitroGLYCERIN 0.4 MG SL tablet Commonly known as: NITROSTAT   predniSONE 50 MG tablet Commonly known as: DELTASONE       TAKE these medications    Accu-Chek Guide test strip Generic drug: glucose blood USE 1 STRIP TO CHECK GLUCOSE TWICE DAILY DUE  TO  FLUCTUATING  SUGARS   Aloe Vera Juice Liqd Take 8 oz by mouth daily.   aspirin EC 81 MG tablet Take 1 tablet (81 mg total) by mouth daily with  breakfast. What changed: when to take this   HYDROcodone-acetaminophen 10-325 MG tablet Commonly known as: NORCO Take one tablet po BID prn pain . Caution Drowsiness What changed: Another medication with the same name was removed. Continue taking this medication, and follow the directions you see here.   losartan 25 MG tablet Commonly known as: COZAAR Take 1 tablet (25 mg total) by mouth daily.   metoprolol succinate 25 MG 24 hr tablet Commonly known as: TOPROL-XL Take 1 tablet (25 mg total) by mouth daily.   rosuvastatin 40 MG tablet Commonly known as: CRESTOR Take 1 tablet (40 mg total) by mouth daily.   ticagrelor 60 MG Tabs tablet Commonly known as: Brilinta Take 1 tablet (60 mg total) by mouth 2 (two) times daily.   vitamin E 180 MG (400 UNITS) capsule Generic drug: vitamin E Take 400 Units by mouth daily.        Major procedures and Radiology Reports - PLEASE review detailed and final reports for all details, in brief -   ***  CT ANGIO HEAD NECK W WO CM  Result Date: 02/12/2022 CLINICAL DATA:  Neuro deficit, acute, stroke suspected. Punctate acute infarcts on head MRI yesterday. EXAM: CT ANGIOGRAPHY HEAD AND NECK TECHNIQUE: Multidetector CT imaging of the head and neck was performed using the standard protocol during bolus administration of intravenous contrast. Multiplanar CT image reconstructions and MIPs were obtained to evaluate the vascular anatomy. Carotid stenosis measurements (when applicable) are obtained utilizing NASCET criteria, using the distal internal carotid diameter as the denominator. RADIATION DOSE REDUCTION: This exam was performed according to the departmental dose-optimization program which includes automated exposure control, adjustment of the mA and/or kV according to patient size and/or use of iterative reconstruction technique. CONTRAST:  17m OMNIPAQUE IOHEXOL 350 MG/ML SOLN COMPARISON:  Head MRI 02/11/2022.  Head and neck CTA 04/12/2018.  FINDINGS: CT HEAD FINDINGS Brain: No acute large territory  infarct, intracranial hemorrhage, mass, midline shift, or extra-axial fluid collection is identified. The punctate infarcts on yesterday's MRI are not well demonstrated by CT. There is mild cerebral atrophy. Hypodensities in the cerebral white matter bilaterally are nonspecific but compatible with mild chronic small vessel ischemic disease. Chronic lacunar infarcts are noted in the basal ganglia. A small chronic left cerebellar infarct is also again noted. Vascular: Calcified atherosclerosis at the skull base. Skull: No fracture or suspicious osseous lesion. Sinuses: Paranasal sinuses and mastoid air cells are clear. Orbits: Right cataract extraction. Review of the MIP images confirms the above findings CTA NECK FINDINGS Aortic arch: Standard 3 vessel aortic arch with a moderate amount of calcified and soft plaque in the arch and included distal descending thoracic aorta. Atherosclerotic irregularity of the brachiocephalic and subclavian arteries without a flow limiting stenosis. Right carotid system: Patent with extensive calcified and soft plaque in the carotid bulb resulting in 75-80% proximal ICA stenosis, greatly progressed from the prior CTA. 4 mm focus of plaque ulceration posteriorly in the bulb. Mild atherosclerotic irregularity of the distal cervical ICA without significant stenosis. Left carotid system: Patent with extensive calcified and soft plaque in the carotid bulb resulting in a new 80% stenosis at the distal aspect of the bulb. Mild plaque ulceration. Vertebral arteries: Patent without evidence of a dissection or significant stenosis on the left. New mild stenosis of the right vertebral artery near the V3-V4 junction. Skeleton: Cervical disc degeneration greatest at C5-6. Other neck: No evidence of cervical lymphadenopathy or mass. Upper chest: Mild centrilobular and paraseptal emphysema. No apical lung consolidation or mass. Review of the  MIP images confirms the above findings CTA HEAD FINDINGS Anterior circulation: The internal carotid arteries are patent from skull base to carotid termini with atherosclerotic plaque resulting in progressive, moderate bilateral supraclinoid stenoses. ACAs and MCAs are patent without evidence of a proximal branch occlusion. There are new bilateral M1 stenoses. No aneurysm is identified. Posterior circulation: The intracranial vertebral arteries are patent to the basilar with progressive atherosclerosis resulting in up to mild right and severe left V4 segment stenoses. The basilar artery is patent with atherosclerotic irregularity but no significant stenosis. Posterior communicating arteries are diminutive or absent. Both PCAs are patent, and there is a new moderate mid right P2 stenosis. There is mild narrowing of the left P2 segment which is less prominent than on the prior CTA. No aneurysm is identified. Venous sinuses: Absent opacification of the left sigmoid sinus which is attributed to arterial dominant contrast timing and slower flow in this nondominant sinus rather than occlusion given normal opacification on yesterday's MRI. Anatomic variants: None. Review of the MIP images confirms the above findings IMPRESSION: 1. Progressive atherosclerosis in the head and neck. No large vessel occlusion. 2. 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 Prominent soft plaque in both carotid bulbs with plaque ulceration. 3. Moderate bilateral intracranial ICA stenoses. 4. Severe left V4 stenosis. 5. Aortic Atherosclerosis (ICD10-I70.0) and Emphysema (ICD10-J43.9). Electronically Signed   By: Logan Bores M.D.   On: 02/12/2022 13:04   MR Brain W Wo Contrast  Result Date: 02/11/2022 CLINICAL DATA:  pulsitile tinnitus EXAM: MRI HEAD WITHOUT AND WITH CONTRAST TECHNIQUE: Multiplanar, multiecho pulse sequences of the brain and surrounding structures were obtained without and with intravenous contrast. CONTRAST:  65m GADAVIST  GADOBUTROL 1 MMOL/ML IV SOLN COMPARISON:  None. FINDINGS: Brain: Multiple punctate acute infarcts bilaterally, including punctate infarcts in bilateral frontal lobes (series 5, images 31, 35, and 38), right basal ganglia (series  5, image 25), right periventricular parieto-occipital region (series 5, image 19). No hydrocephalus, mass lesion, midline shift, or extra-axial fluid collection. Cerebral atrophy with ex vacuo ventricular dilation. Mild to moderate scattered T2/FLAIR hyperintensities in the white matter, nonspecific but compatible with chronic microvascular ischemic disease. Remote inferior left cerebellar infarct. No abnormal enhancement. Dedicated IAC protocol was performed. The visualized seventh and eighth cranial nerves are unremarkable bilaterally. Normal T2 hyperintense signal within the labyrinth bilaterally. No abnormal enhancement or evidence of a retrocochlear mass. Small right mastoid effusion. Vascular: Abnormal signal within the left intradural vertebral artery on T2 imaging with apparent filling defect versus atherosclerotic plaque on postcontrast imaging. otherwise, major arterial flow voids are maintained at the skull base. Skull and upper cervical spine: Normal marrow signal. Sinuses/Orbits: Negative. Other: No mastoid effusions. IMPRESSION: 1. Multiple punctate supratentorial acute infarcts bilaterally, detailed above. Given involvement of multiple vascular territories, consider an embolic etiology. No significant mass effect. 2. Abnormal signal within the left intradural vertebral artery on T2 imaging with apparent filling defect versus atherosclerotic plaque on postcontrast imaging. Recommend CTA or MRA to further evaluate. 3. No retrocochlear mass. 4. Cerebral atrophy, chronic microvascular ischemic disease and remote inferior left cerebellar infarct. These results will be called to the ordering clinician or representative by the Radiologist Assistant, and communication documented in  the PACS or [employer]. Electronically Signed   By: Margaretha Sheffield M.D.   On: 02/11/2022 17:07   US Venous Img Lower Bilateral (DVT)  Result Date: 02/13/2022 CLINICAL DATA:  Bilateral lower extremity edema. History of right great saphenous vein harvest. EXAM: BILATERAL LOWER EXTREMITY VENOUS DOPPLER ULTRASOUND TECHNIQUE: Gray-scale sonography with graded compression, as well as color Doppler and duplex ultrasound were performed to evaluate the lower extremity deep venous systems from the level of the common femoral vein and including the common femoral, femoral, profunda femoral, popliteal and calf veins including the posterior tibial, peroneal and gastrocnemius veins when visible. The superficial great saphenous vein was also interrogated. Spectral Doppler was utilized to evaluate flow at rest and with distal augmentation maneuvers in the common femoral, femoral and popliteal veins. COMPARISON:  None. FINDINGS: RIGHT LOWER EXTREMITY Common Femoral Vein: No evidence of thrombus. Normal compressibility, respiratory phasicity and response to augmentation. Saphenofemoral Junction: No evidence of thrombus. Normal compressibility and flow on color Doppler imaging. Profunda Femoral Vein: No evidence of thrombus. Normal compressibility and flow on color Doppler imaging. Femoral Vein: No evidence of thrombus. Normal compressibility, respiratory phasicity and response to augmentation. Popliteal Vein: No evidence of thrombus. Normal compressibility, respiratory phasicity and response to augmentation. Calf Veins: No evidence of thrombus. Normal compressibility and flow on color Doppler imaging. Other Findings:  None. LEFT LOWER EXTREMITY Common Femoral Vein: No evidence of thrombus. Normal compressibility, respiratory phasicity and response to augmentation. Saphenofemoral Junction: No evidence of thrombus. Normal compressibility and flow on color Doppler imaging. Profunda Femoral Vein: No evidence of thrombus.  Normal compressibility and flow on color Doppler imaging. Femoral Vein: No evidence of thrombus. Normal compressibility, respiratory phasicity and response to augmentation. Popliteal Vein: No evidence of thrombus. Normal compressibility, respiratory phasicity and response to augmentation. Calf Veins: No evidence of thrombus. Normal compressibility and flow on color Doppler imaging. Other Findings:  None. IMPRESSION: No evidence of deep venous thrombosis in either lower extremity. Electronically Signed   By: Markus Daft M.D.   On: 02/13/2022 11:31   ECHOCARDIOGRAM COMPLETE  Result Date: 02/12/2022    ECHOCARDIOGRAM REPORT   Patient Name:   Elijio L  [facility] Date of Exam: 02/12/2022 Medical Rec #:  [id number]      Height:       72.0 in Accession #:    [id number]     Weight:       172.0 lb Date of Birth:  20-Nov-1941      BSA:          1.998 m Patient Age:    68 years       BP:           142/64 mmHg Patient Gender: M              HR:           71 bpm. Exam Location:  Inpatient Procedure: 2D Echo, Cardiac Doppler and Color Doppler Indications:    Stroke I63.9  History:        Patient has prior history of Echocardiogram examinations, most                 recent 02/25/2020. CAD and Previous Myocardial Infarction, COPD;                 Risk Factors:Hypertension, Diabetes, Dyslipidemia and Former                 Smoker. AAA.  Sonographer:    Darlina Sicilian RDCS Referring Phys: [id number] OLADAPO ADEFESO IMPRESSIONS  1. Left ventricular ejection fraction, by estimation, is 50 to 55%. The left ventricle has low normal function. Wall motion difficult to assess due to poor visualization of the LV endocardium, however, the inferior wall appears hypokinetic based on limited views. There is mild concentric left ventricular hypertrophy. Left ventricular diastolic parameters are consistent with Grade I diastolic dysfunction (impaired relaxation).  2. Right ventricular systolic function is normal. The right ventricular size is normal.  3.  Left atrial size was moderately dilated.  4. The mitral valve is abnormal. Mild mitral valve regurgitation. Moderate mitral annular calcification.  5. The aortic valve is tricuspid. There is mild calcification of the aortic valve. There is mild thickening of the aortic valve. Aortic valve regurgitation is mild. Aortic valve sclerosis/calcification is present, without any evidence of aortic stenosis.  6. Aortic dilatation noted. There is borderline dilatation of the aortic root, measuring 38 mm. Comparison(s): Compared to prior TTE in 02/2020, the LVEF has improved slightly to 50-55% (previously 40-45%). The inferior WMA persist. Conclusion(s)/Recommendation(s): No intracardiac source of embolism detected on this transthoracic study. Consider a transesophageal echocardiogram to exclude cardiac source of embolism if clinically indicated. FINDINGS  Left Ventricle: Left ventricular ejection fraction, by estimation, is 50 to 55%. The left ventricle has low normal function. Difficult to assess wall motion due to incomplete visualization of LV endocardium, however, the inferior wall appears hypokinetic. The left ventricular internal cavity size was normal in size. There is mild concentric left ventricular hypertrophy. Left ventricular diastolic parameters are consistent with Grade I diastolic dysfunction (impaired relaxation). Right Ventricle: The right ventricular size is normal. Right vetricular wall thickness was not well visualized. Right ventricular systolic function is normal. Left Atrium: Left atrial size was moderately dilated. Right Atrium: Right atrial size was normal in size. Pericardium: There is no evidence of pericardial effusion. Mitral Valve: The mitral valve is abnormal. There is mild thickening of the mitral valve leaflet(s). There is mild calcification of the mitral valve leaflet(s). Moderate mitral annular calcification. Mild mitral valve regurgitation. Tricuspid Valve: The tricuspid valve is normal in  structure. Tricuspid valve regurgitation is trivial. Aortic Valve: The aortic  valve is tricuspid. There is mild calcification of the aortic valve. There is mild thickening of the aortic valve. Aortic valve regurgitation is mild. Aortic valve sclerosis/calcification is present, without any evidence of aortic stenosis. Pulmonic Valve: The pulmonic valve was normal in structure. Pulmonic valve regurgitation is trivial. Aorta: Aortic dilatation noted. There is borderline dilatation of the aortic root, measuring 38 mm. IAS/Shunts: The atrial septum is grossly normal.  LEFT VENTRICLE PLAX 2D LVIDd:         4.20 cm   Diastology LVIDs:         3.50 cm   LV e' medial:    5.33 cm/s LV PW:         1.00 cm   LV E/e' medial:  14.3 LV IVS:        1.50 cm   LV e' lateral:   6.05 cm/s LVOT diam:     2.20 cm   LV E/e' lateral: 12.6 LV SV:         65 LV SV Index:   33 LVOT Area:     3.80 cm  RIGHT VENTRICLE RV S prime:     12.70 cm/s TAPSE (M-mode): 1.7 cm LEFT ATRIUM             Index        RIGHT ATRIUM           Index LA diam:        4.20 cm 2.10 cm/m   RA Area:     16.10 cm LA Vol (A2C):   52.3 ml 26.17 ml/m  RA Volume:   32.70 ml  16.36 ml/m LA Vol (A4C):   77.2 ml 38.63 ml/m LA Biplane Vol: 65.2 ml 32.63 ml/m  AORTIC VALVE LVOT Vmax:   82.90 cm/s LVOT Vmean:  49.200 cm/s LVOT VTI:    0.172 m  AORTA Ao Root diam: 3.80 cm MITRAL VALVE MV Area (PHT): 2.69 cm     SHUNTS MV Decel Time: 282 msec     Systemic VTI:  0.17 m MV E velocity: 76.30 cm/s   Systemic Diam: 2.20 cm MV A velocity: 111.00 cm/s MV E/A ratio:  0.69 Gwyndolyn Kaufman MD Electronically signed by Gwyndolyn Kaufman MD Signature Date/Time: 02/12/2022/5:11:04 PM    Final    CT ANGIO ABDOMEN PELVIS  W &/OR WO CONTRAST  Result Date: 01/21/2022 CLINICAL DATA:  81 year old male with history of abdominal aortic aneurysm. Follow-up study. EXAM: CT ANGIOGRAPHY ABDOMEN AND PELVIS WITH CONTRAST AND WITHOUT CONTRAST TECHNIQUE: Multidetector CT imaging of the abdomen and  pelvis was performed using the standard protocol during bolus administration of intravenous contrast. Multiplanar reconstructed images and MIPs were obtained and reviewed to evaluate the vascular anatomy. RADIATION DOSE REDUCTION: This exam was performed according to the departmental dose-optimization program which includes automated exposure control, adjustment of the mA and/or kV according to patient size and/or use of iterative reconstruction technique. CONTRAST:  76m OMNIPAQUE IOHEXOL 350 MG/ML SOLN COMPARISON:  04/10/2019 FINDINGS: VASCULAR Aorta: Again seen is a fusiform juxtarenal abdominal aortic aneurysm which measures up to 53 mm in maximum short axis dimension, enlarged from 48 mm by my measurements. The suprarenal abdominal aorta is patent with circumferential fibrofatty and calcific atherosclerotic changes. Celiac: Similar appearing moderate ostial stenosis of the celiac trunk secondary to atherosclerotic plaque. Patent distally. SMA: Patent without evidence of aneurysm, dissection, vasculitis or significant stenosis. Renals: Dual right renal arteries with at least moderate ostial stenosis of the main right renal artery secondary to  atherosclerotic plaque. Dual left renal arteries with at least moderate ostial stenosis of each secondary to atherosclerotic plaque. Patent distally. IMA: Severe ostial stenosis secondary to focal ostial occlusion secondary to atherosclerotic plaque. Patent distally. Inflow: Similar appearing mild ectasia the common iliac arteries with near circumferential atherosclerotic calcifications. 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 Proximal Outflow: Bilateral common femoral and visualized portions of the superficial and profunda femoral arteries are patent without evidence of aneurysm, dissection, vasculitis or significant stenosis. Veins: No obvious venous abnormality within the limitations of this arterial phase study.  Review of the MIP images confirms the above findings. NON-VASCULAR Lower chest: No acute abnormality. Hepatobiliary: No focal liver abnormality is seen. No gallstones, gallbladder wall thickening, or biliary dilatation. Pancreas: Unremarkable. No pancreatic ductal dilatation or surrounding inflammatory changes. Spleen: Normal in size without focal abnormality. Adrenals/Urinary Tract: Adrenal glands are unremarkable. Similar appearing right renal atrophy with exophytic medial interpolar simple cyst measuring up to 4.3 cm. No focal renal masses, hydronephrosis, or renal calculi. Bladder is unremarkable. Bladder is unremarkable. Stomach/Bowel: Stomach is within normal limits. Appendix appears normal. Sigmoid and descending colonic diverticula without surrounding inflammatory change. No evidence of bowel wall thickening, distention, or inflammatory changes. Lymphatic: No abdominopelvic lymphadenopathy. Reproductive: Prostate is unremarkable. Other: No abdominal wall hernia or abnormality. No abdominopelvic ascites. Musculoskeletal: No acute or significant osseous findings. IMPRESSION: VASCULAR 1. Interval enlargement of previously visualized juxtarenal fusiform abdominal aortic aneurysm measuring up to 5.3 cm, previously 4.8 cm. Recommend referral to a vascular specialist if not already obtained. This recommendation follows ACR consensus guidelines: White Paper of the ACR Incidental Findings Committee II on Vascular Findings. J Am Coll Radiol 2013; 10:789-794. 2. 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 3. 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 NON-VASCULAR 1. No acute abdominopelvic abnormality. 2. Diverticulosis. Ruthann Cancer, MD [facility] Radiology Electronically Signed   By: Ruthann Cancer M.D.   On: 01/21/2022 14:07    Micro Results    *** Recent Results (from the past 240 hour(s))  Resp Panel by RT-PCR (Flu A&B, Covid) Nasopharyngeal Swab     Status: None   Collection Time: 02/11/22  6:06 PM   Specimen: Nasopharyngeal Swab; Nasopharyngeal(NP) swabs in vial transport medium  Result Value Ref Range Status   SARS Coronavirus 2 by RT PCR NEGATIVE NEGATIVE Final    Comment: (NOTE) SARS-CoV-2 target nucleic acids are NOT DETECTED.  The SARS-CoV-2 RNA is generally detectable in upper respiratory specimens during the acute phase of infection. The lowest concentration of SARS-CoV-2 viral copies this assay can detect is 138 copies/mL. A negative result does not preclude SARS-Cov-2 infection and should not be used as the sole basis for treatment or other patient management decisions. A negative result may occur with  improper specimen collection/handling, submission of specimen other than nasopharyngeal swab, presence of viral mutation(s) within the areas targeted by this assay, and inadequate number of viral copies(<138 copies/mL). A negative result must be combined with clinical observations, patient history, and epidemiological information. The expected result is Negative.  Fact Sheet for Patients:  EntrepreneurPulse.com.au  Fact Sheet for Healthcare Providers:  IncredibleEmployment.be  This test is no t yet approved or cleared by the [place] FDA and  has been authorized for detection and/or diagnosis of SARS-CoV-2 by FDA under an Emergency Use Authorization (EUA). This EUA will remain  in effect (meaning this test can be used) for the duration of the COVID-19 declaration under Section 564(b)(1) of the Act, 21 U.S.C.section 360bbb-3(b)(1), unless the authorization is terminated  or revoked sooner.       Influenza A by PCR NEGATIVE NEGATIVE Final   Influenza B by PCR NEGATIVE NEGATIVE Final    Comment: (NOTE) The Xpert Xpress SARS-CoV-2/FLU/RSV plus assay is intended as  an aid in the diagnosis of influenza from Nasopharyngeal swab specimens and should not be used as a sole basis for treatment. Nasal washings and aspirates are unacceptable for Xpert Xpress SARS-CoV-2/FLU/RSV testing.  Fact Sheet for Patients: EntrepreneurPulse.com.au  Fact Sheet for Healthcare Providers: IncredibleEmployment.be  This test is not yet approved or cleared by the [place] FDA and has been authorized for detection and/or diagnosis of SARS-CoV-2 by FDA under an Emergency Use Authorization (EUA). This EUA will remain in effect (meaning this test can be used) for the duration of the COVID-19 declaration under Section 564(b)(1) of the Act, 21 U.S.C. section 360bbb-3(b)(1), unless the authorization is terminated or revoked.  Performed at [facility], [address], [place], [place] [address]     Today   Subjective    Peter [name] today has no ***          Patient has been seen and examined prior to discharge   Objective   Blood pressure (!) 150/68, pulse 66, temperature 98.4 F (36.9 C), temperature source Oral, resp. rate 18, height 6' (1.829 m), weight 78 kg, SpO2 98 %.   Intake/Output Summary (Last 24 hours) at 02/14/2022 1547 Last data filed at 02/14/2022 1300 Gross per 24 hour  Intake 440 ml  Output 850 ml  Net -410 ml    Exam Gen:- Awake Alert, no acute distress *** HEENT:- [place].AT, No sclera icterus Neck-Supple Neck,No JVD,.  Lungs-  CTAB , good air movement bilaterally CV- S1, S2 normal, regular Abd-  +ve B.Sounds, Abd Soft, No tenderness,    Extremity/Skin:- No  edema,   good pulses Psych-affect is appropriate, oriented x3 Neuro-no new focal deficits, no tremors ***   Data Review   CBC w Diff:  Lab Results  Component Value Date   WBC 11.2 (H) 02/14/2022   HGB 12.2 (L) 02/14/2022   HGB 13.1 11/22/2021   HCT 36.4 (L) 02/14/2022   HCT 38.3 11/22/2021   PLT 108 (L) 02/14/2022   PLT 132 (L) 11/22/2021    LYMPHOPCT 34 02/11/2022   MONOPCT 7 02/11/2022   EOSPCT 7 02/11/2022   BASOPCT 1 02/11/2022    CMP:  Lab Results  Component Value Date   NA 138 02/14/2022   NA 140 11/22/2021   K 3.9 02/14/2022   CL 104 02/14/2022   CO2 25 02/14/2022   BUN 32 (H) 02/14/2022   BUN 22 11/22/2021   CREATININE 1.60 (H) 02/14/2022   CREATININE 1.05 08/06/2014   PROT 7.2 02/12/2022   PROT 7.6 04/09/2021   ALBUMIN 3.7 02/12/2022   ALBUMIN 4.7 04/09/2021   BILITOT 0.6 02/12/2022   BILITOT 0.7 04/09/2021   ALKPHOS 69 02/12/2022   AST 24 02/12/2022   ALT 19 02/12/2022  .  Total Discharge time is about 33 minutes  Roxan Hockey M.D on 02/14/2022 at 3:47 PM  Go to [email or web address] -  for contact info  [facility] - Office  [phone]

## 2022-02-14 NOTE — Anesthesia Preprocedure Evaluation (Signed)
Anesthesia Evaluation  ?Patient identified by MRN, date of birth, ID band ?Patient awake ? ? ? ?Reviewed: ?Allergy & Precautions, H&P , NPO status , Patient's Chart, lab work & pertinent test results, reviewed documented beta blocker date and time  ? ?Airway ?Mallampati: II ? ?TM Distance: >3 FB ?Neck ROM: full ? ? ? Dental ?no notable dental hx. ? ?  ?Pulmonary ?COPD, former smoker,  ?  ?Pulmonary exam normal ?breath sounds clear to auscultation ? ? ? ? ? ? Cardiovascular ?Exercise Tolerance: Good ?hypertension, + angina + CAD, + Past MI, + Cardiac Stents, + CABG, + Peripheral Vascular Disease and +CHF  ? ?Rhythm:regular Rate:Normal ? ? ?  ?Neuro/Psych ?CVA negative psych ROS  ? GI/Hepatic ?negative GI ROS, Neg liver ROS,   ?Endo/Other  ?negative endocrine ROSdiabetes, Type 2 ? Renal/GU ?CRFRenal disease  ?negative genitourinary ?  ?Musculoskeletal ? ? Abdominal ?  ?Peds ? Hematology ? ?(+) Blood dyscrasia, anemia ,   ?Anesthesia Other Findings ?TEE for acute stroke.? ? ? ? ?1. Left ventricular ejection fraction, by estimation, is 50 to 55%. The  ?left ventricle has low normal function. Wall motion difficult to assess  ?due to poor visualization of the LV endocardium, however, the inferior  ?wall appears hypokinetic based on  ?limited views. There is mild concentric left ventricular hypertrophy. Left  ?ventricular diastolic parameters are consistent with Grade I diastolic  ?dysfunction (impaired relaxation).  ??2. Right ventricular systolic function is normal. The right ventricular  ?size is normal.  ??3. Left atrial size was moderately dilated.  ??4. The mitral valve is abnormal. Mild mitral valve regurgitation.  ?Moderate mitral annular calcification.  ??5. The aortic valve is tricuspid. There is mild calcification of the  ?aortic valve. There is mild thickening of the aortic valve. Aortic valve  ?regurgitation is mild. Aortic valve sclerosis/calcification is present,  ?without  any evidence of aortic  ?stenosis.  ??6. Aortic dilatation noted. There is borderline dilatation of the aortic  ?root, measuring 38 mm.  ? Reproductive/Obstetrics ?negative OB ROS ? ?  ? ? ? ? ? ? ? ? ? ? ? ? ? ?  ?  ? ? ? ? ? ? ? ? ?Anesthesia Physical ?Anesthesia Plan ? ?ASA: 4 and emergent ? ?Anesthesia Plan: General  ? ?Post-op Pain Management:   ? ?Induction:  ? ?PONV Risk Score and Plan: Propofol infusion ? ?Airway Management Planned:  ? ?Additional Equipment:  ? ?Intra-op Plan:  ? ?Post-operative Plan:  ? ?Informed Consent: I have reviewed the patients History and Physical, chart, labs and discussed the procedure including the risks, benefits and alternatives for the proposed anesthesia with the patient or authorized representative who has indicated his/her understanding and acceptance.  ? ? ? ?Dental Advisory Given ? ?Plan Discussed with: CRNA ? ?Anesthesia Plan Comments:   ? ? ? ? ? ? ?Anesthesia Quick Evaluation ? ?

## 2022-02-14 NOTE — Telephone Encounter (Signed)
-----   Message from [place], PA-C sent at 02/14/2022  3:10 PM EDT ----- ?Regarding: heart monitor ?Pt admitted for acute CVA. Needs 2 week heart monitor mailed. thanks ? ?

## 2022-02-14 NOTE — Consult Note (Addendum)
I connected with  Peter [name] on 02/14/22 by a video enabled telemedicine application and verified that I am speaking with the correct person using two identifiers.   I discussed the limitations of evaluation and management by telemedicine. The patient expressed understanding and agreed to proceed.  Location of patient: [facility] Location of physician: [facility]   Neurology Consultation Reason for Consult: Stroke Referring Physician: Dr. Roxan Hockey  CC: Stroke  History is obtained from: Patient, chart review  HPI: Peter [name] is a 81 y.o. male with past medical history of hypertension, hyperlipidemia, coronary artery disease status post CABG in 1997, Type 2 diabetes, CKD3 who was told to come to emergency room after MRI brain showed acute ischemic strokes.  Patient states he reported tinnitus and "hearing his heartbeat in his ears" and therefore his PCP ordered MRI brain. MRI brain with and without contrast was performed on 02/11/2022 that showed multiple punctate supratentorial acute infarcts bilaterally.  Therefore, patient was recommended to come to the emergency room for further evaluation.   Patient denies any acute neurological deficits.  States he has been taking aspirin and Brilinta but occasionally skips a dose every few days because of excessive bruising.  ROS: All other systems reviewed and negative except as noted in the HPI.   Past Medical History:  Diagnosis Date   AAA (abdominal aortic aneurysm)    CAD (coronary artery disease)    a. s/p CABG in 1997 b. stent to SVG-PDA and PTCA of LAD in 2003 c. cath in 2015 showing patent SVG-OM1-OM2, SVG-RI, SVG-D1, and LIMA-LAD with occluded SVG-PDA and severe stenosis of mid-LAD at LIMA insertion with DES placed d. 04/2018: similar results to 2015 with patent LAD stent; occlusion of small caliber D1 stenosis with medical management recommended.    Chronic back pain 07/25/2018   Chronic lumbar pain uses  hydrocodone intermittently   Chronic systolic CHF (congestive heart failure) (HCC)    COPD (chronic obstructive pulmonary disease) ([place])    Diabetes mellitus    diet controlled   Dyslipidemia    Hypertension    Myocardial infarction [facility]) 1997   stents x2 (2003/2007)    Family History  Problem Relation Age of Onset   Other Father        deceased after ?TCS or barium enema, age 10s   Hypertension Father    Heart attack Mother    Colon cancer Neg Hx    Liver disease Neg Hx     Social History:  reports that he quit smoking about 21 years ago. His smoking use included cigarettes. He has a 120.00 pack-year smoking history. He has never used smokeless tobacco. He reports that he does not drink alcohol and does not use drugs.   Medications Prior to Admission  Medication Sig Dispense Refill Last Dose   Aloe Vera Juice LIQD Take 8 oz by mouth daily.   Past Week   aspirin EC 81 MG tablet Take 1 tablet (81 mg total) by mouth daily. 90 tablet 3 Past Week   HYDROcodone-acetaminophen (NORCO) 10-325 MG tablet Take one tablet po BID prn pain . Caution Drowsiness 60 tablet 0 Past Week   losartan (COZAAR) 25 MG tablet Take 1 tablet (25 mg total) by mouth daily. 90 tablet 3 Past Week   metoprolol succinate (TOPROL-XL) 25 MG 24 hr tablet Take 1 tablet (25 mg total) by mouth daily. 90 tablet 3 Past Week at PM   rosuvastatin (CRESTOR) 40 MG tablet Take 1  tablet (40 mg total) by mouth daily. 90 tablet 1 Past Week   ticagrelor (BRILINTA) 60 MG TABS tablet Take 1 tablet (60 mg total) by mouth 2 (two) times daily. 60 tablet 11 Past Week   vitamin E 180 MG (400 UNITS) capsule Take 400 Units by mouth daily.   unknown   ACCU-CHEK GUIDE test strip USE 1 STRIP TO CHECK GLUCOSE TWICE DAILY DUE  TO  FLUCTUATING  SUGARS 100 each 0    HYDROcodone-acetaminophen (NORCO) 10-325 MG tablet Take one tablet po BID prn pain . Caution Drowsiness 60 tablet 0    HYDROcodone-acetaminophen (NORCO) 10-325 MG tablet Take one  tablet po BID prn pain . Caution Drowsiness 60 tablet 0    nitroGLYCERIN (NITROSTAT) 0.4 MG SL tablet Place 1 tablet (0.4 mg total) under the tongue every 5 (five) minutes as needed for chest pain. (Patient not taking: Reported on 02/12/2022) 30 tablet 3 Not Taking   predniSONE (DELTASONE) 50 MG tablet One tablet ('50mg'$ ) 13 hours prior to procedure; one tablet ('50mg'$ ) 7 hours prior to procedure and then one tablet (50 mg) one hour prior to procedure. (Patient not taking: Reported on 02/12/2022) 3 tablet 0 Completed Course     Exam: Current vital signs: BP (!) 122/54 (BP Location: Right Arm)    Pulse (!) 58    Temp 98.1 F (36.7 C) (Oral)    Resp 18    Ht 6' (1.829 m)    Wt 78 kg    SpO2 98%    BMI 23.33 kg/m  Vital signs in last 24 hours: Temp:  [98.1 F (36.7 C)-98.4 F (36.9 C)] 98.1 F (36.7 C) (03/13 0400) Pulse Rate:  [58-66] 58 (03/13 0400) Resp:  [18] 18 (03/13 0400) BP: (122-144)/(54-71) 122/54 (03/13 0400) SpO2:  [98 %] 98 % (03/13 0936) FiO2 (%):  [21 %] 21 % (03/13 0936)   Physical Exam  Constitutional: Appears well-developed and well-nourished.  Psych: Affect appropriate to situation Eyes: No scleral injection  Respiratory: Effort normal, non-labored breathing Neuro: AOx3, no evidence of aphasia, cranial nerves II to XII appear grossly intact, antigravity strength without drift in all 4 extremities, FTN intact bilaterally  I have reviewed labs in epic and the results pertinent to this consultation are: CBC:  Recent Labs  Lab 02/11/22 1748 02/12/22 0506 02/14/22 0419  WBC 8.2 8.8 11.2*  NEUTROABS 4.3  --   --   HGB 13.9 13.1 12.2*  HCT 41.4 39.9 36.4*  MCV 97.2 96.1 97.3  PLT 120* 124* 108*    Basic Metabolic Panel:  Lab Results  Component Value Date   NA 138 02/14/2022   K 3.9 02/14/2022   CO2 25 02/14/2022   GLUCOSE 122 (H) 02/14/2022   BUN 32 (H) 02/14/2022   CREATININE 1.60 (H) 02/14/2022   CALCIUM 8.7 (L) 02/14/2022   GFRNONAA 44 (L) 02/14/2022    GFRAA 58 (L) 11/17/2020   Lipid Panel:  Lab Results  Component Value Date   LDLCALC 52 02/12/2022   HgbA1c:  Lab Results  Component Value Date   HGBA1C 6.8 (H) 02/12/2022   Urine Drug Screen:     Component Value Date/Time   LABOPIA NONE DETECTED 02/11/2022 2001   COCAINSCRNUR NONE DETECTED 02/11/2022 2001   LABBENZ NONE DETECTED 02/11/2022 2001   AMPHETMU NONE DETECTED 02/11/2022 2001   THCU NONE DETECTED 02/11/2022 2001   LABBARB NONE DETECTED 02/11/2022 2001    Alcohol Level     Component Value Date/Time   ETH <10  02/11/2022 1747     I have reviewed the images obtained: MRI brain with and without contrast 02/11/2022:  1. Multiple punctate supratentorial acute infarcts bilaterally,detailed above. Given involvement of multiple vascular territories, consider an embolic etiology. No significant mass effect. 2. Abnormal signal within the left intradural vertebral artery on T2 imaging with apparent filling defect versus atherosclerotic plaque on postcontrast imaging. Recommend CTA or MRA to further evaluate. 3. No retrocochlear mass. 4. Cerebral atrophy, chronic microvascular ischemic disease and remote inferior left cerebellar infarct.  CTA head and neck with contrast 02/12/2022: 1. Progressive atherosclerosis in the head and neck. No large vessel occlusion. 2. 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 Prominent soft plaque in both carotid bulbs with plaque ulceration. 3. Moderate bilateral intracranial ICA stenoses. 4. Severe left V4 stenosis. 5. Aortic Atherosclerosis   TTE 02/12/2022: Left ventricular ejection fraction 50 to 55%, no thrombus, no PFO  ASSESSMENT/PLAN: 81 year old male who was noted to have incidental strokes on MRI brain, now admitted for further evaluation.  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 Donzetta Matters for bilateral symptomatic carotid stenosis in 1 to 2 weeks. -Goal blood pressure systolic 098-119 until any carotid intervention, avoid hypotension -Continue aspirin and Brilinta as well as high intensity rosuvastatin for secondary stroke prevention.   -Counseled patient regarding BEFAST -Follow-up with neurology in 4 to 6 weeks -Management of rest of comorbidities per primary team   Thank you for allowing [place] to participate in the care of this patient. If you have any further questions, please contact  me or neurohospitalist.   [facility]

## 2022-02-14 NOTE — Anesthesia Postprocedure Evaluation (Signed)
Anesthesia Post Note ? ?Patient: Peter [name] ? ?Procedure(s) Performed: TRANSESOPHAGEAL ECHOCARDIOGRAM (TEE) ? ?Patient location during evaluation: Phase II ?Anesthesia Type: General ?Level of consciousness: awake ?Pain management: pain level controlled ?Vital Signs Assessment: post-procedure vital signs reviewed and stable ?Respiratory status: spontaneous breathing and respiratory function stable ?Cardiovascular status: blood pressure returned to baseline and stable ?Postop Assessment: no headache and no apparent nausea or vomiting ?Anesthetic complications: no ?Comments: Late entry ? ? ?No notable events documented. ? ? ?Last Vitals:  ?Vitals:  ? 02/14/22 1147 02/14/22 1211  ?BP: (!) 153/69 (!) 152/74  ?Pulse: 64 63  ?Resp: 10 18  ?Temp: 36.7 ?C   ?SpO2: 100% 99%  ?  ?Last Pain:  ?Vitals:  ? 02/14/22 1147  ?TempSrc:   ?PainSc: 0-No pain  ? ? ?  ?  ?  ?  ?  ?  ? ?Louann Sjogren ? ? ? ? ?

## 2022-02-14 NOTE — Discharge Instructions (Signed)
1) Continue current aspirin and brilinta-which are both blood thinners as well as your cholesterol medicine Crestor ?--Please avoid skipping medications ?2)Follow up with your Vascular surgeon Dr. Donzetta Matters--   Dr Donzetta Matters for carotid artery stenosis, you already see Dr Donzetta Matters for Abdominal aortic aneurysm  ? ?3)The cardiology service will send you a heart monitor called a Zio patch --- to help monitor your heart and look for any irregular heartbeat like atrial fibrillation  ? ?4)Avoid ibuprofen/Advil/Aleve/Motrin/Goody Powders/Naproxen/BC powders/Meloxicam/Diclofenac/Indomethacin and other Nonsteroidal anti-inflammatory medications as these will make you more likely to bleed and can cause stomach ulcers, can also cause Kidney problems.  ? ?5) outpatient follow-up with neurologist in 4 to 6 weeks for stroke follow-up advised ?

## 2022-02-14 NOTE — Op Note (Addendum)
? ? ? ?  Transesophageal Echocardiogram Note ? ?Peter [name] ?[id number] ?11-04-1941 ? ?Procedure: Transesophageal Echocardiogram ?Indications: Stroke ? ?Procedure Details ?Consent: Obtained ?Time Out: Verified patient identification, verified procedure, site/side was marked, verified correct patient position, special equipment/implants available, Radiology Safety Procedures followed,  medications/allergies/relevent history reviewed, required imaging and test results available.  Performed ? ?Medications: ?Propofol '80mg'$  ?Etomidate '4mg'$  ? ?Left Ventrical:  LVEF 50% ? ?Mitral Valve: Mild MR ? ?Aortic Valve: Mild AI ? ?Tricuspid Valve: Trivial TR ? ?Pulmonic Valve: Mild PI ? ?Left Atrium/ Left atrial appendage: No evidence of LAA thrombus ? ?Atrial septum: PFO present by color doppler and agitated saline study ? ?Aorta: Grade III- IV plaque ? ? ?Complications: No apparent complications ?Patient did tolerate procedure well. ? ?Freada Bergeron, MD ?02/14/2022, 12:39 PM  ?

## 2022-02-14 NOTE — Evaluation (Signed)
Occupational Therapy Evaluation ?Patient Details ?Name: Peter [name] ?MRN: [id number] ?DOB: September 21, 1941 ?Today's Date: 02/14/2022 ? ? ?History of Present Illness Peter [name] is a 81 y.o. male with medical history significant of hypertension, hyperlipidemia, CAD s/p CABG (1997), tinnitus, T2DM, CKD 3A who presents to the emergency department after being brought to the ED by an MRI tech.  Patient complained of recent worsening symptoms of tinnitus, so he went to his PCP with complaint of being unable to rest and hearing of his heartbeat in his ears, he was asked to do an outpatient MRI, after the MRI was done, the radiology tech was concerned about the findings  ? ?Clinical Impression ?  ?Pt agreeable to OT evaluation, demonstrates independence in mobility and ADL completion. No vision changes. Pt is at baseline for ADL completion, no further OT services required at this time.   ?   ? ?Recommendations for follow up therapy are one component of a multi-disciplinary discharge planning process, led by the attending physician.  Recommendations may be updated based on patient status, additional functional criteria and insurance authorization.  ? ?Follow Up Recommendations ? No OT follow up  ?  ?Assistance Recommended at Discharge None  ?   ?Functional Status Assessment ? Patient has not had a recent decline in their functional status  ?Equipment Recommendations ? None recommended by OT  ?  ?Recommendations for Other Services   ? ? ?  ?Precautions / Restrictions Precautions ?Precautions: None ?Restrictions ?Weight Bearing Restrictions: No  ? ?  ? ?Mobility Bed Mobility ?Overal bed mobility: Independent ?  ?  ?  ?  ?  ?  ?  ?  ? ?Transfers ?Overall transfer level: Independent ?Equipment used: None ?  ?  ?  ?  ?  ?  ?  ?  ?  ? ?  ?   ? ?ADL either performed or assessed with clinical judgement  ? ?ADL Overall ADL's : Independent ?  ?  ?  ?  ?  ?  ?  ?  ?  ?  ?  ?  ?  ?  ?  ?  ?  ?  ?  ?General ADL Comments: independent in  ADLs  ? ? ? ?Vision Baseline Vision/History: 0 No visual deficits ?Ability to See in Adequate Light: 0 Adequate ?Patient Visual Report: No change from baseline ?Vision Assessment?: No apparent visual deficits  ?   ?   ?   ? ?Pertinent Vitals/Pain Pain Assessment ?Pain Assessment: No/denies pain  ? ? ? ?Hand Dominance Right ?  ?Extremity/Trunk Assessment Upper Extremity Assessment ?Upper Extremity Assessment: Overall WFL for tasks assessed ?  ?Lower Extremity Assessment ?Lower Extremity Assessment: Defer to PT evaluation ?  ?Cervical / Trunk Assessment ?Cervical / Trunk Assessment: Normal ?  ?Communication Communication ?Communication: No difficulties ?  ?Cognition Arousal/Alertness: Awake/alert ?Behavior During Therapy: [facility] for tasks assessed/performed ?Overall Cognitive Status: Within Functional Limits for tasks assessed ?  ?  ?  ?  ?  ?  ?  ?  ?  ?  ?  ?  ?  ?  ?  ?  ?  ?  ?  ?   ?   ?   ? ? ?Home Living Family/patient expects to be discharged to:: Private residence ?Living Arrangements: Spouse/significant other ?Available Help at Discharge: Family;Available PRN/intermittently ?Type of Home: House ?Home Access: Ramped entrance ?  ?  ?Home Layout: One level ?  ?  ?Bathroom Shower/Tub: Tub/shower unit;Walk-in shower ?  ?Bathroom  Toilet: Standard ?  ?  ?Home Equipment: Conservation officer, nature (2 wheels);Cane - single point;Shower seat;Grab bars - tub/shower;Other (comment) (walking stick) ?  ?Additional Comments: Wife has wheelchair, pt helps care for wife ?  ? ?  ?Prior Functioning/Environment Prior Level of Function : Independent/Modified Independent;Driving ?  ?  ?  ?  ?  ?  ?Mobility Comments: Occasionally uses walking stick ?ADLs Comments: Independent with ADLs ?  ? ?  ?  ?OT Problem List: Decreased activity tolerance ?  ?   ? ?AM-PAC OT "6 Clicks" Daily Activity     ?Outcome Measure Help from another person eating meals?: None ?Help from another person taking care of personal grooming?: None ?Help from another person  toileting, which includes using toliet, bedpan, or urinal?: None ?Help from another person bathing (including washing, rinsing, drying)?: None ?Help from another person to put on and taking off regular upper body clothing?: None ?Help from another person to put on and taking off regular lower body clothing?: None ?6 Click Score: 24 ?  ?End of Session   ? ?Activity Tolerance: Patient tolerated treatment well ?Patient left: in bed;with call bell/phone within reach ? ?OT Visit Diagnosis: Muscle weakness (generalized) (M62.81)  ?              ?Time: 9811-9147 ?OT Time Calculation (min): 11 min ?Charges:  OT General Charges ?$OT Visit: 1 Visit ?OT Evaluation ?$OT Eval Low Complexity: 1 Low ? ?Guadelupe Sabin, OTR/L  ?[phone] ?02/14/2022, 8:33 AM ?

## 2022-02-14 NOTE — Transfer of Care (Signed)
Immediate Anesthesia Transfer of Care Note ? ?Patient: Peter [name] ? ?Procedure(s) Performed: TRANSESOPHAGEAL ECHOCARDIOGRAM (TEE) ? ?Patient Location: PACU ? ?Anesthesia Type:General ? ?Level of Consciousness: awake and alert  ? ?Airway & Oxygen Therapy: Patient Spontanous Breathing and Patient connected to nasal cannula oxygen ? ?Post-op Assessment: Report given to RN and Post -op Vital signs reviewed and stable ? ?Post vital signs: Reviewed and stable ? ?Last Vitals:  ?Vitals Value Taken Time  ?BP 141/70   ?Temp    ?Pulse 74   ?Resp 16   ?SpO2 100%   ? ? ?Last Pain:  ?Vitals:  ? 02/14/22 1120  ?TempSrc: Oral  ?PainSc: 0-No pain  ?   ? ?  ? ?Complications: No notable events documented. ?

## 2022-02-14 NOTE — Interval H&P Note (Signed)
History and Physical Interval Note: ? ?02/14/2022 ?1:02 PM ? ?Peter [name]  has presented today for surgery, with the diagnosis of stroke.  The various methods of treatment have been discussed with the patient and family. After consideration of risks, benefits and other options for treatment, the patient has consented to  Procedure(s): ?TRANSESOPHAGEAL ECHOCARDIOGRAM (TEE) (N/A) as a surgical intervention.  The patient's history has been reviewed, patient examined, no change in status, stable for surgery.  I have reviewed the patient's chart and labs.  Questions were answered to the patient's satisfaction.   ? ? ?Freada Bergeron ? ? ?

## 2022-02-14 NOTE — Progress Notes (Signed)
*  PRELIMINARY RESULTS* ?Echocardiogram ?2D Echocardiogram has been performed. ? ?Peter [name] ?02/14/2022, 12:32 PM ?

## 2022-02-16 ENCOUNTER — Encounter (HOSPITAL_COMMUNITY): Payer: Self-pay | Admitting: Cardiology

## 2022-02-17 ENCOUNTER — Other Ambulatory Visit: Payer: Self-pay | Admitting: *Deleted

## 2022-02-17 DIAGNOSIS — I6529 Occlusion and stenosis of unspecified carotid artery: Secondary | ICD-10-CM

## 2022-02-20 DIAGNOSIS — I6389 Other cerebral infarction: Secondary | ICD-10-CM | POA: Diagnosis not present

## 2022-02-23 ENCOUNTER — Other Ambulatory Visit: Payer: Self-pay

## 2022-02-23 ENCOUNTER — Ambulatory Visit (INDEPENDENT_AMBULATORY_CARE_PROVIDER_SITE_OTHER): Payer: Medicare Other | Admitting: Family Medicine

## 2022-02-23 VITALS — BP 128/68 | HR 65 | Temp 97.9°F | Ht 72.0 in | Wt 176.0 lb

## 2022-02-23 DIAGNOSIS — I5022 Chronic systolic (congestive) heart failure: Secondary | ICD-10-CM | POA: Diagnosis not present

## 2022-02-23 DIAGNOSIS — J449 Chronic obstructive pulmonary disease, unspecified: Secondary | ICD-10-CM | POA: Diagnosis not present

## 2022-02-23 DIAGNOSIS — I6523 Occlusion and stenosis of bilateral carotid arteries: Secondary | ICD-10-CM | POA: Diagnosis not present

## 2022-02-23 DIAGNOSIS — I10 Essential (primary) hypertension: Secondary | ICD-10-CM

## 2022-02-23 NOTE — Progress Notes (Signed)
? ?  Subjective:  ? ? Patient ID: Peter [name], male    DOB: [birth year], 81 y.o.   MRN: [id number] ? ?HPI ?Hospitalization follow up CVA, Stroke  ?Patient had MRI because he was having vertigo issues as well as tinnitus pulsatile ?It did not show any aneurysm but it did show multiple small strokes ended up being admitted into the hospital.  We went through numerous tests we reviewed over those including the transesophageal echo as well as MRI CTA.  We also reviewed over his A1c lipid profile kidney function liver function.  He is taking his medicines as directed.  Trying to stay physically active trying to stay healthy.  Patient is stressed to some degree because he cares for his disabled wife who has dementia. ? ?Review of Systems ? ?   ?Objective:  ? Physical Exam ?General-in no acute distress ?Eyes-no discharge ?Lungs-respiratory rate normal, CTA ?CV-no murmurs,RRR ?Extremities skin warm dry no edema ?Neuro grossly normal ?Behavior normal, alert ? ? ? ? ?   ?Assessment & Plan:  ?1. HTN (hypertension), benign ?Blood pressure decent control continue current measures ? ?2. Bilateral carotid artery stenosis ?Continue antiplatelet therapy.  See vascular surgery going to be evaluated for carotid artery surgery ?Also has a infrarenal aneurysm that is being evaluated for surgery but patient uncertain if he will go through the surgery ? ?3. Chronic systolic heart failure ([place]) ?History of heart failure currently under decent control ? ?4. Chronic obstructive pulmonary disease, unspecified COPD type ([place]) ?COPD under decent control patient with a history of smoking no longer smokes ? ?Recent strokes recovering. ? ?If patient has aortic aneurysm repair more than likely will be in the hospital for a week and 1/2+ also facing 4 to 8 weeks of recovery therefore his wife would more than likely have to be placed into a rest home he is uncertain if he is going to go ahead with the surgery ? ?He does state that he would be open to  doing the carotid surgery.  Sees vascular next week ? ? ?

## 2022-03-02 ENCOUNTER — Ambulatory Visit (HOSPITAL_COMMUNITY)
Admission: RE | Admit: 2022-03-02 | Discharge: 2022-03-02 | Disposition: A | Discharge status: Home / Self Care | Payer: Medicare Other | Source: Ambulatory Visit | Attending: Vascular Surgery | Admitting: Vascular Surgery

## 2022-03-02 ENCOUNTER — Ambulatory Visit: Payer: Medicare Other | Admitting: Vascular Surgery

## 2022-03-02 ENCOUNTER — Other Ambulatory Visit: Payer: Self-pay

## 2022-03-02 ENCOUNTER — Encounter: Payer: Self-pay | Admitting: Vascular Surgery

## 2022-03-02 VITALS — BP 159/87 | HR 60 | Temp 98.1°F | Resp 20 | Ht 72.0 in | Wt 175.7 lb

## 2022-03-02 DIAGNOSIS — I6529 Occlusion and stenosis of unspecified carotid artery: Secondary | ICD-10-CM

## 2022-03-02 DIAGNOSIS — I7132 Juxtarenal abdominal aortic aneurysm, ruptured: Secondary | ICD-10-CM

## 2022-03-02 NOTE — Progress Notes (Signed)
? ?Patient ID: Peter [name], male   DOB: 1941-03-06, 81 y.o.   MRN: [id number] ? ?Reason for Consult: Follow-up ?  ?Referred by Kathyrn Drown, MD ? ?Subjective:  ?   ?HPI: ? ?Peter [name] is a 81 y.o. male followed here for abdominal aortic aneurysm.  He was recently evaluated for tinnitus found to have possible previous bilateral punctate strokes.  He was found to have high-grade carotid stenosis.  He does not know of any previous strokes that he has had he has had pain in the left neck but no previous episodes of stroke, TIA or amaurosis.  He continues on aspirin, statin and Brilinta.  He does not have any new back or abdominal pain.  He presents alone today as his son was having eye surgery.  He is the primary caregiver of his wife who has dementia. ? ?Past Medical History:  ?Diagnosis Date  ? AAA (abdominal aortic aneurysm)   ? CAD (coronary artery disease)   ? a. s/p CABG in 1997 b. stent to SVG-PDA and PTCA of LAD in 2003 c. cath in 2015 showing patent SVG-OM1-OM2, SVG-RI, SVG-D1, and LIMA-LAD with occluded SVG-PDA and severe stenosis of mid-LAD at LIMA insertion with DES placed d. 04/2018: similar results to 2015 with patent LAD stent; occlusion of small caliber D1 stenosis with medical management recommended.   ? Chronic back pain 07/25/2018  ? Chronic lumbar pain uses hydrocodone intermittently  ? Chronic systolic CHF (congestive heart failure) ([place])   ? COPD (chronic obstructive pulmonary disease) ([place])   ? Diabetes mellitus   ? diet controlled  ? Dyslipidemia   ? Hypertension   ? Myocardial infarction [facility]) 1997  ? stents x2 (2003/2007)  ? ?Family History  ?Problem Relation Age of Onset  ? Other Father   ?     deceased after ?TCS or barium enema, age 69s  ? Hypertension Father   ? Heart attack Mother   ? Colon cancer Neg Hx   ? Liver disease Neg Hx   ? ?Past Surgical History:  ?Procedure Laterality Date  ? CORONARY ANGIOPLASTY  2003/2007  ? 2 stents  ? CORONARY ANGIOPLASTY WITH STENT PLACEMENT   08/14/14  ? DES to LIMA-LAD insertion  ? CORONARY ARTERY BYPASS GRAFT  1997  ? 7 vessels  ? CORONARY STENT INTERVENTION N/A 02/24/2020  ? Procedure: CORONARY STENT INTERVENTION;  Surgeon: Sherren Mocha, MD;  Location: [facility];  Service: Cardiovascular;  Laterality: N/A;  ? CORONARY STENT PLACEMENT  08/15/2014  ? MID LAD  DES  by Dr Burt Knack  ? CORONARY/GRAFT ANGIOGRAPHY N/A 02/24/2020  ? Procedure: CORONARY/GRAFT ANGIOGRAPHY;  Surgeon: Sherren Mocha, MD;  Location: [facility];  Service: Cardiovascular;  Laterality: N/A;  ? CYSTECTOMY  2005  ? top of head-[place]  ? EAR CYST EXCISION  07/09/2012  ? Procedure: CYST REMOVAL;  Surgeon: Jamesetta So, MD;  Location: [facility];  Service: General;  Laterality: N/A;  ? LEFT HEART CATH AND CORS/GRAFTS ANGIOGRAPHY N/A 04/16/2018  ? Procedure: LEFT HEART CATH AND CORS/GRAFTS ANGIOGRAPHY;  Surgeon: Martinique, Peter M, MD;  Location: [facility];  Service: Cardiovascular;  Laterality: N/A;  ? LEFT HEART CATHETERIZATION WITH CORONARY/GRAFT ANGIOGRAM N/A 08/14/2014  ? Procedure: LEFT HEART CATHETERIZATION WITH Beatrix Fetters;  Surgeon: Blane Ohara, MD;  Location: [facility];  Service: Cardiovascular;  Laterality: N/A;  ? TEE WITHOUT CARDIOVERSION N/A 02/14/2022  ? Procedure: TRANSESOPHAGEAL ECHOCARDIOGRAM (TEE);  Surgeon: Freada Bergeron, MD;  Location: [facility];  Service: Cardiovascular;  Laterality: N/A;  ? ? ?Short Social History:  ?Social History  ? ?Tobacco Use  ? Smoking status: Former  ?  Packs/day: 3.00  ?  Years: 40.00  ?  Pack years: 120.00  ?  Types: Cigarettes  ?  Quit date: 07/02/2000  ?  Years since quitting: 21.6  ? Smokeless tobacco: Never  ?Substance Use Topics  ? Alcohol use: No  ?  Alcohol/week: 10.0 standard drinks  ?  Types: 10 Cans of beer per week  ?  Comment: quit 3 months (01/2013). 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  ? ? ?Allergies  ?Allergen Reactions  ? Dye Fdc Red [Red Dye] Hives  ?  hives  ?  Ivp Dye [Iodinated Contrast Media] Hives  ? Plavix [Clopidogrel] Hives  ?  Unsure if dye or Plavix  ? Keflex [Cephalexin] Other (See Comments)  ?  hoarsness  ? ? ?Current Outpatient Medications  ?Medication Sig Dispense Refill  ? ACCU-CHEK GUIDE test strip USE 1 STRIP TO CHECK GLUCOSE TWICE DAILY DUE  TO  FLUCTUATING  SUGARS 100 each 0  ? Aloe Vera Juice LIQD Take 8 oz by mouth daily.    ? aspirin EC 81 MG tablet Take 1 tablet (81 mg total) by mouth daily with breakfast. 90 tablet 3  ? HYDROcodone-acetaminophen (NORCO) 10-325 MG tablet Take one tablet po BID prn pain . Caution Drowsiness 60 tablet 0  ? losartan (COZAAR) 25 MG tablet Take 1 tablet (25 mg total) by mouth daily. 90 tablet 3  ? metoprolol succinate (TOPROL-XL) 25 MG 24 hr tablet Take 1 tablet (25 mg total) by mouth daily. 90 tablet 3  ? rosuvastatin (CRESTOR) 40 MG tablet Take 1 tablet (40 mg total) by mouth daily. 90 tablet 3  ? ticagrelor (BRILINTA) 60 MG TABS tablet Take 1 tablet (60 mg total) by mouth 2 (two) times daily. 60 tablet 11  ? vitamin E 180 MG (400 UNITS) capsule Take 400 Units by mouth daily.    ? ?No current facility-administered medications for this visit.  ? ? ?Review of Systems  ?Constitutional:  Constitutional negative. ?HENT: HENT negative.  ?Eyes: Eyes negative.  ?Respiratory: Respiratory negative.  ?Cardiovascular: Cardiovascular negative.  ?GI: Gastrointestinal negative.  ?Skin: Skin negative.  ?Neurological: Neurological negative. ?Hematologic: Hematologic/lymphatic negative.  ?Psychiatric: Psychiatric negative.   ? ?   ?Objective:  ?Objective  ? ?Vitals:  ? 03/02/22 1131  ?BP: (!) 159/87  ?Pulse: 60  ?Resp: 20  ?Temp: 98.1 ?F (36.7 ?C)  ?SpO2: (!) 59%  ?Weight: 175 lb 11.2 oz (79.7 kg)  ?Height: 6' (1.829 m)  ? ?Body mass index is 23.83 kg/m?. ? ?Physical Exam ?HENT:  ?   Head: Normocephalic.  ?   Nose: Nose normal.  ?   Mouth/Throat:  ?   Mouth: Mucous membranes are moist.  ?Eyes:  ?   Pupils: Pupils are equal, round, and  reactive to light.  ?Neck:  ?   Vascular: Carotid bruit present.  ?Cardiovascular:  ?   Pulses:     ?     Radial pulses are 2+ on the right side and 2+ on the left side.  ?Pulmonary:  ?   Effort: Pulmonary effort is normal.  ?Abdominal:  ?   General: Abdomen is flat.  ?   Palpations: Abdomen is soft. There is no mass.  ?Musculoskeletal:     ?   General: Normal range of motion.  ?  Right lower leg: No edema.  ?   Left lower leg: No edema.  ?Skin: ?   Capillary Refill: Capillary refill takes less than 2 seconds.  ?Neurological:  ?   General: No focal deficit present.  ?   Mental Status: He is alert.  ?Psychiatric:     ?   Mood and Affect: Mood normal.  ? ? ?Data: ?Right Carotid Findings:  ?+----------+--------+--------+--------+------------------+--------+  ?          PSV cm/sEDV cm/sStenosisPlaque DescriptionComments  ?+----------+--------+--------+--------+------------------+--------+  ?CCA Prox  79      9                                           ?+----------+--------+--------+--------+------------------+--------+  ?CCA Mid   89      17              homogeneous                 ?+----------+--------+--------+--------+------------------+--------+  ?CCA Distal49      13              heterogenous                ?+----------+--------+--------+--------+------------------+--------+  ?ICA Prox  408     145     80-99%  heterogenous                ?+----------+--------+--------+--------+------------------+--------+  ?ICA Mid   115     26                                          ?+----------+--------+--------+--------+------------------+--------+  ?ICA Distal59      19                                          ?+----------+--------+--------+--------+------------------+--------+  ?ECA       113                                                 ?+----------+--------+--------+--------+------------------+--------+   ? ?+----------+--------+-------+----------------+-------------------+  ?          PSV cm/sEDV cmsDescribe        Arm Pressure (mmHG)  ?+----------+--------+-------+----------------+-------------------+  ?[phone]            Multiphasic, WNL                     ?+----------+--------+-------+----------------+-------------

## 2022-03-02 NOTE — H&P (View-Only) (Signed)
? ?Patient ID: Peter [name], male   DOB: Oct 24, 1941, 81 y.o.   MRN: [id number] ? ?Reason for Consult: Follow-up ?  ?Referred by Kathyrn Drown, MD ? ?Subjective:  ?   ?HPI: ? ?Peter [name] is a 81 y.o. male followed here for abdominal aortic aneurysm.  He was recently evaluated for tinnitus found to have possible previous bilateral punctate strokes.  He was found to have high-grade carotid stenosis.  He does not know of any previous strokes that he has had he has had pain in the left neck but no previous episodes of stroke, TIA or amaurosis.  He continues on aspirin, statin and Brilinta.  He does not have any new back or abdominal pain.  He presents alone today as his son was having eye surgery.  He is the primary caregiver of his wife who has dementia. ? ?Past Medical History:  ?Diagnosis Date  ? AAA (abdominal aortic aneurysm)   ? CAD (coronary artery disease)   ? a. s/p CABG in 1997 b. stent to SVG-PDA and PTCA of LAD in 2003 c. cath in 2015 showing patent SVG-OM1-OM2, SVG-RI, SVG-D1, and LIMA-LAD with occluded SVG-PDA and severe stenosis of mid-LAD at LIMA insertion with DES placed d. 04/2018: similar results to 2015 with patent LAD stent; occlusion of small caliber D1 stenosis with medical management recommended.   ? Chronic back pain 07/25/2018  ? Chronic lumbar pain uses hydrocodone intermittently  ? Chronic systolic CHF (congestive heart failure) ([place])   ? COPD (chronic obstructive pulmonary disease) ([place])   ? Diabetes mellitus   ? diet controlled  ? Dyslipidemia   ? Hypertension   ? Myocardial infarction [facility]) 1997  ? stents x2 (2003/2007)  ? ?Family History  ?Problem Relation Age of Onset  ? Other Father   ?     deceased after ?TCS or barium enema, age 63s  ? Hypertension Father   ? Heart attack Mother   ? Colon cancer Neg Hx   ? Liver disease Neg Hx   ? ?Past Surgical History:  ?Procedure Laterality Date  ? CORONARY ANGIOPLASTY  2003/2007  ? 2 stents  ? CORONARY ANGIOPLASTY WITH STENT PLACEMENT   08/14/14  ? DES to LIMA-LAD insertion  ? CORONARY ARTERY BYPASS GRAFT  1997  ? 7 vessels  ? CORONARY STENT INTERVENTION N/A 02/24/2020  ? Procedure: CORONARY STENT INTERVENTION;  Surgeon: Sherren Mocha, MD;  Location: [place] [facility];  Service: Cardiovascular;  Laterality: N/A;  ? CORONARY STENT PLACEMENT  08/15/2014  ? MID LAD  DES  by Dr Burt Knack  ? CORONARY/GRAFT ANGIOGRAPHY N/A 02/24/2020  ? Procedure: CORONARY/GRAFT ANGIOGRAPHY;  Surgeon: Sherren Mocha, MD;  Location: [place] [facility];  Service: Cardiovascular;  Laterality: N/A;  ? CYSTECTOMY  2005  ? top of head-[place]  ? EAR CYST EXCISION  07/09/2012  ? Procedure: CYST REMOVAL;  Surgeon: Jamesetta So, MD;  Location: [facility];  Service: General;  Laterality: N/A;  ? LEFT HEART CATH AND CORS/GRAFTS ANGIOGRAPHY N/A 04/16/2018  ? Procedure: LEFT HEART CATH AND CORS/GRAFTS ANGIOGRAPHY;  Surgeon: Martinique, Peter M, MD;  Location: [place] [facility];  Service: Cardiovascular;  Laterality: N/A;  ? LEFT HEART CATHETERIZATION WITH CORONARY/GRAFT ANGIOGRAM N/A 08/14/2014  ? Procedure: LEFT HEART CATHETERIZATION WITH Beatrix Fetters;  Surgeon: Blane Ohara, MD;  Location: [facility] CATH LAB;  Service: Cardiovascular;  Laterality: N/A;  ? TEE WITHOUT CARDIOVERSION N/A 02/14/2022  ? Procedure: TRANSESOPHAGEAL ECHOCARDIOGRAM (TEE);  Surgeon: Freada Bergeron, MD;  Location: [facility];  Service: Cardiovascular;  Laterality: N/A;  ? ? ?Short Social History:  ?Social History  ? ?Tobacco Use  ? Smoking status: Former  ?  Packs/day: 3.00  ?  Years: 40.00  ?  Pack years: 120.00  ?  Types: Cigarettes  ?  Quit date: 07/02/2000  ?  Years since quitting: 21.6  ? Smokeless tobacco: Never  ?Substance Use Topics  ? Alcohol use: No  ?  Alcohol/week: 10.0 standard drinks  ?  Types: 10 Cans of beer per week  ?  Comment: quit 3 months (01/2013). 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  ? ? ?Allergies  ?Allergen Reactions  ? Dye Fdc Red [Red Dye] Hives  ?  hives  ?  Ivp Dye [Iodinated Contrast Media] Hives  ? Plavix [Clopidogrel] Hives  ?  Unsure if dye or Plavix  ? Keflex [Cephalexin] Other (See Comments)  ?  hoarsness  ? ? ?Current Outpatient Medications  ?Medication Sig Dispense Refill  ? ACCU-CHEK GUIDE test strip USE 1 STRIP TO CHECK GLUCOSE TWICE DAILY DUE  TO  FLUCTUATING  SUGARS 100 each 0  ? Aloe Vera Juice LIQD Take 8 oz by mouth daily.    ? aspirin EC 81 MG tablet Take 1 tablet (81 mg total) by mouth daily with breakfast. 90 tablet 3  ? HYDROcodone-acetaminophen (NORCO) 10-325 MG tablet Take one tablet po BID prn pain . Caution Drowsiness 60 tablet 0  ? losartan (COZAAR) 25 MG tablet Take 1 tablet (25 mg total) by mouth daily. 90 tablet 3  ? metoprolol succinate (TOPROL-XL) 25 MG 24 hr tablet Take 1 tablet (25 mg total) by mouth daily. 90 tablet 3  ? rosuvastatin (CRESTOR) 40 MG tablet Take 1 tablet (40 mg total) by mouth daily. 90 tablet 3  ? ticagrelor (BRILINTA) 60 MG TABS tablet Take 1 tablet (60 mg total) by mouth 2 (two) times daily. 60 tablet 11  ? vitamin E 180 MG (400 UNITS) capsule Take 400 Units by mouth daily.    ? ?No current facility-administered medications for this visit.  ? ? ?Review of Systems  ?Constitutional:  Constitutional negative. ?HENT: HENT negative.  ?Eyes: Eyes negative.  ?Respiratory: Respiratory negative.  ?Cardiovascular: Cardiovascular negative.  ?GI: Gastrointestinal negative.  ?Skin: Skin negative.  ?Neurological: Neurological negative. ?Hematologic: Hematologic/lymphatic negative.  ?Psychiatric: Psychiatric negative.   ? ?   ?Objective:  ?Objective  ? ?Vitals:  ? 03/02/22 1131  ?BP: (!) 159/87  ?Pulse: 60  ?Resp: 20  ?Temp: 98.1 ?F (36.7 ?C)  ?SpO2: (!) 59%  ?Weight: 175 lb 11.2 oz (79.7 kg)  ?Height: 6' (1.829 m)  ? ?Body mass index is 23.83 kg/m?. ? ?Physical Exam ?HENT:  ?   Head: Normocephalic.  ?   Nose: Nose normal.  ?   Mouth/Throat:  ?   Mouth: Mucous membranes are moist.  ?Eyes:  ?   Pupils: Pupils are equal, round, and  reactive to light.  ?Neck:  ?   Vascular: Carotid bruit present.  ?Cardiovascular:  ?   Pulses:     ?     Radial pulses are 2+ on the right side and 2+ on the left side.  ?Pulmonary:  ?   Effort: Pulmonary effort is normal.  ?Abdominal:  ?   General: Abdomen is flat.  ?   Palpations: Abdomen is soft. There is no mass.  ?Musculoskeletal:     ?   General: Normal range of motion.  ?  Right lower leg: No edema.  ?   Left lower leg: No edema.  ?Skin: ?   Capillary Refill: Capillary refill takes less than 2 seconds.  ?Neurological:  ?   General: No focal deficit present.  ?   Mental Status: He is alert.  ?Psychiatric:     ?   Mood and Affect: Mood normal.  ? ? ?Data: ?Right Carotid Findings:  ?+----------+--------+--------+--------+------------------+--------+  ?          PSV cm/sEDV cm/sStenosisPlaque DescriptionComments  ?+----------+--------+--------+--------+------------------+--------+  ?CCA Prox  79      9                                           ?+----------+--------+--------+--------+------------------+--------+  ?CCA Mid   89      17              homogeneous                 ?+----------+--------+--------+--------+------------------+--------+  ?CCA Distal49      13              heterogenous                ?+----------+--------+--------+--------+------------------+--------+  ?ICA Prox  408     145     80-99%  heterogenous                ?+----------+--------+--------+--------+------------------+--------+  ?ICA Mid   115     26                                          ?+----------+--------+--------+--------+------------------+--------+  ?ICA Distal59      19                                          ?+----------+--------+--------+--------+------------------+--------+  ?ECA       113                                                 ?+----------+--------+--------+--------+------------------+--------+   ? ?+----------+--------+-------+----------------+-------------------+  ?          PSV cm/sEDV cmsDescribe        Arm Pressure (mmHG)  ?+----------+--------+-------+----------------+-------------------+  ?[phone]            Multiphasic, WNL                     ?+----------+--------+-------+----------------+-------------

## 2022-03-07 ENCOUNTER — Other Ambulatory Visit: Payer: Self-pay

## 2022-03-07 DIAGNOSIS — I6529 Occlusion and stenosis of unspecified carotid artery: Secondary | ICD-10-CM

## 2022-03-08 ENCOUNTER — Other Ambulatory Visit: Payer: Self-pay | Admitting: Family Medicine

## 2022-03-15 DIAGNOSIS — I4891 Unspecified atrial fibrillation: Secondary | ICD-10-CM | POA: Diagnosis not present

## 2022-03-15 DIAGNOSIS — I499 Cardiac arrhythmia, unspecified: Secondary | ICD-10-CM | POA: Diagnosis not present

## 2022-03-15 DIAGNOSIS — I639 Cerebral infarction, unspecified: Secondary | ICD-10-CM | POA: Diagnosis not present

## 2022-03-17 NOTE — Pre-Procedure Instructions (Signed)
Surgical Instructions ? ? ? Your procedure is scheduled on 03/25/22. ? Report to [facility] Main Entrance "A" at 10:40 A.M., then check in with the Admitting office. ? Call this number if you have problems the morning of surgery: ? [phone] ? ? If you have any questions prior to your surgery date call [phone]: Open Monday-Friday 8am-4pm ? ? ? Remember: ? Do not eat or drink after midnight the night before your surgery ?  ? Take these medicines the morning of surgery with A SIP OF WATER:  ?metoprolol succinate (TOPROL-XL), rosuvastatin (CRESTOR) ? ?Please follow your surgeon's instructions for aspirin EC 81 MG and ticagrelor (BRILINTA). If you have not received instructions, please contact your surgeon's office for blood thinner instructions. ? ?As of today, STOP taking any Aspirin (unless otherwise instructed by your surgeon) Aleve, Naproxen, Ibuprofen, Motrin, Advil, Goody's, BC's, all herbal medications, fish oil, and all vitamins. ? ?WHAT DO I DO ABOUT MY DIABETES MEDICATION? ?Do not take oral diabetes medicines (pills) the morning of surgery. ? ?HOW TO MANAGE YOUR DIABETES ?BEFORE AND AFTER SURGERY ? ?Why is it important to control my blood sugar before and after surgery? ?Improving blood sugar levels before and after surgery helps healing and can limit problems. ?A way of improving blood sugar control is eating a healthy diet by: ? Eating less sugar and carbohydrates ? Increasing activity/exercise ? Talking with your doctor about reaching your blood sugar goals ?High blood sugars (greater than 180 mg/dL) can raise your risk of infections and slow your recovery, so you will need to focus on controlling your diabetes during the weeks before surgery. ?Make sure that the doctor who takes care of your diabetes knows about your planned surgery including the date and location. ? ?How do I manage my blood sugar before surgery? ?Check your blood sugar at least 4 times a day, starting 2 days before surgery, to  make sure that the level is not too high or low. ? ?Check your blood sugar the morning of your surgery when you wake up and every 2 hours until you get to the Short Stay unit. ? ?If your blood sugar is less than 70 mg/dL, you will need to treat for low blood sugar: ?Do not take insulin. ?Treat a low blood sugar (less than 70 mg/dL) with ? cup of clear juice (cranberry or apple), 4 glucose tablets, OR glucose gel. ?Recheck blood sugar in 15 minutes after treatment (to make sure it is greater than 70 mg/dL). If your blood sugar is not greater than 70 mg/dL on recheck, call [phone] for further instructions. ?Report your blood sugar to the short stay nurse when you get to Short Stay. ? ?If you are admitted to the hospital after surgery: ?Your blood sugar will be checked by the staff and you will probably be given insulin after surgery (instead of oral diabetes medicines) to make sure you have good blood sugar levels. ?The goal for blood sugar control after surgery is 80-180 mg/dL. to ?         ?Do not wear jewelry or makeup ?Do not wear lotions, powders, perfumes/colognes, or deodorant. ?Do not shave 48 hours prior to surgery.  Men may shave face and neck. ?Do not bring valuables to the hospital. ?Do not wear nail polish, gel polish, artificial nails, or any other type of covering on natural nails (fingers and toes) ?If you have artificial nails or gel coating that need to be removed by a nail salon, please have this  removed prior to surgery. Artificial nails or gel coating may interfere with anesthesia's ability to adequately monitor your vital signs. ? ?[facility] is not responsible for any belongings or valuables. .  ? ?Do NOT Smoke (Tobacco/Vaping)  24 hours prior to your procedure ? ?If you use a CPAP at night, you may bring your mask for your overnight stay. ?  ?Contacts, glasses, hearing aids, dentures or partials may not be worn into surgery, please bring cases for these belongings ?  ?For patients  admitted to the hospital, discharge time will be determined by your treatment team. ?  ?Patients discharged the day of surgery will not be allowed to drive home, and someone needs to stay with them for 24 hours. ? ? ?SURGICAL WAITING ROOM VISITATION ?Patients having surgery or a procedure in a hospital may have two support people. ?Children under the age of 46 must have an adult with them who is not the patient. ?They may stay in the waiting area during the procedure and may switch out with other visitors. If the patient needs to stay at the hospital during part of their recovery, the visitor guidelines for inpatient rooms apply. ? ?Please refer to the [facility] website for the visitor guidelines for Inpatients (after your surgery is over and you are in a regular room).  ? ? ? ? ? ?Special instructions:   ? ?Oral Hygiene is also important to reduce your risk of infection.  Remember - BRUSH YOUR TEETH THE MORNING OF SURGERY WITH YOUR REGULAR TOOTHPASTE ? ? ?[place]- Preparing For Surgery ? ?Before surgery, you can play an important role. Because skin is not sterile, your skin needs to be as free of germs as possible. You can reduce the number of germs on your skin by washing with CHG (chlorahexidine gluconate) Soap before surgery.  CHG is an antiseptic cleaner which kills germs and bonds with the skin to continue killing germs even after washing.   ? ? ?Please do not use if you have an allergy to CHG or antibacterial soaps. If your skin becomes reddened/irritated stop using the CHG.  ?Do not shave (including legs and underarms) for at least 48 hours prior to first CHG shower. It is OK to shave your face. ? ?Please follow these instructions carefully. ?  ? ? Shower the NIGHT BEFORE SURGERY and the MORNING OF SURGERY with CHG Soap.  ? If you chose to wash your hair, wash your hair first as usual with your normal shampoo. After you shampoo, rinse your hair and body thoroughly to remove the shampoo.  Then [employer]  and genitals (private parts) with your normal soap and rinse thoroughly to remove soap. ? ?After that Use CHG Soap as you would any other liquid soap. You can apply CHG directly to the skin and wash gently with a scrungie or a clean washcloth.  ? ?Apply the CHG Soap to your body ONLY FROM THE NECK DOWN.  Do not use on open wounds or open sores. Avoid contact with your eyes, ears, mouth and genitals (private parts). Wash Face and genitals (private parts)  with your normal soap.  ? ?Wash thoroughly, paying special attention to the area where your surgery will be performed. ? ?Thoroughly rinse your body with warm water from the neck down. ? ?DO NOT shower/wash with your normal soap after using and rinsing off the CHG Soap. ? ?Pat yourself dry with a CLEAN TOWEL. ? ?Wear CLEAN PAJAMAS to bed the night before surgery ? ?  Place CLEAN SHEETS on your bed the night before your surgery ? ?DO NOT SLEEP WITH PETS. ? ? ?Day of Surgery: ? ?Take a shower with CHG soap. ?Wear Clean/Comfortable clothing the morning of surgery ?Do not apply any deodorants/lotions.   ?Remember to brush your teeth WITH YOUR REGULAR TOOTHPASTE. ? ? ? ?If you received a COVID test during your pre-op visit  it is requested that you wear a mask when out in public, stay away from anyone that may not be feeling well and notify your surgeon if you develop symptoms. If you have been in contact with anyone that has tested positive in the last 10 days please notify you surgeon. ? ?  ?Please read over the following fact sheets that you were given.  ? ?

## 2022-03-18 ENCOUNTER — Encounter (HOSPITAL_COMMUNITY): Payer: Self-pay

## 2022-03-18 ENCOUNTER — Encounter: Payer: Self-pay | Admitting: Vascular Surgery

## 2022-03-18 ENCOUNTER — Other Ambulatory Visit: Payer: Self-pay

## 2022-03-18 ENCOUNTER — Encounter (HOSPITAL_COMMUNITY)
Admission: RE | Admit: 2022-03-18 | Discharge: 2022-03-18 | Disposition: A | Discharge status: Home / Self Care | Payer: Medicare Other | Source: Ambulatory Visit | Attending: Vascular Surgery | Admitting: Vascular Surgery

## 2022-03-18 VITALS — BP 138/62 | HR 59 | Temp 97.7°F | Resp 18 | Ht 72.0 in | Wt 175.1 lb

## 2022-03-18 DIAGNOSIS — I6529 Occlusion and stenosis of unspecified carotid artery: Secondary | ICD-10-CM | POA: Diagnosis not present

## 2022-03-18 DIAGNOSIS — M549 Dorsalgia, unspecified: Secondary | ICD-10-CM | POA: Diagnosis not present

## 2022-03-18 DIAGNOSIS — I5022 Chronic systolic (congestive) heart failure: Secondary | ICD-10-CM

## 2022-03-18 DIAGNOSIS — J449 Chronic obstructive pulmonary disease, unspecified: Secondary | ICD-10-CM | POA: Insufficient documentation

## 2022-03-18 DIAGNOSIS — Z8673 Personal history of transient ischemic attack (TIA), and cerebral infarction without residual deficits: Secondary | ICD-10-CM | POA: Diagnosis not present

## 2022-03-18 DIAGNOSIS — I25810 Atherosclerosis of coronary artery bypass graft(s) without angina pectoris: Secondary | ICD-10-CM | POA: Insufficient documentation

## 2022-03-18 DIAGNOSIS — I252 Old myocardial infarction: Secondary | ICD-10-CM | POA: Diagnosis not present

## 2022-03-18 DIAGNOSIS — Z87891 Personal history of nicotine dependence: Secondary | ICD-10-CM | POA: Insufficient documentation

## 2022-03-18 DIAGNOSIS — E1100 Type 2 diabetes mellitus with hyperosmolarity without nonketotic hyperglycemic-hyperosmolar coma (NKHHC): Secondary | ICD-10-CM | POA: Insufficient documentation

## 2022-03-18 DIAGNOSIS — G8929 Other chronic pain: Secondary | ICD-10-CM | POA: Diagnosis not present

## 2022-03-18 DIAGNOSIS — I130 Hypertensive heart and chronic kidney disease with heart failure and stage 1 through stage 4 chronic kidney disease, or unspecified chronic kidney disease: Secondary | ICD-10-CM | POA: Insufficient documentation

## 2022-03-18 DIAGNOSIS — N189 Chronic kidney disease, unspecified: Secondary | ICD-10-CM | POA: Diagnosis not present

## 2022-03-18 DIAGNOSIS — Z8679 Personal history of other diseases of the circulatory system: Secondary | ICD-10-CM | POA: Diagnosis not present

## 2022-03-18 DIAGNOSIS — Z01812 Encounter for preprocedural laboratory examination: Secondary | ICD-10-CM | POA: Insufficient documentation

## 2022-03-18 DIAGNOSIS — I2510 Atherosclerotic heart disease of native coronary artery without angina pectoris: Secondary | ICD-10-CM | POA: Insufficient documentation

## 2022-03-18 DIAGNOSIS — E1122 Type 2 diabetes mellitus with diabetic chronic kidney disease: Secondary | ICD-10-CM | POA: Insufficient documentation

## 2022-03-18 DIAGNOSIS — E785 Hyperlipidemia, unspecified: Secondary | ICD-10-CM | POA: Insufficient documentation

## 2022-03-18 HISTORY — DX: Cerebral infarction, unspecified: I63.9

## 2022-03-18 HISTORY — DX: Chronic kidney disease, unspecified: N18.9

## 2022-03-18 HISTORY — DX: Disorder of arteries and arterioles, unspecified: I77.9

## 2022-03-18 LAB — COMPREHENSIVE METABOLIC PANEL
ALT: 16 U/L (ref 0–44)
AST: 20 U/L (ref 15–41)
Albumin: 3.7 g/dL (ref 3.5–5.0)
Alkaline Phosphatase: 67 U/L (ref 38–126)
Anion gap: 6 (ref 5–15)
BUN: 20 mg/dL (ref 8–23)
CO2: 25 mmol/L (ref 22–32)
Calcium: 9.2 mg/dL (ref 8.9–10.3)
Chloride: 108 mmol/L (ref 98–111)
Creatinine, Ser: 1.57 mg/dL — ABNORMAL HIGH (ref 0.61–1.24)
GFR, Estimated: 45 mL/min — ABNORMAL LOW (ref >60)
Glucose, Bld: 130 mg/dL — ABNORMAL HIGH (ref 70–99)
Potassium: 3.8 mmol/L (ref 3.5–5.1)
Sodium: 139 mmol/L (ref 135–145)
Total Bilirubin: 0.7 mg/dL (ref 0.3–1.2)
Total Protein: 7.2 g/dL (ref 6.5–8.1)

## 2022-03-18 LAB — TYPE AND SCREEN
ABO/RH(D): O POS
Antibody Screen: NEGATIVE

## 2022-03-18 LAB — URINALYSIS, ROUTINE W REFLEX MICROSCOPIC
Bacteria, UA: NONE SEEN
Bilirubin Urine: NEGATIVE
Glucose, UA: NEGATIVE mg/dL
Ketones, ur: NEGATIVE mg/dL
Leukocytes,Ua: NEGATIVE
Nitrite: NEGATIVE
Protein, ur: 30 mg/dL — AB
Specific Gravity, Urine: 1.017 (ref 1.005–1.030)
pH: 5 (ref 5.0–8.0)

## 2022-03-18 LAB — CBC
HCT: 39.5 % (ref 39.0–52.0)
Hemoglobin: 13.4 g/dL (ref 13.0–17.0)
MCH: 32.7 pg (ref 26.0–34.0)
MCHC: 33.9 g/dL (ref 30.0–36.0)
MCV: 96.3 fL (ref 80.0–100.0)
Platelets: UNDETERMINED 10*3/uL (ref 150–400)
RBC: 4.1 MIL/uL — ABNORMAL LOW (ref 4.22–5.81)
RDW: 13.2 % (ref 11.5–15.5)
WBC: 9.8 10*3/uL (ref 4.0–10.5)
nRBC: 0 % (ref 0.0–0.2)

## 2022-03-18 LAB — GLUCOSE, CAPILLARY: Glucose-Capillary: 161 mg/dL — ABNORMAL HIGH (ref 70–99)

## 2022-03-18 LAB — APTT: aPTT: 37 seconds — ABNORMAL HIGH (ref 24–36)

## 2022-03-18 LAB — PROTIME-INR
INR: 1.2 (ref 0.8–1.2)
Prothrombin Time: 15.5 seconds — ABNORMAL HIGH (ref 11.4–15.2)

## 2022-03-18 LAB — HEMOGLOBIN A1C
Hgb A1c MFr Bld: 7 % — ABNORMAL HIGH (ref 4.8–5.6)
Mean Plasma Glucose: 154.2 mg/dL

## 2022-03-18 LAB — SURGICAL PCR SCREEN
MRSA, PCR: NEGATIVE
Staphylococcus aureus: NEGATIVE

## 2022-03-18 NOTE — Progress Notes (Signed)
PCP - Dr. Sallee Lange ?Cardiologist - Dr. Harl Bowie with [facility] ? ?Chest x-ray - Not indicated ?EKG - 02/11/22 ?Stress Test - Years ago  ?ECHO - 3/11/3 ?Cardiac Cath - 04/16/18  ? ?Sleep Study - Denies ? ?DM - Type II ?CBG at PAT appt 161 ?Fasting Blood Sugar - averages 140 ?Checks Blood Sugar daily ? ?Blood Thinner Instructions:Brilinta Requested to call Dr. Claretha Cooper office for instruction  ?Aspirin Instructions: May continue through day of surgery ? ?Anesthesia review:  Yes recent admission for CVA 02/11/22 ? ?Patient denies shortness of breath, fever, cough and chest pain at PAT appointment ? ? ?All instructions explained to the patient, with a verbal understanding of the material. Patient agrees to go over the instructions while at home for a better understanding. The opportunity to ask questions was provided. ? ? ?

## 2022-03-21 ENCOUNTER — Encounter (HOSPITAL_COMMUNITY): Payer: Self-pay

## 2022-03-21 NOTE — Progress Notes (Addendum)
Anesthesia Chart Review: ? Case: [id number] Date/Time: 03/25/22 1226  ? Procedure: Left Transcarotid Artery Revascularization (Left)  ? Anesthesia type: General  ? Pre-op diagnosis: CAROTID STENOSIS  ? Location: [facility] OR ROOM 16 / [facility] OR  ? Surgeons: Waynetta Sandy, MD  ? ?  ? ? ?DISCUSSION: Patient is a 81 year old male scheduled for the above procedure. ? 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 ? ?[place] admission 02/11/22-02/14/22 after brain MRI for pulsatile tinnitus showed for multiple punctate supratentorial acute infarcts bilaterally with suspicion for embolic etiology. Work up was negative for DVT, positive agitated saline contrast bubble study on TEE suggesting interatrial shunt, positive for severe bilateral ICA stenosis, and < 1% afib burden was noted on event monitor. Also with known 5.3 cm AAA. Neurology recommended referral to vascular surgery for symptomatic carotid artery stenosis. Continue ASA and Brilinta, as well as statin therapy. [Update 03/22/22 12:36 PM: [facility] staff instructed patient to continue ASA and Brilinta.] ? ?He had follow-up with Dr. Donzetta Matters on 03/02/22. He wrote, "He appears to be a candidate for both carotid endarterectomy and transcarotid artery stenting.  Given that he is on aspirin and Brilinta I think that transcarotid stenting would be a good fit for him.  We discussed starting with the left side given that he is right-hand dominant.  We have also discussed that we will get these treated prior to discussing any intervention with his abdominal aortic  aneurysm." Patient was unsure if he should continue Brilinta--typically DAPT continued for CTAR/carotid stenting, but advised by RN to contact [facility] to clarify instructions. I also reached out to [facility] RN to follow-up with patient.  ? ?His last PLT count was 108K on 02/14/22. His 03/18/22 showed clumped platelets, so will order repeat CBC on arrival. 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 Anesthesia team to evaluate on the day of surgery.  ? ? ?VS: BP 138/62   Pulse (!) 59   Temp 36.5 ?C (Oral)   Resp 18   Ht 6' (1.829 m)   Wt 79.4 kg   SpO2 100%   BMI 23.75 kg/m?  ? ? ?PROVIDERS: ?Kathyrn Drown, MD is PCP  ?Carlyle Dolly, MD is primary cardiologist. Last office visit 09/29/21. Seen for TEE by Gwyndolyn Kaufman, MD on 02/14/22.  ?[name], MD is vascular surgeon ? ? ?LABS: Preoperative labs noted. Cr 1.57, consistent with previous labs. Platelets were clumpes, so unable to estaimate platelet count--previously with mild thrombocytopenia ~ 108-130K, so will get STAT CBC on arrival to re-check PLT count as loas one is > 30 days ago.  ?(all labs ordered are listed, but only abnormal results are displayed) ? ?Labs Reviewed  ?GLUCOSE, CAPILLARY - Abnormal; Notable for the following components:  ?    Result Value  ? Glucose-Capillary 161 (*)   ? All other components within normal limits  ?CBC - Abnormal; Notable for the following components:  ? RBC 4.10 (*)   ? All other components within normal limits  ?COMPREHENSIVE METABOLIC PANEL - Abnormal; Notable for the following components:  ? Glucose, Bld 130 (*)   ?  Creatinine, Ser 1.57 (*)   ? GFR, Estimated 45 (*)   ? All other components within normal limits  ?PROTIME-INR - Abnormal; Notable for the following components:  ? Prothrombin Time 15.5 (*)   ? All other components within normal limits  ?APTT - Abnormal; Notable for the following components:  ? aPTT 37 (*)   ? All other components within normal limits   ?URINALYSIS, ROUTINE W REFLEX MICROSCOPIC - Abnormal; Notable for the following components:  ? Hgb urine dipstick SMALL (*)   ? Protein, ur 30 (*)   ? All other components within normal limits  ?HEMOGLOBIN A1C - Abnormal; Notable for the following components:  ? Hgb A1c MFr Bld 7.0 (*)   ? All other components within normal limits  ?SURGICAL PCR SCREEN  ?TYPE AND SCREEN  ? ? ? ?IMAGES: ?CTA Neck 02/12/22: ?IMPRESSION: ?1. Progressive atherosclerosis in the head and neck. No large vessel ?occlusion. ?2. 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 Prominent soft plaque in ?both carotid bulbs with plaque ulceration. ?3. Moderate bilateral intracranial ICA stenoses. ?4. Severe left V4 stenosis. ?5. Aortic Atherosclerosis (ICD10-I70.0) and Emphysema (ICD10-J43.9). ?  ?MRI Brain 02/11/22: ?IMPRESSION: ?1. Multiple punctate supratentorial acute infarcts bilaterally, ?detailed above. Given involvement of multiple vascular territories, ?consider an embolic etiology. No significant mass effect. ?2. Abnormal signal within the left intradural vertebral artery on T2 ?imaging with apparent filling defect versus atherosclerotic plaque ?on postcontrast imaging. Recommend CTA or MRA to further evaluate. ?3. No retrocochlear mass. ?4. Cerebral atrophy, chronic microvascular ischemic disease and ?remote inferior left cerebellar infarct. ? ?CTA Abd/pelvis 01/21/22: ?IMPRESSION: ?VASCULAR ?1. Interval enlargement of previously visualized juxtarenal fusiform ?abdominal aortic aneurysm measuring up to 5.3 cm, previously 4.8 cm. ?Recommend referral to a vascular specialist if not already obtained. ?This recommendation follows ACR consensus guidelines: White Paper of ?the ACR Incidental Findings Committee II on Vascular Findings. J Am ?Coll Radiol 2013; 10:789-794. ?2. 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 ?3. Similar appearing at least moderate ostial stenosis of each ostia ?of the  paired bilateral renal arteries. ?  ?NON-VASCULAR ?1. No acute abdominopelvic abnormality. ?2. Diverticulosis. ? ? ?EKG: 02/11/22:  ?Sinus rhythm ?Incomplete left bundle branch block [old] ?Confirmed by Nanda Quinton [id number]) on 02/12/2022 1:36:52 PM ? ? ?CV: ?US Carotid 03/02/22: ?Summary:  ?- Right Carotid: Velocities in the right ICA are consistent with a 80-99%  ?               stenosis.  ?- Left Carotid: Velocities in the left ICA are consistent with a 80-99%  ?stenosis.  ?- Vertebrals:  Bilateral vertebral arteries demonstrate antegrade flow.  ?- Subclavians: Normal flow hemodynamics were seen in bilateral subclavian  ?             arteries.  ? ? ?Cardiac event monitor 02/20/22-03/06/22: ?Study Highlights ?Patient had a min HR of 49 bpm, max HR of 120 bpm, and avg HR of 63 bpm. Predominant underlying rhythm was Sinus Rhythm. Atrial Fibrillation occurred (<1% burden), ranging from 66-114 bpm (avg of 85 bpm), the longest lasting 51 mins 0 secs with an avg  ?rate of 85 bpm. Atrial Fibrillation was present at activation of device. Atrial Fibrillation was detected within +/- 45 seconds of symptomatic patient event(s). Isolated SVEs were rare (<1.0%), SVE Couplets were rare (<1.0%), and SVE Triplets were rare  ?(<1.0%). Isolated VEs were rare (<1.0%), and no VE Couplets or VE Triplets were present. Ventricular Bigeminy and Trigeminy  were present. ? ? ?TEE 02/14/22: ?IMPRESSIONS  ? 1. Left ventricular ejection fraction, by estimation, is 50 to 55%. The  ?left ventricle has low normal function.  ? 2. Right ventricular systolic function is normal. The right ventricular  ?size is normal.  ? 3. Left atrial size was moderately dilated. No left atrial/left atrial  ?appendage thrombus was detected. The LAA emptying velocity was 60 cm/s.  ? 4. The mitral valve is normal in structure. Mild mitral valve  ?regurgitation.  ? 5. The aortic valve is tricuspid. There is mild calcification of the  ?aortic valve. There is mild thickening of  the aortic valve. Aortic valve  ?regurgitation is mild. Aortic valve sclerosis/calcification is present,  ?without any evidence of aortic  ?stenosis.  ? 6. Aortic dilatation noted. There is mild dilatation of the aortic ro

## 2022-03-21 NOTE — Anesthesia Preprocedure Evaluation (Addendum)
Anesthesia Evaluation  ?Patient identified by MRN, date of birth, ID band ?Patient awake ? ? ? ?Reviewed: ?Allergy & Precautions, NPO status , Patient's Chart, lab work & pertinent test results ? ?Airway ?Mallampati: II ? ?TM Distance: >3 FB ?Neck ROM: Full ? ? ? Dental ? ?(+) Edentulous Upper, Chipped, Poor Dentition ?  ?Pulmonary ?COPD, former smoker,  ?  ?Pulmonary exam normal ?breath sounds clear to auscultation ? ? ? ? ? ? Cardiovascular ?hypertension, Pt. on medications ?+ CAD, + Past MI, + Cardiac Stents and +CHF  ?Normal cardiovascular exam ?Rhythm:Regular Rate:Normal ? ?Echo 02/2022 ??1. Left ventricular ejection fraction, by estimation, is 50 to 55%. The left ventricle has low normal function.  ??2. Right ventricular systolic function is normal. The right ventricular size is normal.  ??3. Left atrial size was moderately dilated. No left atrial/left atrial appendage thrombus was detected. The LAA emptying velocity was 60 cm/s.  ??4. The mitral valve is normal in structure. Mild mitral valve  ?regurgitation.  ??5. The aortic valve is tricuspid. There is mild calcification of the aortic valve. There is mild thickening of the aortic valve. Aortic valve regurgitation is mild. Aortic valve sclerosis/calcification is present, without any evidence of aortic stenosis.  ??6. Aortic dilatation noted. There is mild dilatation of the aortic root, measuring 42 mm. There is Moderate (Grade III) plaque.  ??7. Evidence of atrial level shunting detected by color flow Doppler. Agitated saline contrast bubble study was positive with shunting observed  ?within 3-6 cardiac cycles suggestive of interatrial shunt.  ?  ?Neuro/Psych ?CVA   ? GI/Hepatic ?negative GI ROS, Neg liver ROS,   ?Endo/Other  ?diabetes ? Renal/GU ?Renal disease  ? ?  ?Musculoskeletal ?negative musculoskeletal ROS ?(+)  ? Abdominal ?  ?Peds ? Hematology ? ?(+) Blood dyscrasia, anemia ,   ?Anesthesia Other Findings ? ?  Reproductive/Obstetrics ? ?  ? ? ? ? ? ? ? ? ? ? ? ? ? ?  ?  ? ? ? ? ? ?                                  Anesthesia Evaluation  ?Patient identified by MRN, date of birth, ID band ?Patient awake ? ? ? ?Reviewed: ?Allergy & Precautions, H&P , NPO status , Patient's Chart, lab work & pertinent test results, reviewed documented beta blocker date and time  ? ?Airway ?Mallampati: II ? ?TM Distance: >3 FB ?Neck ROM: full ? ? ? Dental ?no notable dental hx. ? ?  ?Pulmonary ?COPD, former smoker,  ?  ?Pulmonary exam normal ?breath sounds clear to auscultation ? ? ? ? ? ? Cardiovascular ?Exercise Tolerance: Good ?hypertension, + angina + CAD, + Past MI, + Cardiac Stents, + CABG, + Peripheral Vascular Disease and +CHF  ? ?Rhythm:regular Rate:Normal ? ? ?  ?Neuro/Psych ?CVA negative psych ROS  ? GI/Hepatic ?negative GI ROS, Neg liver ROS,   ?Endo/Other  ?negative endocrine ROSdiabetes, Type 2 ? Renal/GU ?CRFRenal disease  ?negative genitourinary ?  ?Musculoskeletal ? ? Abdominal ?  ?Peds ? Hematology ? ?(+) Blood dyscrasia, anemia ,   ?Anesthesia Other Findings ?TEE for acute stroke.? ? ? ? ?1. Left ventricular ejection fraction, by estimation, is 50 to 55%. The  ?left ventricle has low normal function. Wall motion difficult to assess  ?due to poor visualization of the LV endocardium, however, the inferior  ?wall appears hypokinetic based on  ?limited views. There  is mild concentric left ventricular hypertrophy. Left  ?ventricular diastolic parameters are consistent with Grade I diastolic  ?dysfunction (impaired relaxation).  ??2. Right ventricular systolic function is normal. The right ventricular  ?size is normal.  ??3. Left atrial size was moderately dilated.  ??4. The mitral valve is abnormal. Mild mitral valve regurgitation.  ?Moderate mitral annular calcification.  ??5. The aortic valve is tricuspid. There is mild calcification of the  ?aortic valve. There is mild thickening of the aortic valve. Aortic valve   ?regurgitation is mild. Aortic valve sclerosis/calcification is present,  ?without any evidence of aortic  ?stenosis.  ??6. Aortic dilatation noted. There is borderline dilatation of the aortic  ?root, measuring 38 mm.  ? Reproductive/Obstetrics ?negative OB ROS ? ?  ? ? ? ? ? ? ? ? ? ? ? ? ? ?  ?  ? ? ? ? ? ? ? ? ?Anesthesia Physical ?Anesthesia Plan ? ?ASA: 4 and emergent ? ?Anesthesia Plan: General  ? ?Post-op Pain Management:   ? ?Induction:  ? ?PONV Risk Score and Plan: Propofol infusion ? ?Airway Management Planned:  ? ?Additional Equipment:  ? ?Intra-op Plan:  ? ?Post-operative Plan:  ? ?Informed Consent: I have reviewed the patients History and Physical, chart, labs and discussed the procedure including the risks, benefits and alternatives for the proposed anesthesia with the patient or authorized representative who has indicated his/her understanding and acceptance.  ? ? ? ?Dental Advisory Given ? ?Plan Discussed with: CRNA ? ?Anesthesia Plan Comments:   ? ? ? ? ? ? ?Anesthesia Quick Evaluation ? ?Anesthesia Physical ?Anesthesia Plan ? ?ASA: 4 ? ?Anesthesia Plan: General  ? ?Post-op Pain Management: Minimal or no pain anticipated  ? ?Induction: Intravenous ? ?PONV Risk Score and Plan: 2 and Ondansetron, Dexamethasone, Midazolam and Treatment may vary due to age or medical condition ? ?Airway Management Planned: Oral ETT ? ?Additional Equipment: Arterial line ? ?Intra-op Plan:  ? ?Post-operative Plan: Extubation in OR ? ?Informed Consent: I have reviewed the patients History and Physical, chart, labs and discussed the procedure including the risks, benefits and alternatives for the proposed anesthesia with the patient or authorized representative who has indicated his/her understanding and acceptance.  ? ? ? ?Dental advisory given ? ?Plan Discussed with: CRNA ? ?Anesthesia Plan Comments: (PAT note written 03/21/2022 by Myra Gianotti, PA-C. ?)  ? ? ? ? ?Anesthesia Quick Evaluation ? ?

## 2022-03-22 ENCOUNTER — Telehealth: Payer: Self-pay

## 2022-03-22 NOTE — Telephone Encounter (Addendum)
Pt called wanting to know how long he should hold Brillinta prior to upcoming surgery.  I called patient back and told him to stay on the Brillinta and Aspirin.  Pt verbalized understanding. ?

## 2022-03-23 ENCOUNTER — Other Ambulatory Visit: Payer: Self-pay | Admitting: Family Medicine

## 2022-03-23 ENCOUNTER — Ambulatory Visit: Payer: Medicare Other | Admitting: Family Medicine

## 2022-03-23 MED ORDER — AMOXICILLIN 500 MG PO CAPS: 500.0000 mg | ORAL_CAPSULE | Freq: Three times a day (TID) | ORAL | 0 refills | Status: AC

## 2022-03-25 ENCOUNTER — Encounter (HOSPITAL_COMMUNITY)
Admission: RE | Disposition: A | Discharge status: Home / Self Care | Payer: Self-pay | Source: Home / Self Care | Attending: Vascular Surgery

## 2022-03-25 ENCOUNTER — Inpatient Hospital Stay (HOSPITAL_COMMUNITY)
Admission: RE | Admit: 2022-03-25 | Discharge: 2022-03-26 | DRG: 035 | Disposition: A | Discharge status: Home / Self Care | Payer: Medicare Other | Attending: Vascular Surgery | Admitting: Vascular Surgery

## 2022-03-25 ENCOUNTER — Other Ambulatory Visit: Payer: Self-pay

## 2022-03-25 ENCOUNTER — Inpatient Hospital Stay (HOSPITAL_COMMUNITY): Payer: Medicare Other | Admitting: Vascular Surgery

## 2022-03-25 ENCOUNTER — Inpatient Hospital Stay (HOSPITAL_COMMUNITY): Payer: Medicare Other

## 2022-03-25 ENCOUNTER — Encounter (HOSPITAL_COMMUNITY): Payer: Self-pay | Admitting: Vascular Surgery

## 2022-03-25 DIAGNOSIS — Z888 Allergy status to other drugs, medicaments and biological substances status: Secondary | ICD-10-CM

## 2022-03-25 DIAGNOSIS — E785 Hyperlipidemia, unspecified: Secondary | ICD-10-CM | POA: Diagnosis not present

## 2022-03-25 DIAGNOSIS — Z951 Presence of aortocoronary bypass graft: Secondary | ICD-10-CM

## 2022-03-25 DIAGNOSIS — I6522 Occlusion and stenosis of left carotid artery: Principal | ICD-10-CM | POA: Diagnosis present

## 2022-03-25 DIAGNOSIS — R339 Retention of urine, unspecified: Secondary | ICD-10-CM | POA: Diagnosis not present

## 2022-03-25 DIAGNOSIS — Z79899 Other long term (current) drug therapy: Secondary | ICD-10-CM

## 2022-03-25 DIAGNOSIS — Z8249 Family history of ischemic heart disease and other diseases of the circulatory system: Secondary | ICD-10-CM | POA: Diagnosis not present

## 2022-03-25 DIAGNOSIS — Z87891 Personal history of nicotine dependence: Secondary | ICD-10-CM

## 2022-03-25 DIAGNOSIS — N1831 Chronic kidney disease, stage 3a: Secondary | ICD-10-CM | POA: Diagnosis present

## 2022-03-25 DIAGNOSIS — I63132 Cerebral infarction due to embolism of left carotid artery: Secondary | ICD-10-CM | POA: Diagnosis not present

## 2022-03-25 DIAGNOSIS — D631 Anemia in chronic kidney disease: Secondary | ICD-10-CM | POA: Diagnosis not present

## 2022-03-25 DIAGNOSIS — I252 Old myocardial infarction: Secondary | ICD-10-CM

## 2022-03-25 DIAGNOSIS — I2510 Atherosclerotic heart disease of native coronary artery without angina pectoris: Secondary | ICD-10-CM | POA: Diagnosis not present

## 2022-03-25 DIAGNOSIS — Z8673 Personal history of transient ischemic attack (TIA), and cerebral infarction without residual deficits: Secondary | ICD-10-CM | POA: Diagnosis not present

## 2022-03-25 DIAGNOSIS — D696 Thrombocytopenia, unspecified: Secondary | ICD-10-CM

## 2022-03-25 DIAGNOSIS — I714 Abdominal aortic aneurysm, without rupture, unspecified: Secondary | ICD-10-CM | POA: Diagnosis not present

## 2022-03-25 DIAGNOSIS — M545 Low back pain, unspecified: Secondary | ICD-10-CM | POA: Diagnosis not present

## 2022-03-25 DIAGNOSIS — J449 Chronic obstructive pulmonary disease, unspecified: Secondary | ICD-10-CM | POA: Diagnosis not present

## 2022-03-25 DIAGNOSIS — I509 Heart failure, unspecified: Secondary | ICD-10-CM | POA: Diagnosis not present

## 2022-03-25 DIAGNOSIS — I130 Hypertensive heart and chronic kidney disease with heart failure and stage 1 through stage 4 chronic kidney disease, or unspecified chronic kidney disease: Secondary | ICD-10-CM | POA: Diagnosis not present

## 2022-03-25 DIAGNOSIS — N189 Chronic kidney disease, unspecified: Secondary | ICD-10-CM | POA: Diagnosis not present

## 2022-03-25 DIAGNOSIS — I5022 Chronic systolic (congestive) heart failure: Secondary | ICD-10-CM | POA: Diagnosis present

## 2022-03-25 DIAGNOSIS — I25119 Atherosclerotic heart disease of native coronary artery with unspecified angina pectoris: Secondary | ICD-10-CM | POA: Diagnosis not present

## 2022-03-25 DIAGNOSIS — G8929 Other chronic pain: Secondary | ICD-10-CM | POA: Diagnosis present

## 2022-03-25 DIAGNOSIS — E1122 Type 2 diabetes mellitus with diabetic chronic kidney disease: Secondary | ICD-10-CM | POA: Diagnosis not present

## 2022-03-25 DIAGNOSIS — Z7982 Long term (current) use of aspirin: Secondary | ICD-10-CM

## 2022-03-25 DIAGNOSIS — Z91041 Radiographic dye allergy status: Secondary | ICD-10-CM

## 2022-03-25 DIAGNOSIS — Z955 Presence of coronary angioplasty implant and graft: Secondary | ICD-10-CM | POA: Diagnosis not present

## 2022-03-25 DIAGNOSIS — I63232 Cerebral infarction due to unspecified occlusion or stenosis of left carotid arteries: Secondary | ICD-10-CM | POA: Diagnosis not present

## 2022-03-25 DIAGNOSIS — I6529 Occlusion and stenosis of unspecified carotid artery: Secondary | ICD-10-CM | POA: Diagnosis present

## 2022-03-25 HISTORY — PX: TRANSCAROTID ARTERY REVASCULARIZATIONÂ: SHX6778

## 2022-03-25 HISTORY — PX: ULTRASOUND GUIDANCE FOR VASCULAR ACCESS: SHX6516

## 2022-03-25 LAB — GLUCOSE, CAPILLARY
Glucose-Capillary: 129 mg/dL — ABNORMAL HIGH (ref 70–99)
Glucose-Capillary: 95 mg/dL (ref 70–99)
Glucose-Capillary: 122 mg/dL — ABNORMAL HIGH (ref 70–99)
Glucose-Capillary: 98 mg/dL (ref 70–99)

## 2022-03-25 LAB — CBC
HCT: 37 % — ABNORMAL LOW (ref 39.0–52.0)
HCT: 37.8 % — ABNORMAL LOW (ref 39.0–52.0)
Hemoglobin: 12.5 g/dL — ABNORMAL LOW (ref 13.0–17.0)
Hemoglobin: 13 g/dL (ref 13.0–17.0)
MCH: 32.4 pg (ref 26.0–34.0)
MCH: 32.1 pg (ref 26.0–34.0)
MCHC: 33.8 g/dL (ref 30.0–36.0)
MCHC: 34.4 g/dL (ref 30.0–36.0)
MCV: 95.9 fL (ref 80.0–100.0)
MCV: 93.3 fL (ref 80.0–100.0)
Platelets: 106 10*3/uL — ABNORMAL LOW (ref 150–400)
Platelets: 111 10*3/uL — ABNORMAL LOW (ref 150–400)
RBC: 3.86 MIL/uL — ABNORMAL LOW (ref 4.22–5.81)
RBC: 4.05 MIL/uL — ABNORMAL LOW (ref 4.22–5.81)
RDW: 13.1 % (ref 11.5–15.5)
RDW: 12.9 % (ref 11.5–15.5)
WBC: 7.7 10*3/uL (ref 4.0–10.5)
WBC: 9.9 10*3/uL (ref 4.0–10.5)
nRBC: 0 % (ref 0.0–0.2)
nRBC: 0 % (ref 0.0–0.2)

## 2022-03-25 LAB — CREATININE, SERUM
Creatinine, Ser: 1.52 mg/dL — ABNORMAL HIGH (ref 0.61–1.24)
GFR, Estimated: 46 mL/min — ABNORMAL LOW (ref >60)

## 2022-03-25 LAB — ABO/RH: ABO/RH(D): O POS

## 2022-03-25 LAB — POCT ACTIVATED CLOTTING TIME: Activated Clotting Time: 293 seconds

## 2022-03-25 SURGERY — TRANSCAROTID ARTERY REVASCULARIZATION (TCAR)
Anesthesia: General

## 2022-03-25 MED ORDER — ACETAMINOPHEN 10 MG/ML IV SOLN
1000.0000 mg | Freq: Once | INTRAVENOUS | Status: DC | PRN
  Administered 2022-03-25: 1000 mg via INTRAVENOUS

## 2022-03-25 MED ORDER — ROCURONIUM BROMIDE 100 MG/10ML IV SOLN
INTRAVENOUS | Status: DC | PRN
  Administered 2022-03-25: 60 mg via INTRAVENOUS

## 2022-03-25 MED ORDER — CHLORHEXIDINE GLUCONATE CLOTH 2 % EX PADS: 6.0000 | MEDICATED_PAD | Freq: Once | CUTANEOUS | Status: DC

## 2022-03-25 MED ORDER — INSULIN ASPART 100 UNIT/ML IJ SOLN: 0.0000 [IU] | INTRAMUSCULAR | Status: DC | PRN

## 2022-03-25 MED ORDER — METOPROLOL TARTRATE 5 MG/5ML IV SOLN: 2.0000 mg | INTRAVENOUS | Status: DC | PRN

## 2022-03-25 MED ORDER — LACTATED RINGERS IV SOLN: INTRAVENOUS | Status: DC

## 2022-03-25 MED ORDER — FENTANYL CITRATE (PF) 250 MCG/5ML IJ SOLN
INTRAMUSCULAR | Status: DC | PRN
Start: 2022-03-25 — End: 2022-03-25
  Administered 2022-03-25 (×2): 50 ug via INTRAVENOUS

## 2022-03-25 MED ORDER — ACETAMINOPHEN 10 MG/ML IV SOLN
INTRAVENOUS | Status: AC
  Filled 2022-03-25: qty 100

## 2022-03-25 MED ORDER — HEPARIN SODIUM (PORCINE) 1000 UNIT/ML IJ SOLN
INTRAMUSCULAR | Status: DC | PRN
  Administered 2022-03-25: 10000 [IU] via INTRAVENOUS

## 2022-03-25 MED ORDER — ONDANSETRON HCL 4 MG/2ML IJ SOLN
INTRAMUSCULAR | Status: AC
  Filled 2022-03-25: qty 4

## 2022-03-25 MED ORDER — INSULIN ASPART 100 UNIT/ML IJ SOLN
0.0000 [IU] | Freq: Three times a day (TID) | INTRAMUSCULAR | Status: DC
  Administered 2022-03-26: 5 [IU] via SUBCUTANEOUS
  Administered 2022-03-26: 8 [IU] via SUBCUTANEOUS

## 2022-03-25 MED ORDER — FENTANYL CITRATE (PF) 250 MCG/5ML IJ SOLN
INTRAMUSCULAR | Status: AC
  Filled 2022-03-25: qty 5

## 2022-03-25 MED ORDER — PROTAMINE SULFATE 10 MG/ML IV SOLN
INTRAVENOUS | Status: DC | PRN
  Administered 2022-03-25: 50 mg via INTRAVENOUS

## 2022-03-25 MED ORDER — PROPOFOL 10 MG/ML IV BOLUS
INTRAVENOUS | Status: DC | PRN
  Administered 2022-03-25: 120 mg via INTRAVENOUS

## 2022-03-25 MED ORDER — OXYCODONE-ACETAMINOPHEN 5-325 MG PO TABS
1.0000 | ORAL_TABLET | ORAL | Status: DC | PRN
  Filled 2022-03-25: qty 2

## 2022-03-25 MED ORDER — VANCOMYCIN HCL IN DEXTROSE 1-5 GM/200ML-% IV SOLN
1000.0000 mg | INTRAVENOUS | Status: AC
  Administered 2022-03-25: 1000 mg via INTRAVENOUS
  Filled 2022-03-25: qty 200

## 2022-03-25 MED ORDER — LIDOCAINE HCL (PF) 1 % IJ SOLN
INTRAMUSCULAR | Status: AC
  Filled 2022-03-25: qty 30

## 2022-03-25 MED ORDER — BISACODYL 5 MG PO TBEC: 5.0000 mg | DELAYED_RELEASE_TABLET | Freq: Every day | ORAL | Status: DC | PRN

## 2022-03-25 MED ORDER — POLYETHYLENE GLYCOL 3350 17 G PO PACK: 17.0000 g | PACK | Freq: Every day | ORAL | Status: DC | PRN

## 2022-03-25 MED ORDER — PROTAMINE SULFATE 10 MG/ML IV SOLN
INTRAVENOUS | Status: AC
  Filled 2022-03-25: qty 10

## 2022-03-25 MED ORDER — ACETAMINOPHEN 650 MG RE SUPP: 325.0000 mg | RECTAL | Status: DC | PRN

## 2022-03-25 MED ORDER — HYDRALAZINE HCL 20 MG/ML IJ SOLN: 5.0000 mg | INTRAMUSCULAR | Status: DC | PRN

## 2022-03-25 MED ORDER — PHENYLEPHRINE HCL-NACL 20-0.9 MG/250ML-% IV SOLN
INTRAVENOUS | Status: DC | PRN
  Administered 2022-03-25: 20 ug/min via INTRAVENOUS

## 2022-03-25 MED ORDER — PROPOFOL 500 MG/50ML IV EMUL
INTRAVENOUS | Status: DC | PRN
  Administered 2022-03-25: 50 ug/kg/min via INTRAVENOUS

## 2022-03-25 MED ORDER — LIDOCAINE HCL (CARDIAC) PF 100 MG/5ML IV SOSY
PREFILLED_SYRINGE | INTRAVENOUS | Status: DC | PRN
  Administered 2022-03-25: 80 mg via INTRAVENOUS

## 2022-03-25 MED ORDER — METOPROLOL SUCCINATE ER 25 MG PO TB24
25.0000 mg | ORAL_TABLET | Freq: Every day | ORAL | Status: DC
  Administered 2022-03-26: 25 mg via ORAL
  Filled 2022-03-25: qty 1

## 2022-03-25 MED ORDER — DEXAMETHASONE SODIUM PHOSPHATE 10 MG/ML IJ SOLN
INTRAMUSCULAR | Status: DC | PRN
  Administered 2022-03-25: 20 mg via INTRAVENOUS

## 2022-03-25 MED ORDER — IODIXANOL 320 MG/ML IV SOLN
INTRAVENOUS | Status: DC | PRN
  Administered 2022-03-25: 100 mL

## 2022-03-25 MED ORDER — ROSUVASTATIN CALCIUM 20 MG PO TABS
40.0000 mg | ORAL_TABLET | Freq: Every day | ORAL | Status: DC
  Administered 2022-03-26: 40 mg via ORAL
  Filled 2022-03-25: qty 2

## 2022-03-25 MED ORDER — SUGAMMADEX SODIUM 200 MG/2ML IV SOLN
INTRAVENOUS | Status: DC | PRN
  Administered 2022-03-25: 156 mg via INTRAVENOUS

## 2022-03-25 MED ORDER — FENTANYL CITRATE (PF) 100 MCG/2ML IJ SOLN: 25.0000 ug | INTRAMUSCULAR | Status: DC | PRN

## 2022-03-25 MED ORDER — HEPARIN SODIUM (PORCINE) 5000 UNIT/ML IJ SOLN
5000.0000 [IU] | Freq: Three times a day (TID) | INTRAMUSCULAR | Status: DC
  Administered 2022-03-26 (×2): 5000 [IU] via SUBCUTANEOUS
  Filled 2022-03-25 (×2): qty 1

## 2022-03-25 MED ORDER — DEXAMETHASONE SODIUM PHOSPHATE 10 MG/ML IJ SOLN
INTRAMUSCULAR | Status: AC
  Filled 2022-03-25: qty 4

## 2022-03-25 MED ORDER — ONDANSETRON HCL 4 MG/2ML IJ SOLN
INTRAMUSCULAR | Status: DC | PRN
  Administered 2022-03-25: 4 mg via INTRAVENOUS

## 2022-03-25 MED ORDER — PANTOPRAZOLE SODIUM 40 MG PO TBEC
40.0000 mg | DELAYED_RELEASE_TABLET | Freq: Every day | ORAL | Status: DC
  Administered 2022-03-26: 40 mg via ORAL
  Filled 2022-03-25 (×2): qty 1

## 2022-03-25 MED ORDER — SODIUM CHLORIDE 0.9 % IV SOLN: INTRAVENOUS | Status: DC

## 2022-03-25 MED ORDER — ONDANSETRON HCL 4 MG/2ML IJ SOLN: 4.0000 mg | Freq: Four times a day (QID) | INTRAMUSCULAR | Status: DC | PRN

## 2022-03-25 MED ORDER — VANCOMYCIN HCL IN DEXTROSE 1-5 GM/200ML-% IV SOLN
1000.0000 mg | Freq: Two times a day (BID) | INTRAVENOUS | Status: AC
  Administered 2022-03-26 (×2): 1000 mg via INTRAVENOUS
  Filled 2022-03-25 (×2): qty 200

## 2022-03-25 MED ORDER — LIDOCAINE 2% (20 MG/ML) 5 ML SYRINGE
INTRAMUSCULAR | Status: AC
  Filled 2022-03-25: qty 10

## 2022-03-25 MED ORDER — LACTATED RINGERS IV SOLN: INTRAVENOUS | Status: DC | PRN

## 2022-03-25 MED ORDER — MORPHINE SULFATE (PF) 2 MG/ML IV SOLN
2.0000 mg | INTRAVENOUS | Status: DC | PRN
  Administered 2022-03-25: 4 mg via INTRAVENOUS
  Filled 2022-03-25: qty 2

## 2022-03-25 MED ORDER — ACETAMINOPHEN 325 MG PO TABS: 325.0000 mg | ORAL_TABLET | ORAL | Status: DC | PRN

## 2022-03-25 MED ORDER — CHLORHEXIDINE GLUCONATE 0.12 % MT SOLN
OROMUCOSAL | Status: AC
Start: 2022-03-25 — End: 2022-03-25
  Administered 2022-03-25: 15 mL
  Filled 2022-03-25: qty 15

## 2022-03-25 MED ORDER — GLYCOPYRROLATE PF 0.2 MG/ML IJ SOSY
PREFILLED_SYRINGE | INTRAMUSCULAR | Status: DC | PRN
  Administered 2022-03-25 (×2): .2 mg via INTRAVENOUS

## 2022-03-25 MED ORDER — HEMOSTATIC AGENTS (NO CHARGE) OPTIME
TOPICAL | Status: DC | PRN
  Administered 2022-03-25: 1 via TOPICAL

## 2022-03-25 MED ORDER — DIPHENHYDRAMINE HCL 50 MG/ML IJ SOLN
INTRAMUSCULAR | Status: DC | PRN
  Administered 2022-03-25: 50 mg via INTRAVENOUS

## 2022-03-25 MED ORDER — LABETALOL HCL 5 MG/ML IV SOLN: 10.0000 mg | INTRAVENOUS | Status: DC | PRN

## 2022-03-25 MED ORDER — MAGNESIUM SULFATE 2 GM/50ML IV SOLN: 2.0000 g | Freq: Every day | INTRAVENOUS | Status: DC | PRN

## 2022-03-25 MED ORDER — TICAGRELOR 60 MG PO TABS
60.0000 mg | ORAL_TABLET | Freq: Two times a day (BID) | ORAL | Status: DC
  Administered 2022-03-25 – 2022-03-26 (×2): 60 mg via ORAL
  Filled 2022-03-25 (×4): qty 1

## 2022-03-25 MED ORDER — HEPARIN 6000 UNIT IRRIGATION SOLUTION
Status: DC | PRN
  Administered 2022-03-25: 1

## 2022-03-25 MED ORDER — ROCURONIUM BROMIDE 10 MG/ML (PF) SYRINGE
PREFILLED_SYRINGE | INTRAVENOUS | Status: AC
  Filled 2022-03-25: qty 20

## 2022-03-25 MED ORDER — ALUM & MAG HYDROXIDE-SIMETH 200-200-20 MG/5ML PO SUSP: 15.0000 mL | ORAL | Status: DC | PRN

## 2022-03-25 MED ORDER — POTASSIUM CHLORIDE CRYS ER 20 MEQ PO TBCR: 20.0000 meq | EXTENDED_RELEASE_TABLET | Freq: Every day | ORAL | Status: DC | PRN

## 2022-03-25 MED ORDER — 0.9 % SODIUM CHLORIDE (POUR BTL) OPTIME
TOPICAL | Status: DC | PRN
  Administered 2022-03-25: 1000 mL

## 2022-03-25 MED ORDER — LOSARTAN POTASSIUM 25 MG PO TABS
25.0000 mg | ORAL_TABLET | Freq: Every day | ORAL | Status: DC
  Administered 2022-03-26: 25 mg via ORAL
  Filled 2022-03-25: qty 1

## 2022-03-25 MED ORDER — SODIUM CHLORIDE 0.9 % IV SOLN: 500.0000 mL | Freq: Once | INTRAVENOUS | Status: DC | PRN

## 2022-03-25 MED ORDER — AMOXICILLIN 500 MG PO CAPS
500.0000 mg | ORAL_CAPSULE | Freq: Three times a day (TID) | ORAL | Status: DC
  Administered 2022-03-25 – 2022-03-26 (×3): 500 mg via ORAL
  Filled 2022-03-25 (×4): qty 1

## 2022-03-25 MED ORDER — HEPARIN SODIUM (PORCINE) 1000 UNIT/ML IJ SOLN
INTRAMUSCULAR | Status: AC
  Filled 2022-03-25: qty 10

## 2022-03-25 MED ORDER — ASPIRIN EC 81 MG PO TBEC
81.0000 mg | DELAYED_RELEASE_TABLET | Freq: Every day | ORAL | Status: DC
  Administered 2022-03-26: 81 mg via ORAL
  Filled 2022-03-25: qty 1

## 2022-03-25 MED ORDER — HEPARIN 6000 UNIT IRRIGATION SOLUTION
Status: AC
  Filled 2022-03-25: qty 500

## 2022-03-25 SURGICAL SUPPLY — 58 items
BAG COUNTER SPONGE SURGICOUNT (BAG) ×3 IMPLANT
BALLN STERLING RX 6X40X80 (BALLOONS) ×3
BALLOON STERLING RX 6X40X80 (BALLOONS) IMPLANT
CANISTER SUCT 3000ML PPV (MISCELLANEOUS) ×3 IMPLANT
CATH ROBINSON RED A/P 18FR (CATHETERS) IMPLANT
CLIP LIGATING EXTRA MED SLVR (CLIP) ×3 IMPLANT
CLIP LIGATING EXTRA SM BLUE (MISCELLANEOUS) ×3 IMPLANT
COVER PROBE W GEL 5X96 (DRAPES) ×3 IMPLANT
DERMABOND ADVANCED (GAUZE/BANDAGES/DRESSINGS) ×2
DERMABOND ADVANCED .7 DNX12 (GAUZE/BANDAGES/DRESSINGS) ×2 IMPLANT
DRAPE FEMORAL ANGIO 80X135IN (DRAPES) ×3 IMPLANT
ELECT REM PT RETURN 9FT ADLT (ELECTROSURGICAL) ×3
ELECTRODE REM PT RTRN 9FT ADLT (ELECTROSURGICAL) ×2 IMPLANT
GLOVE BIO SURGEON STRL SZ7.5 (GLOVE) ×3 IMPLANT
GOWN STRL REUS W/ TWL LRG LVL3 (GOWN DISPOSABLE) ×4 IMPLANT
GOWN STRL REUS W/ TWL XL LVL3 (GOWN DISPOSABLE) ×2 IMPLANT
GOWN STRL REUS W/TWL LRG LVL3 (GOWN DISPOSABLE) ×6
GOWN STRL REUS W/TWL XL LVL3 (GOWN DISPOSABLE) ×3
GUIDEWIRE ENROUTE 0.014 (WIRE) ×3 IMPLANT
HEMOSTAT SNOW SURGICEL 2X4 (HEMOSTASIS) IMPLANT
INSERT FOGARTY SM (MISCELLANEOUS) IMPLANT
INTRODUCER KIT GALT 7CM (INTRODUCER) ×3
KIT BASIN OR (CUSTOM PROCEDURE TRAY) ×3 IMPLANT
KIT ENCORE 26 ADVANTAGE (KITS) ×3 IMPLANT
KIT INTRODUCER GALT 7 (INTRODUCER) ×2 IMPLANT
KIT TURNOVER KIT B (KITS) ×3 IMPLANT
NDL HYPO 25GX1X1/2 BEV (NEEDLE) IMPLANT
NEEDLE HYPO 25GX1X1/2 BEV (NEEDLE) IMPLANT
PACK CAROTID (CUSTOM PROCEDURE TRAY) ×3 IMPLANT
POSITIONER HEAD DONUT 9IN (MISCELLANEOUS) ×3 IMPLANT
POWDER SURGICEL 3.0 GRAM (HEMOSTASIS) ×1 IMPLANT
PROTECTION STATION PRESSURIZED (MISCELLANEOUS)
SET MICROPUNCTURE 5F STIFF (MISCELLANEOUS) ×1 IMPLANT
SHEATH AVANTI 11CM 5FR (SHEATH) IMPLANT
SHUNT CAROTID BYPASS 10 (VASCULAR PRODUCTS) IMPLANT
SHUNT CAROTID BYPASS 12FRX15.5 (VASCULAR PRODUCTS) IMPLANT
STATION PROTECTION PRESSURIZED (MISCELLANEOUS) IMPLANT
STENT TRANSCAROTID SYS 10X40 (Permanent Stent) ×1 IMPLANT
SUT MNCRL AB 4-0 PS2 18 (SUTURE) ×3 IMPLANT
SUT PROLENE 5 0 C 1 24 (SUTURE) ×3 IMPLANT
SUT PROLENE 6 0 BV (SUTURE) IMPLANT
SUT PROLENE 7 0 BV 1 (SUTURE) IMPLANT
SUT SILK 2 0 PERMA HAND 18 BK (SUTURE) ×3 IMPLANT
SUT SILK 2 0 SH CR/8 (SUTURE) ×3 IMPLANT
SUT SILK 3 0 (SUTURE)
SUT SILK 3-0 18XBRD TIE 12 (SUTURE) IMPLANT
SUT VIC AB 3-0 SH 27 (SUTURE) ×3
SUT VIC AB 3-0 SH 27X BRD (SUTURE) ×2 IMPLANT
SYR 10ML LL (SYRINGE) ×9 IMPLANT
SYR 20ML LL LF (SYRINGE) ×4 IMPLANT
SYR CONTROL 10ML LL (SYRINGE) ×2 IMPLANT
SYSTEM TRANSCAROTID NEUROPRTCT (MISCELLANEOUS) ×2 IMPLANT
TOWEL GREEN STERILE (TOWEL DISPOSABLE) ×3 IMPLANT
TRANSCAROTID NEUROPROTECT SYS (MISCELLANEOUS) ×3
TUBING ART PRESS 48 MALE/FEM (TUBING) IMPLANT
TUBING EXTENTION W/L.L. (IV SETS) IMPLANT
WATER STERILE IRR 1000ML POUR (IV SOLUTION) ×3 IMPLANT
WIRE BENTSON .035X145CM (WIRE) ×3 IMPLANT

## 2022-03-25 NOTE — Op Note (Addendum)
? ? ?Patient name: MATHIUS BIRKELAND MRN: [id number] DOB: 1941-07-25 Sex: male ? ?03/25/2022 ?Pre-operative Diagnosis: Symptomatic left ICA stenosis ?Post-operative diagnosis:  Same ?Surgeon:  Eda Paschal. Donzetta Matters, MD ?Assistant: Paulo Fruit, PA ?Procedure Performed: ?1.  Left transcarotid artery stenting with 10 x 40 mm EnRoute stent with predilatation 6 x 30 mm balloon and flow reversal neuro protection ?2.  Ultrasound-guided placement right common femoral vein 8 French sheath for flow reversal ? ?Indications: 81 year old male followed by me for abdominal aortic aneurysm recently found to have bilateral symptomatic ICA stenosis.  He is on dual antiplatelet therapy and statin.  He is now indicated for transcarotid artery stenting on the left given that he is right-handed and the plan will be for interval right ICA stenting. ? ? ?Given the complexity of the case,  the assistant was necessary in order to expedient the procedure and safely perform the technical aspects of the operation.  The assistant provided traction and countertraction to assist with exposure of the common carotid artery and facilitate wire and balloon and stent exchange.    These skills could not have been adequately performed by a scrub tech assistant.  ? ? ?Findings: There was a very tight stenosis of the proximal ICA.  After stenting this was resolved to 0% stenosis.  At completion we examined the filter on the flow reversal unit was laden with very small soft and calcified material. ?  ?Procedure:  The patient was identified in the holding area and taken to the operating room where general anesthesia was induced he was sterilely prepped draped in the neck and chest and bowel groins in usual fashion, antibiotics was administered and timeout was called.  We began using ultrasound to identify the right common femoral vein which was patent and compressible.  The vein was cannulated with micropuncture needle followed by wire and sheath and Bentson wire was  placed followed by an 8 [place] flow reversal sheath this was sutured to the skin with silk suture and flushed with heparinized saline.  Ultrasound used to identify the common carotid artery between the 2 heads of the sternocleidomastoid.  An vertical incision was made we dissected down through the 2 heads identify the common carotid artery.  The IJ was retracted laterally.  Patient was fully heparinized ACT returned almost 300.  We encircled the common carotid artery with vessel loop to be used as a clamp and umbilical tape.  A 5-0 Prolene U stitch was placed.  The artery was cannulated between the U stitch with a micropuncture needle followed by wire and sheath.  Sheath was placed a 3 cm initial angiogram was performed.  We placed a J-wire to the level of the carotid bifurcation under fluoroscopic guidance placed the 8 French flow reversal sheath.  This was sutured the skin in 2 areas.  Flow reversal was connected and noted to be intact.  We then prepared a wire and balloon.  The TCAR timeout was performed.  Common carotid artery was clamped by cinching the vessel loop.  Flow reversal was noted to be in place.  A wire was placed into the intracerebral ICA under fluoroscopic guidance.  The ICA was predilated with 6 mm balloon followed by direct stenting with 10 x 40 mm stent.  After 2 minutes wait time we performed angiography in 2 views which demonstrated no residual stenosis.  Satisfied with this we remove the wire.  The clamp was let loose.  Flow reversal was discontinued.  The sheath was removed and  5-0 Prolene suture was cinched.  Doppler demonstrated adequate flow with expected signal.  50 mg of protamine was administered.  We obtain hemostasis in the wound and thoroughly irrigated and closed in layers of Vicryl Monocryl.  The sheath was removed in the right groin pressure was held till hemostasis obtained.  He was awakened from anesthesia and noted to be neurologically intact transferred to the recovery room  in stable condition.  All counts were correct at completion. ?Contrast: 20cc ? ?EBL: 25cc ? ? ?Davier Tramell C. Donzetta Matters, MD ?[facility] ?Office: [phone] ?Pager: [phone] ? ? ?

## 2022-03-25 NOTE — Interval H&P Note (Signed)
History and Physical Interval Note: ? ?03/25/2022 ?1:26 PM ? ?Peter [name]  has presented today for surgery, with the diagnosis of CAROTID STENOSIS.  The various methods of treatment have been discussed with the patient and family. After consideration of risks, benefits and other options for treatment, the patient has consented to  Procedure(s): ?Left Transcarotid Artery Revascularization (Left) as a surgical intervention.  The patient's history has been reviewed, patient examined, no change in status, stable for surgery.  I have reviewed the patient's chart and labs.  Questions were answered to the patient's satisfaction.   ? ? ?[name] ? ? ?

## 2022-03-25 NOTE — Anesthesia Procedure Notes (Signed)
Procedure Name: Intubation ?Date/Time: 03/25/2022 4:50 PM ?Performed by: Minerva Ends, CRNA ?Pre-anesthesia Checklist: Patient identified, Emergency Drugs available, Suction available and Patient being monitored ?Patient Re-evaluated:Patient Re-evaluated prior to induction ?Oxygen Delivery Method: Circle system utilized ?Preoxygenation: Pre-oxygenation with 100% oxygen ?Induction Type: IV induction ?Ventilation: Mask ventilation without difficulty ?Laryngoscope Size: Mac and 4 ?Grade View: Grade I ?Tube type: Oral ?Tube size: 7.0 mm ?Number of attempts: 1 ?Airway Equipment and Method: Stylet and Oral airway ?Placement Confirmation: ETT inserted through vocal cords under direct vision, positive ETCO2 and breath sounds checked- equal and bilateral ?Secured at: 23 cm ?Tube secured with: Tape ?Dental Injury: Teeth and Oropharynx as per pre-operative assessment  ? ? ? ? ?

## 2022-03-25 NOTE — OR Nursing (Signed)
Patient's cell phone was place into a sealed biohazard bag with a patient sticker on it. The bag with the cell phone was then placed into the patient's chart. PACU RN will be notified about the cell phone also. ?

## 2022-03-25 NOTE — Transfer of Care (Signed)
Immediate Anesthesia Transfer of Care Note ? ?Patient: Peter [name] ? ?Procedure(s) Performed: LEFT TRANCAROTID ARTERY REVASCULARIZATION USING 87m X 469mENROUTE STENT SYSTEM (Left) ?ULTRASOUND GUIDANCE FOR VASCULAR ACCESS ? ?Patient Location: PACU ? ?Anesthesia Type:General ? ?Level of Consciousness: sedated ? ?Airway & Oxygen Therapy: Patient Spontanous Breathing ? ?Post-op Assessment: Report given to RN and Post -op Vital signs reviewed and stable ? ?Post vital signs: Reviewed and stable ? ?Last Vitals:  ?Vitals Value Taken Time  ?BP 144/79 03/25/22 1819  ?Temp    ?Pulse 64 03/25/22 1826  ?Resp 15 03/25/22 1826  ?SpO2 95 % 03/25/22 1826  ?Vitals shown include unvalidated device data. ? ?Last Pain:  ?Vitals:  ? 03/25/22 1134  ?TempSrc:   ?PainSc: 0-No pain  ?   ? ?  ? ?Complications: No notable events documented. ?

## 2022-03-25 NOTE — Anesthesia Procedure Notes (Signed)
Arterial Line Insertion ?Start/End4/21/2023 2:45 PM, 03/25/2022 3:15 PM ?Performed by: Nolon Nations, MD, Janace Litten, CRNA, anesthesiologist ? Patient location: Pre-op. ?Preanesthetic checklist: patient identified, IV checked, site marked, risks and benefits discussed, surgical consent, monitors and equipment checked, pre-op evaluation, timeout performed and anesthesia consent ?Lidocaine 1% used for infiltration ?Left, radial was placed ?Catheter size: 20 G ?Hand hygiene performed  and maximum sterile barriers used  ? ?Attempts: 5 or more ?Procedure performed using ultrasound guided technique. ?Ultrasound Notes:anatomy identified, needle tip was noted to be adjacent to the nerve/plexus identified, no ultrasound evidence of intravascular and/or intraneural injection and image(s) printed for medical record ?Following insertion, dressing applied and Biopatch. ?Post procedure assessment: normal and unchanged ? ?Post procedure complications: local hematoma, second provider assisted and unsuccessful attempts. ?Patient tolerated the procedure well with no immediate complications. ? ? ? ?

## 2022-03-26 LAB — BASIC METABOLIC PANEL
Anion gap: 9 (ref 5–15)
BUN: 22 mg/dL (ref 8–23)
CO2: 21 mmol/L — ABNORMAL LOW (ref 22–32)
Calcium: 8.8 mg/dL — ABNORMAL LOW (ref 8.9–10.3)
Chloride: 106 mmol/L (ref 98–111)
Creatinine, Ser: 1.55 mg/dL — ABNORMAL HIGH (ref 0.61–1.24)
GFR, Estimated: 45 mL/min — ABNORMAL LOW (ref >60)
Glucose, Bld: 175 mg/dL — ABNORMAL HIGH (ref 70–99)
Potassium: 4.3 mmol/L (ref 3.5–5.1)
Sodium: 136 mmol/L (ref 135–145)

## 2022-03-26 LAB — GLUCOSE, CAPILLARY
Glucose-Capillary: 246 mg/dL — ABNORMAL HIGH (ref 70–99)
Glucose-Capillary: 199 mg/dL — ABNORMAL HIGH (ref 70–99)

## 2022-03-26 LAB — CBC
HCT: 36.6 % — ABNORMAL LOW (ref 39.0–52.0)
Hemoglobin: 12.7 g/dL — ABNORMAL LOW (ref 13.0–17.0)
MCH: 32.8 pg (ref 26.0–34.0)
MCHC: 34.7 g/dL (ref 30.0–36.0)
MCV: 94.6 fL (ref 80.0–100.0)
Platelets: 109 10*3/uL — ABNORMAL LOW (ref 150–400)
RBC: 3.87 MIL/uL — ABNORMAL LOW (ref 4.22–5.81)
RDW: 12.9 % (ref 11.5–15.5)
WBC: 11.4 10*3/uL — ABNORMAL HIGH (ref 4.0–10.5)
nRBC: 0 % (ref 0.0–0.2)

## 2022-03-26 LAB — LIPID PANEL
Cholesterol: 89 mg/dL (ref 0–200)
HDL: 36 mg/dL — ABNORMAL LOW (ref >40)
LDL Cholesterol: 45 mg/dL (ref 0–99)
Total CHOL/HDL Ratio: 2.5 RATIO
Triglycerides: 38 mg/dL (ref <150)
VLDL: 8 mg/dL (ref 0–40)

## 2022-03-26 MED ORDER — TAMSULOSIN HCL 0.4 MG PO CAPS
0.4000 mg | ORAL_CAPSULE | Freq: Every day | ORAL | Status: DC
  Administered 2022-03-26: 0.4 mg via ORAL
  Filled 2022-03-26: qty 1

## 2022-03-26 MED ORDER — OXYCODONE-ACETAMINOPHEN 5-325 MG PO TABS: 1.0000 | ORAL_TABLET | Freq: Four times a day (QID) | ORAL | 0 refills | Status: DC | PRN

## 2022-03-26 NOTE — Progress Notes (Addendum)
?  Progress Note ? ? ? ?03/26/2022 ?9:56 AM ?1 Day Post-Op ? ?Subjective:  no major complaints. Since foley d/c unable to void ? ? ?Vitals:  ? 03/26/22 0013 03/26/22 0504  ?BP: (!) 159/80 (!) 143/69  ?Pulse: 60 64  ?Resp: 20 15  ?Temp: (!) 97.4 ?F (36.3 ?C) 98.2 ?F (36.8 ?C)  ?SpO2: 96% (!) 0%  ? ?Physical Exam: ?Cardiac:  regular ?Lungs:  non labored ?Incisions: left supraclavicular incision is clean, dry and intact. There is ecchymosis present. No swelling or hematoma ?Extremities:  moving all extremities without deficits ?Neurologic: alert and oriented ? ?CBC ?   ?Component Value Date/Time  ? WBC 11.4 (H) 03/26/2022 0141  ? RBC 3.87 (L) 03/26/2022 0141  ? HGB 12.7 (L) 03/26/2022 0141  ? HGB 13.1 11/22/2021 1449  ? HCT 36.6 (L) 03/26/2022 0141  ? HCT 38.3 11/22/2021 1449  ? PLT 109 (L) 03/26/2022 0141  ? PLT 132 (L) 11/22/2021 1449  ? MCV 94.6 03/26/2022 0141  ? MCV 96 11/22/2021 1449  ? MCH 32.8 03/26/2022 0141  ? MCHC 34.7 03/26/2022 0141  ? RDW 12.9 03/26/2022 0141  ? RDW 12.4 11/22/2021 1449  ? LYMPHSABS 2.8 02/11/2022 1748  ? LYMPHSABS 3.0 11/22/2021 1449  ? MONOABS 0.6 02/11/2022 1748  ? EOSABS 0.6 (H) 02/11/2022 1748  ? EOSABS 0.6 (H) 11/22/2021 1449  ? BASOSABS 0.0 02/11/2022 1748  ? BASOSABS 0.1 11/22/2021 1449  ? ? ?BMET ?   ?Component Value Date/Time  ? NA 136 03/26/2022 0141  ? NA 140 11/22/2021 1449  ? K 4.3 03/26/2022 0141  ? CL 106 03/26/2022 0141  ? CO2 21 (L) 03/26/2022 0141  ? GLUCOSE 175 (H) 03/26/2022 0141  ? BUN 22 03/26/2022 0141  ? BUN 22 11/22/2021 1449  ? CREATININE 1.55 (H) 03/26/2022 0141  ? CREATININE 1.05 08/06/2014 1146  ? CALCIUM 8.8 (L) 03/26/2022 0141  ? GFRNONAA 45 (L) 03/26/2022 0141  ? GFRAA 58 (L) 11/17/2020 1508  ? ? ?INR ?   ?Component Value Date/Time  ? INR 1.2 03/18/2022 1400  ? ? ? ?Intake/Output Summary (Last 24 hours) at 03/26/2022 0956 ?Last data filed at 03/26/2022 0400 ?Gross per 24 hour  ?Intake 31 ml  ?Output 800 ml  ?Net -769 ml  ? ? ? ?Assessment/Plan:  81 y.o. male  is s/p Left TCAR 1 Day Post-Op  ? ?Neurologically intact ?Left supraclavicular incision is c/d/I, ecchymosis present. No hematoma ?H&H stable ?Scr at baseline ?Tolerating diet ?Mobilize this morning if tolerates and is able to void on his own can go later today ?He will have follow up in 1 month with carotid duplex ? ? ?Karoline Caldwell, PA-C ?[facility] ?[phone] ?03/26/2022 ?9:56 AM ? ?VASCULAR STAFF ADDENDUM: ?I have independently interviewed and examined the patient. ?I agree with the above.  ?Urinary retention post op. ?Will see if able to void - hopefully home if able to void. ?Otherwise we will need to place Foley and have him follow-up with urology as an outpatient. ? ?Yevonne Aline. Stanford Breed, MD ?[facility] [place] ?Office Phone Number: [phone] ?03/26/2022 1:11 PM ? ? ?

## 2022-03-26 NOTE — Discharge Summary (Signed)
?Carotid Discharge Summary ? ? ? ? ?Peter [name] ?11-Feb-1941 81 y.o. male ? ?[id number] ? ?Admission Date: ?03/25/2022 ? ?Discharge Date: ?03/26/2022 ? ?Physician: ?Thomes Lolling* ? ?Admission Diagnosis: ?Carotid stenosis [I65.29] ?Symptomatic carotid artery stenosis, left [I65.22] ? ?Hospital Course:  ?The patient was admitted to the hospital and taken to the operating room on 03/25/2022 and underwent left Transcarotid Revascularization by Dr. Donzetta Matters. The pt tolerated the procedure well and was transported to the PACU in good condition. ? ? By POD 1, the pt neuro status remained intact. Left supraclavicular incision intact an well appearing without hematoma. Ecchymosis present. Hemodynamically stable. Scr stable. Tolerating diet. Foley discontinued with difficulty voiding. Had to have in and out catheterization. Later bladder scan showed full bladder. Flomax ordered. Patient able to void independently without issues. ? ?The remainder of the hospital course consisted of increasing mobilization and increasing intake of solids without difficulty. ? ?Patient remained stable to discharge POD#1. Post operative urinary retention resolved. Neurologically intact. Incision intact and well appearing. PDMP reviewed and post op pain medication stent to patients pharmacy. He will continue Aspirin, statin and Brillinta. He has follow up arranged in 1 month with Dr. Donzetta Matters.  ? ? ?Recent Labs  ?  03/26/22 ?0141  ?NA 136  ?K 4.3  ?CL 106  ?CO2 21*  ?GLUCOSE 175*  ?BUN 22  ?CALCIUM 8.8*  ? ?Recent Labs  ?  03/25/22 ?2134 03/26/22 ?0141  ?WBC 9.9 11.4*  ?HGB 13.0 12.7*  ?HCT 37.8* 36.6*  ?PLT 111* 109*  ? ?No results for input(s): INR in the last 72 hours. ? ? ?Discharge Instructions   ? ? Call MD for:  difficulty breathing, headache or visual disturbances   Complete by: As directed ?  ? Call MD for:  redness, tenderness, or signs of infection (pain, swelling, redness, odor or green/yellow discharge around incision site)    Complete by: As directed ?  ? Call MD for:  severe uncontrolled pain   Complete by: As directed ?  ? Call MD for:  temperature >100.4   Complete by: As directed ?  ? Diet - low sodium heart healthy   Complete by: As directed ?  ? Discharge patient   Complete by: As directed ?  ? Once he has voided on his own and has tolerated ambulation  ? Discharge disposition: 01-Home or Self Care  ? Discharge patient date: 03/26/2022  ? Discharge wound care:   Complete by: As directed ?  ? Clean incision with mild soap and water, pat dry. Do not soak in bathtub  ? Driving Restrictions   Complete by: As directed ?  ? No driving while taking pain medication  ? Increase activity slowly   Complete by: As directed ?  ? Lifting restrictions   Complete by: As directed ?  ? No heavy lifting, pushing, pulling > 10 lbs for 2 weeks  ? ?  ? ? ?Discharge Diagnosis:  ?Carotid stenosis [I65.29] ?Symptomatic carotid artery stenosis, left [I65.22] ? ?Secondary Diagnosis: ?Patient Active Problem List  ? Diagnosis Date Noted  ? Carotid stenosis 03/25/2022  ? Symptomatic carotid artery stenosis, left 03/25/2022  ? Chronic systolic heart failure ([place]) 02/23/2022  ? Acute CVA (cerebrovascular accident) ([place]) 02/11/2022  ? 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 Chronic kidney disease, stage 3a ([place]) 02/11/2022  ? Seborrheic keratoses 06/26/2020  ? Chest pain 02/24/2020  ? NSTEMI (non-ST elevated myocardial infarction) ([place]) 02/24/2020  ? Chronic back pain 07/25/2018  ? CVA (cerebral  vascular accident) ([place]) 04/14/2018  ? Stroke-like symptoms 04/12/2018  ? CAD (coronary artery disease) 04/12/2018  ? Elevated troponin 04/12/2018  ? Claudication ([place]) 04/12/2018  ? 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  PCI- LIMA-LAD insertion site DES 08/14/14 08/15/2014  ? COPD (chronic obstructive pulmonary disease) ([place]) 08/15/2014  ? 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 Plavix allergy 08/15/2014  ? HTN (hypertension), benign 02/03/2014  ? AAA  (abdominal aortic aneurysm) without rupture ([place]) 02/03/2014  ? H/O ETOH abuse 04/02/2013  ? Bowel habit changes 04/02/2013  ? Dyslipidemia 03/13/2013  ? Type 2 diabetes mellitus ([place]) 03/13/2013  ? Pain, abdominal, nonspecific 03/13/2013  ? ?Past Medical History:  ?Diagnosis Date  ? AAA (abdominal aortic aneurysm) ([place])   ? CAD (coronary artery disease)   ? a. s/p CABG in 1997 b. stent to SVG-PDA and PTCA of LAD in 2003 c. cath in 2015 showing patent SVG-OM1-OM2, SVG-RI, SVG-D1, and LIMA-LAD with occluded SVG-PDA and severe stenosis of mid-LAD at LIMA insertion with DES placed d. 04/2018: similar results to 2015 with patent LAD stent; occlusion of small caliber D1 stenosis with medical management recommended.   ? Carotid artery disease ([place])   ? Chronic back pain 07/25/2018  ? Chronic lumbar pain uses hydrocodone intermittently  ? Chronic systolic CHF (congestive heart failure) ([place])   ? CKD (chronic kidney disease)   ? COPD (chronic obstructive pulmonary disease) ([place])   ? CVA (cerebrovascular accident) ([place])   ? 04/12/18; 02/11/22  ? Diabetes mellitus   ? diet controlled  ? Dyslipidemia   ? Hypertension   ? 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 02/14/22 TEE :Evidence of atrial level shunting detected by color flow Doppler.  ? Myocardial infarction [facility]) 1997  ? stents x2 (2003/2007)  ? ? ?Allergies as of 03/26/2022   ? ?   Reactions  ? Dye Fdc Red [red Dye] Hives  ? hives  ? Ivp Dye [iodinated Contrast Media] Hives  ? Plavix [clopidogrel] Hives  ? Unsure if dye or Plavix  ? Keflex [cephalexin] Other (See Comments)  ? hoarsness  ? ?  ? ?  ?Medication List  ?  ? ?STOP taking these medications   ? ?HYDROcodone-acetaminophen 10-325 MG tablet ?Commonly known as: NORCO ?  ? ?  ? ?TAKE these medications   ? ?Accu-Chek Guide test strip ?Generic drug: glucose blood ?USE 1 STRIP TO CHECK GLUCOSE TWICE DAILY DUE TO FLUCTUATING SUGARS ?  ?acetaminophen 500 MG tablet ?Commonly known as: TYLENOL ?Take 1,000 mg by mouth every 6  (six) hours as needed for mild pain. ?  ?Aloe Vera Juice Liqd ?Take 8 oz by mouth daily. ?  ?amoxicillin 500 MG capsule ?Commonly known as: AMOXIL ?Take 1 capsule (500 mg total) by mouth 3 (three) times daily for 10 days. ?  ?aspirin EC 81 MG tablet ?Take 1 tablet (81 mg total) by mouth daily with breakfast. ?  ?losartan 25 MG tablet ?Commonly known as: COZAAR ?Take 1 tablet (25 mg total) by mouth daily. ?  ?metoprolol succinate 25 MG 24 hr tablet ?Commonly known as: TOPROL-XL ?Take 1 tablet (25 mg total) by mouth daily. ?  ?oxyCODONE-acetaminophen 5-325 MG tablet ?Commonly known as: PERCOCET/ROXICET ?Take 1 tablet by mouth every 6 (six) hours as needed for moderate pain. ?  ?rosuvastatin 40 MG tablet ?Commonly known as: CRESTOR ?Take 1 tablet (40 mg total) by mouth daily. ?  ?ticagrelor 60 MG Tabs tablet ?Commonly known as: Brilinta ?Take 1 tablet (60 mg total)  by mouth 2 (two) times daily. ?  ?vitamin E 180 MG (400 UNITS) capsule ?Take 400 Units by mouth daily. ?  ? ?  ? ?  ?  ? ? ?  ?Discharge Care Instructions  ?(From admission, onward)  ?  ? ? ?  ? ?  Start     Ordered  ? 03/26/22 0000  Discharge wound care:       ?Comments: Clean incision with mild soap and water, pat dry. Do not soak in bathtub  ? 03/26/22 1122  ? ?  ?  ? ?  ? ? ? ?Discharge Instructions: ? ? ?[facility] [place] ?Discharge Instructions Carotid Endarterectomy (CEA) ? ?Please refer to the following instructions for your post-procedure care. Your surgeon or physician assistant will discuss any changes with you. ? ?Activity ? ?You are encouraged to walk as much as you can. You can slowly return to normal activities but must avoid strenuous activity and heavy lifting until your doctor tell you it's OK. Avoid activities such as vacuuming or swinging a golf club. You can drive after one week if you are comfortable and you are no longer taking prescription pain medications. It is normal to feel tired for serval weeks after  your surgery. It is also normal to have difficulty with sleep habits, eating, and bowel movements after surgery. These will go away with time. ? ?Bathing/Showering ? ?You may shower after you come home. Do not soa

## 2022-03-26 NOTE — Discharge Instructions (Signed)
   [facility] [place]  Discharge Instructions   Carotid Surgery  Please refer to the following instructions for your post-procedure care. Your surgeon or physician assistant will discuss any changes with you.  Activity  You are encouraged to walk as much as you can. You can slowly return to normal activities but must avoid strenuous activity and heavy lifting until your doctor tell you it's okay. Avoid activities such as vacuuming or swinging a golf club. You can drive after one week if you are comfortable and you are no longer taking prescription pain medications. It is normal to feel tired for serval weeks after your surgery. It is also normal to have difficulty with sleep habits, eating, and bowel movements after surgery. These will go away with time.  Bathing/Showering  Shower daily after you go home. Do not soak in a bathtub, hot tub, or swim until the incision heals completely.  Incision Care  Shower every day. Clean your incision with mild soap and water. Pat the area dry with a clean towel. You do not need a bandage unless otherwise instructed. Do not apply any ointments or creams to your incision. You may have skin glue on your incision. Do not peel it off. It will come off on its own in about one week. Your incision may feel thickened and raised for several weeks after your surgery. This is normal and the skin will soften over time.   For Men Only: It's okay to shave around the incision but do not shave the incision itself for 2 weeks. It is common to have numbness under your chin that could last for several months.  Diet  Resume your normal diet. There are no special food restrictions following this procedure. A low fat/low cholesterol diet is recommended for all patients with vascular disease. In order to heal from your surgery, it is CRITICAL to get adequate nutrition. Your body requires vitamins, minerals, and protein. Vegetables are the best source of  vitamins and minerals. Vegetables also provide the perfect balance of protein. Processed food has little nutritional value, so try to avoid this.  Medications  Resume taking all of your medications unless your doctor or physician assistant tells you not to. If your incision is causing pain, you may take over-the- counter pain relievers such as acetaminophen (Tylenol). If you were prescribed a stronger pain medication, please be aware these medications can cause nausea and constipation. Prevent nausea by taking the medication with a snack or meal. Avoid constipation by drinking plenty of fluids and eating foods with a high amount of fiber, such as fruits, vegetables, and grains.   Do not take Tylenol if you are taking prescription pain medications.  Follow Up  Our office will schedule a follow up appointment 2-3 weeks following discharge.  Please call us immediately for any of the following conditions  . Increased pain, redness, drainage (pus) from your incision site. . Fever of 101 degrees or higher. . If you should develop stroke (slurred speech, difficulty swallowing, weakness on one side of your body, loss of vision) you should call 911 and go to the nearest emergency room. .  Reduce your risk of vascular disease:  . Stop smoking. If you would like help call [facility] at 1-800-QUIT-NOW (1-800-784-8669) or [place] at 336-586-4000. . Manage your cholesterol . Maintain a desired weight . Control your diabetes . Keep your blood pressure down .  If you have any questions, please call the office at [phone]. 

## 2022-03-26 NOTE — Progress Notes (Signed)
PHARMACIST LIPID MONITORING ? ? ?Peter [name] is a 81 y.o. male admitted on 03/25/2022 with ICA stenosis.  Pharmacy has been consulted to optimize lipid-lowering therapy with the indication of secondary prevention for clinical ASCVD. ? ?Recent Labs: ? ?Lipid Panel (last 6 months):   ?Lab Results  ?Component Value Date  ? CHOL 89 03/26/2022  ? TRIG 38 03/26/2022  ? HDL 36 (L) 03/26/2022  ? CHOLHDL 2.5 03/26/2022  ? VLDL 8 03/26/2022  ? LDLCALC 45 03/26/2022  ? ? ?Hepatic function panel (last 6 months):   ?Lab Results  ?Component Value Date  ? AST 20 03/18/2022  ? ALT 16 03/18/2022  ? ALKPHOS 67 03/18/2022  ? BILITOT 0.7 03/18/2022  ? ? ?SCr (since admission):   ?Serum creatinine: 1.55 mg/dL (H) 03/26/22 0141 ?Estimated creatinine clearance: 42.4 mL/min (A) ? ?Current therapy and lipid therapy tolerance ?Current lipid-lowering therapy: rosuvastatin 40 ?Previous lipid-lowering therapies (if applicable): rosuvastatin 40 ?Documented or reported allergies or intolerances to lipid-lowering therapies (if applicable): none ? ?Assessment:   ?81 yo M on PTA rosuvastatin 40 mg daily. LDL on 4/22 indicates LDL of 45. Given LDL level and currently on high intensity statin, no changes indicated in therapy at this time.  ? ?Plan:   ? ?1.Statin intensity (high intensity recommended for all patients regardless of the LDL):  No statin changes. The patient is already on a high intensity statin. ? ?2.Add ezetimibe (if any one of the following):   Not indicated at this time. ? ?3.Refer to lipid clinic:   No ? ?4.Follow-up with:  Primary care provider - Kathyrn Drown, MD ? ?5.Follow-up labs after discharge:  No changes in lipid therapy, repeat a lipid panel in one year.    ? ? ? ?Aleene Davidson, PharmD ?03/26/2022, 7:55 AM  ?

## 2022-03-26 NOTE — Progress Notes (Signed)
Physician notified of patient complaint of inability to void. Orders for straight cath given.  ?Will continue to monitor. ?

## 2022-03-27 NOTE — Anesthesia Postprocedure Evaluation (Signed)
Anesthesia Post Note ? ?Patient: Peter [name] ? ?Procedure(s) Performed: LEFT TRANCAROTID ARTERY REVASCULARIZATION USING 24m X 416mENROUTE STENT SYSTEM (Left) ?ULTRASOUND GUIDANCE FOR VASCULAR ACCESS ? ?  ? ?Patient location during evaluation: PACU ?Anesthesia Type: General ?Level of consciousness: patient cooperative and awake ?Pain management: pain level controlled ?Vital Signs Assessment: post-procedure vital signs reviewed and stable ?Respiratory status: spontaneous breathing, nonlabored ventilation, respiratory function stable and patient connected to nasal cannula oxygen ?Cardiovascular status: blood pressure returned to baseline and stable ?Postop Assessment: no apparent nausea or vomiting ?Anesthetic complications: no ? ? ?No notable events documented. ? ?Last Vitals:  ?Vitals:  ? 03/26/22 1135 03/26/22 1521  ?BP: 124/70 122/63  ?Pulse: 73 72  ?Resp: 18 16  ?Temp: 36.7 ?C 36.7 ?C  ?SpO2: 95% 99%  ?  ?Last Pain:  ?Vitals:  ? 03/26/22 1135  ?TempSrc: Oral  ?PainSc:   ? ? ?  ?  ?  ?  ?  ?  ? ?[name] ? ? ? ? ?

## 2022-03-28 ENCOUNTER — Encounter (HOSPITAL_COMMUNITY): Payer: Self-pay | Admitting: Vascular Surgery

## 2022-03-28 LAB — GLUCOSE, CAPILLARY: Glucose-Capillary: 260 mg/dL — ABNORMAL HIGH (ref 70–99)

## 2022-03-30 ENCOUNTER — Encounter: Payer: Self-pay | Admitting: Family Medicine

## 2022-03-31 NOTE — Telephone Encounter (Signed)
Appointment cancelled as requested

## 2022-04-26 ENCOUNTER — Other Ambulatory Visit: Payer: Self-pay

## 2022-04-26 DIAGNOSIS — I6529 Occlusion and stenosis of unspecified carotid artery: Secondary | ICD-10-CM

## 2022-04-27 ENCOUNTER — Ambulatory Visit: Payer: Medicare Other | Admitting: Family Medicine

## 2022-05-04 ENCOUNTER — Ambulatory Visit (HOSPITAL_COMMUNITY)
Admission: RE | Admit: 2022-05-04 | Discharge: 2022-05-04 | Disposition: A | Discharge status: Home / Self Care | Payer: Medicare Other | Source: Ambulatory Visit | Attending: Vascular Surgery | Admitting: Vascular Surgery

## 2022-05-04 ENCOUNTER — Encounter: Payer: Self-pay | Admitting: Vascular Surgery

## 2022-05-04 ENCOUNTER — Ambulatory Visit (INDEPENDENT_AMBULATORY_CARE_PROVIDER_SITE_OTHER): Payer: Medicare Other | Admitting: Vascular Surgery

## 2022-05-04 VITALS — BP 150/85 | HR 62 | Temp 97.9°F | Resp 20 | Ht 72.0 in | Wt 172.9 lb

## 2022-05-04 DIAGNOSIS — I6529 Occlusion and stenosis of unspecified carotid artery: Secondary | ICD-10-CM | POA: Diagnosis not present

## 2022-05-04 DIAGNOSIS — I714 Abdominal aortic aneurysm, without rupture, unspecified: Secondary | ICD-10-CM

## 2022-05-04 NOTE — Progress Notes (Signed)
Patient ID: Peter [name], male   DOB: 1941/01/22, 81 y.o.   MRN: [id number]  Reason for Consult: Follow-up   Referred by Kathyrn Drown, MD  Subjective:     HPI:  Peter [name] is a 81 y.o. 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  After the TCAR he did have significant bruising.  He has now mostly recovered from this although he still feels a bit worn down.  He is now here to discuss right-sided TCAR.  Past Medical History:  Diagnosis Date   AAA (abdominal aortic aneurysm) ([place])    CAD (coronary artery disease)    a. s/p CABG in 1997 b. stent to SVG-PDA and PTCA of LAD in 2003 c. cath in 2015 showing patent SVG-OM1-OM2, SVG-RI, SVG-D1, and LIMA-LAD with occluded SVG-PDA and severe stenosis of mid-LAD at LIMA insertion with DES placed d. 04/2018: similar results to 2015 with patent LAD stent; occlusion of small caliber D1 stenosis with medical management recommended.    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   Myocardial infarction [facility]) 1997   stents x2 (2003/2007)   Family History  Problem Relation Age of Onset   Other Father        deceased after ?TCS or barium enema, age 54s   Hypertension Father    Heart attack Mother    Colon cancer Neg Hx    Liver disease Neg Hx    Past Surgical History:  Procedure Laterality Date   CORONARY ANGIOPLASTY  2003/2007   2 stents   CORONARY ANGIOPLASTY WITH STENT PLACEMENT   08/14/2014   DES to LIMA-LAD insertion   CORONARY ARTERY BYPASS GRAFT  12/06/1995   7 vessels   CORONARY STENT INTERVENTION N/A 02/24/2020   Procedure: CORONARY STENT INTERVENTION;  Surgeon: Sherren Mocha, MD;  Location: [place] [facility];  Service: Cardiovascular;  Laterality: N/A;   CORONARY STENT PLACEMENT  08/15/2014   MID LAD  DES  by Dr Leslye Peer ANGIOGRAPHY N/A 02/24/2020   Procedure: Remus Blake ANGIOGRAPHY;  Surgeon: Sherren Mocha, MD;  Location: [place] CV [facility];  Service: Cardiovascular;  Laterality: N/A;   CYSTECTOMY  12/06/2003   top of head-[place]   EAR CYST EXCISION  07/09/2012   Procedure: CYST REMOVAL;  Surgeon: Jamesetta So, MD;  Location: [facility];  Service: General;  Laterality: N/A;   EYE SURGERY Right    cataract removal and then another surgery after   HERNIA REPAIR     1980's inguinal Left side   LEFT HEART CATH AND CORS/GRAFTS ANGIOGRAPHY N/A 04/16/2018   Procedure: LEFT HEART CATH AND CORS/GRAFTS ANGIOGRAPHY;  Surgeon: Martinique, Peter M, MD;  Location: [place] [facility];  Service: Cardiovascular;  Laterality: N/A;   LEFT HEART CATHETERIZATION WITH CORONARY/GRAFT ANGIOGRAM N/A 08/14/2014   Procedure: LEFT HEART CATHETERIZATION WITH Beatrix Fetters;  Surgeon: Blane Ohara, MD;  Location: [facility];  Service:  Cardiovascular;  Laterality: N/A;   TEE WITHOUT CARDIOVERSION N/A 02/14/2022   Procedure: TRANSESOPHAGEAL ECHOCARDIOGRAM (TEE);  Surgeon: Freada Bergeron, MD;  Location: [facility];  Service: Cardiovascular;  Laterality: N/A;   TRANSCAROTID ARTERY REVASCULARIZATION  Left 03/25/2022   Procedure: LEFT TRANCAROTID ARTERY REVASCULARIZATION USING 82m X 466mENROUTE [place] Surgeon: CaWaynetta SandyMD;  [place];  Laterality: Left;   ULTRASOUND GUIDANCE FOR VASCULAR ACCESS N/A 03/25/2022   Procedure: ULTRASOUND GUIDANCE FOR VASCULAR ACCESS;  Surgeon: CaWaynetta SandyMD;   [place] Service: Vascular;  Laterality: N/A;    Short Social History:  Social History   Tobacco Use   Smoking status: Former    Packs/day: 3.00    Years: 40.00    Pack years: 120.00    Types: Cigarettes    Quit date: 07/02/2000    Years since quitting: 21.8   Smokeless tobacco: Never  Substance Use Topics   Alcohol use: No    Alcohol/week: 10.0 standard drinks    Types: 10 Cans of beer per week    Comment: quit 3 months (01/2013). 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    Allergies  Allergen Reactions   Dye Fdc Red [Red Dye] Hives    hives   Ivp Dye [Iodinated Contrast Media] Hives   Plavix [Clopidogrel] Hives    Unsure if dye or Plavix   Keflex [Cephalexin] Other (See Comments)    hoarsness    Current Outpatient Medications  Medication Sig Dispense Refill   ACCU-CHEK GUIDE test strip USE 1 STRIP TO CHECK GLUCOSE TWICE DAILY DUE TO FLUCTUATING SUGARS 100 each 0   acetaminophen (TYLENOL) 500 MG tablet Take 1,000 mg by mouth every 6 (six) hours as needed for mild pain.     Aloe Vera Juice LIQD Take 8 oz by mouth daily.     aspirin EC 81 MG tablet Take 1 tablet (81 mg total) by mouth daily with breakfast. 90 tablet 3   losartan (COZAAR) 25 MG tablet Take 1 tablet (25 mg total) by mouth daily. 90 tablet 3   metoprolol succinate (TOPROL-XL) 25 MG 24 hr tablet Take 1 tablet (25 mg total) by mouth daily. 90 tablet 3   oxyCODONE-acetaminophen (PERCOCET/ROXICET) 5-325 MG tablet Take 1 tablet by mouth every 6 (six) hours as needed for moderate pain. 16 tablet 0   rosuvastatin (CRESTOR) 40 MG tablet Take 1 tablet (40 mg total) by mouth daily. 90 tablet 3   ticagrelor (BRILINTA) 60 MG TABS tablet Take 1 tablet (60 mg total) by mouth 2 (two) times daily. 60 tablet 11   vitamin E 180 MG (400 UNITS) capsule Take 400 Units by mouth daily.     No current facility-administered medications for this visit.    Review of Systems  Constitutional: Positive  for fatigue.  Hematologic: Positive for bruises/bleeds easily.       Objective:  Objective   Vitals:   05/04/22 1530  BP: (!) 150/85  Pulse: 62  Resp: 20  Temp: 97.9 F (36.6 C)  SpO2: 98%  Weight: 172 lb 14.4 oz (78.4 kg)  Height: 6' (1.829 m)   Body mass index is 23.45 kg/m.  Physical Exam Neck:     Vascular: No carotid bruit.     Comments: Well-healed left neck incision Cardiovascular:     Rate and Rhythm: Normal rate.  Neurological:     General: No focal deficit present.  Mental Status: He is alert and oriented to person, place, and time.  Psychiatric:        Mood and Affect: Mood normal.    Data: Right Carotid Findings:  +----------+--------+--------+--------+-------------------------+--------+            PSV cm/sEDV cm/sStenosisPlaque Description       Comments  +----------+--------+--------+--------+-------------------------+--------+  CCA Prox  83      10              homogeneous                        +----------+--------+--------+--------+-------------------------+--------+  CCA Mid   67      13              homogeneous                        +----------+--------+--------+--------+-------------------------+--------+  CCA Distal43      9               heterogenous                       +----------+--------+--------+--------+-------------------------+--------+  ICA Prox  437     138     80-99%  heterogenous and calcific          +----------+--------+--------+--------+-------------------------+--------+  ICA Mid   97      23                                                 +----------+--------+--------+--------+-------------------------+--------+  ICA Distal73      19                                                 +----------+--------+--------+--------+-------------------------+--------+  ECA       94      4               heterogenous                        +----------+--------+--------+--------+-------------------------+--------+   +----------+--------+-------+----------------+-------------------+            PSV cm/sEDV cmsDescribe        Arm Pressure (mmHG)  +----------+--------+-------+----------------+-------------------+  Subclavian201            Multiphasic, [id number]                  +----------+--------+-------+----------------+-------------------+   +---------+--------+--+--------+--+---------+  VertebralPSV cm/s55EDV cm/s11Antegrade  +---------+--------+--+--------+--+---------+       Left Carotid Findings:  +----------+--------+--------+--------+------------------+--------+            PSV cm/sEDV cm/sStenosisPlaque DescriptionComments  +----------+--------+--------+--------+------------------+--------+  CCA Prox  105     17                                          +----------+--------+--------+--------+------------------+--------+  CCA Mid   81      15              heterogenous                +----------+--------+--------+--------+------------------+--------+  CCA Distal78      18                                          +----------+--------+--------+--------+------------------+--------+  ICA Prox  60      17      Normal                              +----------+--------+--------+--------+------------------+--------+  ICA Mid   73      21                                          +----------+--------+--------+--------+------------------+--------+  ICA Distal89      29                                          +----------+--------+--------+--------+------------------+--------+  ECA       56                                                  +----------+--------+--------+--------+------------------+--------+   +----------+--------+--------+----------------+-------------------+            PSV cm/sEDV cm/sDescribe        Arm Pressure (mmHG)   +----------+--------+--------+----------------+-------------------+  Subclavian203             Multiphasic, [id number]                  +----------+--------+--------+----------------+-------------------+   +---------+--------+--+--------+-+---------+  VertebralPSV cm/s30EDV cm/s5Antegrade  +---------+--------+--+--------+-+---------+       Left Stent(s):  +---------------+--+--++++  Prox to Stent  7217  +---------------+--+--++++  Proximal Stent 7314  +---------------+--+--++++  Mid Stent      6118  +---------------+--+--++++  Distal Stent   7321  +---------------+--+--++++  Distal to [id number]  +---------------+--+--++++                Summary:  Right Carotid: Velocities in the right ICA are consistent with a 80-99%                 stenosis.   Left Carotid: Patent stent with no evidence of restenosis.   Vertebrals:  Bilateral vertebral arteries demonstrate antegrade flow.  Subclavians: Normal flow hemodynamics were seen in bilateral subclavian               arteries.  Assessment/Plan:     81 year old male status post left transcarotid artery stenting now plan for right transcarotid artery stenting.  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  I have also offered referral to an academic institution for further evaluation of his aneurysm although I think he is not a good candidate for fenestrated repair upon reviewing his CT scan.  Either way we will get him scheduled for right TCAR on a nondialysis day in the near future.     Waynetta Sandy MD [facility]

## 2022-05-09 ENCOUNTER — Other Ambulatory Visit: Payer: Self-pay | Admitting: Family Medicine

## 2022-05-10 DIAGNOSIS — H9312 Tinnitus, left ear: Secondary | ICD-10-CM | POA: Diagnosis not present

## 2022-05-10 DIAGNOSIS — H903 Sensorineural hearing loss, bilateral: Secondary | ICD-10-CM | POA: Diagnosis not present

## 2022-05-17 ENCOUNTER — Other Ambulatory Visit: Payer: Self-pay

## 2022-05-17 DIAGNOSIS — I6521 Occlusion and stenosis of right carotid artery: Secondary | ICD-10-CM

## 2022-05-18 ENCOUNTER — Ambulatory Visit: Payer: Medicare Other | Admitting: Vascular Surgery

## 2022-05-19 MED ORDER — PREDNISONE 50 MG PO TABS: ORAL_TABLET | ORAL | 0 refills | Status: DC

## 2022-05-19 MED ORDER — DIPHENHYDRAMINE HCL 50 MG PO CAPS: ORAL_CAPSULE | ORAL | 0 refills | Status: DC

## 2022-05-19 NOTE — Addendum Note (Signed)
Addended by: Nicholas Lose on: 05/19/2022 02:12 PM   Modules accepted: Orders

## 2022-06-02 NOTE — Pre-Procedure Instructions (Addendum)
Surgical Instructions    Your procedure is scheduled on Friday 06/10/22.   Report to [facility] Main Entrance "A" at 08:30 A.M., then check in with the Admitting office.  Call this number if you have problems the morning of surgery:  [phone]   If you have any questions prior to your surgery date call [phone]: Open Monday-Friday 8am-4pm    Remember:  Do not eat or drink after midnight the night before your surgery    Take these medicines the morning of surgery with A SIP OF WATER:   metoprolol succinate (TOPROL-XL)   rosuvastatin (CRESTOR)    Take these medicines if needed:   acetaminophen (TYLENOL)  HYDROcodone-acetaminophen ([place])   Please follow your surgeon's instructions regarding Aspirin and ticagrelor (BRILINTA). If you have not received instructions then please contact your surgeon's office for instructions.  As of today, STOP taking any Aspirin (unless otherwise instructed by your surgeon) Aleve, Naproxen, Ibuprofen, Motrin, Advil, Goody's, BC's, all herbal medications, fish oil, and all vitamins.  WHAT DO I DO ABOUT MY DIABETES MEDICATION?   Do not take oral diabetes medicines (pills) the morning of surgery.  HOW TO MANAGE YOUR DIABETES BEFORE AND AFTER SURGERY  Why is it important to control my blood sugar before and after surgery? Improving blood sugar levels before and after surgery helps healing and can limit problems. A way of improving blood sugar control is eating a healthy diet by:  Eating less sugar and carbohydrates  Increasing activity/exercise  Talking with your doctor about reaching your blood sugar goals High blood sugars (greater than 180 mg/dL) can raise your risk of infections and slow your recovery, so you will need to focus on controlling your diabetes during the weeks before surgery. Make sure that the doctor who takes care of your diabetes knows about your planned surgery including the date and location.  How do I manage my blood  sugar before surgery? Check your blood sugar at least 4 times a day, starting 2 days before surgery, to make sure that the level is not too high or low.  Check your blood sugar the morning of your surgery when you wake up and every 2 hours until you get to the Short Stay unit.  If your blood sugar is less than 70 mg/dL, you will need to treat for low blood sugar: Do not take insulin. Treat a low blood sugar (less than 70 mg/dL) with  cup of clear juice (cranberry or apple), 4 glucose tablets, OR glucose gel. Recheck blood sugar in 15 minutes after treatment (to make sure it is greater than 70 mg/dL). If your blood sugar is not greater than 70 mg/dL on recheck, call [phone] for further instructions. Report your blood sugar to the short stay nurse when you get to Short Stay.  If you are admitted to the hospital after surgery: Your blood sugar will be checked by the staff and you will probably be given insulin after surgery (instead of oral diabetes medicines) to make sure you have good blood sugar levels. The goal for blood sugar control after surgery is 80-180 mg/dL.           Do not wear jewelry or makeup Do not wear lotions, powders, perfumes/colognes, or deodorant. Do not shave 48 hours prior to surgery.  Men may shave face and neck. Do not bring valuables to the hospital. Do not wear nail polish, gel polish, artificial nails, or any other type of covering on natural nails (fingers and toes) If  you have artificial nails or gel coating that need to be removed by a nail salon, please have this removed prior to surgery. Artificial nails or gel coating may interfere with anesthesia's ability to adequately monitor your vital signs.  [facility] is not responsible for any belongings or valuables. .   Do NOT Smoke (Tobacco/Vaping)  24 hours prior to your procedure  If you use a CPAP at night, you may bring your mask for your overnight stay.   Contacts, glasses, hearing aids, dentures or  partials may not be worn into surgery, please bring cases for these belongings   For patients admitted to the hospital, discharge time will be determined by your treatment team.   Patients discharged the day of surgery will not be allowed to drive home, and someone needs to stay with them for 24 hours.   SURGICAL WAITING ROOM VISITATION Patients having surgery or a procedure in a hospital may have two support people. Children under the age of 55 must have an adult with them who is not the patient. They may stay in the waiting area during the procedure and may switch out with other visitors. If the patient needs to stay at the hospital during part of their recovery, the visitor guidelines for inpatient rooms apply.  Please refer to the [facility] website for the visitor guidelines for Inpatients (after your surgery is over and you are in a regular room).       Special instructions:    Oral Hygiene is also important to reduce your risk of infection.  Remember - BRUSH YOUR TEETH THE MORNING OF SURGERY WITH YOUR REGULAR TOOTHPASTE   [facility]- Preparing For Surgery  Before surgery, you can play an important role. Because skin is not sterile, your skin needs to be as free of germs as possible. You can reduce the number of germs on your skin by washing with CHG (chlorahexidine gluconate) Soap before surgery.  CHG is an antiseptic cleaner which kills germs and bonds with the skin to continue killing germs even after washing.     Please do not use if you have an allergy to CHG or antibacterial soaps. If your skin becomes reddened/irritated stop using the CHG.  Do not shave (including legs and underarms) for at least 48 hours prior to first CHG shower. It is OK to shave your face.  Please follow these instructions carefully.     Shower the NIGHT BEFORE SURGERY and the MORNING OF SURGERY with CHG Soap.   If you chose to wash your hair, wash your hair first as usual with your normal  shampoo. After you shampoo, rinse your hair and body thoroughly to remove the shampoo.  Then [employer] and genitals (private parts) with your normal soap and rinse thoroughly to remove soap.  After that Use CHG Soap as you would any other liquid soap. You can apply CHG directly to the skin and wash gently with a scrungie or a clean washcloth.   Apply the CHG Soap to your body ONLY FROM THE NECK DOWN.  Do not use on open wounds or open sores. Avoid contact with your eyes, ears, mouth and genitals (private parts). Wash Face and genitals (private parts)  with your normal soap.   Wash thoroughly, paying special attention to the area where your surgery will be performed.  Thoroughly rinse your body with warm water from the neck down.  DO NOT shower/wash with your normal soap after using and rinsing off the CHG Soap.  Pat yourself dry with a CLEAN TOWEL.  Wear CLEAN PAJAMAS to bed the night before surgery  Place CLEAN SHEETS on your bed the night before your surgery  DO NOT SLEEP WITH PETS.   Day of Surgery:  Take a shower with CHG soap. Wear Clean/Comfortable clothing the morning of surgery Do not apply any deodorants/lotions.   Remember to brush your teeth WITH YOUR REGULAR TOOTHPASTE.    If you received a COVID test during your pre-op visit, it is requested that you wear a mask when out in public, stay away from anyone that may not be feeling well, and notify your surgeon if you develop symptoms. If you have been in contact with anyone that has tested positive in the last 10 days, please notify your surgeon.    Please read over the following fact sheets that you were given.

## 2022-06-03 ENCOUNTER — Encounter (HOSPITAL_COMMUNITY)
Admission: RE | Admit: 2022-06-03 | Discharge: 2022-06-03 | Disposition: A | Discharge status: Home / Self Care | Payer: Medicare Other | Source: Ambulatory Visit | Attending: Vascular Surgery | Admitting: Vascular Surgery

## 2022-06-03 ENCOUNTER — Encounter (HOSPITAL_COMMUNITY): Payer: Self-pay

## 2022-06-03 ENCOUNTER — Other Ambulatory Visit: Payer: Self-pay

## 2022-06-03 VITALS — BP 161/66 | HR 58 | Temp 97.6°F | Resp 17 | Ht 72.0 in | Wt 173.9 lb

## 2022-06-03 DIAGNOSIS — Z01818 Encounter for other preprocedural examination: Secondary | ICD-10-CM

## 2022-06-03 DIAGNOSIS — E1122 Type 2 diabetes mellitus with diabetic chronic kidney disease: Secondary | ICD-10-CM | POA: Insufficient documentation

## 2022-06-03 DIAGNOSIS — I6521 Occlusion and stenosis of right carotid artery: Secondary | ICD-10-CM | POA: Diagnosis not present

## 2022-06-03 DIAGNOSIS — N189 Chronic kidney disease, unspecified: Secondary | ICD-10-CM | POA: Diagnosis not present

## 2022-06-03 DIAGNOSIS — I252 Old myocardial infarction: Secondary | ICD-10-CM | POA: Insufficient documentation

## 2022-06-03 DIAGNOSIS — Z8673 Personal history of transient ischemic attack (TIA), and cerebral infarction without residual deficits: Secondary | ICD-10-CM | POA: Insufficient documentation

## 2022-06-03 DIAGNOSIS — I25810 Atherosclerosis of coronary artery bypass graft(s) without angina pectoris: Secondary | ICD-10-CM | POA: Diagnosis not present

## 2022-06-03 DIAGNOSIS — I2510 Atherosclerotic heart disease of native coronary artery without angina pectoris: Secondary | ICD-10-CM | POA: Diagnosis not present

## 2022-06-03 DIAGNOSIS — G8929 Other chronic pain: Secondary | ICD-10-CM | POA: Insufficient documentation

## 2022-06-03 DIAGNOSIS — Z8679 Personal history of other diseases of the circulatory system: Secondary | ICD-10-CM | POA: Insufficient documentation

## 2022-06-03 DIAGNOSIS — Z01812 Encounter for preprocedural laboratory examination: Secondary | ICD-10-CM | POA: Diagnosis not present

## 2022-06-03 DIAGNOSIS — E119 Type 2 diabetes mellitus without complications: Secondary | ICD-10-CM | POA: Diagnosis not present

## 2022-06-03 DIAGNOSIS — I130 Hypertensive heart and chronic kidney disease with heart failure and stage 1 through stage 4 chronic kidney disease, or unspecified chronic kidney disease: Secondary | ICD-10-CM | POA: Insufficient documentation

## 2022-06-03 HISTORY — DX: Unspecified osteoarthritis, unspecified site: M19.90

## 2022-06-03 LAB — COMPREHENSIVE METABOLIC PANEL
ALT: 16 U/L (ref 0–44)
AST: 24 U/L (ref 15–41)
Albumin: 3.8 g/dL (ref 3.5–5.0)
Alkaline Phosphatase: 61 U/L (ref 38–126)
Anion gap: 9 (ref 5–15)
BUN: 17 mg/dL (ref 8–23)
CO2: 26 mmol/L (ref 22–32)
Calcium: 9.1 mg/dL (ref 8.9–10.3)
Chloride: 104 mmol/L (ref 98–111)
Creatinine, Ser: 1.71 mg/dL — ABNORMAL HIGH (ref 0.61–1.24)
GFR, Estimated: 40 mL/min — ABNORMAL LOW (ref >60)
Glucose, Bld: 189 mg/dL — ABNORMAL HIGH (ref 70–99)
Potassium: 4 mmol/L (ref 3.5–5.1)
Sodium: 139 mmol/L (ref 135–145)
Total Bilirubin: 0.9 mg/dL (ref 0.3–1.2)
Total Protein: 6.9 g/dL (ref 6.5–8.1)

## 2022-06-03 LAB — URINALYSIS, ROUTINE W REFLEX MICROSCOPIC
Bilirubin Urine: NEGATIVE
Glucose, UA: NEGATIVE mg/dL
Ketones, ur: NEGATIVE mg/dL
Leukocytes,Ua: NEGATIVE
Nitrite: NEGATIVE
Protein, ur: 30 mg/dL — AB
Specific Gravity, Urine: 1.015 (ref 1.005–1.030)
pH: 5 (ref 5.0–8.0)

## 2022-06-03 LAB — CBC
HCT: 39.8 % (ref 39.0–52.0)
Hemoglobin: 13.8 g/dL (ref 13.0–17.0)
MCH: 33.4 pg (ref 26.0–34.0)
MCHC: 34.7 g/dL (ref 30.0–36.0)
MCV: 96.4 fL (ref 80.0–100.0)
Platelets: 128 10*3/uL — ABNORMAL LOW (ref 150–400)
RBC: 4.13 MIL/uL — ABNORMAL LOW (ref 4.22–5.81)
RDW: 13.2 % (ref 11.5–15.5)
WBC: 7.4 10*3/uL (ref 4.0–10.5)
nRBC: 0 % (ref 0.0–0.2)

## 2022-06-03 LAB — HEMOGLOBIN A1C
Hgb A1c MFr Bld: 6.4 % — ABNORMAL HIGH (ref 4.8–5.6)
Mean Plasma Glucose: 136.98 mg/dL

## 2022-06-03 LAB — PROTIME-INR
INR: 1.2 (ref 0.8–1.2)
Prothrombin Time: 15 seconds (ref 11.4–15.2)

## 2022-06-03 LAB — TYPE AND SCREEN
ABO/RH(D): O POS
Antibody Screen: NEGATIVE

## 2022-06-03 LAB — GLUCOSE, CAPILLARY: Glucose-Capillary: 157 mg/dL — ABNORMAL HIGH (ref 70–99)

## 2022-06-03 LAB — SURGICAL PCR SCREEN
MRSA, PCR: NEGATIVE
Staphylococcus aureus: NEGATIVE

## 2022-06-03 LAB — APTT: aPTT: 38 seconds — ABNORMAL HIGH (ref 24–36)

## 2022-06-03 NOTE — Progress Notes (Signed)
PCP - Dr. Sallee Lange Cardiologist - Dr. Carlyle Dolly  PPM/ICD - denies   Chest x-ray - 02/24/20 EKG - 02/11/22 Stress Test - 10+ years ago per pt, no abnormalities ECHO - 02/12/22 Cardiac Cath - 02/24/20  Sleep Study - denies  DM- Type 2 Fasting Blood Sugar - 220-230 Checks Blood Sugar 1-2 times a day  ASA/Blood Thinner Instructions: Continue ASA and Brilinta thru DOS per surgeon   ERAS Protcol - no, NPO   COVID TEST- n/a   Anesthesia review: yes, cardiac hx  Patient denies shortness of breath, fever, cough and chest pain at PAT appointment   All instructions explained to the patient, with a verbal understanding of the material. Patient agrees to go over the instructions while at home for a better understanding. The opportunity to ask questions was provided.

## 2022-06-06 NOTE — Anesthesia Preprocedure Evaluation (Addendum)
Anesthesia Evaluation  Patient identified by MRN, date of birth, ID band Patient awake    Reviewed: Allergy & Precautions, NPO status , Patient's Chart, lab work & pertinent test results, reviewed documented beta blocker date and time   History of Anesthesia Complications Negative for: history of anesthetic complications  Airway Mallampati: II  TM Distance: >3 FB Neck ROM: Full    Dental  (+) Edentulous Upper, Partial Lower   Pulmonary COPD, former smoker,    Pulmonary exam normal        Cardiovascular hypertension, Pt. on medications and Pt. on home beta blockers + CAD, + Past MI, + Cardiac Stents, + CABG and + Peripheral Vascular Disease  Normal cardiovascular exam   '23 Carotid US - Right Carotid: Velocities in the right ICA are consistent with a 80-99% stenosis.  Left Carotid: Patent stent with no evidence of restenosis.   '23 TEE - EF 50 to 55%. Left atrial size was moderately dilated. Mild mitral valve regurgitation. Aortic valve regurgitation is mild. There is mild dilatation of the aortic root, measuring 42 mm. There is Moderate (Grade III) plaque. Evidence of atrial level shunting detected by color flow Doppler. Agitated saline contrast bubble study was positive with shunting observed within 3-6 cardiac cycles suggestive of interatrial shunt.   S/p left TCAR on 03/25/22 5.3 cm AAA    Neuro/Psych CVA, No Residual Symptoms negative psych ROS   GI/Hepatic negative GI ROS, (+)     substance abuse  alcohol use,   Endo/Other  diabetes, Well Controlled, Type 2  Renal/GU CRFRenal disease     Musculoskeletal  (+) Arthritis ,   Abdominal   Peds  Hematology  On ticagrelor Plt 128k    Anesthesia Other Findings   Reproductive/Obstetrics                           Anesthesia Physical Anesthesia Plan  ASA: 3  Anesthesia Plan: General   Post-op Pain Management: Tylenol PO (pre-op)*    Induction: Intravenous  PONV Risk Score and Plan: 2 and Treatment may vary due to age or medical condition, Ondansetron and Dexamethasone  Airway Management Planned: Oral ETT  Additional Equipment: Arterial line  Intra-op Plan:   Post-operative Plan: Extubation in OR  Informed Consent: I have reviewed the patients History and Physical, chart, labs and discussed the procedure including the risks, benefits and alternatives for the proposed anesthesia with the patient or authorized representative who has indicated his/her understanding and acceptance.     Dental advisory given  Plan Discussed with: CRNA and Anesthesiologist  Anesthesia Plan Comments: (. )      Anesthesia Quick Evaluation

## 2022-06-06 NOTE — Progress Notes (Signed)
Anesthesia Chart Review:  Case: [id number] Date/Time: 06/10/22 1015   Procedure: Right Transcarotid Artery Revascularization (Right)   Anesthesia type: General   Pre-op diagnosis: Right carotid artery stenosis   Location: [facility] ROOM 16 / [facility]   Surgeons: Waynetta Sandy, MD       DISCUSSION:  Patient is a 81 year old male scheduled for the above procedure. He was admitted 02/11/22-02/14/22 for bilateral infarcts with suspicious for embolic etiology. Found to have bilateral severe ICA stenosis. Staged TCAR planned. S/p left TCAR on 03/25/22. He did have significant bruising and post-operative urinary retention requiring I&O cath and Flomax, but was still able to be discharged home as planned on 03/26/22. Patent left ICA stent at 05/04/22 [facility] follow-up. Right TCAR now scheduled to address 80-99% RICA stenosis. Of note he also has a 5.3 cm AAA (by 01/21/22 CTA) that is being followed at [facility] and management options will be further addressed following recovery from TCAR.   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   Preoperative labs show Creatinine 1.7, previously ~ 1.5-1.80 range since May 2022, although primarily in the 1.5-1.60 range since Mary 2023. K 4.0. His last PLT count was 128K, which is consistent with labs since at least May 2022, PLT count 106-132K. 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 PT/INR 1.50/1.2. aPTT 38. A1c 6.4%.   He is to continue DAPT with ASA and Brilinta per [facility]. Anesthesia team to evaluate on the  day of surgery.    VS: BP (!) 161/66   Pulse (!) 58   Temp 36.4 C   Resp 17   Ht 6' (1.829 m)   Wt 78.9 kg   SpO2 99%   BMI 23.59 kg/m    PROVIDERS: Kathyrn Drown, MD is PCP  Carlyle Dolly, MD is primary cardiologist. Last office visit 09/29/21. Seen for TEE by Gwyndolyn Kaufman, MD on 02/14/22.  [name], MD is vascular surgeon   LABS: Labs noted. See DISCUSSION.  (all labs ordered are listed, but only abnormal results are displayed)  Labs Reviewed  GLUCOSE, CAPILLARY - Abnormal; Notable for the following components:      Result Value   Glucose-Capillary 157 (*)    All other components within normal limits  CBC - Abnormal; Notable for the following components:   RBC 4.13 (*)    Platelets 128 (*)    All other components within normal limits  COMPREHENSIVE METABOLIC PANEL - Abnormal; Notable for the following components:   Glucose, Bld 189 (*)    Creatinine, Ser 1.71 (*)    GFR, Estimated 40 (*)    All other components within normal limits  APTT - Abnormal; Notable for the following components:   aPTT 38 (*)    All other components within normal limits  URINALYSIS, ROUTINE W REFLEX MICROSCOPIC - Abnormal; Notable for the following components:   Hgb urine dipstick MODERATE (*)    Protein, ur 30 (*)    Bacteria, UA RARE (*)    All other components within normal limits  HEMOGLOBIN A1C - Abnormal; Notable for  the following components:   Hgb A1c MFr Bld 6.4 (*)    All other components within normal limits  SURGICAL PCR SCREEN  PROTIME-INR  TYPE AND SCREEN    IMAGES: CTA Neck 02/12/22: IMPRESSION: 1. Progressive atherosclerosis in the head and neck. No large vessel occlusion. 2. 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 Prominent soft plaque in both carotid bulbs with plaque ulceration. 3. Moderate bilateral intracranial ICA stenoses. 4. Severe left V4 stenosis. 5. Aortic Atherosclerosis (ICD10-I70.0) and Emphysema (ICD10-J43.9). - Now s/p left TCAR on on  03/25/22.   MRI Brain 02/11/22: IMPRESSION: 1. Multiple punctate supratentorial acute infarcts bilaterally, detailed above. Given involvement of multiple vascular territories, consider an embolic etiology. No significant mass effect. 2. Abnormal signal within the left intradural vertebral artery on T2 imaging with apparent filling defect versus atherosclerotic plaque on postcontrast imaging. Recommend CTA or MRA to further evaluate. 3. No retrocochlear mass. 4. Cerebral atrophy, chronic microvascular ischemic disease and remote inferior left cerebellar infarct.   CTA Abd/pelvis 01/21/22: IMPRESSION: VASCULAR 1. Interval enlargement of previously visualized juxtarenal fusiform abdominal aortic aneurysm measuring up to 5.3 cm, previously 4.8 cm. Recommend referral to a vascular specialist if not already obtained. This recommendation follows ACR consensus guidelines: White Paper of the ACR Incidental Findings Committee II on Vascular Findings. J Am Coll Radiol 2013; 10:789-794. 2. 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 3. 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 NON-VASCULAR 1. No acute abdominopelvic abnormality. 2. Diverticulosis.     EKG: 02/11/22:  Sinus rhythm Incomplete left bundle branch block [old] Confirmed by Nanda Quinton [phone]) on 02/12/2022 1:36:52 PM     CV: US Carotid 05/04/22: Summary:  Right Carotid: Velocities in the right ICA are consistent with a 80-99%                 stenosis.  Left Carotid: Patent stent with no evidence of restenosis.  Vertebrals:  Bilateral vertebral arteries demonstrate antegrade flow.  Subclavians: Normal flow hemodynamics were seen in bilateral subclavian               arteries.      Cardiac event monitor 02/20/22-03/06/22: Study Highlights Patient had a min HR of 49 bpm, max HR of 120 bpm, and avg HR of 63  bpm. Predominant underlying rhythm was Sinus Rhythm. Atrial Fibrillation occurred (<1% burden), ranging from 66-114 bpm (avg of 85 bpm), the longest lasting 51 mins 0 secs with an avg  rate of 85 bpm. Atrial Fibrillation was present at activation of device. Atrial Fibrillation was detected within +/- 45 seconds of symptomatic patient event(s). Isolated SVEs were rare (<1.0%), SVE Couplets were rare (<1.0%), and SVE Triplets were rare  (<1.0%). Isolated VEs were rare (<1.0%), and no VE Couplets or VE Triplets were present. Ventricular Bigeminy and Trigeminy were present.     TEE 02/14/22: IMPRESSIONS   1. Left ventricular ejection fraction, by estimation, is 50 to 55%. The  left ventricle has low normal function.   2. Right ventricular systolic function is normal. The right ventricular  size is normal.   3. Left atrial size was moderately dilated. No left atrial/left atrial  appendage thrombus was detected. The LAA emptying velocity was 60 cm/s.   4. The mitral valve is normal in structure. Mild mitral valve  regurgitation.   5. The aortic valve is tricuspid. There is mild calcification of the  aortic valve. There is  mild thickening of the aortic valve. Aortic valve  regurgitation is mild. Aortic valve sclerosis/calcification is present,  without any evidence of aortic  stenosis.   6. Aortic dilatation noted. There is mild dilatation of the aortic root,  measuring 42 mm. There is Moderate (Grade III) plaque.   7. Evidence of atrial level shunting detected by color flow Doppler.  Agitated saline contrast bubble study was positive with shunting observed  within 3-6 cardiac cycles suggestive of interatrial shunt.      LE Venous US 02/13/22: IMPRESSION: No evidence of deep venous thrombosis in either lower extremity.     Cardiac cath/PCI 02/24/20: 1. 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 2. S/P CABG with continued patency of the LIMA-LAD, sequential  SVG-OM1 and OM2, and severe stenosis of the SVG-diagonal 3. Chronic occlusion of the SVG-PDA/PLA branches with left-to-right collaterals present 4. Successful SVG-PCI with stenting of the proximal and distal body of the SVG-diagonal, with transient no-reflow and severe small vessel distal disease.    Recommend aggressive medical therapy, minimum 12 months of ASA/ticagrelor consider long-term if tolerated   Past Medical History:  Diagnosis Date   AAA (abdominal aortic aneurysm) ([place])    Arthritis    CAD (coronary artery disease)    a. s/p CABG in 1997 b. stent to SVG-PDA and PTCA of LAD in 2003 c. cath in 2015 showing patent SVG-OM1-OM2, SVG-RI, SVG-D1, and LIMA-LAD with occluded SVG-PDA and severe stenosis of mid-LAD at LIMA insertion with DES placed d. 04/2018: similar results to 2015 with patent LAD stent; occlusion of small caliber D1 stenosis with medical management recommended.    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   Myocardial infarction [facility]) 1997   stents x2 (2003/2007)    Past Surgical History:  Procedure Laterality Date   CORONARY ANGIOPLASTY  2003/2007   2 stents   CORONARY ANGIOPLASTY WITH STENT PLACEMENT  08/14/2014   DES to LIMA-LAD insertion   CORONARY ARTERY BYPASS GRAFT  12/06/1995   7 vessels   CORONARY STENT INTERVENTION N/A 02/24/2020   Procedure: CORONARY STENT INTERVENTION;  Surgeon: Sherren Mocha, MD;  Location: [place] CV LAB;  Service: Cardiovascular;  Laterality: N/A;   CORONARY  STENT PLACEMENT  08/15/2014   MID LAD  DES  by Dr Leslye Peer ANGIOGRAPHY N/A 02/24/2020   Procedure: Remus Blake ANGIOGRAPHY;  Surgeon: Sherren Mocha, MD;  Location: [place] CV LAB;  Service: Cardiovascular;  Laterality: N/A;   CYSTECTOMY  12/06/2003   top of head-Jenkins   EAR CYST EXCISION  07/09/2012   Procedure: CYST REMOVAL;  Surgeon: Jamesetta So, MD;  Location: [facility];  Service: General;  Laterality: N/A;   EYE SURGERY Right    cataract removal and then another surgery after   HERNIA REPAIR     1980's inguinal Left side   LEFT HEART CATH AND CORS/GRAFTS ANGIOGRAPHY N/A 04/16/2018   Procedure: LEFT HEART CATH AND CORS/GRAFTS ANGIOGRAPHY;  Surgeon: Martinique,  Ander Slade, MD;  Location: [place] CV LAB;  Service: Cardiovascular;  Laterality: N/A;   LEFT HEART CATHETERIZATION WITH CORONARY/GRAFT ANGIOGRAM N/A 08/14/2014   Procedure: LEFT HEART CATHETERIZATION WITH Beatrix Fetters;  Surgeon: Blane Ohara, MD;  Location: [facility] CATH LAB;  Service: Cardiovascular;  Laterality: N/A;   TEE WITHOUT CARDIOVERSION N/A 02/14/2022   Procedure: TRANSESOPHAGEAL ECHOCARDIOGRAM (TEE);  Surgeon: Freada Bergeron, MD;  Location: [facility];  Service: Cardiovascular;  Laterality: N/A;   TRANSCAROTID ARTERY REVASCULARIZATION  Left 03/25/2022   Procedure: LEFT TRANCAROTID ARTERY REVASCULARIZATION USING 55m X 424mENROUTE STENT SYSTEM;  Surgeon: CaWaynetta SandyMD;  [facility];  Laterality: Left;   ULTRASOUND GUIDANCE FOR VASCULAR ACCESS N/A 03/25/2022   Procedure: ULTRASOUND GUIDANCE FOR VASCULAR ACCESS;  Surgeon: CaWaynetta SandyMD;  [place]: Vascular;  Laterality: N/A;    MEDICATIONS:  acetaminophen (TYLENOL) 500 MG tablet   Aloe Vera Juice LIQD   aspirin EC 81 MG tablet   Cholecalciferol (VITAMIN D3) 250 MCG (10000 UT) TABS   diphenhydrAMINE (BENADRYL) 50 MG capsule   glucose blood (ACCU-CHEK GUIDE) test  strip   HYDROcodone-acetaminophen (NORCO) 10-325 MG tablet   losartan (COZAAR) 25 MG tablet   metoprolol succinate (TOPROL-XL) 25 MG 24 hr tablet   oxyCODONE-acetaminophen (PERCOCET/ROXICET) 5-325 MG tablet   predniSONE (DELTASONE) 50 MG tablet   rosuvastatin (CRESTOR) 40 MG tablet   ticagrelor (BRILINTA) 60 MG TABS tablet   vitamin E 180 MG (400 UNITS) capsule   No current facility-administered medications for this encounter.  He is scheduled to take preprocedure prednisone and Benadryl.   AlMyra GianottiPA-C Surgical Short Stay/Anesthesiology [facility] ([phone] Regional Health Centerhone ([phone]/02/2022 2:03 PM

## 2022-06-10 ENCOUNTER — Encounter (HOSPITAL_COMMUNITY)
Admission: RE | Disposition: A | Discharge status: Home / Self Care | Payer: Self-pay | Source: Home / Self Care | Attending: Vascular Surgery

## 2022-06-10 ENCOUNTER — Inpatient Hospital Stay (HOSPITAL_COMMUNITY)
Admission: RE | Admit: 2022-06-10 | Discharge: 2022-06-12 | DRG: 035 | Disposition: A | Discharge status: Home / Self Care | Payer: Medicare Other | Attending: Vascular Surgery | Admitting: Vascular Surgery

## 2022-06-10 ENCOUNTER — Other Ambulatory Visit: Payer: Self-pay

## 2022-06-10 ENCOUNTER — Inpatient Hospital Stay (HOSPITAL_COMMUNITY): Payer: Medicare Other | Admitting: Anesthesiology

## 2022-06-10 ENCOUNTER — Encounter (HOSPITAL_COMMUNITY): Payer: Self-pay | Admitting: Vascular Surgery

## 2022-06-10 ENCOUNTER — Inpatient Hospital Stay (HOSPITAL_COMMUNITY): Payer: Medicare Other | Admitting: Vascular Surgery

## 2022-06-10 ENCOUNTER — Inpatient Hospital Stay (HOSPITAL_COMMUNITY): Payer: Medicare Other

## 2022-06-10 DIAGNOSIS — M545 Low back pain, unspecified: Secondary | ICD-10-CM | POA: Diagnosis not present

## 2022-06-10 DIAGNOSIS — Z951 Presence of aortocoronary bypass graft: Secondary | ICD-10-CM | POA: Diagnosis not present

## 2022-06-10 DIAGNOSIS — I6521 Occlusion and stenosis of right carotid artery: Principal | ICD-10-CM | POA: Diagnosis present

## 2022-06-10 DIAGNOSIS — I63231 Cerebral infarction due to unspecified occlusion or stenosis of right carotid arteries: Secondary | ICD-10-CM | POA: Diagnosis not present

## 2022-06-10 DIAGNOSIS — E785 Hyperlipidemia, unspecified: Secondary | ICD-10-CM | POA: Diagnosis not present

## 2022-06-10 DIAGNOSIS — Z91041 Radiographic dye allergy status: Secondary | ICD-10-CM

## 2022-06-10 DIAGNOSIS — I252 Old myocardial infarction: Secondary | ICD-10-CM

## 2022-06-10 DIAGNOSIS — I6529 Occlusion and stenosis of unspecified carotid artery: Secondary | ICD-10-CM | POA: Diagnosis present

## 2022-06-10 DIAGNOSIS — Z7982 Long term (current) use of aspirin: Secondary | ICD-10-CM | POA: Diagnosis not present

## 2022-06-10 DIAGNOSIS — Z9102 Food additives allergy status: Secondary | ICD-10-CM

## 2022-06-10 DIAGNOSIS — G8929 Other chronic pain: Secondary | ICD-10-CM | POA: Diagnosis not present

## 2022-06-10 DIAGNOSIS — J449 Chronic obstructive pulmonary disease, unspecified: Secondary | ICD-10-CM | POA: Diagnosis not present

## 2022-06-10 DIAGNOSIS — I2510 Atherosclerotic heart disease of native coronary artery without angina pectoris: Secondary | ICD-10-CM

## 2022-06-10 DIAGNOSIS — Z7902 Long term (current) use of antithrombotics/antiplatelets: Secondary | ICD-10-CM

## 2022-06-10 DIAGNOSIS — Z79899 Other long term (current) drug therapy: Secondary | ICD-10-CM | POA: Diagnosis not present

## 2022-06-10 DIAGNOSIS — I129 Hypertensive chronic kidney disease with stage 1 through stage 4 chronic kidney disease, or unspecified chronic kidney disease: Secondary | ICD-10-CM | POA: Diagnosis not present

## 2022-06-10 DIAGNOSIS — I130 Hypertensive heart and chronic kidney disease with heart failure and stage 1 through stage 4 chronic kidney disease, or unspecified chronic kidney disease: Secondary | ICD-10-CM | POA: Diagnosis present

## 2022-06-10 DIAGNOSIS — I714 Abdominal aortic aneurysm, without rupture, unspecified: Secondary | ICD-10-CM | POA: Diagnosis not present

## 2022-06-10 DIAGNOSIS — E1122 Type 2 diabetes mellitus with diabetic chronic kidney disease: Secondary | ICD-10-CM | POA: Diagnosis not present

## 2022-06-10 DIAGNOSIS — Z955 Presence of coronary angioplasty implant and graft: Secondary | ICD-10-CM

## 2022-06-10 DIAGNOSIS — Z8249 Family history of ischemic heart disease and other diseases of the circulatory system: Secondary | ICD-10-CM

## 2022-06-10 DIAGNOSIS — Z87891 Personal history of nicotine dependence: Secondary | ICD-10-CM

## 2022-06-10 DIAGNOSIS — I5022 Chronic systolic (congestive) heart failure: Secondary | ICD-10-CM | POA: Diagnosis present

## 2022-06-10 DIAGNOSIS — Z888 Allergy status to other drugs, medicaments and biological substances status: Secondary | ICD-10-CM

## 2022-06-10 DIAGNOSIS — Z881 Allergy status to other antibiotic agents status: Secondary | ICD-10-CM | POA: Diagnosis not present

## 2022-06-10 DIAGNOSIS — E119 Type 2 diabetes mellitus without complications: Secondary | ICD-10-CM

## 2022-06-10 DIAGNOSIS — N189 Chronic kidney disease, unspecified: Secondary | ICD-10-CM | POA: Diagnosis present

## 2022-06-10 DIAGNOSIS — Z8673 Personal history of transient ischemic attack (TIA), and cerebral infarction without residual deficits: Secondary | ICD-10-CM | POA: Diagnosis not present

## 2022-06-10 HISTORY — PX: ULTRASOUND GUIDANCE FOR VASCULAR ACCESS: SHX6516

## 2022-06-10 HISTORY — PX: TRANSCAROTID ARTERY REVASCULARIZATIONÂ: SHX6778

## 2022-06-10 LAB — CBC
HCT: 37.9 % — ABNORMAL LOW (ref 39.0–52.0)
Hemoglobin: 13.1 g/dL (ref 13.0–17.0)
MCH: 32.3 pg (ref 26.0–34.0)
MCHC: 34.6 g/dL (ref 30.0–36.0)
MCV: 93.6 fL (ref 80.0–100.0)
Platelets: 120 10*3/uL — ABNORMAL LOW (ref 150–400)
RBC: 4.05 MIL/uL — ABNORMAL LOW (ref 4.22–5.81)
RDW: 12.6 % (ref 11.5–15.5)
WBC: 13.7 10*3/uL — ABNORMAL HIGH (ref 4.0–10.5)
nRBC: 0 % (ref 0.0–0.2)

## 2022-06-10 LAB — GLUCOSE, CAPILLARY
Glucose-Capillary: 212 mg/dL — ABNORMAL HIGH (ref 70–99)
Glucose-Capillary: 234 mg/dL — ABNORMAL HIGH (ref 70–99)
Glucose-Capillary: 187 mg/dL — ABNORMAL HIGH (ref 70–99)

## 2022-06-10 LAB — POCT ACTIVATED CLOTTING TIME: Activated Clotting Time: 329 seconds

## 2022-06-10 LAB — CREATININE, SERUM
Creatinine, Ser: 1.61 mg/dL — ABNORMAL HIGH (ref 0.61–1.24)
GFR, Estimated: 43 mL/min — ABNORMAL LOW (ref >60)

## 2022-06-10 SURGERY — TRANSCAROTID ARTERY REVASCULARIZATION (TCAR)
Anesthesia: General | Site: Neck | Laterality: Right

## 2022-06-10 MED ORDER — ROSUVASTATIN CALCIUM 20 MG PO TABS
40.0000 mg | ORAL_TABLET | Freq: Every day | ORAL | Status: DC
  Administered 2022-06-10 – 2022-06-12 (×3): 40 mg via ORAL
  Filled 2022-06-10 (×3): qty 2

## 2022-06-10 MED ORDER — ORAL CARE MOUTH RINSE: 15.0000 mL | Freq: Once | OROMUCOSAL | Status: DC

## 2022-06-10 MED ORDER — HEPARIN SODIUM (PORCINE) 1000 UNIT/ML IJ SOLN
INTRAMUSCULAR | Status: DC | PRN
  Administered 2022-06-10: 10000 [IU] via INTRAVENOUS

## 2022-06-10 MED ORDER — PHENYLEPHRINE HCL-NACL 20-0.9 MG/250ML-% IV SOLN
INTRAVENOUS | Status: DC | PRN
  Administered 2022-06-10: 10 ug/min via INTRAVENOUS

## 2022-06-10 MED ORDER — LOSARTAN POTASSIUM 25 MG PO TABS
25.0000 mg | ORAL_TABLET | Freq: Every day | ORAL | Status: DC
  Administered 2022-06-11 – 2022-06-12 (×2): 25 mg via ORAL
  Filled 2022-06-10 (×2): qty 1

## 2022-06-10 MED ORDER — ASPIRIN 81 MG PO TBEC
81.0000 mg | DELAYED_RELEASE_TABLET | Freq: Every day | ORAL | Status: DC
  Administered 2022-06-11 – 2022-06-12 (×2): 81 mg via ORAL
  Filled 2022-06-10 (×2): qty 1

## 2022-06-10 MED ORDER — ONDANSETRON HCL 4 MG/2ML IJ SOLN
INTRAMUSCULAR | Status: DC | PRN
  Administered 2022-06-10: 4 mg via INTRAVENOUS

## 2022-06-10 MED ORDER — HEPARIN 6000 UNIT IRRIGATION SOLUTION
Status: AC
  Filled 2022-06-10: qty 500

## 2022-06-10 MED ORDER — PANTOPRAZOLE SODIUM 40 MG PO TBEC
40.0000 mg | DELAYED_RELEASE_TABLET | Freq: Every day | ORAL | Status: DC
Start: 2022-06-10 — End: 2022-06-12
  Administered 2022-06-10 – 2022-06-12 (×3): 40 mg via ORAL
  Filled 2022-06-10 (×3): qty 1

## 2022-06-10 MED ORDER — SUGAMMADEX SODIUM 200 MG/2ML IV SOLN
INTRAVENOUS | Status: DC | PRN
  Administered 2022-06-10: 400 mg via INTRAVENOUS

## 2022-06-10 MED ORDER — ONDANSETRON HCL 4 MG/2ML IJ SOLN: 4.0000 mg | Freq: Four times a day (QID) | INTRAMUSCULAR | Status: DC | PRN

## 2022-06-10 MED ORDER — TICAGRELOR 60 MG PO TABS
60.0000 mg | ORAL_TABLET | Freq: Two times a day (BID) | ORAL | Status: DC
  Administered 2022-06-10 – 2022-06-12 (×4): 60 mg via ORAL
  Filled 2022-06-10 (×5): qty 1

## 2022-06-10 MED ORDER — HEPARIN SODIUM (PORCINE) 5000 UNIT/ML IJ SOLN
5000.0000 [IU] | Freq: Three times a day (TID) | INTRAMUSCULAR | Status: DC
  Administered 2022-06-10 – 2022-06-12 (×6): 5000 [IU] via SUBCUTANEOUS
  Filled 2022-06-10 (×6): qty 1

## 2022-06-10 MED ORDER — LACTATED RINGERS IV SOLN: INTRAVENOUS | Status: DC

## 2022-06-10 MED ORDER — ROCURONIUM BROMIDE 10 MG/ML (PF) SYRINGE
PREFILLED_SYRINGE | INTRAVENOUS | Status: DC | PRN
  Administered 2022-06-10: 60 mg via INTRAVENOUS

## 2022-06-10 MED ORDER — PROTAMINE SULFATE 10 MG/ML IV SOLN
INTRAVENOUS | Status: DC | PRN
  Administered 2022-06-10: 30 mg via INTRAVENOUS
  Administered 2022-06-10: 20 mg via INTRAVENOUS

## 2022-06-10 MED ORDER — METOPROLOL SUCCINATE ER 25 MG PO TB24
25.0000 mg | ORAL_TABLET | Freq: Every day | ORAL | Status: DC
  Administered 2022-06-11: 25 mg via ORAL
  Filled 2022-06-10 (×2): qty 1

## 2022-06-10 MED ORDER — SODIUM CHLORIDE 0.9 % IV SOLN: INTRAVENOUS | Status: DC

## 2022-06-10 MED ORDER — CHLORHEXIDINE GLUCONATE CLOTH 2 % EX PADS: 6.0000 | MEDICATED_PAD | Freq: Once | CUTANEOUS | Status: DC

## 2022-06-10 MED ORDER — NALOXONE HCL 0.4 MG/ML IJ SOLN
INTRAMUSCULAR | Status: DC | PRN
  Administered 2022-06-10: .2 mg via INTRAVENOUS

## 2022-06-10 MED ORDER — HYDROMORPHONE HCL 1 MG/ML IJ SOLN
0.5000 mg | INTRAMUSCULAR | Status: DC | PRN
  Administered 2022-06-10: 1 mg via INTRAVENOUS
  Filled 2022-06-10: qty 1

## 2022-06-10 MED ORDER — POTASSIUM CHLORIDE CRYS ER 20 MEQ PO TBCR: 20.0000 meq | EXTENDED_RELEASE_TABLET | Freq: Every day | ORAL | Status: DC | PRN

## 2022-06-10 MED ORDER — PROPOFOL 10 MG/ML IV BOLUS
INTRAVENOUS | Status: AC
  Filled 2022-06-10: qty 20

## 2022-06-10 MED ORDER — PHENYLEPHRINE 80 MCG/ML (10ML) SYRINGE FOR IV PUSH (FOR BLOOD PRESSURE SUPPORT)
PREFILLED_SYRINGE | INTRAVENOUS | Status: DC | PRN
  Administered 2022-06-10: 80 ug via INTRAVENOUS

## 2022-06-10 MED ORDER — FENTANYL CITRATE (PF) 250 MCG/5ML IJ SOLN
INTRAMUSCULAR | Status: AC
  Filled 2022-06-10: qty 5

## 2022-06-10 MED ORDER — SODIUM CHLORIDE 0.9 % IV SOLN: 500.0000 mL | Freq: Once | INTRAVENOUS | Status: DC | PRN

## 2022-06-10 MED ORDER — PROPOFOL 10 MG/ML IV BOLUS
INTRAVENOUS | Status: DC | PRN
  Administered 2022-06-10: 110 mg via INTRAVENOUS

## 2022-06-10 MED ORDER — ACETAMINOPHEN 650 MG RE SUPP: 325.0000 mg | RECTAL | Status: DC | PRN

## 2022-06-10 MED ORDER — VANCOMYCIN HCL IN DEXTROSE 1-5 GM/200ML-% IV SOLN
1000.0000 mg | INTRAVENOUS | Status: AC
  Administered 2022-06-10 – 2022-06-11 (×2): 1000 mg via INTRAVENOUS
  Filled 2022-06-10 (×2): qty 200

## 2022-06-10 MED ORDER — CHLORHEXIDINE GLUCONATE 0.12 % MT SOLN
15.0000 mL | Freq: Once | OROMUCOSAL | Status: AC
Start: 2022-06-10 — End: 2022-06-10

## 2022-06-10 MED ORDER — GLYCOPYRROLATE PF 0.2 MG/ML IJ SOSY
PREFILLED_SYRINGE | INTRAMUSCULAR | Status: DC | PRN
  Administered 2022-06-10: .1 mg via INTRAVENOUS
  Administered 2022-06-10 (×2): .2 mg via INTRAVENOUS
  Administered 2022-06-10: .1 mg via INTRAVENOUS

## 2022-06-10 MED ORDER — EPHEDRINE SULFATE-NACL 50-0.9 MG/10ML-% IV SOSY
PREFILLED_SYRINGE | INTRAVENOUS | Status: DC | PRN
  Administered 2022-06-10 (×3): 5 mg via INTRAVENOUS

## 2022-06-10 MED ORDER — OXYCODONE HCL 5 MG PO TABS: 5.0000 mg | ORAL_TABLET | ORAL | Status: DC | PRN

## 2022-06-10 MED ORDER — ASPIRIN 81 MG PO TBEC: 81.0000 mg | DELAYED_RELEASE_TABLET | Freq: Every day | ORAL | Status: DC

## 2022-06-10 MED ORDER — INSULIN ASPART 100 UNIT/ML IJ SOLN
0.0000 [IU] | INTRAMUSCULAR | Status: AC | PRN
  Administered 2022-06-10: 3 [IU] via SUBCUTANEOUS
  Administered 2022-06-10: 2 [IU] via SUBCUTANEOUS
  Filled 2022-06-10: qty 1

## 2022-06-10 MED ORDER — LABETALOL HCL 5 MG/ML IV SOLN
10.0000 mg | INTRAVENOUS | Status: DC | PRN
  Administered 2022-06-10: 10 mg via INTRAVENOUS
  Filled 2022-06-10: qty 4

## 2022-06-10 MED ORDER — IODIXANOL 320 MG/ML IV SOLN
INTRAVENOUS | Status: DC | PRN
  Administered 2022-06-10: 18 mL via INTRA_ARTERIAL

## 2022-06-10 MED ORDER — BISACODYL 5 MG PO TBEC: 5.0000 mg | DELAYED_RELEASE_TABLET | Freq: Every day | ORAL | Status: DC | PRN

## 2022-06-10 MED ORDER — FENTANYL CITRATE (PF) 250 MCG/5ML IJ SOLN
INTRAMUSCULAR | Status: DC | PRN
  Administered 2022-06-10 (×2): 50 ug via INTRAVENOUS

## 2022-06-10 MED ORDER — HEPARIN 6000 UNIT IRRIGATION SOLUTION
Status: DC | PRN
  Administered 2022-06-10: 1

## 2022-06-10 MED ORDER — DEXAMETHASONE SODIUM PHOSPHATE 10 MG/ML IJ SOLN
INTRAMUSCULAR | Status: DC | PRN
  Administered 2022-06-10: 10 mg via INTRAVENOUS

## 2022-06-10 MED ORDER — ALUM & MAG HYDROXIDE-SIMETH 200-200-20 MG/5ML PO SUSP
15.0000 mL | ORAL | Status: DC | PRN
  Administered 2022-06-11: 30 mL via ORAL
  Filled 2022-06-10: qty 30

## 2022-06-10 MED ORDER — HEMOSTATIC AGENTS (NO CHARGE) OPTIME
TOPICAL | Status: DC | PRN
  Administered 2022-06-10: 1 via TOPICAL

## 2022-06-10 MED ORDER — DIPHENHYDRAMINE HCL 50 MG/ML IJ SOLN
25.0000 mg | Freq: Once | INTRAMUSCULAR | Status: AC
  Administered 2022-06-10: 25 mg via INTRAVENOUS
  Filled 2022-06-10: qty 1

## 2022-06-10 MED ORDER — ACETAMINOPHEN 325 MG PO TABS: 325.0000 mg | ORAL_TABLET | ORAL | Status: DC | PRN

## 2022-06-10 MED ORDER — HYDRALAZINE HCL 20 MG/ML IJ SOLN: 5.0000 mg | INTRAMUSCULAR | Status: DC | PRN

## 2022-06-10 MED ORDER — LIDOCAINE 2% (20 MG/ML) 5 ML SYRINGE
INTRAMUSCULAR | Status: DC | PRN
  Administered 2022-06-10: 60 mg via INTRAVENOUS

## 2022-06-10 MED ORDER — OXYCODONE HCL 5 MG PO TABS: 5.0000 mg | ORAL_TABLET | Freq: Once | ORAL | Status: DC | PRN

## 2022-06-10 MED ORDER — ACETAMINOPHEN 500 MG PO TABS: 1000.0000 mg | ORAL_TABLET | Freq: Four times a day (QID) | ORAL | Status: DC | PRN

## 2022-06-10 MED ORDER — VANCOMYCIN HCL IN DEXTROSE 1-5 GM/200ML-% IV SOLN
1000.0000 mg | INTRAVENOUS | Status: AC
  Administered 2022-06-10: 1000 mg via INTRAVENOUS
  Filled 2022-06-10: qty 200

## 2022-06-10 MED ORDER — ONDANSETRON HCL 4 MG/2ML IJ SOLN: 4.0000 mg | Freq: Once | INTRAMUSCULAR | Status: DC | PRN

## 2022-06-10 MED ORDER — OXYCODONE HCL 5 MG/5ML PO SOLN: 5.0000 mg | Freq: Once | ORAL | Status: DC | PRN

## 2022-06-10 MED ORDER — LIDOCAINE HCL (PF) 1 % IJ SOLN
INTRAMUSCULAR | Status: AC
  Filled 2022-06-10: qty 5

## 2022-06-10 MED ORDER — ACETAMINOPHEN 500 MG PO TABS
1000.0000 mg | ORAL_TABLET | Freq: Once | ORAL | Status: AC
Start: 2022-06-10 — End: 2022-06-10
  Administered 2022-06-10: 1000 mg via ORAL
  Filled 2022-06-10: qty 2

## 2022-06-10 MED ORDER — FENTANYL CITRATE (PF) 100 MCG/2ML IJ SOLN: 25.0000 ug | INTRAMUSCULAR | Status: DC | PRN

## 2022-06-10 MED ORDER — MAGNESIUM SULFATE 2 GM/50ML IV SOLN: 2.0000 g | Freq: Every day | INTRAVENOUS | Status: DC | PRN

## 2022-06-10 MED ORDER — CHLORHEXIDINE GLUCONATE 0.12 % MT SOLN
OROMUCOSAL | Status: AC
  Administered 2022-06-10: 15 mL via OROMUCOSAL
  Filled 2022-06-10: qty 15

## 2022-06-10 MED ORDER — METOPROLOL TARTRATE 5 MG/5ML IV SOLN: 2.0000 mg | INTRAVENOUS | Status: DC | PRN

## 2022-06-10 SURGICAL SUPPLY — 63 items
ADH SKN CLS APL DERMABOND .7 (GAUZE/BANDAGES/DRESSINGS) ×2
BAG BANDED W/RUBBER/TAPE 36X54 (MISCELLANEOUS) ×3 IMPLANT
BAG COUNTER SPONGE SURGICOUNT (BAG) ×3 IMPLANT
BAG EQP BAND 135X91 W/RBR TAPE (MISCELLANEOUS) ×2
BAG SPNG CNTER NS LX DISP (BAG) ×2
BALLN STERLING RX 6X30X80 (BALLOONS) ×3
BALLOON STERLING RX 6X30X80 (BALLOONS) IMPLANT
CANISTER SUCT 3000ML PPV (MISCELLANEOUS) ×3 IMPLANT
CATH ROBINSON RED A/P 18FR (CATHETERS) IMPLANT
CLIP LIGATING EXTRA MED SLVR (CLIP) ×3 IMPLANT
CLIP LIGATING EXTRA SM BLUE (MISCELLANEOUS) ×3 IMPLANT
COVER DOME SNAP 22 D (MISCELLANEOUS) ×3 IMPLANT
COVER PROBE W GEL 5X96 (DRAPES) ×3 IMPLANT
DERMABOND ADVANCED (GAUZE/BANDAGES/DRESSINGS) ×1
DERMABOND ADVANCED .7 DNX12 (GAUZE/BANDAGES/DRESSINGS) ×2 IMPLANT
DRAPE FEMORAL ANGIO 80X135IN (DRAPES) ×3 IMPLANT
ELECT REM PT RETURN 9FT ADLT (ELECTROSURGICAL) ×3
ELECTRODE REM PT RTRN 9FT ADLT (ELECTROSURGICAL) ×2 IMPLANT
GLOVE BIO SURGEON STRL SZ7.5 (GLOVE) ×3 IMPLANT
GOWN STRL REUS W/ TWL LRG LVL3 (GOWN DISPOSABLE) ×4 IMPLANT
GOWN STRL REUS W/ TWL XL LVL3 (GOWN DISPOSABLE) ×2 IMPLANT
GOWN STRL REUS W/TWL LRG LVL3 (GOWN DISPOSABLE) ×6
GOWN STRL REUS W/TWL XL LVL3 (GOWN DISPOSABLE) ×3
GUIDEWIRE ENROUTE 0.014 (WIRE) ×3 IMPLANT
HEMOSTAT SNOW SURGICEL 2X4 (HEMOSTASIS) IMPLANT
INSERT FOGARTY SM (MISCELLANEOUS) IMPLANT
INTRODUCER KIT GALT 7CM (INTRODUCER) ×3
KIT BASIN OR (CUSTOM PROCEDURE TRAY) ×3 IMPLANT
KIT ENCORE 26 ADVANTAGE (KITS) ×3 IMPLANT
KIT INTRODUCER GALT 7 (INTRODUCER) ×2 IMPLANT
KIT TURNOVER KIT B (KITS) ×3 IMPLANT
NDL HYPO 25GX1X1/2 BEV (NEEDLE) IMPLANT
NEEDLE HYPO 25GX1X1/2 BEV (NEEDLE) IMPLANT
PACK CAROTID (CUSTOM PROCEDURE TRAY) ×3 IMPLANT
POSITIONER HEAD DONUT 9IN (MISCELLANEOUS) ×3 IMPLANT
POWDER SURGICEL 3.0 GRAM (HEMOSTASIS) ×1 IMPLANT
PROTECTION STATION PRESSURIZED (MISCELLANEOUS)
SET MICROPUNCTURE 5F STIFF (MISCELLANEOUS) IMPLANT
SHEATH AVANTI 11CM 5FR (SHEATH) IMPLANT
SHUNT CAROTID BYPASS 10 (VASCULAR PRODUCTS) IMPLANT
SHUNT CAROTID BYPASS 12FRX15.5 (VASCULAR PRODUCTS) IMPLANT
STATION PROTECTION PRESSURIZED (MISCELLANEOUS) IMPLANT
STENT TRANSCAROTID SYS 10X40 (Permanent Stent) ×1 IMPLANT
SUT MNCRL AB 4-0 PS2 18 (SUTURE) ×3 IMPLANT
SUT PROLENE 5 0 C 1 24 (SUTURE) ×3 IMPLANT
SUT PROLENE 6 0 BV (SUTURE) IMPLANT
SUT PROLENE 7 0 BV 1 (SUTURE) IMPLANT
SUT SILK 2 0 PERMA HAND 18 BK (SUTURE) ×3 IMPLANT
SUT SILK 2 0 SH CR/8 (SUTURE) ×3 IMPLANT
SUT SILK 3 0 (SUTURE)
SUT SILK 3-0 18XBRD TIE 12 (SUTURE) IMPLANT
SUT VIC AB 3-0 SH 27 (SUTURE) ×3
SUT VIC AB 3-0 SH 27X BRD (SUTURE) ×2 IMPLANT
SYR 10ML LL (SYRINGE) ×9 IMPLANT
SYR 20ML LL LF (SYRINGE) ×3 IMPLANT
SYR CONTROL 10ML LL (SYRINGE) IMPLANT
SYSTEM TRANSCAROTID NEUROPRTCT (MISCELLANEOUS) ×2 IMPLANT
TOWEL GREEN STERILE (TOWEL DISPOSABLE) ×3 IMPLANT
TRANSCAROTID NEUROPROTECT SYS (MISCELLANEOUS) ×3
TUBING ART PRESS 48 MALE/FEM (TUBING) IMPLANT
TUBING EXTENTION W/L.L. (IV SETS) IMPLANT
WATER STERILE IRR 1000ML POUR (IV SOLUTION) ×3 IMPLANT
WIRE BENTSON .035X145CM (WIRE) ×3 IMPLANT

## 2022-06-10 NOTE — H&P (Signed)
HPI:   Peter [name] is a 81 y.o. 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  After the TCAR he did have significant bruising.  He has now mostly recovered from this although he still feels a bit worn down.  He is now here to discuss right-sided TCAR.       Past Medical History:  Diagnosis Date   AAA (abdominal aortic aneurysm) ([place])     CAD (coronary artery disease)      a. s/p CABG in 1997 b. stent to SVG-PDA and PTCA of LAD in 2003 c. cath in 2015 showing patent SVG-OM1-OM2, SVG-RI, SVG-D1, and LIMA-LAD with occluded SVG-PDA and severe stenosis of mid-LAD at LIMA insertion with DES placed d. 04/2018: similar results to 2015 with patent LAD stent; occlusion of small caliber D1 stenosis with medical management recommended.    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   Myocardial infarction [facility]) 1997    stents x2 (2003/2007)         Family History  Problem Relation Age of Onset   Other Father          deceased after ?TCS or barium enema, age 67s   Hypertension Father     Heart attack Mother     Colon cancer Neg Hx     Liver disease Neg Hx           Past Surgical History:  Procedure Laterality Date   CORONARY ANGIOPLASTY   2003/2007    2 stents   CORONARY ANGIOPLASTY WITH STENT PLACEMENT   08/14/2014    DES to LIMA-LAD insertion   CORONARY ARTERY BYPASS GRAFT   12/06/1995    7 vessels   CORONARY STENT  INTERVENTION N/A 02/24/2020    Procedure: CORONARY STENT INTERVENTION;  Surgeon: Sherren Mocha, MD;  Location: [place] CV LAB;  Service: Cardiovascular;  Laterality: N/A;   CORONARY STENT PLACEMENT   08/15/2014    MID LAD  DES  by Dr Leslye Peer ANGIOGRAPHY N/A 02/24/2020    Procedure: Remus Blake ANGIOGRAPHY;  Surgeon: Sherren Mocha, MD;  Location: [place] CV LAB;  Service: Cardiovascular;  Laterality: N/A;   CYSTECTOMY   12/06/2003    top of head-Jenkins   EAR CYST EXCISION   07/09/2012    Procedure: CYST REMOVAL;  Surgeon: Jamesetta So, MD;  Location: [facility];  Service: General;  Laterality: N/A;   EYE SURGERY Right      cataract removal and then another surgery after   HERNIA REPAIR        1980's inguinal Left side   LEFT HEART CATH AND CORS/GRAFTS ANGIOGRAPHY N/A 04/16/2018    Procedure: LEFT HEART CATH AND CORS/GRAFTS ANGIOGRAPHY;  Surgeon: Martinique, Peter M, MD;  Location: [place] CV LAB;  Service: Cardiovascular;  Laterality: N/A;   LEFT HEART CATHETERIZATION  WITH CORONARY/GRAFT ANGIOGRAM N/A 08/14/2014    Procedure: LEFT HEART CATHETERIZATION WITH Beatrix Fetters;  Surgeon: Blane Ohara, MD;  Location: [facility] CATH LAB;  Service: Cardiovascular;  Laterality: N/A;   TEE WITHOUT CARDIOVERSION N/A 02/14/2022    Procedure: TRANSESOPHAGEAL ECHOCARDIOGRAM (TEE);  Surgeon: Freada Bergeron, MD;  Location: [facility];  Service: Cardiovascular;  Laterality: N/A;   TRANSCAROTID ARTERY REVASCULARIZATION  Left 03/25/2022    Procedure: LEFT TRANCAROTID ARTERY REVASCULARIZATION USING 19m X 450mENROUTE [place] Surgeon: CaWaynetta SandyMD;  [facility];  Laterality: Left;   ULTRASOUND GUIDANCE FOR VASCULAR ACCESS N/A 03/25/2022    Procedure: ULTRASOUND GUIDANCE FOR VASCULAR ACCESS;  Surgeon: CaWaynetta SandyMD;  [place]: V[facility];  Laterality: N/A;      Short Social History:  Social  History         Tobacco Use   Smoking status: Former      Packs/day: 3.00      Years: 40.00      Pack years: 120.00      Types: Cigarettes      Quit date: 07/02/2000      Years since quitting: 21.8   Smokeless tobacco: Never  Substance Use Topics   Alcohol use: No      Alcohol/week: 10.0 standard drinks      Types: 10 Cans of beer per week      Comment: quit 3 months (01/2013). 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           Allergies  Allergen Reactions   Dye Fdc Red [Red Dye] Hives      hives   Ivp Dye [Iodinated Contrast Media] Hives   Plavix [Clopidogrel] Hives      Unsure if dye or Plavix   Keflex [Cephalexin] Other (See Comments)      hoarsness            Current Outpatient Medications  Medication Sig Dispense Refill   ACCU-CHEK GUIDE test strip USE 1 STRIP TO CHECK GLUCOSE TWICE DAILY DUE TO FLUCTUATING SUGARS 100 each 0   acetaminophen (TYLENOL) 500 MG tablet Take 1,000 mg by mouth every 6 (six) hours as needed for mild pain.       Aloe Vera Juice LIQD Take 8 oz by mouth daily.       aspirin EC 81 MG tablet Take 1 tablet (81 mg total) by mouth daily with breakfast. 90 tablet 3   losartan (COZAAR) 25 MG tablet Take 1 tablet (25 mg total) by mouth daily. 90 tablet 3   metoprolol succinate (TOPROL-XL) 25 MG 24 hr tablet Take 1 tablet (25 mg total) by mouth daily. 90 tablet 3   oxyCODONE-acetaminophen (PERCOCET/ROXICET) 5-325 MG tablet Take 1 tablet by mouth every 6 (six) hours as needed for moderate pain. 16 tablet 0   rosuvastatin (CRESTOR) 40 MG tablet Take 1 tablet (40 mg total) by mouth daily. 90 tablet 3   ticagrelor (BRILINTA) 60 MG TABS tablet Take 1 tablet (60 mg total) by mouth 2 (two) times daily. 60 tablet 11   vitamin E 180 MG (400 UNITS) capsule Take 400 Units by mouth daily.        No current facility-administered medications for this visit.      Review of Systems  Constitutional: Positive for fatigue.  Hematologic: Positive  for bruises/bleeds easily.         Objective:    Vitals:  06/10/22 0829  BP: (!) 177/76  Pulse: 75  Resp: 20  Temp: 98.1 F (36.7 C)  SpO2: 97%      Physical Exam Neck:     Vascular: No carotid bruit.     Comments: Well-healed left neck incision Cardiovascular:     Rate and Rhythm: Normal rate.  Neurological:     General: No focal deficit present.     Mental Status: He is alert and oriented to person, place, and time.  Psychiatric:        Mood and Affect: Mood normal.      Data: Right Carotid Findings:  +----------+--------+--------+--------+-------------------------+--------+            PSV cm/sEDV cm/sStenosisPlaque Description       Comments  +----------+--------+--------+--------+-------------------------+--------+  CCA Prox  83      10              homogeneous                        +----------+--------+--------+--------+-------------------------+--------+  CCA Mid   67      13              homogeneous                        +----------+--------+--------+--------+-------------------------+--------+  CCA Distal43      9               heterogenous                       +----------+--------+--------+--------+-------------------------+--------+  ICA Prox  437     138     80-99%  heterogenous and calcific          +----------+--------+--------+--------+-------------------------+--------+  ICA Mid   97      23                                                 +----------+--------+--------+--------+-------------------------+--------+  ICA Distal73      19                                                 +----------+--------+--------+--------+-------------------------+--------+  ECA       94      4               heterogenous                       +----------+--------+--------+--------+-------------------------+--------+   +----------+--------+-------+----------------+-------------------+            PSV cm/sEDV  cmsDescribe        Arm Pressure (mmHG)  +----------+--------+-------+----------------+-------------------+  Subclavian201            Multiphasic, [id number]                  +----------+--------+-------+----------------+-------------------+   +---------+--------+--+--------+--+---------+  VertebralPSV cm/s55EDV cm/s11Antegrade  +---------+--------+--+--------+--+---------+       Left Carotid Findings:  +----------+--------+--------+--------+------------------+--------+            PSV cm/sEDV cm/sStenosisPlaque DescriptionComments  +----------+--------+--------+--------+------------------+--------+  CCA Prox  105     17                                          +----------+--------+--------+--------+------------------+--------+  CCA Mid   81      15              heterogenous                +----------+--------+--------+--------+------------------+--------+  CCA Distal78      18                                          +----------+--------+--------+--------+------------------+--------+  ICA Prox  60      17      Normal                              +----------+--------+--------+--------+------------------+--------+  ICA Mid   73      21                                          +----------+--------+--------+--------+------------------+--------+  ICA Distal89      29                                          +----------+--------+--------+--------+------------------+--------+  ECA       56                                                  +----------+--------+--------+--------+------------------+--------+   +----------+--------+--------+----------------+-------------------+            PSV cm/sEDV cm/sDescribe        Arm Pressure (mmHG)  +----------+--------+--------+----------------+-------------------+  Subclavian203             Multiphasic, [id number]                   +----------+--------+--------+----------------+-------------------+   +---------+--------+--+--------+-+---------+  VertebralPSV cm/s30EDV cm/s5Antegrade  +---------+--------+--+--------+-+---------+       Left Stent(s):  +---------------+--+--++++  Prox to Stent  7217  +---------------+--+--++++  Proximal Stent 7314  +---------------+--+--++++  Mid Stent      6118  +---------------+--+--++++  Distal Stent   7321  +---------------+--+--++++  Distal to [id number]  +---------------+--+--++++                Summary:  Right Carotid: Velocities in the right ICA are consistent with a 80-99%                 stenosis.   Left Carotid: Patent stent with no evidence of restenosis.   Vertebrals:  Bilateral vertebral arteries demonstrate antegrade flow.  Subclavians: Normal flow hemodynamics were seen in bilateral subclavian               arteries.  Assessment/Plan:     81 year old male status post left transcarotid artery stenting now plan for right transcarotid artery stenting.  Risks, benefits and alternatives all discussed and will plan R TCAR.        Waynetta Sandy MD [facility]

## 2022-06-10 NOTE — Anesthesia Postprocedure Evaluation (Signed)
Anesthesia Post Note  Patient: Peter [name]  Procedure(s) Performed: Right Transcarotid Artery Revascularization (Right: Neck) ULTRASOUND GUIDANCE FOR VASCULAR ACCESS, LEFT FEMORAL VEIN (Left: Groin)     Patient location during evaluation: PACU Anesthesia Type: General Level of consciousness: awake and alert Pain management: pain level controlled Vital Signs Assessment: post-procedure vital signs reviewed and stable Respiratory status: spontaneous breathing, nonlabored ventilation and respiratory function stable Cardiovascular status: stable and blood pressure returned to baseline Anesthetic complications: no   No notable events documented.  Last Vitals:  Vitals:   06/10/22 1430 06/10/22 1445  BP: (!) 153/77 (!) 152/75  Pulse: 69 69  Resp: 14 13  Temp:    SpO2: 99% 94%    Last Pain:  Vitals:   06/10/22 1445  TempSrc:   PainSc: 0-No pain                 [name]

## 2022-06-10 NOTE — Transfer of Care (Signed)
Immediate Anesthesia Transfer of Care Note  Patient: Peter [name]  Procedure(s) Performed: Right Transcarotid Artery Revascularization (Right: Neck) ULTRASOUND GUIDANCE FOR VASCULAR ACCESS, LEFT FEMORAL VEIN (Left: Groin)  Patient Location: PACU  Anesthesia Type:General  Level of Consciousness: drowsy  Airway & Oxygen Therapy: Patient Spontanous Breathing and Patient connected to face mask oxygen  Post-op Assessment: Report given to RN and Post -op Vital signs reviewed and stable  Post vital signs: Reviewed and stable  Last Vitals:  Vitals Value Taken Time  BP    Temp    Pulse 77 06/10/22 1341  Resp    SpO2 98 % 06/10/22 1341  Vitals shown include unvalidated device data.  Last Pain:  Vitals:   06/10/22 0859  TempSrc:   PainSc: 0-No pain         Complications: No notable events documented.

## 2022-06-10 NOTE — Progress Notes (Signed)
[facility]  Subjective  - Not much pain, no SOB    Objective (!) 165/84 69 97.6 F (36.4 C) (Oral) 16 97%  Intake/Output Summary (Last 24 hours) at 06/10/2022 1720 Last data filed at 06/10/2022 1316 Gross per 24 hour  Intake 800 ml  Output 50 ml  Net 750 ml       Moving all 4 extremities, small hematoma Speech is clear, non labored breathing No neurologic deficits General no acute distress  Assessment/Planning: TCAR for asymptomatic right ICA stenosis  Will check on him later this evening Continue Britilinta and ASA for stent protection.  Peter [name] 06/10/2022 5:20 PM --  Laboratory Lab Results: Recent Labs    06/10/22 1510  WBC 13.7*  HGB 13.1  HCT 37.9*  PLT 120*   BMET Recent Labs    06/10/22 1510  CREATININE 1.61*    COAG Lab Results  Component Value Date   INR 1.2 06/03/2022   INR 1.2 03/18/2022   INR 1.2 02/11/2022   No results found for: "PTT"

## 2022-06-10 NOTE — Progress Notes (Signed)
Patient with instructions to take '50mg'$  prednisone and '50mg'$  benadryl 1 hour before surgery. Patient brought '25mg'$  benadryl to hospital, Dr. Donzetta Matters gave verbal order to give another '25mg'$  IV in addition to patient's '25mg'$  PO.

## 2022-06-10 NOTE — Op Note (Signed)
Patient name: Peter [name] MRN: [id number] DOB: Aug 30, 1941 Sex: male  06/10/2022 Pre-operative Diagnosis: asymptomatic right carotid stenosis Post-operative diagnosis:  Same Surgeon:  Erlene Quan C. Donzetta Matters, MD Assistant: Laurence Slate, PA Procedure Performed: 1.  US guided cannulation of right common femoral vein with placement of 8 French venous return sheath 2.  Placement of Right ICA 10 x 16m EnRoute stent via common carotid sheath with flow reversal neuroprotection  Indications: 81year old male with history of left transcarotid artery stenting now with high-grade right ICA stenosis indicated for transcarotid artery stenting.  Findings: The common carotid artery had approximately 90% stenosis after stenting this was resolved to 0%.  Patient was neurologically intact upon awakening from anesthesia.   Procedure:  The patient was identified in the holding area and taken to the operating room where is placed supine operative table general anesthesia was induced.  He was sterilely prepped and draped in the right neck and chest and bilateral groins in usual fashion, antibiotics were minister timeout was called.  We began using ultrasound to identify the left common femoral vein and this was cannulated with a micropuncture needle followed by wire and sheath.  A Bentson wire was placed followed by an 8 [place] cannula and this was sutured in place to the skin and flushed with heparinized saline.  Similarly vertical incision was made in the neck between the 2 heads of sternocleidomastoid.  Cautery was used to dissect down through the platysma and then the 2 heads of the sternocleidomastoid were bluntly dissected the IJ was identified and retracted laterally.  The common carotid artery was identified patient was fully heparinized.  I placed an umbilical tape and vessel loop around the common carotid artery itself.  A 5-0 Prolene U stitch was then placed.  The artery was cannulated with a micropuncture  needle followed by a sheet 3 cm and catheter 3 cm and angiography was performed.  A J-wire was placed to the carotid bifurcation and the flow reversal sheath was placed without complication and sutured to the skin in the drapes in 2 separate locations.  Flow reversal was initiated confirmed with flushing of the sheath.  The ACT returned over 300 and the blood pressure was stably over mean arterial of 100.  After this a TCAR timeout was performed.  A 6 mm balloon was used to direct an 014 wire into the intracranial ICA.  The lesion was then predilated with a 6 x 30 mm balloon and then primarily stented with a 10 x 40 stent.  We allowed washout for 2 minutes then performed angiography in 2 views which demonstrated patency of the stent with 0% residual stenosis.  Satisfied with this the wire was removed and the clamp was removed as well.  Flow reversal was discontinued and the bowel was returned to the groin.  The U stitch was cinched down and the sheath was removed and Doppler confirmed adequate flow in the common carotid artery.  50 mg of protamine was administered.  Sheath was removed from the left groin pressure held for 10 minutes and hemostasis was adequate.  In the right neck we obtain hemostasis thoroughly irrigated the wound and closed the platysma with Vicryl and the skin with Monocryl.  Dermabond was placed at the skin level.  He was then awakened from anesthesia having tolerated procedure without any complication.  All counts were correct at completion.  He was neurologically intact transferred to the PACU in stable condition.  Given the complexity of the  case,  the assistant was necessary in order to expedite the procedure and safely perform the technical aspects of the operation.  The assistant provided traction and countertraction to assist with exposure of the common carotid artery and facilitate wire, balloon and stent exchange.    These skills could not have been adequately performed by a scrub tech  assistant.     EBL: 50cc  Contrast: 10cc    Wilmer Santillo C. Donzetta Matters, MD [facility] Office: [phone] Pager: [phone]

## 2022-06-10 NOTE — Anesthesia Procedure Notes (Signed)
Procedure Name: Intubation Date/Time: 06/10/2022 12:18 PM  Performed by: Vonna Drafts, CRNAPre-anesthesia Checklist: Patient identified, Emergency Drugs available, Suction available and Patient being monitored Patient Re-evaluated:Patient Re-evaluated prior to induction Oxygen Delivery Method: Circle system utilized Preoxygenation: Pre-oxygenation with 100% oxygen Induction Type: IV induction Ventilation: Mask ventilation without difficulty Laryngoscope Size: Mac and 4 Grade View: Grade I Tube type: Oral Tube size: 7.5 mm Number of attempts: 1 Airway Equipment and Method: Stylet Placement Confirmation: ETT inserted through vocal cords under direct vision, positive ETCO2 and breath sounds checked- equal and bilateral Secured at: 22 cm Tube secured with: Tape Dental Injury: Teeth and Oropharynx as per pre-operative assessment

## 2022-06-11 ENCOUNTER — Encounter (HOSPITAL_COMMUNITY): Payer: Self-pay | Admitting: Vascular Surgery

## 2022-06-11 LAB — CBC
HCT: 34.7 % — ABNORMAL LOW (ref 39.0–52.0)
Hemoglobin: 12 g/dL — ABNORMAL LOW (ref 13.0–17.0)
MCH: 32.5 pg (ref 26.0–34.0)
MCHC: 34.6 g/dL (ref 30.0–36.0)
MCV: 94 fL (ref 80.0–100.0)
Platelets: 126 10*3/uL — ABNORMAL LOW (ref 150–400)
RBC: 3.69 MIL/uL — ABNORMAL LOW (ref 4.22–5.81)
RDW: 13 % (ref 11.5–15.5)
WBC: 14.1 10*3/uL — ABNORMAL HIGH (ref 4.0–10.5)
nRBC: 0 % (ref 0.0–0.2)

## 2022-06-11 LAB — BASIC METABOLIC PANEL
Anion gap: 12 (ref 5–15)
BUN: 23 mg/dL (ref 8–23)
CO2: 19 mmol/L — ABNORMAL LOW (ref 22–32)
Calcium: 8.7 mg/dL — ABNORMAL LOW (ref 8.9–10.3)
Chloride: 105 mmol/L (ref 98–111)
Creatinine, Ser: 1.71 mg/dL — ABNORMAL HIGH (ref 0.61–1.24)
GFR, Estimated: 40 mL/min — ABNORMAL LOW (ref >60)
Glucose, Bld: 206 mg/dL — ABNORMAL HIGH (ref 70–99)
Potassium: 4.1 mmol/L (ref 3.5–5.1)
Sodium: 136 mmol/L (ref 135–145)

## 2022-06-11 LAB — LIPID PANEL
Cholesterol: 78 mg/dL (ref 0–200)
HDL: 38 mg/dL — ABNORMAL LOW (ref >40)
LDL Cholesterol: 30 mg/dL (ref 0–99)
Total CHOL/HDL Ratio: 2.1 RATIO
Triglycerides: 50 mg/dL (ref <150)
VLDL: 10 mg/dL (ref 0–40)

## 2022-06-11 MED ORDER — TAMSULOSIN HCL 0.4 MG PO CAPS
0.4000 mg | ORAL_CAPSULE | Freq: Every day | ORAL | Status: DC
  Administered 2022-06-11 – 2022-06-12 (×2): 0.4 mg via ORAL
  Filled 2022-06-11 (×2): qty 1

## 2022-06-11 NOTE — Progress Notes (Signed)
Patient not voided since pre op.bladder scan showed 500+ on scanning,Dr Unk Lightning made aware. In and out catheterization done and obtain 700 cc of urine output .Will continue to monitor patient.

## 2022-06-11 NOTE — Progress Notes (Signed)
Pt complaining of bladder pain and only able to void 14m independently. Bladder scan shows 5756mof urine present in bladder. In and out cath emptied 900 mL of urine. Pt felt immediate relief.

## 2022-06-11 NOTE — Progress Notes (Addendum)
[facility]  Subjective  - Difficulty voiding   Objective (!) 103/49 (!) 59 98.2 F (36.8 C) (Oral) 16 98%  Intake/Output Summary (Last 24 hours) at 06/11/2022 0813 Last data filed at 06/11/2022 0357 Gross per 24 hour  Intake 800 ml  Output 750 ml  Net 50 ml    The right neck incision appears stable with mild edema and no frank expanding hematoma. Speech is clear, no neurologic deficits of tongue deviation or facial droop Moving all 4 extremities.     Assessment/Planning: POD # 1 right CEA for asymptomatic carotid stenosis with history of previous left TCAR.    Bladder scan showed 500+.  In and out catheterization done and obtain 700 cc of urine output.  I ordered Flomax 0.4 mg daily.  He will ambulate and drink plenty of oral fluids.  Plan to keep him here for another 24 hours.  Once he is able to void independently he will be discharge home.    The right neck incision appears stable without frank hematoma.  Peter [name] 06/11/2022 8:13 AM -- VASCULAR STAFF ADDENDUM: I have independently interviewed and examined the patient. I agree with the above.  Will watch hematoma one more day  Due to void, will insert foley after a total of two straight caths  Cassandria Santee, MD [facility] Phone Number: [phone] 06/11/2022 10:30 AM   Laboratory Lab Results: Recent Labs    06/10/22 1510 06/11/22 0430  WBC 13.7* 14.1*  HGB 13.1 12.0*  HCT 37.9* 34.7*  PLT 120* 126*   BMET Recent Labs    06/10/22 1510 06/11/22 0430  NA  --  136  K  --  4.1  CL  --  105  CO2  --  19*  GLUCOSE  --  206*  BUN  --  23  CREATININE 1.61* 1.71*  CALCIUM  --  8.7*    COAG Lab Results  Component Value Date   INR 1.2 06/03/2022   INR 1.2 03/18/2022   INR 1.2 02/11/2022   No results found for: "PTT"

## 2022-06-11 NOTE — Progress Notes (Signed)
PHARMACIST LIPID MONITORING   Peter [name] is a 81 y.o. male admitted on 06/10/2022 for right transcarotid artery stenting.  Pharmacy has been consulted to optimize lipid-lowering therapy with the indication of secondary prevention for clinical ASCVD.  Recent Labs:  Lipid Panel (last 6 months):   Lab Results  Component Value Date   CHOL 78 06/11/2022   TRIG 50 06/11/2022   HDL 38 (L) 06/11/2022   CHOLHDL 2.1 06/11/2022   VLDL 10 06/11/2022   LDLCALC 30 06/11/2022    Hepatic function panel (last 6 months):   Lab Results  Component Value Date   AST 24 06/03/2022   ALT 16 06/03/2022   ALKPHOS 61 06/03/2022   BILITOT 0.9 06/03/2022    SCr (since admission):   Serum creatinine: 1.71 mg/dL (H) 06/11/22 0430 Estimated creatinine clearance: 37.8 mL/min (A)  Current therapy and lipid therapy tolerance Current lipid-lowering therapy: rosuvastatin 40 mg PO daily Previous lipid-lowering therapies (if applicable): N/A Documented or reported allergies or intolerances to lipid-lowering therapies (if applicable): N/A  Assessment:   Patient's current lipid therapy is appropriate meeting goal LDL <55.   Plan:    1.Statin intensity (high intensity recommended for all patients regardless of the LDL):  No statin changes. The patient is already on a high intensity statin.  2.Add ezetimibe (if any one of the following):   Not indicated at this time.  3.Refer to lipid clinic:   No  4.Follow-up with:  Cardiology provider - Carlyle Dolly, MD  5.Follow-up labs after discharge:  No changes in lipid therapy, repeat a lipid panel in one year.      Eliseo Gum, PharmD PGY1 Pharmacy Resident   06/11/2022  2:02 PM

## 2022-06-12 LAB — GLUCOSE, CAPILLARY: Glucose-Capillary: 143 mg/dL — ABNORMAL HIGH (ref 70–99)

## 2022-06-12 MED ORDER — TAMSULOSIN HCL 0.4 MG PO CAPS: 0.4000 mg | ORAL_CAPSULE | Freq: Every day | ORAL | 0 refills | Status: DC

## 2022-06-12 MED ORDER — OXYCODONE HCL 5 MG PO TABS: 5.0000 mg | ORAL_TABLET | Freq: Four times a day (QID) | ORAL | 0 refills | Status: DC | PRN

## 2022-06-12 NOTE — Progress Notes (Addendum)
[facility] [place]  Subjective  - Doing better and able to void independently   Objective (!) 121/54 60 97.6 F (36.4 C) (Oral) 20 96%  Intake/Output Summary (Last 24 hours) at 06/12/2022 0834 Last data filed at 06/12/2022 0411 Gross per 24 hour  Intake 680 ml  Output 2775 ml  Net -2095 ml    Moving all 4 ext No neurologic deficits Right neck incision with ecchymosis, non expanding mild edema Speech clear and swallowing independently   Assessment/Planning: POD # 2 right TCAR for asymptomatic carotid stenosis with history of previous left TCAR.    Flomax daily, re scan bladder prior to leave.  He will call his PCP for follow.  I will prescribe 30 days of Flow max. The right neck incision appears stable without frank hematoma. Plan to discharge today if independent voiding and bladder scan < 300 cc. Asymptomatic leukocytosis will order IS to bedside No acute distress SAT 98 on RA  Discharge medication for maximum medical therapy: ASA, Brilinita, Crestor and Flomax.    F/U in 2 weeks for incision check due to edema and 4 weeks for duplex.  Roxy Horseman 06/12/2022 8:34 AM --  VASCULAR STAFF ADDENDUM: I have independently interviewed and examined the patient. I agree with the above.  Hematoma stable, urinating,no deficits, home today 2wk wound check   Cassandria Santee, MD [facility] Phone Number: [phone] 06/12/2022 10:45 AM    Laboratory Lab Results: Recent Labs    06/10/22 1510 06/11/22 0430  WBC 13.7* 14.1*  HGB 13.1 12.0*  HCT 37.9* 34.7*  PLT 120* 126*   BMET Recent Labs    06/10/22 1510 06/11/22 0430  NA  --  136  K  --  4.1  CL  --  105  CO2  --  19*  GLUCOSE  --  206*  BUN  --  23  CREATININE 1.61* 1.71*  CALCIUM  --  8.7*    COAG Lab Results  Component Value Date   INR 1.2 06/03/2022   INR 1.2 03/18/2022   INR 1.2 02/11/2022   No results found for:  "PTT"

## 2022-06-12 NOTE — Plan of Care (Signed)
  Problem: Clinical Measurements: Goal: Will remain free from infection Outcome: Progressing Goal: Diagnostic test results will improve Outcome: Progressing   

## 2022-06-12 NOTE — Discharge Instructions (Signed)
   [facility] [place]  Discharge Instructions   Carotid Endarterectomy (CEA)  Please refer to the following instructions for your post-procedure care. Your surgeon or physician assistant will discuss any changes with you.  Activity  You are encouraged to walk as much as you can. You can slowly return to normal activities but must avoid strenuous activity and heavy lifting until your doctor tell you it's OK. Avoid activities such as vacuuming or swinging a golf club. You can drive after one week if you are comfortable and you are no longer taking prescription pain medications. It is normal to feel tired for serval weeks after your surgery. It is also normal to have difficulty with sleep habits, eating, and bowel movements after surgery. These will go away with time.  Bathing/Showering  You may shower after you come home. Do not soak in a bathtub, hot tub, or swim until the incision heals completely.  Incision Care  Shower every day. Clean your incision with mild soap and water. Pat the area dry with a clean towel. You do not need a bandage unless otherwise instructed. Do not apply any ointments or creams to your incision. You may have skin glue on your incision. Do not peel it off. It will come off on its own in about one week. Your incision may feel thickened and raised for several weeks after your surgery. This is normal and the skin will soften over time. For Men Only: It's OK to shave around the incision but do not shave the incision itself for 2 weeks. It is common to have numbness under your chin that could last for several months.  Diet  Resume your normal diet. There are no special food restrictions following this procedure. A low fat/low cholesterol diet is recommended for all patients with vascular disease. In order to heal from your surgery, it is CRITICAL to get adequate nutrition. Your body requires vitamins, minerals, and protein. Vegetables are the best  source of vitamins and minerals. Vegetables also provide the perfect balance of protein. Processed food has little nutritional value, so try to avoid this.        Medications  Resume taking all of your medications unless your doctor or physician assistant tells you not to. If your incision is causing pain, you may take over-the- counter pain relievers such as acetaminophen (Tylenol). If you were prescribed a stronger pain medication, please be aware these medications can cause nausea and constipation. Prevent nausea by taking the medication with a snack or meal. Avoid constipation by drinking plenty of fluids and eating foods with a high amount of fiber, such as fruits, vegetables, and grains. Do not take Tylenol if you are taking prescription pain medications.  Follow Up  Our office will schedule a follow up appointment 2-3 weeks following discharge.  Please call us immediately for any of the following conditions  Increased pain, redness, drainage (pus) from your incision site. Fever of 101 degrees or higher. If you should develop stroke (slurred speech, difficulty swallowing, weakness on one side of your body, loss of vision) you should call 911 and go to the nearest emergency room.  Reduce your risk of vascular disease:  Stop smoking. If you would like help call [facility] at 1-800-QUIT-NOW (1-800-784-8669) or [place] at [phone]. Manage your cholesterol Maintain a desired weight Control your diabetes Keep your blood pressure down  If you have any questions, please call the office at [phone].   

## 2022-06-13 ENCOUNTER — Encounter (HOSPITAL_COMMUNITY): Payer: Self-pay

## 2022-06-13 ENCOUNTER — Other Ambulatory Visit: Payer: Self-pay

## 2022-06-13 ENCOUNTER — Emergency Department (HOSPITAL_COMMUNITY)
Admission: EM | Admit: 2022-06-13 | Discharge: 2022-06-13 | Disposition: A | Discharge status: Home / Self Care | Payer: Medicare Other | Attending: Student | Admitting: Student

## 2022-06-13 DIAGNOSIS — I499 Cardiac arrhythmia, unspecified: Secondary | ICD-10-CM | POA: Diagnosis not present

## 2022-06-13 DIAGNOSIS — I130 Hypertensive heart and chronic kidney disease with heart failure and stage 1 through stage 4 chronic kidney disease, or unspecified chronic kidney disease: Secondary | ICD-10-CM | POA: Insufficient documentation

## 2022-06-13 DIAGNOSIS — R1030 Lower abdominal pain, unspecified: Secondary | ICD-10-CM | POA: Insufficient documentation

## 2022-06-13 DIAGNOSIS — N3001 Acute cystitis with hematuria: Secondary | ICD-10-CM | POA: Diagnosis not present

## 2022-06-13 DIAGNOSIS — Z87891 Personal history of nicotine dependence: Secondary | ICD-10-CM | POA: Insufficient documentation

## 2022-06-13 DIAGNOSIS — I959 Hypotension, unspecified: Secondary | ICD-10-CM | POA: Diagnosis not present

## 2022-06-13 DIAGNOSIS — Z7982 Long term (current) use of aspirin: Secondary | ICD-10-CM | POA: Diagnosis not present

## 2022-06-13 DIAGNOSIS — Z743 Need for continuous supervision: Secondary | ICD-10-CM | POA: Diagnosis not present

## 2022-06-13 DIAGNOSIS — Z79899 Other long term (current) drug therapy: Secondary | ICD-10-CM | POA: Diagnosis not present

## 2022-06-13 DIAGNOSIS — I5022 Chronic systolic (congestive) heart failure: Secondary | ICD-10-CM | POA: Diagnosis not present

## 2022-06-13 DIAGNOSIS — R0689 Other abnormalities of breathing: Secondary | ICD-10-CM | POA: Diagnosis not present

## 2022-06-13 DIAGNOSIS — J449 Chronic obstructive pulmonary disease, unspecified: Secondary | ICD-10-CM | POA: Insufficient documentation

## 2022-06-13 DIAGNOSIS — I2510 Atherosclerotic heart disease of native coronary artery without angina pectoris: Secondary | ICD-10-CM | POA: Insufficient documentation

## 2022-06-13 DIAGNOSIS — R6889 Other general symptoms and signs: Secondary | ICD-10-CM | POA: Diagnosis not present

## 2022-06-13 DIAGNOSIS — R569 Unspecified convulsions: Secondary | ICD-10-CM | POA: Diagnosis not present

## 2022-06-13 DIAGNOSIS — D649 Anemia, unspecified: Secondary | ICD-10-CM | POA: Diagnosis not present

## 2022-06-13 DIAGNOSIS — Z951 Presence of aortocoronary bypass graft: Secondary | ICD-10-CM | POA: Diagnosis not present

## 2022-06-13 DIAGNOSIS — N1831 Chronic kidney disease, stage 3a: Secondary | ICD-10-CM | POA: Diagnosis not present

## 2022-06-13 DIAGNOSIS — R531 Weakness: Secondary | ICD-10-CM

## 2022-06-13 LAB — COMPREHENSIVE METABOLIC PANEL
ALT: 22 U/L (ref 0–44)
AST: 22 U/L (ref 15–41)
Albumin: 3.4 g/dL — ABNORMAL LOW (ref 3.5–5.0)
Alkaline Phosphatase: 60 U/L (ref 38–126)
Anion gap: 7 (ref 5–15)
BUN: 29 mg/dL — ABNORMAL HIGH (ref 8–23)
CO2: 24 mmol/L (ref 22–32)
Calcium: 8.4 mg/dL — ABNORMAL LOW (ref 8.9–10.3)
Chloride: 107 mmol/L (ref 98–111)
Creatinine, Ser: 1.75 mg/dL — ABNORMAL HIGH (ref 0.61–1.24)
GFR, Estimated: 39 mL/min — ABNORMAL LOW (ref >60)
Glucose, Bld: 132 mg/dL — ABNORMAL HIGH (ref 70–99)
Potassium: 3.9 mmol/L (ref 3.5–5.1)
Sodium: 138 mmol/L (ref 135–145)
Total Bilirubin: 0.9 mg/dL (ref 0.3–1.2)
Total Protein: 6.4 g/dL — ABNORMAL LOW (ref 6.5–8.1)

## 2022-06-13 LAB — URINALYSIS, ROUTINE W REFLEX MICROSCOPIC
Bilirubin Urine: NEGATIVE
Glucose, UA: NEGATIVE mg/dL
Ketones, ur: NEGATIVE mg/dL
Nitrite: POSITIVE — AB
Protein, ur: 100 mg/dL — AB
Specific Gravity, Urine: 1.012 (ref 1.005–1.030)
WBC, UA: >50 WBC/hpf — ABNORMAL HIGH (ref 0–5)
pH: 8 (ref 5.0–8.0)

## 2022-06-13 LAB — CBC WITH DIFFERENTIAL/PLATELET
Abs Immature Granulocytes: 0.03 10*3/uL (ref 0.00–0.07)
Basophils Absolute: 0 10*3/uL (ref 0.0–0.1)
Basophils Relative: 0 %
Eosinophils Absolute: 0.3 10*3/uL (ref 0.0–0.5)
Eosinophils Relative: 4 %
HCT: 33.1 % — ABNORMAL LOW (ref 39.0–52.0)
Hemoglobin: 11.2 g/dL — ABNORMAL LOW (ref 13.0–17.0)
Immature Granulocytes: 0 %
Lymphocytes Relative: 31 %
Lymphs Abs: 2.8 10*3/uL (ref 0.7–4.0)
MCH: 32.8 pg (ref 26.0–34.0)
MCHC: 33.8 g/dL (ref 30.0–36.0)
MCV: 97.1 fL (ref 80.0–100.0)
Monocytes Absolute: 0.9 10*3/uL (ref 0.1–1.0)
Monocytes Relative: 10 %
Neutro Abs: 5.1 10*3/uL (ref 1.7–7.7)
Neutrophils Relative %: 55 %
Platelets: 105 10*3/uL — ABNORMAL LOW (ref 150–400)
RBC: 3.41 MIL/uL — ABNORMAL LOW (ref 4.22–5.81)
RDW: 13.2 % (ref 11.5–15.5)
WBC: 9.2 10*3/uL (ref 4.0–10.5)
nRBC: 0 % (ref 0.0–0.2)

## 2022-06-13 LAB — TROPONIN I (HIGH SENSITIVITY): Troponin I (High Sensitivity): 12 ng/L (ref <18)

## 2022-06-13 MED ORDER — SULFAMETHOXAZOLE-TRIMETHOPRIM 800-160 MG PO TABS
1.0000 | ORAL_TABLET | Freq: Two times a day (BID) | ORAL | 0 refills | Status: AC
Start: 2022-06-13 — End: 2022-06-20

## 2022-06-13 MED ORDER — LACTATED RINGERS IV BOLUS
1000.0000 mL | Freq: Once | INTRAVENOUS | Status: AC
  Administered 2022-06-13: 1000 mL via INTRAVENOUS

## 2022-06-13 MED ORDER — SULFAMETHOXAZOLE-TRIMETHOPRIM 800-160 MG PO TABS
1.0000 | ORAL_TABLET | Freq: Once | ORAL | Status: AC
  Administered 2022-06-13: 1 via ORAL
  Filled 2022-06-13: qty 1

## 2022-06-13 NOTE — Discharge Summary (Signed)
[facility] Discharge Summary   Patient ID:  Peter [name] MRN: [id number] DOB/AGE: [birth year] 81 y.o.  Admit date: 06/10/2022 Discharge date: 06/12/22 Date of Surgery: 06/10/2022 Surgeon: Surgeon(s): Waynetta Sandy, MD  Admission Diagnosis: Carotid stenosis [I65.29]  Discharge Diagnoses:  Carotid stenosis [I65.29]  Secondary Diagnoses: Past Medical History:  Diagnosis Date   AAA (abdominal aortic aneurysm) ([place])    Arthritis    CAD (coronary artery disease)    a. s/p CABG in 1997 b. stent to SVG-PDA and PTCA of LAD in 2003 c. cath in 2015 showing patent SVG-OM1-OM2, SVG-RI, SVG-D1, and LIMA-LAD with occluded SVG-PDA and severe stenosis of mid-LAD at LIMA insertion with DES placed d. 04/2018: similar results to 2015 with patent LAD stent; occlusion of small caliber D1 stenosis with medical management recommended.    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   Myocardial infarction [facility]) 1997   stents x2 (2003/2007)    Procedure(s): Right Transcarotid Artery Revascularization ULTRASOUND GUIDANCE FOR VASCULAR ACCESS, LEFT FEMORAL VEIN  Discharged Condition: stable  HPI: Peter [name] is a 81 y.o. 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  He is here today for TCAR of the right ICA.   Hospital Course:  Peter [name] is a 81 y.o. male is S/P Right Procedure(s): Right Transcarotid Artery  Revascularization ULTRASOUND GUIDANCE FOR VASCULAR ACCESS, LEFT FEMORAL VEIN for asymptomatic right carotid stenosis.  No speech or swallowing difficulties.  Moving all extremities.  He developed mild edema surrounding the incision without frank hematoma.  He has no neuro logic deficits.  He had voiding issues requiring in & O cath and he was placed on flow max and he will f/u with his PCP.  He was voiding well prior to discharge.  Stable for discharge post op day 2.    Significant Diagnostic Studies: CBC Lab Results  Component Value Date   WBC 14.1 (H) 06/11/2022   HGB 12.0 (L) 06/11/2022   HCT 34.7 (L) 06/11/2022   MCV 94.0 06/11/2022   PLT 126 (L) 06/11/2022    BMET    Component Value Date/Time   NA 136 06/11/2022 0430   NA 140 11/22/2021 1449   K 4.1 06/11/2022 0430   CL 105 06/11/2022 0430   CO2 19 (L) 06/11/2022 0430   GLUCOSE 206 (H) 06/11/2022 0430   BUN 23 06/11/2022 0430   BUN 22 11/22/2021 1449   CREATININE 1.71 (H) 06/11/2022 0430   CREATININE 1.05 08/06/2014 1146   CALCIUM 8.7 (L) 06/11/2022 0430   GFRNONAA 40 (L) 06/11/2022 0430   GFRAA 58 (L) 11/17/2020 1508   COAG Lab Results  Component Value Date   INR 1.2 06/03/2022   INR 1.2 03/18/2022   INR 1.2 02/11/2022     Disposition:  Discharge to :Home Discharge Instructions     Call MD for:  redness, tenderness, or signs of infection (pain, swelling, bleeding, redness, odor  or green/yellow discharge around incision site)   Complete by: As directed    Call MD for:  severe or increased pain, loss or decreased feeling  in affected limb(s)   Complete by: As directed    Call MD for:  temperature >100.5   Complete by: As directed    Resume previous diet   Complete by: As directed       Allergies as of 06/12/2022       Reactions   Dye Fdc Red [red Dye] Hives   hives   Ivp Dye [iodinated Contrast Media] Hives   Plavix [clopidogrel] Hives   Unsure if dye or Plavix   Keflex [cephalexin] Other (See  Comments)   hoarsness        Medication List     TAKE these medications    Accu-Chek Guide test strip Generic drug: glucose blood USE  STRIP TO CHECK GLUCOSE TWICE DAILY DUE  TO  FLUCTUATING  SUGARS   acetaminophen 500 MG tablet Commonly known as: TYLENOL Take 1,000 mg by mouth every 6 (six) hours as needed for mild pain.   Aloe Vera Juice Liqd Take 8 oz by mouth daily.   aspirin EC 81 MG tablet Take 1 tablet (81 mg total) by mouth daily with breakfast.   diphenhydrAMINE 50 MG capsule Commonly known as: BENADRYL Take one tablet by mouth 1 hour prior to procedure   HYDROcodone-acetaminophen 10-325 MG tablet Commonly known as: NORCO Take 1 tablet by mouth 2 (two) times daily as needed for moderate pain or severe pain.   losartan 25 MG tablet Commonly known as: COZAAR Take 1 tablet (25 mg total) by mouth daily.   metoprolol succinate 25 MG 24 hr tablet Commonly known as: TOPROL-XL Take 1 tablet (25 mg total) by mouth daily.   oxyCODONE 5 MG immediate release tablet Commonly known as: Oxy IR/ROXICODONE Take 1 tablet (5 mg total) by mouth every 6 (six) hours as needed for moderate pain.   oxyCODONE-acetaminophen 5-325 MG tablet Commonly known as: PERCOCET/ROXICET Take 1 tablet by mouth every 6 (six) hours as needed for moderate pain.   predniSONE 50 MG tablet Commonly known as: DELTASONE Take 1 tablet by mouth 13 hours prior to procedure, then one tablet by mouth 7 hours prior to procedure and one tablet by mouth 1 hour prior to procedure   rosuvastatin 40 MG tablet Commonly known as: CRESTOR Take 1 tablet (40 mg total) by mouth daily.   tamsulosin 0.4 MG Caps capsule Commonly known as: FLOMAX Take 1 capsule (0.4 mg total) by mouth daily.   ticagrelor 60 MG Tabs tablet Commonly known as: Brilinta Take 1 tablet (60 mg total) by mouth 2 (two) times daily.   Vitamin D3 250 MCG (10000 UT) Tabs Take 10,000 Units by mouth 3 (three) times a week.   vitamin E  180 MG (400 UNITS) capsule Take 400 Units by mouth See admin instructions. Twice month       Verbal and written Discharge instructions given to the patient. Wound care per Discharge AVS  Follow-up Information     Waynetta Sandy, MD Follow up in 2 week(s).   Specialties: Vascular Surgery, Cardiology Why: Office will call you to arrange your appt (sent) Contact information: [address] [id number] [phone]                 Signed: Roxy Horseman 06/13/2022, 10:37 AM --- For VQI Registry use --- Instructions: Press F2 to tab through selections.  Delete question if not applicable.   Modified Rankin score at D/C (0-6): Rankin Score=0  IV medication needed for:  1. Hypertension: No 2. Hypotension: No  Post-op Complications: No  1. Post-op CVA or TIA: No  If yes: Event classification (right eye, left eye, right cortical, left cortical, verterobasilar, other):   If yes: Timing of event (intra-op, <6 hrs post-op, >=6 hrs post-op, unknown):   2. CN injury: No  If yes: CN  injuried   3. Myocardial infarction: No  If yes: Dx by (EKG or clinical, Troponin):   4.  CHF: No  5.  Dysrhythmia (new): No  6. Wound infection: No  7. Reperfusion symptoms: No  8. Return to OR: No  If yes: return to OR for (bleeding, neurologic, other CEA incision, other):   Discharge medications: Statin use:  Yes ASA use:  Yes Beta blocker use:  Yes ACE-Inhibitor use:  No  for medical reason not indicated [id number] Antagonist use: '[ ]'$  None, '[ ]'$  Plavix, '[ ]'$  Plasugrel, '[ ]'$  Ticlopinine, [x ] Ticagrelor, '[ ]'$  Other, '[ ]'$  No for medical reason, '[ ]'$  Non-compliant, '[ ]'$  Not-indicated Anti-coagulant use:  [ x] None, '[ ]'$  Warfarin, '[ ]'$  Rivaroxaban, '[ ]'$  Dabigatran, '[ ]'$  Other, '[ ]'$  No for medical reason, '[ ]'$  Non-compliant, '[ ]'$  Not-indicated

## 2022-06-13 NOTE — ED Provider Notes (Signed)
[facility] Provider Note  CSN: [id number] Arrival date & time: 06/13/22 1727  Chief Complaint(s) Weakness  HPI Peter [name] is a 81 y.o. male with PMH AAA, CAD status post CABG, CHF, CKD, COPD, postop day 3 from a right transcarotid artery revascularization procedure who presents emergency department for evaluation of generalized weakness and hypotension.  Patient states that he has felt a little weaker since his operation and his family member is a Presenter, broadcasting who took his blood pressure and found it to be slightly low in the high 90s.  Patient states that he has some mild lower crampy abdominal pain but denies chest pain, shortness of breath, headache, fever, nausea, vomiting or other systemic symptoms.  He states that while in the hospital he did have some issues with urinary retention but currently is able to urinate without difficulty.   Past Medical History Past Medical History:  Diagnosis Date   AAA (abdominal aortic aneurysm) ([place])    Arthritis    CAD (coronary artery disease)    a. s/p CABG in 1997 b. stent to SVG-PDA and PTCA of LAD in 2003 c. cath in 2015 showing patent SVG-OM1-OM2, SVG-RI, SVG-D1, and LIMA-LAD with occluded SVG-PDA and severe stenosis of mid-LAD at LIMA insertion with DES placed d. 04/2018: similar results to 2015 with patent LAD stent; occlusion of small caliber D1 stenosis with medical management recommended.    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   Chronic kidney disease, stage 3a ([place]) 02/11/2022   Seborrheic keratoses 06/26/2020   Chest pain 02/24/2020   NSTEMI (non-ST elevated myocardial infarction) ([place]) 02/24/2020   Chronic back pain 07/25/2018   CVA (cerebral vascular accident) ([place]) 04/14/2018   Stroke-like symptoms 04/12/2018   CAD (coronary artery disease) 04/12/2018   Elevated troponin 04/12/2018   Claudication ([place]) 04/12/2018   CAD-CABG '97- multiple PCIs since 08/15/2014    PCI- LIMA-LAD insertion site DES 08/14/14 08/15/2014   COPD (chronic obstructive pulmonary disease) ([place]) 08/15/2014   Allergy to IVP dye 08/15/2014   Plavix allergy 08/15/2014   HTN (hypertension), benign 02/03/2014   AAA (abdominal aortic aneurysm) without rupture ([place]) 02/03/2014   H/O ETOH abuse 04/02/2013   Bowel habit changes 04/02/2013   Dyslipidemia 03/13/2013   Type 2 diabetes mellitus ([place]) 03/13/2013   Pain, abdominal, nonspecific 03/13/2013   Home Medication(s) Prior to Admission medications   Medication Sig Start Date End Date Taking? Authorizing Provider  acetaminophen (TYLENOL) 500 MG tablet Take 1,000 mg by mouth every 6 (six) hours as needed for mild pain.    [provider]  Aloe Vera Juice LIQD Take 8 oz by mouth daily.  [provider]  aspirin EC 81 MG tablet Take 1 tablet (81 mg total) by mouth daily with breakfast. 02/14/22   Roxan Hockey, MD  Cholecalciferol (VITAMIN D3) 250 MCG (10000 UT) TABS Take 10,000 Units by mouth 3 (three) times a week.    [provider]  diphenhydrAMINE (BENADRYL) 50 MG capsule Take one tablet by mouth 1 hour prior to  procedure 05/19/22   Waynetta Sandy, MD  glucose blood (ACCU-CHEK GUIDE) test strip USE  STRIP TO CHECK GLUCOSE TWICE DAILY DUE  TO  FLUCTUATING  SUGARS 05/10/22   Kathyrn Drown, MD  HYDROcodone-acetaminophen (NORCO) 10-325 MG tablet Take 1 tablet by mouth 2 (two) times daily as needed for moderate pain or severe pain. 05/11/22   [provider]  losartan (COZAAR) 25 MG tablet Take 1 tablet (25 mg total) by mouth daily. 02/14/22   Roxan Hockey, MD  metoprolol succinate (TOPROL-XL) 25 MG 24 hr tablet Take 1 tablet (25 mg total) by mouth daily. 02/14/22   Roxan Hockey, MD  oxyCODONE (OXY IR/ROXICODONE) 5 MG immediate release tablet Take 1 tablet (5 mg total) by mouth every 6 (six) hours as needed for moderate pain. 06/12/22   Ulyses Amor, PA-C  oxyCODONE-acetaminophen (PERCOCET/ROXICET) 5-325 MG tablet Take 1 tablet by mouth every 6 (six) hours as needed for moderate pain. Patient not taking: Reported on 05/31/2022 03/26/22   Karoline Caldwell, PA-C  predniSONE (DELTASONE) 50 MG tablet Take 1 tablet by mouth 13 hours prior to procedure, then one tablet by mouth 7 hours prior to procedure and one tablet by mouth 1 hour prior to procedure 05/19/22   Waynetta Sandy, MD  rosuvastatin (CRESTOR) 40 MG tablet Take 1 tablet (40 mg total) by mouth daily. 02/14/22   Roxan Hockey, MD  tamsulosin (FLOMAX) 0.4 MG CAPS capsule Take 1 capsule (0.4 mg total) by mouth daily. 06/13/22   Ulyses Amor, PA-C  ticagrelor (BRILINTA) 60 MG TABS tablet Take 1 tablet (60 mg total) by mouth 2 (two) times daily. 02/14/22   Roxan Hockey, MD  vitamin E 180 MG (400 UNITS) capsule Take 400 Units by mouth See admin instructions. Twice month    [provider]                                                                                                                                    Past Surgical History Past Surgical History:  Procedure Laterality Date   CORONARY ANGIOPLASTY   2003/2007   2 stents   CORONARY ANGIOPLASTY WITH STENT PLACEMENT  08/14/2014   DES to LIMA-LAD insertion   CORONARY ARTERY BYPASS GRAFT  12/06/1995   7 vessels   CORONARY STENT INTERVENTION N/A 02/24/2020   Procedure: CORONARY STENT INTERVENTION;  Surgeon: Sherren Mocha, MD;  Location: [facility];  Service: Cardiovascular;  Laterality: N/A;   CORONARY STENT PLACEMENT  08/15/2014   MID LAD  DES  by Dr Leslye Peer ANGIOGRAPHY N/A 02/24/2020   Procedure: Remus Blake ANGIOGRAPHY;  Surgeon: Sherren Mocha, MD;  Location: [facility];  Service: Cardiovascular;  Laterality: N/A;   CYSTECTOMY  12/06/2003   top of head-[place]   EAR CYST EXCISION  07/09/2012   Procedure: CYST REMOVAL;  Surgeon: Jamesetta So, MD;  Location: [facility];  Service: General;  Laterality: N/A;   EYE SURGERY Right    cataract removal and then another surgery after   HERNIA REPAIR     1980's inguinal Left side   LEFT HEART CATH AND CORS/GRAFTS ANGIOGRAPHY N/A 04/16/2018   Procedure: LEFT HEART CATH AND CORS/GRAFTS ANGIOGRAPHY;  Surgeon: Martinique, Peter M, MD;  Location: [facility];  Service: Cardiovascular;  Laterality: N/A;   LEFT HEART CATHETERIZATION WITH CORONARY/GRAFT ANGIOGRAM N/A 08/14/2014   Procedure: LEFT HEART CATHETERIZATION WITH Beatrix Fetters;  Surgeon: Blane Ohara, MD;  Location[facility] CATH LAB;  Service: Cardiovascular;  Laterality: N/A;   TEE WITHOUT CARDIOVERSION N/A 02/14/2022   Procedure: TRANSESOPHAGEAL ECHOCARDIOGRAM (TEE);  Surgeon: Freada Bergeron, MD;  Location: [facility];  Service: Cardiovascular;  Laterality: N/A;   TRANSCAROTID ARTERY REVASCULARIZATION  Left 03/25/2022   Procedure: LEFT TRANCAROTID ARTERY REVASCULARIZATION USING 3m X 433mENROUTE STENT SYSTEM;  Surgeon: CaWaynetta SandyMD;  [facility] Service: Vascular;  Laterality: Left;   TRANSCAROTID ARTERY REVASCULARIZATION  Right 06/10/2022   Procedure: Right  Transcarotid Artery Revascularization;  Surgeon: CaWaynetta SandyMD;  [facility] Service: Vascular;  Laterality: Right;   ULTRASOUND GUIDANCE FOR VASCULAR ACCESS N/A 03/25/2022   Procedure: ULTRASOUND GUIDANCE FOR VASCULAR ACCESS;  Surgeon: CaWaynetta SandyMD;  [facility] Service: Vascular;  Laterality: N/A;   ULTRASOUND GUIDANCE FOR VASCULAR ACCESS Left 06/10/2022   Procedure: ULTRASOUND GUIDANCE FOR VASCULAR ACCESS, LEFT FEMORAL VEIN;  Surgeon: CaWaynetta SandyMD;  [facility] Service: Vascular;  Laterality: Left;   Family History Family History  Problem Relation Age of Onset   Other Father        deceased after ?TCS or barium enema, age 4651s Hypertension Father    Heart attack Mother    Colon cancer Neg Hx    Liver disease Neg Hx     Social History Social History   Tobacco Use   Smoking status: Former    Packs/day: 3.00    Years: 40.00    Total pack years: 120.00    Types: Cigarettes    Quit date: 07/02/2000    Years since quitting: 21.9   Smokeless tobacco: Never  Vaping Use   Vaping Use: Never used  Substance Use Topics   Alcohol use: Not Currently   Drug use: No   Allergies Dye fdc red [red dye], Ivp dye [iodinated contrast media], Plavix [clopidogrel], and Keflex [cephalexin]  Review of Systems Review of Systems  Gastrointestinal:  Positive for abdominal pain.  Neurological:  Positive for weakness.    Physical Exam Vital Signs  I have reviewed the triage vital signs BP 137/65   Pulse 62   Temp 98.4 F (36.9 C)   Resp 18   Ht 6' (1.829 m)   Wt 79 kg   SpO2 100%   BMI 23.62 kg/m   Physical Exam Vitals and nursing note reviewed.  Constitutional:      General: He is not in acute distress.    Appearance: He is well-developed.  HENT:  Head: Normocephalic and atraumatic.  Eyes:     Conjunctiva/sclera: Conjunctivae normal.  Cardiovascular:     Rate and Rhythm: Normal rate and regular rhythm.      Heart sounds: No murmur heard. Pulmonary:     Effort: Pulmonary effort is normal. No respiratory distress.     Breath sounds: Normal breath sounds.  Abdominal:     Palpations: Abdomen is soft.     Tenderness: There is abdominal tenderness (Mild suprapubic).  Musculoskeletal:        General: No swelling.     Cervical back: Neck supple.  Skin:    General: Skin is warm and dry.     Capillary Refill: Capillary refill takes less than 2 seconds.  Neurological:     Mental Status: He is alert.  Psychiatric:        Mood and Affect: Mood normal.     ED Results and Treatments Labs (all labs ordered are listed, but only abnormal results are displayed) Labs Reviewed  COMPREHENSIVE METABOLIC PANEL - Abnormal; Notable for the following components:      Result Value   Glucose, Bld 132 (*)    BUN 29 (*)    Creatinine, Ser 1.75 (*)    Calcium 8.4 (*)    Total Protein 6.4 (*)    Albumin 3.4 (*)    GFR, Estimated 39 (*)    All other components within normal limits  CBC WITH DIFFERENTIAL/PLATELET - Abnormal; Notable for the following components:   RBC 3.41 (*)    Hemoglobin 11.2 (*)    HCT 33.1 (*)    Platelets 105 (*)    All other components within normal limits  URINALYSIS, ROUTINE W REFLEX MICROSCOPIC - Abnormal; Notable for the following components:   APPearance CLOUDY (*)    Hgb urine dipstick SMALL (*)    Protein, ur 100 (*)    Nitrite POSITIVE (*)    Leukocytes,Ua LARGE (*)    WBC, UA >50 (*)    Bacteria, UA RARE (*)    Non Squamous Epithelial 0-5 (*)    All other components within normal limits  TROPONIN I (HIGH SENSITIVITY)  TROPONIN I (HIGH SENSITIVITY)                                                                                                                          Radiology No results found.  Pertinent labs & imaging results that were available during my care of the patient were reviewed by me and considered in my medical decision making (see MDM for  details).  Medications Ordered in ED Medications  sulfamethoxazole-trimethoprim (BACTRIM DS) 800-160 MG per tablet 1 tablet (1 tablet Oral Given 06/13/22 1938)  Procedures Procedures  (including critical care time)  Medical Decision Making / ED Course   This patient presents to the ED for concern of weakness, fatigue, this involves an extensive number of treatment options, and is a complaint that carries with it a high risk of complications and morbidity.  The differential diagnosis includes anemia, electrolyte abnormality, dehydration, UTI, surgical complication  MDM: Patient seen in the emergency room for evaluation of generalized weakness and fatigue.  Physical exam with bruising over an appropriately healing incision site over the right sternal notch but no palpable or visible hematoma.  Mild tenderness over the suprapubic region.  Laboratory evaluation with hemoglobin 11.2 which is downtrending over the last 3 days from 13.1 to 12 to 11.2.  BUN is 29, creatinine 1.75 which is baseline for the patient.  Troponin unremarkable.  Urinalysis shows positive nitrites, large leuk esterase, greater than 50 white blood cells and rare bacteria.  Given patient's urinary retention in the hospital his fatigue may be secondary to a urinary tract infection and the patient is allergic to red dye and thus cannot tolerate Keflex.  Patient started on Bactrim and given a liter of fluids.  On reevaluation, patient feels much improved and at no point during his hospital stay today even prior to fluid resuscitation did he have any hypotension.  At discharge, patient asked if we could evaluate the size of his abdominal aneurysm and unfortunately we do not have ultrasound available at this hour of the night and patient has a contrast allergy so a CT scan is not easily available to evaluate  his aneurysm.  Given that the patient does not have significant abdominal pain, hypertension, back pain or other signs of impending ruptured aneurysm, we used shared decision-making and the patient will call his vascular surgeon tomorrow for repeat CBC as this is trending slightly downward and an abdominal ultrasound.  I do not think the patient is actively bleeding and I think his anemia is likely appropriate after vascular surgery procedure, but this should be followed in the outpatient setting.  Patient then discharged with outpatient vascular surgery follow-up.   Additional history obtained: -Additional history obtained from son -External records from outside source obtained and reviewed including: Chart review including previous notes, labs, imaging, consultation notes   Lab Tests: -I ordered, reviewed, and interpreted labs.   The pertinent results include:   Labs Reviewed  COMPREHENSIVE METABOLIC PANEL - Abnormal; Notable for the following components:      Result Value   Glucose, Bld 132 (*)    BUN 29 (*)    Creatinine, Ser 1.75 (*)    Calcium 8.4 (*)    Total Protein 6.4 (*)    Albumin 3.4 (*)    GFR, Estimated 39 (*)    All other components within normal limits  CBC WITH DIFFERENTIAL/PLATELET - Abnormal; Notable for the following components:   RBC 3.41 (*)    Hemoglobin 11.2 (*)    HCT 33.1 (*)    Platelets 105 (*)    All other components within normal limits  URINALYSIS, ROUTINE W REFLEX MICROSCOPIC - Abnormal; Notable for the following components:   APPearance CLOUDY (*)    Hgb urine dipstick SMALL (*)    Protein, ur 100 (*)    Nitrite POSITIVE (*)    Leukocytes,Ua LARGE (*)    WBC, UA >50 (*)    Bacteria, UA RARE (*)    Non Squamous Epithelial 0-5 (*)    All other components within normal limits  TROPONIN I (HIGH SENSITIVITY)  TROPONIN I (HIGH SENSITIVITY)      EKG   EKG Interpretation  Date/Time:  Monday June 13 2022 17:45:28 EDT Ventricular Rate:  59 PR  Interval:  157 QRS Duration: 118 QT Interval:  442 QTC Calculation: 438 R Axis:   77 Text Interpretation: Sinus rhythm No significant change since last tracing Confirmed by [place] ([id number]) on 06/14/2022 12:07:45 AM          Medicines ordered and prescription drug management: Meds ordered this encounter  Medications   sulfamethoxazole-trimethoprim (BACTRIM DS) 800-160 MG per tablet 1 tablet    -I have reviewed the patients home medicines and have made adjustments as needed  Critical interventions none  Consultations Obtained: I requested consultation with the vascular surgeon Dr. Carlis Abbott,  and discussed lab and imaging findings as well as pertinent plan - they recommend: No need for imaging of the area in the setting of no large palpable hematoma, safe for outpatient follow-up   Cardiac Monitoring: The patient was maintained on a cardiac monitor.  I personally viewed and interpreted the cardiac monitored which showed an underlying rhythm of: NSR  Social Determinants of Health:  Factors impacting patients care include: none   Reevaluation: After the interventions noted above, I reevaluated the patient and found that they have :improved  Co morbidities that complicate the patient evaluation  Past Medical History:  Diagnosis Date   AAA (abdominal aortic aneurysm) ([place])    Arthritis    CAD (coronary artery disease)    a. s/p CABG in 1997 b. stent to SVG-PDA and PTCA of LAD in 2003 c. cath in 2015 showing patent SVG-OM1-OM2, SVG-RI, SVG-D1, and LIMA-LAD with occluded SVG-PDA and severe stenosis of mid-LAD at LIMA insertion with DES placed d. 04/2018: similar results to 2015 with patent LAD stent; occlusion of small caliber D1 stenosis with medical management recommended.    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   Myocardial infarction [facility]) 1997   stents x2 (2003/2007)      Dispostion: I considered admission for this patient, and patient currently does not meet inpatient criteria for admission.  He is safe for discharge with outpatient vascular surgery follow-up and was discharged on Bactrim     Final Clinical Impression(s) / ED Diagnoses Final diagnoses:  None     '@PCDICTATION'$ @    Rhyatt Muska, Debe Coder, MD 06/14/22 0011

## 2022-06-13 NOTE — ED Triage Notes (Signed)
Pt to ED from home c/o generalized weakness. States he was just discharged from [facility] for carotid surgery, and is concerned because he still feels weak.

## 2022-06-14 ENCOUNTER — Telehealth: Payer: Self-pay

## 2022-06-14 NOTE — ED Provider Notes (Incomplete)
[facility] Provider Note  CSN: [id number] Arrival date & time: 06/13/22 1727  Chief Complaint(s) Weakness  HPI Peter [name] is a 81 y.o. male with PMH AAA, CAD status post CABG, CHF, CKD, COPD, postop day 3 from a right transcarotid artery revascularization procedure who presents emergency department for evaluation of generalized weakness and hypotension.  Patient states that he has felt a little weaker since his operation and his family member is a Presenter, broadcasting who took his blood pressure and found it to be slightly low in the high 90s.  Patient states that he has some mild lower crampy abdominal pain but denies chest pain, shortness of breath, headache, fever, nausea, vomiting or other systemic symptoms.  He states that while in the hospital he did have some issues with urinary retention but currently is able to urinate without difficulty.   Past Medical History Past Medical History:  Diagnosis Date  . AAA (abdominal aortic aneurysm) ([place])   . Arthritis   . CAD (coronary artery disease)    a. s/p CABG in 1997 b. stent to SVG-PDA and PTCA of LAD in 2003 c. cath in 2015 showing patent SVG-OM1-OM2, SVG-RI, SVG-D1, and LIMA-LAD with occluded SVG-PDA and severe stenosis of mid-LAD at LIMA insertion with DES placed d. 04/2018: similar results to 2015 with patent LAD stent; occlusion of small caliber D1 stenosis with medical management recommended.   . Carotid artery disease ([place])   . Chronic back pain 07/25/2018   Chronic lumbar pain uses hydrocodone intermittently  . Chronic systolic CHF (congestive heart failure) ([place])   . CKD (chronic kidney disease)   . COPD (chronic obstructive pulmonary disease) ([place])   . CVA (cerebrovascular accident) ([place])    04/12/18; 02/11/22  . Diabetes mellitus    diet controlled  . Dyslipidemia   . Hypertension   . 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  .  Myocardial infarction [facility]) 1997   stents x2 (2003/2007)   Patient Active Problem List   Diagnosis Date Noted  . Carotid stenosis 03/25/2022  . Symptomatic carotid artery stenosis, left 03/25/2022  . Chronic systolic heart failure ([place]) 02/23/2022  . Acute CVA (cerebrovascular accident) ([place]) 02/11/2022  . Chronic idiopathic thrombocytopenia ([place]) 02/11/2022  . Chronic kidney disease, stage 3a ([place]) 02/11/2022  . Seborrheic keratoses 06/26/2020  . Chest pain 02/24/2020  . NSTEMI (non-ST elevated myocardial infarction) ([place]) 02/24/2020  . Chronic back pain 07/25/2018  . CVA (cerebral vascular accident) ([place]) 04/14/2018  . Stroke-like symptoms 04/12/2018  . CAD (coronary artery disease) 04/12/2018  . Elevated troponin 04/12/2018  . Claudication ([place]) 04/12/2018  . 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  PCI- LIMA-LAD insertion site DES 08/14/14 08/15/2014  . COPD (chronic obstructive pulmonary disease) ([place]) 08/15/2014  . 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 Plavix allergy 08/15/2014  . HTN (hypertension), benign 02/03/2014  . AAA (abdominal aortic aneurysm) without rupture ([place]) 02/03/2014  . H/O ETOH abuse 04/02/2013  . Bowel habit changes 04/02/2013  . Dyslipidemia 03/13/2013  . Type 2 diabetes mellitus ([place]) 03/13/2013  . Pain, abdominal, nonspecific 03/13/2013   Home Medication(s) Prior to Admission medications   Medication Sig Start Date End Date Taking? Authorizing Provider  acetaminophen (TYLENOL) 500 MG tablet Take 1,000 mg by mouth every 6 (six) hours as needed for mild pain.    [provider]  Aloe Vera Juice LIQD Take 8 oz by mouth daily.  [provider]  aspirin EC 81 MG tablet Take 1 tablet (81 mg total) by mouth daily with breakfast. 02/14/22   Roxan Hockey, MD  Cholecalciferol (VITAMIN D3) 250 MCG (10000 UT) TABS Take 10,000 Units by mouth 3 (three) times a week.    [provider]  diphenhydrAMINE (BENADRYL) 50 MG capsule  Take one tablet by mouth 1 hour prior to procedure 05/19/22   Waynetta Sandy, MD  glucose blood (ACCU-CHEK GUIDE) test strip USE  STRIP TO CHECK GLUCOSE TWICE DAILY DUE  TO  FLUCTUATING  SUGARS 05/10/22   Kathyrn Drown, MD  HYDROcodone-acetaminophen (NORCO) 10-325 MG tablet Take 1 tablet by mouth 2 (two) times daily as needed for moderate pain or severe pain. 05/11/22   [provider]  losartan (COZAAR) 25 MG tablet Take 1 tablet (25 mg total) by mouth daily. 02/14/22   Roxan Hockey, MD  metoprolol succinate (TOPROL-XL) 25 MG 24 hr tablet Take 1 tablet (25 mg total) by mouth daily. 02/14/22   Roxan Hockey, MD  oxyCODONE (OXY IR/ROXICODONE) 5 MG immediate release tablet Take 1 tablet (5 mg total) by mouth every 6 (six) hours as needed for moderate pain. 06/12/22   Ulyses Amor, PA-C  oxyCODONE-acetaminophen (PERCOCET/ROXICET) 5-325 MG tablet Take 1 tablet by mouth every 6 (six) hours as needed for moderate pain. Patient not taking: Reported on 05/31/2022 03/26/22   Karoline Caldwell, PA-C  predniSONE (DELTASONE) 50 MG tablet Take 1 tablet by mouth 13 hours prior to procedure, then one tablet by mouth 7 hours prior to procedure and one tablet by mouth 1 hour prior to procedure 05/19/22   Waynetta Sandy, MD  rosuvastatin (CRESTOR) 40 MG tablet Take 1 tablet (40 mg total) by mouth daily. 02/14/22   Roxan Hockey, MD  tamsulosin (FLOMAX) 0.4 MG CAPS capsule Take 1 capsule (0.4 mg total) by mouth daily. 06/13/22   Ulyses Amor, PA-C  ticagrelor (BRILINTA) 60 MG TABS tablet Take 1 tablet (60 mg total) by mouth 2 (two) times daily. 02/14/22   Roxan Hockey, MD  vitamin E 180 MG (400 UNITS) capsule Take 400 Units by mouth See admin instructions. Twice month    [provider]                                                                                                                                    Past Surgical History Past Surgical History:  Procedure  Laterality Date  . CORONARY ANGIOPLASTY  2003/2007   2 stents  . CORONARY ANGIOPLASTY WITH STENT PLACEMENT  08/14/2014   DES to LIMA-LAD insertion  . CORONARY ARTERY BYPASS GRAFT  12/06/1995   7 vessels  . CORONARY STENT INTERVENTION N/A 02/24/2020   Procedure: CORONARY STENT INTERVENTION;  Surgeon: Sherren Mocha, MD;  Location: [facility] [facility];  Service: Cardiovascular;  Laterality: N/A;  . CORONARY STENT PLACEMENT  08/15/2014   MID LAD  DES  by Dr Burt Knack  . CORONARY/GRAFT ANGIOGRAPHY N/A 02/24/2020   Procedure: CORONARY/GRAFT ANGIOGRAPHY;  Surgeon: Sherren Mocha, MD;  Location: [place] [facility];  Service: Cardiovascular;  Laterality: N/A;  . CYSTECTOMY  12/06/2003   top of head-[place]  . EAR CYST EXCISION  07/09/2012   Procedure: CYST REMOVAL;  Surgeon: Jamesetta So, MD;  Location: [facility];  Service: General;  Laterality: N/A;  . EYE SURGERY Right    cataract removal and then another surgery after  . HERNIA REPAIR     1980's inguinal Left side  . LEFT HEART CATH AND CORS/GRAFTS ANGIOGRAPHY N/A 04/16/2018   Procedure: LEFT HEART CATH AND CORS/GRAFTS ANGIOGRAPHY;  Surgeon: Martinique, Peter M, MD;  Location: [facility];  Service: Cardiovascular;  Laterality: N/A;  . LEFT HEART CATHETERIZATION WITH CORONARY/GRAFT ANGIOGRAM N/A 08/14/2014   Procedure: LEFT HEART CATHETERIZATION WITH Beatrix Fetters;  Surgeon: Blane Ohara, MD;  Location: [facility] CATH LAB;  Service: Cardiovascular;  Laterality: N/A;  . TEE WITHOUT CARDIOVERSION N/A 02/14/2022   Procedure: TRANSESOPHAGEAL ECHOCARDIOGRAM (TEE);  Surgeon: Freada Bergeron, MD;  Location: [facility];  Service: Cardiovascular;  Laterality: N/A;  . TRANSCAROTID ARTERY REVASCULARIZATION  Left 03/25/2022   Procedure: LEFT TRANCAROTID ARTERY REVASCULARIZATION USING 82m X [place]TE [place] Surgeon: CaWaynetta SandyMD;  [facility];  Laterality: Left;  . TRANSCAROTID ARTERY  REVASCULARIZATION  Right 06/10/2022   Procedure: Right Transcarotid Artery Revascularization;  Surgeon: CaWaynetta SandyMD;  [facility]: [facility];  Laterality: Right;  . ULTRASOUND GUIDANCE FOR VASCULAR ACCESS N/A 03/25/2022   Procedure: ULTRASOUND GUIDANCE FOR VASCULAR ACCESS;  Surgeon: CaWaynetta SandyMD;  [facility];  Laterality: N/A;  . ULTRASOUND GUIDANCE FOR VASCULAR ACCESS Left 06/10/2022   Procedure: ULTRASOUND GUIDANCE FOR VASCULAR ACCESS, LEFT FEMORAL VEIN;  Surgeon: CaWaynetta SandyMD;  [facility]: Vascular;  Laterality: Left;   Family History Family History  Problem Relation Age of Onset  . Other Father        deceased after ?TCS or barium enema, age 4719s. Hypertension Father   . Heart attack Mother   . Colon cancer Neg Hx   . Liver disease Neg Hx     Social History Social History   Tobacco Use  . Smoking status: Former    Packs/day: 3.00    Years: 40.00    Total pack years: 120.00    Types: Cigarettes    Quit date: 07/02/2000    Years since quitting: 21.9  . Smokeless tobacco: Never  Vaping Use  . Vaping Use: Never used  Substance Use Topics  . Alcohol use: Not Currently  . Drug use: No   Allergies Dye fdc red [red dye], Ivp dye [iodinated contrast media], Plavix [clopidogrel], and Keflex [cephalexin]  Review of Systems Review of Systems  Gastrointestinal:  Positive for abdominal pain.  Neurological:  Positive for weakness.    Physical Exam Vital Signs  I have reviewed the triage vital signs BP 137/65   Pulse 62   Temp 98.4 F (36.9 C)   Resp 18   Ht 6' (1.829 m)   Wt 79 kg   SpO2 100%   BMI 23.62 kg/m   Physical Exam Vitals and nursing note reviewed.  Constitutional:      General: He is not in acute distress.    Appearance: He is well-developed.  HENT:  Head: Normocephalic and atraumatic.  Eyes:     Conjunctiva/sclera: Conjunctivae normal.   Cardiovascular:     Rate and Rhythm: Normal rate and regular rhythm.     Heart sounds: No murmur heard. Pulmonary:     Effort: Pulmonary effort is normal. No respiratory distress.     Breath sounds: Normal breath sounds.  Abdominal:     Palpations: Abdomen is soft.     Tenderness: There is abdominal tenderness (Mild suprapubic).  Musculoskeletal:        General: No swelling.     Cervical back: Neck supple.  Skin:    General: Skin is warm and dry.     Capillary Refill: Capillary refill takes less than 2 seconds.  Neurological:     Mental Status: He is alert.  Psychiatric:        Mood and Affect: Mood normal.     ED Results and Treatments Labs (all labs ordered are listed, but only abnormal results are displayed) Labs Reviewed  COMPREHENSIVE METABOLIC PANEL - Abnormal; Notable for the following components:      Result Value   Glucose, Bld 132 (*)    BUN 29 (*)    Creatinine, Ser 1.75 (*)    Calcium 8.4 (*)    Total Protein 6.4 (*)    Albumin 3.4 (*)    GFR, Estimated 39 (*)    All other components within normal limits  CBC WITH DIFFERENTIAL/PLATELET - Abnormal; Notable for the following components:   RBC 3.41 (*)    Hemoglobin 11.2 (*)    HCT 33.1 (*)    Platelets 105 (*)    All other components within normal limits  URINALYSIS, ROUTINE W REFLEX MICROSCOPIC - Abnormal; Notable for the following components:   APPearance CLOUDY (*)    Hgb urine dipstick SMALL (*)    Protein, ur 100 (*)    Nitrite POSITIVE (*)    Leukocytes,Ua LARGE (*)    WBC, UA >50 (*)    Bacteria, UA RARE (*)    Non Squamous Epithelial 0-5 (*)    All other components within normal limits  TROPONIN I (HIGH SENSITIVITY)  TROPONIN I (HIGH SENSITIVITY)                                                                                                                          Radiology No results found.  Pertinent labs & imaging results that were available during my care of the patient were reviewed  by me and considered in my medical decision making (see MDM for details).  Medications Ordered in ED Medications  sulfamethoxazole-trimethoprim (BACTRIM DS) 800-160 MG per tablet 1 tablet (1 tablet Oral Given 06/13/22 1938)  Procedures Procedures  (including critical care time)  Medical Decision Making / ED Course   This patient presents to the ED for concern of weakness, fatigue, this involves an extensive number of treatment options, and is a complaint that carries with it a high risk of complications and morbidity.  The differential diagnosis includes anemia, electrolyte abnormality, dehydration, UTI, surgical complication  MDM: ***   Additional history obtained: -Additional history obtained from *** -External records from outside source obtained and reviewed including: Chart review including previous notes, labs, imaging, consultation notes   Lab Tests: -I ordered, reviewed, and interpreted labs.   The pertinent results include:   Labs Reviewed  COMPREHENSIVE METABOLIC PANEL - Abnormal; Notable for the following components:      Result Value   Glucose, Bld 132 (*)    BUN 29 (*)    Creatinine, Ser 1.75 (*)    Calcium 8.4 (*)    Total Protein 6.4 (*)    Albumin 3.4 (*)    GFR, Estimated 39 (*)    All other components within normal limits  CBC WITH DIFFERENTIAL/PLATELET - Abnormal; Notable for the following components:   RBC 3.41 (*)    Hemoglobin 11.2 (*)    HCT 33.1 (*)    Platelets 105 (*)    All other components within normal limits  URINALYSIS, ROUTINE W REFLEX MICROSCOPIC - Abnormal; Notable for the following components:   APPearance CLOUDY (*)    Hgb urine dipstick SMALL (*)    Protein, ur 100 (*)    Nitrite POSITIVE (*)    Leukocytes,Ua LARGE (*)    WBC, UA >50 (*)    Bacteria, UA RARE (*)    Non Squamous Epithelial 0-5 (*)     All other components within normal limits  TROPONIN I (HIGH SENSITIVITY)  TROPONIN I (HIGH SENSITIVITY)      EKG ***  EKG Interpretation  Date/Time:    Ventricular Rate:    PR Interval:    QRS Duration:   QT Interval:    QTC Calculation:   R Axis:     Text Interpretation:           Imaging Studies ordered: I ordered imaging studies including *** I independently visualized and interpreted imaging. I agree with the radiologist interpretation   Medicines ordered and prescription drug management: Meds ordered this encounter  Medications  . sulfamethoxazole-trimethoprim (BACTRIM DS) 800-160 MG per tablet 1 tablet    -I have reviewed the patients home medicines and have made adjustments as needed  Critical interventions ***  Consultations Obtained: I requested consultation with the ***,  and discussed lab and imaging findings as well as pertinent plan - they recommend: ***   Cardiac Monitoring: The patient was maintained on a cardiac monitor.  I personally viewed and interpreted the cardiac monitored which showed an underlying rhythm of: ***  Social Determinants of Health:  Factors impacting patients care include: ***   Reevaluation: After the interventions noted above, I reevaluated the patient and found that they have :{resolved/improved/worsened:23923::"improved"}  Co morbidities that complicate the patient evaluation . Past Medical History:  Diagnosis Date  . AAA (abdominal aortic aneurysm) ([place])   . Arthritis   . CAD (coronary artery disease)    a. s/p CABG in 1997 b. stent to SVG-PDA and PTCA of LAD in 2003 c. cath in 2015 showing patent SVG-OM1-OM2, SVG-RI, SVG-D1, and LIMA-LAD with occluded SVG-PDA and severe stenosis of mid-LAD at LIMA insertion with DES placed d. 04/2018: similar results  to 2015 with patent LAD stent; occlusion of small caliber D1 stenosis with medical management recommended.   . Carotid artery disease ([place])   . Chronic back pain  07/25/2018   Chronic lumbar pain uses hydrocodone intermittently  . Chronic systolic CHF (congestive heart failure) ([place])   . CKD (chronic kidney disease)   . COPD (chronic obstructive pulmonary disease) ([place])   . CVA (cerebrovascular accident) ([place])    04/12/18; 02/11/22  . Diabetes mellitus    diet controlled  . Dyslipidemia   . Hypertension   . 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  . Myocardial infarction [facility]) 1997   stents x2 (2003/2007)      Dispostion: I considered admission for this patient, ***     Final Clinical Impression(s) / ED Diagnoses Final diagnoses:  None     '@PCDICTATION'$ @

## 2022-06-14 NOTE — Telephone Encounter (Signed)
Pt called stating that he went to the ED yesterday and was diagnosed with a UTI. He said his doctor wanted his WBC's checked again and he called to make an appt.  Reviewed pt's chart, returned pt's call, two identifiers used. Informed pt that his PCP would be who he would call to f/u on labs for the UTI. Someone from this office would call him to schedule his post-op f/u in 1-2 weeks. Confirmed understanding.

## 2022-06-15 ENCOUNTER — Telehealth: Payer: Self-pay

## 2022-06-15 DIAGNOSIS — D500 Iron deficiency anemia secondary to blood loss (chronic): Secondary | ICD-10-CM

## 2022-06-15 DIAGNOSIS — N289 Disorder of kidney and ureter, unspecified: Secondary | ICD-10-CM

## 2022-06-15 NOTE — Telephone Encounter (Signed)
PatientI would recommend go ahead with a CBC, metabolic 7, liver Diagnosis renal insufficiency, anemia due to blood loss And lets fit him in as a same-day appointment either Friday and Monday or Tuesday but he can go ahead and do the blood work ASAP I can see him in a same-day slot for myself thanks

## 2022-06-15 NOTE — Telephone Encounter (Signed)
Caller name:[name]   On DPR? :Yes  Call back number:[phone]  Provider they see: [place]  Reason for call:Pt had surgery last week a [facility] and was released on Sunday he had to go to the ER at [facility] for almost passing out. Pt needs to have blood work ordered to check his white blood count cell per ER Dr pt had a UTI infection at the time. Does Pt need appt or will he just order blood work

## 2022-06-16 NOTE — Telephone Encounter (Signed)
Pt contacted and verbalized understanding. Pt placed on schedule for Tuesday at 3:10 pm with PCP. Lab orders placed

## 2022-06-20 DIAGNOSIS — D500 Iron deficiency anemia secondary to blood loss (chronic): Secondary | ICD-10-CM | POA: Diagnosis not present

## 2022-06-20 DIAGNOSIS — N289 Disorder of kidney and ureter, unspecified: Secondary | ICD-10-CM | POA: Diagnosis not present

## 2022-06-21 ENCOUNTER — Ambulatory Visit (INDEPENDENT_AMBULATORY_CARE_PROVIDER_SITE_OTHER): Payer: Medicare Other | Admitting: Family Medicine

## 2022-06-21 VITALS — BP 90/62 | HR 73 | Temp 97.9°F | Ht 72.0 in | Wt 168.0 lb

## 2022-06-21 DIAGNOSIS — N289 Disorder of kidney and ureter, unspecified: Secondary | ICD-10-CM | POA: Diagnosis not present

## 2022-06-21 DIAGNOSIS — I952 Hypotension due to drugs: Secondary | ICD-10-CM | POA: Diagnosis not present

## 2022-06-21 LAB — CBC WITH DIFFERENTIAL/PLATELET
Basophils Absolute: 0.1 10*3/uL (ref 0.0–0.2)
Basos: 1 %
EOS (ABSOLUTE): 0.4 10*3/uL (ref 0.0–0.4)
Eos: 4 %
Hematocrit: 37.1 % — ABNORMAL LOW (ref 37.5–51.0)
Hemoglobin: 12.8 g/dL — ABNORMAL LOW (ref 13.0–17.7)
Immature Grans (Abs): 0 10*3/uL (ref 0.0–0.1)
Immature Granulocytes: 0 %
Lymphocytes Absolute: 2.7 10*3/uL (ref 0.7–3.1)
Lymphs: 30 %
MCH: 32.4 pg (ref 26.6–33.0)
MCHC: 34.5 g/dL (ref 31.5–35.7)
MCV: 94 fL (ref 79–97)
Monocytes Absolute: 0.7 10*3/uL (ref 0.1–0.9)
Monocytes: 8 %
Neutrophils Absolute: 5.1 10*3/uL (ref 1.4–7.0)
Neutrophils: 57 %
Platelets: 153 10*3/uL (ref 150–450)
RBC: 3.95 x10E6/uL — ABNORMAL LOW (ref 4.14–5.80)
RDW: 12.3 % (ref 11.6–15.4)
WBC: 8.9 10*3/uL (ref 3.4–10.8)

## 2022-06-21 LAB — HEPATIC FUNCTION PANEL
ALT: 18 IU/L (ref 0–44)
AST: 25 IU/L (ref 0–40)
Albumin: 4.3 g/dL (ref 3.8–4.8)
Alkaline Phosphatase: 93 IU/L (ref 44–121)
Bilirubin Total: 0.7 mg/dL (ref 0.0–1.2)
Bilirubin, Direct: 0.27 mg/dL (ref 0.00–0.40)
Total Protein: 7.3 g/dL (ref 6.0–8.5)

## 2022-06-21 LAB — BASIC METABOLIC PANEL
BUN/Creatinine Ratio: 12 (ref 10–24)
BUN: 27 mg/dL (ref 8–27)
CO2: 18 mmol/L — ABNORMAL LOW (ref 20–29)
Calcium: 9.6 mg/dL (ref 8.6–10.2)
Chloride: 100 mmol/L (ref 96–106)
Creatinine, Ser: 2.25 mg/dL — ABNORMAL HIGH (ref 0.76–1.27)
Glucose: 179 mg/dL — ABNORMAL HIGH (ref 70–99)
Potassium: 4.3 mmol/L (ref 3.5–5.2)
Sodium: 136 mmol/L (ref 134–144)
eGFR: 29 mL/min/{1.73_m2} — ABNORMAL LOW (ref >59)

## 2022-06-21 NOTE — Progress Notes (Signed)
   Subjective:    Patient ID: Peter [name], male    DOB: [birth year], 81 y.o.   MRN: [id number]  HPI Right carotid surgery follow up  Completed UTI medications  Low appetite , possible constipation  ER follow-up Hospitalization follow-up We reviewed over his surgery we reviewed over his labs his scans urinalysis   Review of Systems     Objective:   Physical Exam  Lungs clear heart regular pulse normal BP low extremities no edema bruising noted on the chest related to his Brilinta and surgery      Assessment & Plan:   1. Hypotension due to drugs Stop losartan monitor closely follow-up within 2 weeks - Basic metabolic panel Recheck blood pressure in 2 weeks 2. Renal insufficiency Kidney function within the next 2 weeks stop losartan - Basic metabolic panel

## 2022-06-23 ENCOUNTER — Ambulatory Visit: Payer: Medicare Other | Admitting: Family Medicine

## 2022-06-29 ENCOUNTER — Ambulatory Visit: Payer: Self-pay | Admitting: Family Medicine

## 2022-06-30 ENCOUNTER — Ambulatory Visit (INDEPENDENT_AMBULATORY_CARE_PROVIDER_SITE_OTHER): Payer: Medicare Other | Admitting: Physician Assistant

## 2022-06-30 ENCOUNTER — Other Ambulatory Visit: Payer: Self-pay

## 2022-06-30 VITALS — BP 132/73 | HR 65 | Temp 98.0°F | Resp 16 | Ht 72.0 in | Wt 173.0 lb

## 2022-06-30 DIAGNOSIS — I6521 Occlusion and stenosis of right carotid artery: Secondary | ICD-10-CM

## 2022-06-30 DIAGNOSIS — I6529 Occlusion and stenosis of unspecified carotid artery: Secondary | ICD-10-CM

## 2022-07-01 ENCOUNTER — Encounter: Payer: Self-pay | Admitting: Physician Assistant

## 2022-07-01 NOTE — Progress Notes (Signed)
POST OPERATIVE OFFICE NOTE    CC:  F/u for surgery  HPI:  This is a 81 y.o. male who is s/p right-sided TCAR by Dr. Donzetta Matters on 06/10/2022 due to asymptomatic high-grade right ICA stenosis.  He also underwent left sided TCAR by Dr. Donzetta Matters on 03/25/2022.  Since his most recent surgery he denies any neurological events including slurring speech, changes in vision, or one-sided weakness.  He believes his right neck incision is healing well.  He continues to take his aspirin, Plavix, statin regimen daily.  He does not report any trouble in the left common femoral vein catheterization site.  Allergies  Allergen Reactions   Dye Fdc Red [Red Dye] Hives    hives   Ivp Dye [Iodinated Contrast Media] Hives   Plavix [Clopidogrel] Hives    Unsure if dye or Plavix   Keflex [Cephalexin] Other (See Comments)    hoarsness    Current Outpatient Medications  Medication Sig Dispense Refill   acetaminophen (TYLENOL) 500 MG tablet Take 1,000 mg by mouth every 6 (six) hours as needed for mild pain.     Aloe Vera Juice LIQD Take 8 oz by mouth daily.     aspirin EC 81 MG tablet Take 1 tablet (81 mg total) by mouth daily with breakfast. 90 tablet 3   Cholecalciferol (VITAMIN D3) 250 MCG (10000 UT) TABS Take 10,000 Units by mouth 3 (three) times a week.     diphenhydrAMINE (BENADRYL) 50 MG capsule Take one tablet by mouth 1 hour prior to procedure 1 capsule 0   glucose blood (ACCU-CHEK GUIDE) test strip USE  STRIP TO CHECK GLUCOSE TWICE DAILY DUE  TO  FLUCTUATING  SUGARS 100 each 0   HYDROcodone-acetaminophen (NORCO) 10-325 MG tablet Take 1 tablet by mouth 2 (two) times daily as needed for moderate pain or severe pain.     metoprolol succinate (TOPROL-XL) 25 MG 24 hr tablet Take 1 tablet (25 mg total) by mouth daily. 90 tablet 3   rosuvastatin (CRESTOR) 40 MG tablet Take 1 tablet (40 mg total) by mouth daily. 90 tablet 3   tamsulosin (FLOMAX) 0.4 MG CAPS capsule Take 1 capsule (0.4 mg total) by mouth daily. 30 capsule  0   ticagrelor (BRILINTA) 60 MG TABS tablet Take 1 tablet (60 mg total) by mouth 2 (two) times daily. 60 tablet 11   vitamin E 180 MG (400 UNITS) capsule Take 400 Units by mouth See admin instructions. Twice month     oxyCODONE (OXY IR/ROXICODONE) 5 MG immediate release tablet Take 1 tablet (5 mg total) by mouth every 6 (six) hours as needed for moderate pain. (Patient not taking: Reported on 06/30/2022) 10 tablet 0   oxyCODONE-acetaminophen (PERCOCET/ROXICET) 5-325 MG tablet Take 1 tablet by mouth every 6 (six) hours as needed for moderate pain. (Patient not taking: Reported on 06/30/2022) 16 tablet 0   No current facility-administered medications for this visit.     ROS:  See HPI  Physical Exam:  Vitals:   06/30/22 1408  BP: 132/73  Pulse: 65  Resp: 16  Temp: 98 F (36.7 C)  TempSrc: Temporal  SpO2: 97%  Weight: 173 lb (78.5 kg)  Height: 6' (1.829 m)    Incision: Right neck incision well-healed Extremities: Moving all extremities well Neuro: Cranial nerves grossly intact  Assessment/Plan:  This is a 81 y.o. male who is 3 weeks s/p: Right-sided TCAR  -Subjectively, patient has not had any neurological events since surgery. -Right neck incision well-healed. -He will return  in about 1 month for carotid duplex per protocol.  This will be scheduled with Dr. Donzetta Matters as patient and his son would like to further discuss management of his juxtarenal abdominal aortic aneurysm at this time.   Dagoberto Ligas, PA-C [facility] [phone]  Clinic MD:  Scot Dock

## 2022-07-04 DIAGNOSIS — N289 Disorder of kidney and ureter, unspecified: Secondary | ICD-10-CM | POA: Diagnosis not present

## 2022-07-04 DIAGNOSIS — I952 Hypotension due to drugs: Secondary | ICD-10-CM | POA: Diagnosis not present

## 2022-07-05 ENCOUNTER — Encounter: Payer: Self-pay | Admitting: Family Medicine

## 2022-07-05 ENCOUNTER — Ambulatory Visit (INDEPENDENT_AMBULATORY_CARE_PROVIDER_SITE_OTHER): Payer: Medicare Other | Admitting: Family Medicine

## 2022-07-05 VITALS — BP 130/70 | HR 63 | Temp 97.3°F | Wt 173.8 lb

## 2022-07-05 DIAGNOSIS — Z23 Encounter for immunization: Secondary | ICD-10-CM

## 2022-07-05 DIAGNOSIS — I10 Essential (primary) hypertension: Secondary | ICD-10-CM | POA: Diagnosis not present

## 2022-07-05 LAB — BASIC METABOLIC PANEL
BUN/Creatinine Ratio: 14 (ref 10–24)
BUN: 20 mg/dL (ref 8–27)
CO2: 24 mmol/L (ref 20–29)
Calcium: 9.4 mg/dL (ref 8.6–10.2)
Chloride: 103 mmol/L (ref 96–106)
Creatinine, Ser: 1.46 mg/dL — ABNORMAL HIGH (ref 0.76–1.27)
Glucose: 125 mg/dL — ABNORMAL HIGH (ref 70–99)
Potassium: 4.6 mmol/L (ref 3.5–5.2)
Sodium: 139 mmol/L (ref 134–144)
eGFR: 48 mL/min/{1.73_m2} — ABNORMAL LOW (ref >59)

## 2022-07-05 NOTE — Progress Notes (Signed)
   Subjective:    Patient ID: Peter [name], male    DOB: 04-11-1941, 81 y.o.   MRN: [id number]  HPI Pt arrives to follow up on Hypotension. Pt was seen 06/21/22. Losartan stopped. Pt states he doesn't have much energy. Blood pressures have been doing ok.  Patient doing the best he can Relates low energy low appetite Recently went through surgery States he is trying to recover No falls  Review of Systems     Objective:   Physical Exam Lungs clear heart regular pulse normal extremities no edema skin warm dry blood pressure good       Assessment & Plan:  Blood pressure on follow-up much better. No longer low Chronic kidney disease noted but creatinine is much better than where it was Has a standard follow-up for pain management later in August keep this  Pneumococcal 20 today

## 2022-07-08 ENCOUNTER — Other Ambulatory Visit: Payer: Self-pay | Admitting: Family Medicine

## 2022-07-13 ENCOUNTER — Telehealth: Payer: Self-pay

## 2022-07-13 ENCOUNTER — Other Ambulatory Visit: Payer: Self-pay | Admitting: Family Medicine

## 2022-07-13 MED ORDER — HYDROCODONE-ACETAMINOPHEN 10-325 MG PO TABS
ORAL_TABLET | ORAL | 0 refills | Status: DC
Start: 2022-07-13 — End: 2022-08-04

## 2022-07-13 NOTE — Telephone Encounter (Signed)
1 refill was sent in

## 2022-07-13 NOTE — Telephone Encounter (Signed)
Encourage patient to contact the pharmacy for refills or they can request refills through [facility]  (Please schedule appointment if patient has not been seen in over a year)    WHAT [place] TO: [facility] in [place] ([place]) 10-325 MG tablet   NOTES/COMMENTS FROM PATIENT:      [place] office please notify patient: It takes 48-72 hours to process rx refill requests Ask patient to call pharmacy to ensure rx is ready before heading there.

## 2022-07-13 NOTE — Telephone Encounter (Signed)
Patient notified

## 2022-07-20 ENCOUNTER — Ambulatory Visit (INDEPENDENT_AMBULATORY_CARE_PROVIDER_SITE_OTHER): Payer: Medicare Other

## 2022-07-20 DIAGNOSIS — I6529 Occlusion and stenosis of unspecified carotid artery: Secondary | ICD-10-CM | POA: Diagnosis not present

## 2022-07-26 ENCOUNTER — Ambulatory Visit: Payer: Medicare Other | Admitting: Family Medicine

## 2022-07-27 ENCOUNTER — Other Ambulatory Visit: Payer: Self-pay

## 2022-07-27 ENCOUNTER — Ambulatory Visit: Payer: Medicare Other | Admitting: Vascular Surgery

## 2022-07-27 ENCOUNTER — Encounter: Payer: Self-pay | Admitting: Vascular Surgery

## 2022-07-27 VITALS — BP 150/77 | HR 63 | Temp 98.1°F | Resp 20 | Ht 72.0 in | Wt 173.0 lb

## 2022-07-27 DIAGNOSIS — I6523 Occlusion and stenosis of bilateral carotid arteries: Secondary | ICD-10-CM

## 2022-07-27 DIAGNOSIS — I7142 Juxtarenal abdominal aortic aneurysm, without rupture: Secondary | ICD-10-CM

## 2022-07-27 NOTE — Progress Notes (Signed)
Patient ID: Peter [name], male   DOB: 11-05-1941, 81 y.o.   MRN: [id number]  Reason for Consult: Follow-up   Referred by Kathyrn Drown, MD  Subjective:     HPI:  Peter [name] is a 81 y.o. male now status post bilateral transcarotid artery stenting.  He also has a known 5.7 cm abdominal aortic aneurysm.  He has not had any problems since the stenting.  No new back or abdominal pain.  He remains active walks with a cane.  He is a primary caretaker of his wife at home.  He remains on aspirin and Brilinta.  Past Medical History:  Diagnosis Date   AAA (abdominal aortic aneurysm) ([place])    Arthritis    CAD (coronary artery disease)    a. s/p CABG in 1997 b. stent to SVG-PDA and PTCA of LAD in 2003 c. cath in 2015 showing patent SVG-OM1-OM2, SVG-RI, SVG-D1, and LIMA-LAD with occluded SVG-PDA and severe stenosis of mid-LAD at LIMA insertion with DES placed d. 04/2018: similar results to 2015 with patent LAD stent; occlusion of small caliber D1 stenosis with medical management recommended.    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   Myocardial infarction [facility]) 1997   stents x2 (2003/2007)   Family History  Problem Relation Age of Onset   Other Father        deceased after ?TCS or barium enema, age 28s   Hypertension Father    Heart attack Mother    Colon cancer Neg Hx    Liver disease Neg Hx    Past Surgical History:  Procedure Laterality Date   CORONARY ANGIOPLASTY  2003/2007   2 stents   CORONARY ANGIOPLASTY  WITH STENT PLACEMENT  08/14/2014   DES to LIMA-LAD insertion   CORONARY ARTERY BYPASS GRAFT  12/06/1995   7 vessels   CORONARY STENT INTERVENTION N/A 02/24/2020   Procedure: CORONARY STENT INTERVENTION;  Surgeon: Sherren Mocha, MD;  Location: [place] [facility];  Service: Cardiovascular;  Laterality: N/A;   CORONARY STENT PLACEMENT  08/15/2014   MID LAD  DES  by Dr Leslye Peer ANGIOGRAPHY N/A 02/24/2020   Procedure: Remus Blake ANGIOGRAPHY;  Surgeon: Sherren Mocha, MD;  Location: [place] [facility];  Service: Cardiovascular;  Laterality: N/A;   CYSTECTOMY  12/06/2003   top of head-[place]   EAR CYST EXCISION  07/09/2012   Procedure: CYST REMOVAL;  Surgeon: Jamesetta So, MD;  Location: [facility];  Service: General;  Laterality: N/A;   EYE SURGERY Right    cataract removal and then another surgery after   HERNIA REPAIR     1980's inguinal Left side   LEFT HEART CATH AND CORS/GRAFTS ANGIOGRAPHY N/A 04/16/2018   Procedure: LEFT HEART CATH AND CORS/GRAFTS ANGIOGRAPHY;  Surgeon: Martinique, Peter M, MD;  Location: [place] [facility];  Service: Cardiovascular;  Laterality: N/A;   LEFT HEART CATHETERIZATION WITH CORONARY/GRAFT ANGIOGRAM N/A 08/14/2014   Procedure: LEFT HEART CATHETERIZATION WITH CORONARY/GRAFT ANGIOGRAM;  Surgeon: Blane Ohara, MD;  Location: [facility] CATH LAB;  Service: Cardiovascular;  Laterality: N/A;   TEE WITHOUT CARDIOVERSION N/A 02/14/2022   Procedure: TRANSESOPHAGEAL ECHOCARDIOGRAM (TEE);  Surgeon: Freada Bergeron, MD;  Location: [facility];  Service: Cardiovascular;  Laterality: N/A;   TRANSCAROTID ARTERY REVASCULARIZATION  Left 03/25/2022   Procedure: LEFT TRANCAROTID ARTERY REVASCULARIZATION USING 2m X 430mENROUTE STENT SYSTEM;  Surgeon: CaWaynetta SandyMD;  [place];  Laterality: Left;   TRANSCAROTID ARTERY REVASCULARIZATION  Right 06/10/2022   Procedure: Right Transcarotid Artery Revascularization;  Surgeon: CaWaynetta SandyMD;  [place]: [facility];  Laterality: Right;   ULTRASOUND GUIDANCE FOR VASCULAR ACCESS N/A 03/25/2022   Procedure: ULTRASOUND GUIDANCE FOR VASCULAR ACCESS;  Surgeon: CaWaynetta SandyMD;  [place] [place];  Laterality: N/A;   ULTRASOUND GUIDANCE FOR VASCULAR ACCESS Left 06/10/2022   Procedure: ULTRASOUND GUIDANCE FOR VASCULAR ACCESS, LEFT FEMORAL VEIN;  Surgeon: CaWaynetta SandyMD;  [place]: [facility];  Laterality: Left;    Short Social History:  Social History   Tobacco Use   Smoking status: Former    Packs/day: 3.00    Years: 40.00    Total pack years: 120.00    Types: Cigarettes    Quit date: 07/02/2000    Years since quitting: 22.0   Smokeless tobacco: Never  Substance Use Topics   Alcohol use: Not Currently    Allergies  Allergen Reactions   Dye Fdc Red [Red Dye] Hives    hives   Ivp Dye [Iodinated Contrast Media] Hives   Plavix [Clopidogrel] Hives    Unsure if dye or Plavix   Keflex [Cephalexin] Other (See Comments)    hoarsness    Current Outpatient Medications  Medication Sig Dispense Refill   acetaminophen (TYLENOL) 500 MG tablet Take 1,000 mg by mouth every 6 (six) hours as needed for mild pain.     Aloe Vera Juice LIQD Take 8 oz by mouth daily.     aspirin EC 81 MG tablet Take 1 tablet (81 mg total) by mouth daily with breakfast. 90 tablet 3   Cholecalciferol (VITAMIN D3) 250 MCG (10000 UT) TABS Take 10,000 Units by mouth 3 (three) times a week.     glucose blood (ACCU-CHEK GUIDE) test strip USE 1 STRIP TO CHECK GLUCOSE TWICE DAILY DUE  TO  FLUCTUATING  SUGARS 100 each 0   HYDROcodone-acetaminophen (NORCO) 10-325 MG tablet 1 twice daily as needed for severe pain caution drowsiness 60 tablet 0   metoprolol succinate (TOPROL-XL) 25 MG 24 hr tablet Take 1 tablet (25 mg total) by mouth daily. 90 tablet 3   oxyCODONE (OXY IR/ROXICODONE) 5 MG immediate release tablet Take 1  tablet (5 mg total) by mouth every 6 (six) hours as needed for moderate pain. 10 tablet 0   rosuvastatin (CRESTOR) 40 MG tablet Take 1 tablet (40 mg total) by mouth daily. 90 tablet 3   tamsulosin (FLOMAX) 0.4 MG CAPS capsule Take 1 capsule (0.4 mg total) by mouth daily. 30 capsule 0   ticagrelor (BRILINTA) 60 MG TABS tablet Take 1 tablet (60 mg total) by mouth 2 (two) times daily. 60 tablet 11   vitamin E 180 MG (400 UNITS) capsule Take 400 Units by mouth See admin instructions. Twice month     No current facility-administered medications for this visit.    Review of Systems  Constitutional: Positive for fatigue.  HENT: HENT negative.  Eyes: Eyes negative.  Respiratory: Respiratory negative.  Cardiovascular: Cardiovascular negative.  GI: Gastrointestinal negative.  Musculoskeletal: Musculoskeletal negative.  Skin: Skin negative.  Neurological: Neurological negative. Hematologic: Positive for bruises/bleeds easily.  Psychiatric: Psychiatric negative.        Objective:  Objective   Vitals:   07/27/22 1418 07/27/22 1420  BP: (!) 160/79 (!) 150/77  Pulse: 63   Resp: 20   Temp: 98.1 F (36.7 C)   SpO2: 96%   Weight: 173 lb (78.5 kg)   Height: 6' (1.829 m)    Body mass index is 23.46 kg/m.  Physical Exam HENT:     Head: Normocephalic.     Nose: Nose normal.  Eyes:     Pupils: Pupils are equal, round, and reactive to light.  Neck:     Vascular: No carotid bruit.     Comments: Well-healed neck incisions bilaterally Cardiovascular:     Pulses: Normal pulses.  Pulmonary:     Effort: Pulmonary effort is normal.  Abdominal:     General: Abdomen is flat.     Palpations: Abdomen is soft. There is no mass.  Musculoskeletal:        General: Normal range of motion.  Skin:    General: Skin is warm and dry.     Capillary Refill: Capillary refill takes less than 2 seconds.  Neurological:     General: No focal deficit present.     Mental Status: He is alert.  Psychiatric:         Mood and Affect: Mood normal.     Data: Right Carotid Findings:  +----------+--------+--------+--------+------------------+-------------+            PSV cm/sEDV cm/sStenosisPlaque DescriptionComments       +----------+--------+--------+--------+------------------+-------------+  CCA Prox  69      11                                               +----------+--------+--------+--------+------------------+-------------+  CCA Mid   79      16                                               +----------+--------+--------+--------+------------------+-------------+  CCA Distal87      12              heterogenous      stent inflow   +----------+--------+--------+--------+------------------+-------------+  ICA Prox                                            stent          +----------+--------+--------+--------+------------------+-------------+  ICA Mid   66      24                                stent outflow  +----------+--------+--------+--------+------------------+-------------+  ICA Distal54      15                                               +----------+--------+--------+--------+------------------+-------------+  ECA       98      0                                                +----------+--------+--------+--------+------------------+-------------+   +----------+--------+-------+----------------+-------------------+            PSV cm/sEDV cmsDescribe        Arm Pressure (mmHG)  +----------+--------+-------+----------------+-------------------+  [id number]            Multiphasic, WNL                     +----------+--------+-------+----------------+-------------------+   +---------+--------+--+--------+--+---------+  VertebralPSV cm/s42EDV cm/s10Antegrade  +---------+--------+--+--------+--+---------+   Right Stent(s):  +---------------------+--------+--------+--------+--------+--------+  distal CCA  to mid ICAPSV cm/sEDV cm/sStenosisWaveformComments  +---------------------+--------+--------+--------+--------+--------+  Prox to Stent        87      12                                +---------------------+--------+--------+--------+--------+--------+  Proximal Stent       39      9                                 +---------------------+--------+--------+--------+--------+--------+  Mid Stent            47      13                                +---------------------+--------+--------+--------+--------+--------+  Distal Stent         44      14                                +---------------------+--------+--------+--------+--------+--------+  Distal to Stent      66      24                                +---------------------+--------+--------+--------+--------+--------+            Left Carotid Findings:  +----------+--------+--------+--------+------------------+-------------+            PSV cm/sEDV cm/sStenosisPlaque DescriptionComments       +----------+--------+--------+--------+------------------+-------------+  CCA Prox  110     14                                               +----------+--------+--------+--------+------------------+-------------+  CCA Mid   86      17                                               +----------+--------+--------+--------+------------------+-------------+  CCA Distal94      13              heterogenous      stent inflow   +----------+--------+--------+--------+------------------+-------------+  ICA Prox  stent          +----------+--------+--------+--------+------------------+-------------+  ICA Mid   66      20                                stent outflow  +----------+--------+--------+--------+------------------+-------------+  ICA Distal53      15                                                +----------+--------+--------+--------+------------------+-------------+  ECA       104     0                                                +----------+--------+--------+--------+------------------+-------------+    +---------+--------+--+--------+-+---------+  VertebralPSV cm/s23EDV cm/s3Antegrade  +---------+--------+--+--------+-+---------+   Left Stent(s):  +---------------------+--------+--------+--------+--------+--------+  distal CCA to mid ICAPSV cm/sEDV cm/sStenosisWaveformComments  +---------------------+--------+--------+--------+--------+--------+  Prox to Stent        64      13                                +---------------------+--------+--------+--------+--------+--------+  Proximal Stent       35      7                                 +---------------------+--------+--------+--------+--------+--------+  Mid Stent            47      14                                +---------------------+--------+--------+--------+--------+--------+  Distal Stent         58      20                                +---------------------+--------+--------+--------+--------+--------+  Distal to Stent      66      20                                +---------------------+--------+--------+--------+--------+--------+      Summary:  Right Carotid: Patent stent with no stenosis.   Left Carotid: Patent stent with no stenosis.   Vertebrals:  Bilateral vertebral arteries demonstrate antegrade flow.  Subclavians: Normal flow hemodynamics were seen in the right subclavian  artery.      Assessment/Plan:    81 year old male status post bilateral TCAR for asymptomatic disease now recovering well.  He does have a large abdominal aortic aneurysm we will repeat CT scan in 2 to 4 weeks and set up a phone visit to discuss the results.  I discussed with him possible referral to [facility] for any endovascular intervention.  He may be able to offer as  I think he would most certainly need open intervention.  I discussed the signs and symptoms of rupture and he demonstrates good understanding.  From a carotid standpoint he  will follow-up in 9 months with repeat carotid duplex.     Waynetta Sandy MD [facility]

## 2022-07-27 NOTE — Telephone Encounter (Signed)
I spoke with Jonelle at [facility] in [place] [phone]  I provided a verbal order for the following medications:  Prednisone 50 mg Qnty: 3 SIG: Take 1 tab po 13 hrs prior to procedure Take 1 tab po 7 hours prior to procedure Take 1 tab by mouth 1 hour prior to procedure  Benadryl '50mg'$  Qnty: 1 Sig: Take 1 tab by mouth 1 hour prior to procedure  Provider: [name]

## 2022-07-29 ENCOUNTER — Other Ambulatory Visit: Payer: Self-pay

## 2022-08-02 ENCOUNTER — Other Ambulatory Visit: Payer: Self-pay

## 2022-08-02 DIAGNOSIS — I6523 Occlusion and stenosis of bilateral carotid arteries: Secondary | ICD-10-CM

## 2022-08-04 ENCOUNTER — Ambulatory Visit (INDEPENDENT_AMBULATORY_CARE_PROVIDER_SITE_OTHER): Payer: Medicare Other | Admitting: Family Medicine

## 2022-08-04 ENCOUNTER — Encounter: Payer: Self-pay | Admitting: Family Medicine

## 2022-08-04 VITALS — BP 138/86 | Wt 171.4 lb

## 2022-08-04 DIAGNOSIS — E1159 Type 2 diabetes mellitus with other circulatory complications: Secondary | ICD-10-CM | POA: Diagnosis not present

## 2022-08-04 DIAGNOSIS — I10 Essential (primary) hypertension: Secondary | ICD-10-CM | POA: Diagnosis not present

## 2022-08-04 DIAGNOSIS — I714 Abdominal aortic aneurysm, without rupture, unspecified: Secondary | ICD-10-CM

## 2022-08-04 DIAGNOSIS — M545 Low back pain, unspecified: Secondary | ICD-10-CM

## 2022-08-04 DIAGNOSIS — G8929 Other chronic pain: Secondary | ICD-10-CM

## 2022-08-04 DIAGNOSIS — N1831 Chronic kidney disease, stage 3a: Secondary | ICD-10-CM

## 2022-08-04 MED ORDER — HYDROCODONE-ACETAMINOPHEN 10-325 MG PO TABS: ORAL_TABLET | ORAL | 0 refills | Status: DC

## 2022-08-04 NOTE — Progress Notes (Signed)
Subjective:    Patient ID: Peter [name], male    DOB: 1940/12/08, 81 y.o.   MRN: [id number]  HPI Pt arrives for follow up on blood pressure. Pt states blood pressure has been doing ok. No issues. Not checking much at home.  Sugars have been "fairly normal". Checks sugars 2-3 times per day.  This patient was seen today for chronic pain  The medication list was reviewed and updated.  Location of Pain for which the patient has been treated with regarding narcotics: Lumbar pain  Onset of this pain: Present for years   -Compliance with medication: Good compliance  - Number patient states they take daily: No more than 2/day  -when was the last dose patient took?  Yesterday  The patient was advised the importance of maintaining medication and not using illegal substances with these.  Here for refills and follow up  The patient was educated that we can provide 3 monthly scripts for their medication, it is their responsibility to follow the instructions.  Side effects or complications from medications: Denies side effects  Patient is aware that pain medications are meant to minimize the severity of the pain to allow their pain levels to improve to allow for better function. They are aware of that pain medications cannot totally remove their pain.  Due for UDT ( at least once per year) : Deferred currently  Scale of 1 to 10 ( 1 is least 10 is most) Your pain level without the medicine: 8 Your pain level with medication 3  Scale 1 to 10 ( 1-helps very little, 10 helps very well) How well does your pain medication reduce your pain so you can function better through out the day?  7  Quality of the pain: Throbbing aching persistence  Persistence of the pain: Persistent all the time  Modifying factors: Worse with activity  HTN (hypertension), benign  Abdominal aortic aneurysm (AAA) without rupture, unspecified part ([place])  Type 2 diabetes mellitus with other circulatory  complication, without long-term current use of insulin (HCC)  Chronic kidney disease, stage 3a (HCC)  Chronic bilateral low back pain without sciatica     Review of Systems     Objective:   Physical Exam  General-in no acute distress Eyes-no discharge Lungs-respiratory rate normal, CTA CV-no murmurs,RRR Extremities skin warm dry no edema Neuro grossly normal Behavior normal, alert       Assessment & Plan:  1. HTN (hypertension), benign Pressure decent control mildly elevated today  2. Abdominal aortic aneurysm (AAA) without rupture, unspecified part [facility]) He will follow through with specialist and they are doing a CAT scan in mid September then doing a follow-up discussion possible consultation with [facility] for surgery  3. Type 2 diabetes mellitus with other circulatory complication, without long-term current use of insulin (HCC) A1c previously good patient denies any low sugar spells he tries to eat healthy  4. Chronic kidney disease, stage 3a ([place]) We did counsel regarding a healthy diet avoiding excessive salt  5. Chronic bilateral low back pain without sciatica The patient was seen in followup for chronic pain. A review over at their current pain status was discussed. Drug registry was checked. Prescriptions were given.  Regular follow-up recommended. Discussion was held regarding the importance of compliance with medication as well as pain medication contract.  Patient was informed that medication may cause drowsiness and should not be combined  with other medications/alcohol or street drugs. If the patient feels medication is causing altered alertness  then do not drive or operate dangerous equipment.  Should be noted that the patient appears to be meeting appropriate use of opioids and response.  Evidenced by improved function and decent pain control without significant side effects and no evidence of overt aberrancy issues.  Upon discussion with the patient today they  understand that opioid therapy is optional and they feel that the pain has been refractory to reasonable conservative measures and is significant and affecting quality of life enough to warrant ongoing therapy and wishes to continue opioids.  Refills were provided.

## 2022-08-18 ENCOUNTER — Ambulatory Visit (HOSPITAL_COMMUNITY)
Admission: RE | Admit: 2022-08-18 | Discharge: 2022-08-18 | Disposition: A | Discharge status: Home / Self Care | Payer: Medicare Other | Source: Ambulatory Visit | Attending: Vascular Surgery | Admitting: Vascular Surgery

## 2022-08-18 DIAGNOSIS — I7142 Juxtarenal abdominal aortic aneurysm, without rupture: Secondary | ICD-10-CM | POA: Insufficient documentation

## 2022-08-18 DIAGNOSIS — K5730 Diverticulosis of large intestine without perforation or abscess without bleeding: Secondary | ICD-10-CM | POA: Diagnosis not present

## 2022-08-18 DIAGNOSIS — I714 Abdominal aortic aneurysm, without rupture, unspecified: Secondary | ICD-10-CM | POA: Diagnosis not present

## 2022-08-18 LAB — POCT I-STAT CREATININE: Creatinine, Ser: 1.7 mg/dL — ABNORMAL HIGH (ref 0.61–1.24)

## 2022-08-18 MED ORDER — IOHEXOL 350 MG/ML SOLN
75.0000 mL | Freq: Once | INTRAVENOUS | Status: AC | PRN
Start: 2022-08-18 — End: 2022-08-18
  Administered 2022-08-18: 75 mL via INTRAVENOUS

## 2022-08-23 ENCOUNTER — Ambulatory Visit (INDEPENDENT_AMBULATORY_CARE_PROVIDER_SITE_OTHER): Payer: Medicare Other

## 2022-08-23 VITALS — Ht 72.0 in | Wt 173.0 lb

## 2022-08-23 DIAGNOSIS — Z0000 Encounter for general adult medical examination without abnormal findings: Secondary | ICD-10-CM

## 2022-08-23 NOTE — Patient Instructions (Signed)
Peter [name] , Thank you for taking time to come for your Medicare Wellness Visit. I appreciate your ongoing commitment to your health goals. Please review the following plan we discussed and let me know if I can assist you in the future.   Screening recommendations/referrals: Colonoscopy: aged out Recommended yearly ophthalmology/optometry visit for glaucoma screening and checkup Recommended yearly dental visit for hygiene and checkup  Vaccinations: Influenza vaccine: n/d Pneumococcal vaccine: 04/25/18 Tdap vaccine: 06/25/04, due if have injury Shingles vaccine: Zostavax 11/17/08   Covid-19: n/d  Advanced directives: yes  Conditions/risks identified: none  Next appointment: Follow up in one year for your annual wellness visit. 08/29/23 @ 1pm by phone  Preventive Care 65 Years and Older, Male Preventive care refers to lifestyle choices and visits with your health care provider that can promote health and wellness. What does preventive care include? A yearly physical exam. This is also called an annual well check. Dental exams once or twice a year. Routine eye exams. Ask your health care provider how often you should have your eyes checked. Personal lifestyle choices, including: Daily care of your teeth and gums. Regular physical activity. Eating a healthy diet. Avoiding tobacco and drug use. Limiting alcohol use. Practicing safe sex. Taking low doses of aspirin every day. Taking vitamin and mineral supplements as recommended by your health care provider. What happens during an annual well check? The services and screenings done by your health care provider during your annual well check will depend on your age, overall health, lifestyle risk factors, and family history of disease. Counseling  Your health care provider may ask you questions about your: Alcohol use. Tobacco use. Drug use. Emotional well-being. Home and relationship well-being. Sexual activity. Eating  habits. History of falls. Memory and ability to understand (cognition). Work and work Statistician. Screening  You may have the following tests or measurements: Height, weight, and BMI. Blood pressure. Lipid and cholesterol levels. These may be checked every 5 years, or more frequently if you are over 65 years old. Skin check. Lung cancer screening. You may have this screening every year starting at age 63 if you have a 30-pack-year history of smoking and currently smoke or have quit within the past 15 years. Fecal occult blood test (FOBT) of the stool. You may have this test every year starting at age 34. Flexible sigmoidoscopy or colonoscopy. You may have a sigmoidoscopy every 5 years or a colonoscopy every 10 years starting at age 32. Prostate cancer screening. Recommendations will vary depending on your family history and other risks. Hepatitis C blood test. Hepatitis B blood test. Sexually transmitted disease (STD) testing. Diabetes screening. This is done by checking your blood sugar (glucose) after you have not eaten for a while (fasting). You may have this done every 1-3 years. Abdominal aortic aneurysm (AAA) screening. You may need this if you are a current or former smoker. Osteoporosis. You may be screened starting at age 61 if you are at high risk. Talk with your health care provider about your test results, treatment options, and if necessary, the need for more tests. Vaccines  Your health care provider may recommend certain vaccines, such as: Influenza vaccine. This is recommended every year. Tetanus, diphtheria, and acellular pertussis (Tdap, Td) vaccine. You may need a Td booster every 10 years. Zoster vaccine. You may need this after age 52. Pneumococcal 13-valent conjugate (PCV13) vaccine. One dose is recommended after age 78. Pneumococcal polysaccharide (PPSV23) vaccine. One dose is recommended after age 25. Talk to  your health care provider about which screenings and  vaccines you need and how often you need them. This information is not intended to replace advice given to you by your health care provider. Make sure you discuss any questions you have with your health care provider. Document Released: 12/18/2015 Document Revised: 08/10/2016 Document Reviewed: 09/22/2015 Elsevier Interactive Patient Education  2017 [place] Prevention in the Home Falls can cause injuries. They can happen to people of all ages. There are many things you can do to make your home safe and to help prevent falls. What can I do on the outside of my home? Regularly fix the edges of walkways and driveways and fix any cracks. Remove anything that might make you trip as you walk through a door, such as a raised step or threshold. Trim any bushes or trees on the path to your home. Use bright outdoor lighting. Clear any walking paths of anything that might make someone trip, such as rocks or tools. Regularly check to see if handrails are loose or broken. Make sure that both sides of any steps have handrails. Any raised decks and porches should have guardrails on the edges. Have any leaves, snow, or ice cleared regularly. Use sand or salt on walking paths during winter. Clean up any spills in your garage right away. This includes oil or grease spills. What can I do in the bathroom? Use night lights. Install grab bars by the toilet and in the tub and shower. Do not use towel bars as grab bars. Use non-skid mats or decals in the tub or shower. If you need to sit down in the shower, use a plastic, non-slip stool. Keep the floor dry. Clean up any water that spills on the floor as soon as it happens. Remove soap buildup in the tub or shower regularly. Attach bath mats securely with double-sided non-slip rug tape. Do not have throw rugs and other things on the floor that can make you trip. What can I do in the bedroom? Use night lights. Make sure that you have a light by your  bed that is easy to reach. Do not use any sheets or blankets that are too big for your bed. They should not hang down onto the floor. Have a firm chair that has side arms. You can use this for support while you get dressed. Do not have throw rugs and other things on the floor that can make you trip. What can I do in the kitchen? Clean up any spills right away. Avoid walking on wet floors. Keep items that you use a lot in easy-to-reach places. If you need to reach something above you, use a strong step stool that has a grab bar. Keep electrical cords out of the way. Do not use floor polish or wax that makes floors slippery. If you must use wax, use non-skid floor wax. Do not have throw rugs and other things on the floor that can make you trip. What can I do with my stairs? Do not leave any items on the stairs. Make sure that there are handrails on both sides of the stairs and use them. Fix handrails that are broken or loose. Make sure that handrails are as long as the stairways. Check any carpeting to make sure that it is firmly attached to the stairs. Fix any carpet that is loose or worn. Avoid having throw rugs at the top or bottom of the stairs. If you do have throw rugs, attach  them to the floor with carpet tape. Make sure that you have a light switch at the top of the stairs and the bottom of the stairs. If you do not have them, ask someone to add them for you. What else can I do to help prevent falls? Wear shoes that: Do not have high heels. Have rubber bottoms. Are comfortable and fit you well. Are closed at the toe. Do not wear sandals. If you use a stepladder: Make sure that it is fully opened. Do not climb a closed stepladder. Make sure that both sides of the stepladder are locked into place. Ask someone to hold it for you, if possible. Clearly mark and make sure that you can see: Any grab bars or handrails. First and last steps. Where the edge of each step is. Use tools that  help you move around (mobility aids) if they are needed. These include: Canes. Walkers. Scooters. Crutches. Turn on the lights when you go into a dark area. Replace any light bulbs as soon as they burn out. Set up your furniture so you have a clear path. Avoid moving your furniture around. If any of your floors are uneven, fix them. If there are any pets around you, be aware of where they are. Review your medicines with your doctor. Some medicines can make you feel dizzy. This can increase your chance of falling. Ask your doctor what other things that you can do to help prevent falls. This information is not intended to replace advice given to you by your health care provider. Make sure you discuss any questions you have with your health care provider. Document Released: 09/17/2009 Document Revised: 04/28/2016 Document Reviewed: 12/26/2014 Elsevier Interactive Patient Education  2017 [employer].

## 2022-08-23 NOTE — Progress Notes (Signed)
Virtual Visit via Telephone Note  I connected with  Peter [name] on 08/23/22 at  1:00 PM EDT by telephone and verified that I am speaking with the correct person using two identifiers.  Location: Patient: home Provider: [facility] Persons participating in the virtual visit: patient/Nurse Health Advisor   I discussed the limitations, risks, security and privacy concerns of performing an evaluation and management service by telephone and the availability of in person appointments. The patient expressed understanding and agreed to proceed.  Interactive audio and video telecommunications were attempted between this nurse and patient, however failed, due to patient having technical difficulties OR patient did not have access to video capability.  We continued and completed visit with audio only.  Some vital signs may be absent or patient reported.   Dionisio David, LPN  Subjective:   Peter [name] is a 81 y.o. male who presents for Medicare Annual/Subsequent preventive examination.  Review of Systems     Cardiac Risk Factors include: advanced age (>80mn, >>45women);diabetes mellitus;dyslipidemia;hypertension;male gender;sedentary lifestyle     Objective:    There were no vitals filed for this visit. There is no height or weight on file to calculate BMI.     08/23/2022    1:04 PM 06/13/2022    5:41 PM 06/03/2022    1:21 PM 03/25/2022   11:40 AM 03/18/2022    1:31 PM 02/14/2022   11:17 AM 02/11/2022   10:00 PM  Advanced Directives  Does Patient Have a Medical Advance Directive? Yes No;Yes Yes Yes No No No;Yes  Type of AParamedicof [place]intonLiving will [place] will [place]Living will [place]Living will     Does patient want to make changes to medical advance directive? No - Patient declined No - Patient declined No - Patient declined No - Patient declined     Copy of HDanain Chart? Yes - validated most recent copy scanned in chart (See row information) Yes - validated most recent copy scanned in chart (See row information) Yes - validated most recent copy scanned in chart (See row information) Yes - validated most recent copy scanned in chart (See row information)     Would patient like information on creating a medical advance directive?  No - Patient declined   Yes (MAU/Ambulatory/Procedural Areas - Information given)  No - Patient declined    Current Medications (verified) Outpatient Encounter Medications as of 08/23/2022  Medication Sig   acetaminophen (TYLENOL) 500 MG tablet Take 1,000 mg by mouth every 6 (six) hours as needed for mild pain.   Aloe Vera Juice LIQD Take 8 oz by mouth daily.   aspirin EC 81 MG tablet Take 1 tablet (81 mg total) by mouth daily with breakfast.   Cholecalciferol (VITAMIN D3) 250 MCG (10000 UT) TABS Take 10,000 Units by mouth 3 (three) times a week.   glucose blood (ACCU-CHEK GUIDE) test strip USE 1 STRIP TO CHECK GLUCOSE TWICE DAILY DUE  TO  FLUCTUATING  SUGARS   HYDROcodone-acetaminophen (NORCO) 10-325 MG tablet 1 twice daily as needed for severe pain caution drowsiness   metoprolol succinate (TOPROL-XL) 25 MG 24 hr tablet Take 1 tablet (25 mg total) by mouth daily.   rosuvastatin (CRESTOR) 40 MG tablet Take 1 tablet (40 mg total) by mouth daily.   ticagrelor (BRILINTA) 60 MG TABS tablet Take 1 tablet (60 mg total) by mouth 2 (two) times daily.   vitamin E  180 MG (400 UNITS) capsule Take 400 Units by mouth See admin instructions. Twice month   predniSONE (DELTASONE) 50 MG tablet TAKE 1 TABLET BY MOUTH 13 HOURS PRIOR TO THE PROCEDURE, TAKE 1 TABLET BY MOUTH 7 HOURS PRIOR TO THE PROCEDURE, THEN TAKE 1 TABLET BY MOUTH 1 HOUR PRIOR TO THE PROCEDURE. ALSO TAKE '50MG'$  OF BENADRYL 1 HOUR PRIOR TO THE PROCEDURE (Patient not taking: Reported on 08/23/2022)   [DISCONTINUED] HYDROcodone-acetaminophen (NORCO) 10-325 MG tablet Take one  tablet po BID prn for severe pain. Caution Drowsiness   [DISCONTINUED] HYDROcodone-acetaminophen (NORCO) 10-325 MG tablet Take one tablet po BID prn severe pain. Caution Drowsiness   No facility-administered encounter medications on file as of 08/23/2022.    Allergies (verified) Dye fdc red [red dye], Ivp dye [iodinated contrast media], Plavix [clopidogrel], and Keflex [cephalexin]   History: Past Medical History:  Diagnosis Date   AAA (abdominal aortic aneurysm) ([place])    Arthritis    CAD (coronary artery disease)    a. s/p CABG in 1997 b. stent to SVG-PDA and PTCA of LAD in 2003 c. cath in 2015 showing patent SVG-OM1-OM2, SVG-RI, SVG-D1, and LIMA-LAD with occluded SVG-PDA and severe stenosis of mid-LAD at LIMA insertion with DES placed d. 04/2018: similar results to 2015 with patent LAD stent; occlusion of small caliber D1 stenosis with medical management recommended.    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   Myocardial infarction [facility]) 1997   stents x2 (2003/2007)   Past Surgical History:  Procedure Laterality Date   CORONARY ANGIOPLASTY  2003/2007   2 stents   CORONARY ANGIOPLASTY WITH STENT PLACEMENT  08/14/2014   DES to LIMA-LAD insertion   CORONARY ARTERY BYPASS GRAFT  12/06/1995   7 vessels   CORONARY STENT INTERVENTION N/A 02/24/2020   Procedure: CORONARY STENT INTERVENTION;  Surgeon: Sherren Mocha, MD;  Location: [place] [facility];  Service: Cardiovascular;  Laterality: N/A;   CORONARY STENT PLACEMENT   08/15/2014   MID LAD  DES  by Dr Leslye Peer ANGIOGRAPHY N/A 02/24/2020   Procedure: Remus Blake ANGIOGRAPHY;  Surgeon: Sherren Mocha, MD;  Location: [facility];  Service: Cardiovascular;  Laterality: N/A;   CYSTECTOMY  12/06/2003   top of head-[place]   EAR CYST EXCISION  07/09/2012   Procedure: CYST REMOVAL;  Surgeon: Jamesetta So, MD;  Location: [facility];  Service: General;  Laterality: N/A;   EYE SURGERY Right    cataract removal and then another surgery after   HERNIA REPAIR     1980's inguinal Left side   LEFT HEART CATH AND CORS/GRAFTS ANGIOGRAPHY N/A 04/16/2018   Procedure: LEFT HEART CATH AND CORS/GRAFTS ANGIOGRAPHY;  Surgeon: Martinique, Peter M, MD;  Location: [place] [facility];  Service: Cardiovascular;  Laterality: N/A;   LEFT HEART CATHETERIZATION WITH CORONARY/GRAFT ANGIOGRAM N/A 08/14/2014   Procedure: LEFT HEART CATHETERIZATION WITH Beatrix Fetters;  Surgeon: Blane Ohara, MD;  Location: [facility] CATH LAB;  Service: Cardiovascular;  Laterality: N/A;  TEE WITHOUT CARDIOVERSION N/A 02/14/2022   Procedure: TRANSESOPHAGEAL ECHOCARDIOGRAM (TEE);  Surgeon: Freada Bergeron, MD;  Location: [facility];  Service: Cardiovascular;  Laterality: N/A;   TRANSCAROTID ARTERY REVASCULARIZATION  Left 03/25/2022   Procedure: LEFT TRANCAROTID ARTERY REVASCULARIZATION USING 55m X 421mENROUTE STENT SYSTEM;  Surgeon: CaWaynetta SandyMD;  [facility];  Laterality: Left;   TRANSCAROTID ARTERY REVASCULARIZATION  Right 06/10/2022   Procedure: Right Transcarotid Artery Revascularization;  Surgeon: CaWaynetta SandyMD;  [facility]: [facility];  Laterality: Right;   ULTRASOUND GUIDANCE FOR VASCULAR ACCESS N/A 03/25/2022   Procedure: ULTRASOUND GUIDANCE FOR VASCULAR ACCESS;  Surgeon: CaWaynetta SandyMD;  [facility]: [facility];  Laterality: N/A;   ULTRASOUND GUIDANCE FOR VASCULAR ACCESS Left  06/10/2022   Procedure: ULTRASOUND GUIDANCE FOR VASCULAR ACCESS, LEFT FEMORAL VEIN;  Surgeon: CaWaynetta SandyMD;  [facility];  Laterality: Left;   Family History  Problem Relation Age of Onset   Other Father        deceased after ?TCS or barium enema, age 6657s Hypertension Father    Heart attack Mother    Colon cancer Neg Hx    Liver disease Neg Hx    Social History   Socioeconomic History   Marital status: Married    Spouse name: GlPeter [name] Number of children: 2   Years of education: Not on file   Highest education level: Not on file  Occupational History   Not on file  Tobacco Use   Smoking status: Former    Packs/day: 3.00    Years: 40.00    Total pack years: 120.00    Types: Cigarettes    Quit date: 07/02/2000    Years since quitting: 22.1   Smokeless tobacco: Never  Vaping Use   Vaping Use: Never used  Substance and Sexual Activity   Alcohol use: Not Currently   Drug use: No   Sexual activity: Not Currently    Birth control/protection: None  Other Topics Concern   Not on file  Social History Narrative   Married x 62 years. Cares for wife at home. Family assists.   Social Determinants of Health   Financial Resource Strain: Low Risk  (08/23/2022)   Overall Financial Resource Strain (CARDIA)    Difficulty of Paying Living Expenses: Not hard at all  Food Insecurity: No Food Insecurity (08/23/2022)   Hunger Vital Sign    Worried About Running Out of Food in the Last Year: Never true    Ran Out of Food in the Last Year: Never true  Transportation Needs: No Transportation Needs (08/23/2022)   PRAPARE - TrHydrologistMedical): No    Lack of Transportation (Non-Medical): No  Physical Activity: Sufficiently Active (08/23/2022)   Exercise Vital Sign    Days of Exercise per Week: 5 days    Minutes of Exercise per Session: 60 min  Stress: Stress Concern Present (08/23/2022)   [place]  Feeling of Stress : To some extent  Social Connections: Moderately Isolated (08/23/2022)   Social Connection and Isolation Panel [NHANES]    Frequency of Communication with Friends and Family: Three times a week    Frequency of Social Gatherings with Friends and Family: Twice a week    Attends Religious Services: Never    AcMarine scientistr Organizations: No  Attends Archivist Meetings: Never    Marital Status: Married    Tobacco Counseling Counseling given: Not Answered   Clinical Intake:  Pre-visit preparation completed: Yes  Pain : No/denies pain     Nutritional Risks: None Diabetes: Yes CBG done?: No Did pt. bring in CBG monitor from home?: No  How often do you need to have someone help you when you read instructions, pamphlets, or other written materials from your doctor or pharmacy?: 1 - Never  Diabetic?yes Nutrition Risk Assessment:  Has the patient had any N/V/D within the last 2 months?  No  Does the patient have any non-healing wounds?  No  Has the patient had any unintentional weight loss or weight gain?  No   Diabetes:  Is the patient diabetic?  Yes  If diabetic, was a CBG obtained today?  No  Did the patient bring in their glucometer from home?  No  How often do you monitor your CBG's? Twice/day.   Financial Strains and Diabetes Management:  Are you having any financial strains with the device, your supplies or your medication? No .  Does the patient want to be seen by Chronic Care Management for management of their diabetes?  No  Would the patient like to be referred to a Nutritionist or for Diabetic Management?  No   Diabetic Exams:  Diabetic Eye Exam: Completed 04/04/18. . Pt has been advised about the importance in completing this exam.  Diabetic Foot Exam: Completed 11/24/21. Pt has been advised about the importance in completing this exam.   Interpreter Needed?: No  Information  entered by :: Kirke Shaggy, LPN   Activities of Daily Living    08/23/2022    1:06 PM 06/03/2022    1:25 PM  In your present state of health, do you have any difficulty performing the following activities:  Hearing? 0   Vision? 0   Difficulty concentrating or making decisions? 0   Walking or climbing stairs? 0   Dressing or bathing? 0   Doing errands, shopping? 0 0  Preparing Food and eating ? N   Using the Toilet? N   In the past six months, have you accidently leaked urine? N   Do you have problems with loss of bowel control? N   Managing your Medications? N   Managing your Finances? N   Housekeeping or managing your Housekeeping? N     Patient Care Team: Kathyrn Drown, MD as PCP - General (Family Medicine) Harl Bowie Alphonse Guild, MD as PCP - Cardiology (Cardiology)  Indicate any recent Medical Services you may have received from other than [facility] providers in the past year (date may be approximate).     Assessment:   This is a routine wellness examination for Peter [name].  Hearing/Vision screen Hearing Screening - Comments:: No aids Vision Screening - Comments:: Wears glasses- Dr.Martin in [place]  Dietary issues and exercise activities discussed: Current Exercise Habits: Home exercise routine, Type of exercise: walking, Time (Minutes): 60, Frequency (Times/Week): 5, Weekly Exercise (Minutes/Week): 300, Intensity: Mild   Goals Addressed             This Visit's Progress    DIET - EAT MORE FRUITS AND VEGETABLES         Depression Screen    08/23/2022    1:02 PM 06/21/2022    3:35 PM 08/17/2021    1:13 PM 05/20/2021    2:32 PM 05/20/2021    2:16 PM 12/03/2020   11:18 AM 06/27/2017  1:54 PM  PHQ 2/9 Scores  PHQ - 2 Score 1 0 0 0 0 0 0  PHQ- 9 Score 3          Fall Risk    08/23/2022    1:06 PM 06/21/2022    3:35 PM 01/17/2022    3:24 PM 08/17/2021    1:26 PM 05/20/2021    2:31 PM  [place] in the past year? 0 0 0 0 0  Number falls in past yr: 0 0 0 0 0   Injury with Fall? 0 0 0 0 0  Risk for fall due to : No Fall Risks No Fall Risks No Fall Risks Impaired vision;Medication side effect No Fall Risks  Follow up Falls prevention discussed;Falls evaluation completed Falls evaluation completed Falls evaluation completed Falls prevention discussed Falls evaluation completed    FALL RISK PREVENTION PERTAINING TO THE HOME:  Any stairs in or around the home? No  If so, are there any without handrails? No  Home free of loose throw rugs in walkways, pet beds, electrical cords, etc? Yes  Adequate lighting in your home to reduce risk of falls? Yes   ASSISTIVE DEVICES UTILIZED TO PREVENT FALLS:  Life alert? No  Use of a cane, walker or w/c? Yes  Grab bars in the bathroom? Yes  Shower chair or bench in shower? Yes  Elevated toilet seat or a handicapped toilet? Yes    Cognitive Function:        08/23/2022    1:08 PM 08/17/2021    1:29 PM  6CIT Screen  What Year? 0 points 0 points  What month? 0 points 0 points  What time? 0 points 0 points  Count back from 20 0 points 0 points  Months in reverse 0 points 0 points  Repeat phrase 0 points 0 points  Total Score 0 points 0 points    Immunizations Immunization History  Administered Date(s) Administered   PNEUMOCOCCAL CONJUGATE-20 07/05/2022   Pneumococcal Conjugate-13 04/25/2018   Td 06/25/2004   Zoster, Live 11/17/2008    TDAP status: Due, Education has been provided regarding the importance of this vaccine. Advised may receive this vaccine at local pharmacy or Health Dept. Aware to provide a copy of the vaccination record if obtained from local pharmacy or Health Dept. Verbalized acceptance and understanding.  Flu Vaccine status: Due, Education has been provided regarding the importance of this vaccine. Advised may receive this vaccine at local pharmacy or Health Dept. Aware to provide a copy of the vaccination record if obtained from local pharmacy or Health Dept. Verbalized acceptance  and understanding.  Pneumococcal vaccine status: Due, Education has been provided regarding the importance of this vaccine. Advised may receive this vaccine at local pharmacy or Health Dept. Aware to provide a copy of the vaccination record if obtained from local pharmacy or Health Dept. Verbalized acceptance and understanding.  Covid-19 vaccine status: Completed vaccines  Qualifies for Shingles Vaccine? Yes   Zostavax completed Yes   Shingrix Completed?: No.    Education has been provided regarding the importance of this vaccine. Patient has been advised to call insurance company to determine out of pocket expense if they have not yet received this vaccine. Advised may also receive vaccine at local pharmacy or Health Dept. Verbalized acceptance and understanding.  Screening Tests Health Maintenance  Topic Date Due   Zoster Vaccines- Shingrix (1 of 2) Never done   TETANUS/TDAP  06/25/2014   OPHTHALMOLOGY EXAM  04/05/2019  Diabetic kidney evaluation - Urine ACR  09/04/2020   FOOT EXAM  11/24/2022   HEMOGLOBIN A1C  12/03/2022   Diabetic kidney evaluation - GFR measurement  07/05/2023   Pneumonia Vaccine 90+ Years old  Completed   HPV VACCINES  Aged Out   INFLUENZA VACCINE  Discontinued   COVID-19 Vaccine  Discontinued    Health Maintenance  Health Maintenance Due  Topic Date Due   Zoster Vaccines- Shingrix (1 of 2) Never done   TETANUS/TDAP  06/25/2014   OPHTHALMOLOGY EXAM  04/05/2019   Diabetic kidney evaluation - Urine ACR  09/04/2020    Colorectal cancer screening: No longer required.   Lung Cancer Screening: (Low Dose CT Chest recommended if Age 8-80 years, 30 pack-year currently smoking OR have quit w/in 15years.) does not qualify.    Additional Screening:  Hepatitis C Screening: does not qualify; Completed no  Vision Screening: Recommended annual ophthalmology exams for early detection of glaucoma and other disorders of the eye. Is the patient up to date with their  annual eye exam?  Yes  Who is the provider or what is the name of the office in which the patient attends annual eye exams? Dr.Martin If pt is not established with a provider, would they like to be referred to a provider to establish care? No .   Dental Screening: Recommended annual dental exams for proper oral hygiene  Community Resource Referral / Chronic Care Management: CRR required this visit?  No   CCM required this visit?  No      Plan:     I have personally reviewed and noted the following in the patient's chart:   Medical and social history Use of alcohol, tobacco or illicit drugs  Current medications and supplements including opioid prescriptions. Patient is currently taking opioid prescriptions. Information provided to patient regarding non-opioid alternatives. Patient advised to discuss non-opioid treatment plan with their provider. Functional ability and status Nutritional status Physical activity Advanced directives List of other physicians Hospitalizations, surgeries, and ER visits in previous 12 months Vitals Screenings to include cognitive, depression, and falls Referrals and appointments  In addition, I have reviewed and discussed with patient certain preventive protocols, quality metrics, and best practice recommendations. A written personalized care plan for preventive services as well as general preventive health recommendations were provided to patient.     Dionisio David, LPN   0/80/2233   Nurse Notes: none- stressed about his health and taking care of his wife

## 2022-08-24 ENCOUNTER — Ambulatory Visit (INDEPENDENT_AMBULATORY_CARE_PROVIDER_SITE_OTHER): Payer: Medicare Other | Admitting: Vascular Surgery

## 2022-08-24 DIAGNOSIS — I7141 Pararenal abdominal aortic aneurysm, without rupture: Secondary | ICD-10-CM

## 2022-08-24 NOTE — Progress Notes (Signed)
Virtual Visit via Telephone Note    I connected with Peter [name] on 08/24/2022 using the [employer] by telephone and verified that I was speaking with the correct person using two identifiers. Patient was located at at home alone and I am in my office.   The limitations of evaluation and management by telemedicine and the availability of in person appointments have been previously discussed with the patient and are documented in the patients chart. The patient expressed understanding and consented to proceed.  PCP: Kathyrn Drown, MD  Chief Complaint: Abdominal aortic aneurysm  History of Present Illness: Peter [name] is a 81 y.o. male with with history of bilateral transcarotid artery stenting on aspirin and Brilinta.  He follows up via telephone to discuss results of his recent CT scan evaluate his abdominal aortic aneurysm.  He has no new back or abdominal pain.  He continues to be the primary caregiver for his wife.  Past Medical History:  Diagnosis Date   AAA (abdominal aortic aneurysm) ([place])    Arthritis    CAD (coronary artery disease)    a. s/p CABG in 1997 b. stent to SVG-PDA and PTCA of LAD in 2003 c. cath in 2015 showing patent SVG-OM1-OM2, SVG-RI, SVG-D1, and LIMA-LAD with occluded SVG-PDA and severe stenosis of mid-LAD at LIMA insertion with DES placed d. 04/2018: similar results to 2015 with patent LAD stent; occlusion of small caliber D1 stenosis with medical management recommended.    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   Myocardial infarction [facility]) 1997   stents x2 (2003/2007)    Past Surgical History:  Procedure Laterality Date   CORONARY ANGIOPLASTY  2003/2007   2 stents   CORONARY ANGIOPLASTY WITH STENT PLACEMENT  08/14/2014   DES to LIMA-LAD insertion   CORONARY ARTERY BYPASS GRAFT  12/06/1995   7 vessels   CORONARY STENT INTERVENTION N/A 02/24/2020   Procedure: CORONARY STENT INTERVENTION;  Surgeon: Sherren Mocha, MD;  Location: [place] [facility];  Service: Cardiovascular;  Laterality: N/A;   CORONARY STENT PLACEMENT  08/15/2014   MID LAD  DES  by Dr Leslye Peer ANGIOGRAPHY N/A 02/24/2020   Procedure: Remus Blake ANGIOGRAPHY;  Surgeon: Sherren Mocha, MD;  Location: [place] [facility];  Service: Cardiovascular;  Laterality: N/A;   CYSTECTOMY  12/06/2003   top of head-Jenkins   EAR CYST EXCISION  07/09/2012   Procedure: CYST REMOVAL;  Surgeon: Jamesetta So, MD;  Location: [facility];  Service: General;  Laterality: N/A;   EYE SURGERY Right    cataract removal and then another surgery after   HERNIA REPAIR     1980's inguinal Left side   LEFT HEART CATH AND CORS/GRAFTS ANGIOGRAPHY N/A 04/16/2018   Procedure: LEFT HEART CATH AND CORS/GRAFTS ANGIOGRAPHY;  Surgeon: Martinique, Peter M, MD;  Location: [place] [facility];  Service: Cardiovascular;  Laterality: N/A;   LEFT HEART CATHETERIZATION WITH CORONARY/GRAFT ANGIOGRAM N/A 08/14/2014   Procedure:  LEFT HEART CATHETERIZATION WITH Beatrix Fetters;  Surgeon: Blane Ohara, MD;  Location: [facility] CATH LAB;  Service: Cardiovascular;  Laterality: N/A;   TEE WITHOUT CARDIOVERSION N/A 02/14/2022   Procedure: TRANSESOPHAGEAL ECHOCARDIOGRAM (TEE);  Surgeon: Freada Bergeron, MD;  Location: [facility];  Service: Cardiovascular;  Laterality: N/A;   TRANSCAROTID ARTERY REVASCULARIZATION  Left 03/25/2022   Procedure: LEFT TRANCAROTID ARTERY REVASCULARIZATION USING 62m X 461mENROUTE STENT SYSTEM;  Surgeon:  CaWaynetta SandyMD;  [place] [facility] [place];  Laterality: Left;   TRANSCAROTID ARTERY REVASCULARIZATION  Right 06/10/2022   Procedure: Right Transcarotid Artery Revascularization;  Surgeon: CaWaynetta SandyMD;  [place] [facility] Service: Vascular;  Laterality: Right;   ULTRASOUND GUIDANCE FOR VASCULAR ACCESS N/A 03/25/2022   Procedure: ULTRASOUND GUIDANCE FOR VASCULAR ACCESS;  Surgeon: CaWaynetta SandyMD;  [place] [facility]: Vascular;  Laterality: N/A;   ULTRASOUND GUIDANCE FOR VASCULAR ACCESS Left 06/10/2022   Procedure: ULTRASOUND GUIDANCE FOR VASCULAR ACCESS, LEFT FEMORAL VEIN;  Surgeon: CaWaynetta SandyMD;  [place] Service: Vascular;  Laterality: Left;    No outpatient medications have been marked as taking for the 08/24/22 encounter (Appointment) with CaWaynetta SandyMD.    12 system ROS was negative unless otherwise noted in HPI   Observations/Objective: No limitations to understanding   CTA IMPRESSION: VASCULAR   1. Continued interval enlargement of juxtarenal abdominal aortic aneurysm measuring 5.8 x 5.7 cm on today's exam, previously 5.5 x 5.4 cm. Recommend referral to a vascular specialist. This recommendation follows ACR consensus guidelines: White Paper of the ACR Incidental Findings Committee II on Vascular Findings. J Am Coll Radiol 2013; 10:789-794. 2. 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 3. 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 4. Diffuse atherosclerotic disease of the abdominal aorta and its branch vessels as described.    Assessment and Plan: [age]ear old male with history of bilateral transcarotid artery stenting on Brilinta and aspirin.  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  [facility] FaSammuel Hinesr PaWilhelmenia Blaseor consideration of four-vessel fenestrated device or open aneurysm repair at their discretion.  [facility] but I have assured him that this is his best option given his type of aneurysm and current risk of rupture exceeding 10%.  Patient demonstrates good understanding we will make the referral today.  He will keep his regularly scheduled follow-up here in 9 months with carotid duplex.    I spent 30 minutes with the patient via telephone encounter and in record review and CT evaluation prior to discussion with the patient.   Signed, BrServando Snareascular and Vein Specialists [facility] [place]: [id number]/20/2023, 4:01 PM

## 2022-08-25 ENCOUNTER — Other Ambulatory Visit: Payer: Self-pay

## 2022-08-25 DIAGNOSIS — I6523 Occlusion and stenosis of bilateral carotid arteries: Secondary | ICD-10-CM

## 2022-09-07 ENCOUNTER — Other Ambulatory Visit: Payer: Self-pay | Admitting: Family Medicine

## 2022-10-19 ENCOUNTER — Other Ambulatory Visit: Payer: Self-pay | Admitting: Family Medicine

## 2022-11-04 ENCOUNTER — Ambulatory Visit: Payer: Medicare Other | Admitting: Family Medicine

## 2022-11-14 ENCOUNTER — Ambulatory Visit (INDEPENDENT_AMBULATORY_CARE_PROVIDER_SITE_OTHER): Payer: Medicare Other | Admitting: Family Medicine

## 2022-11-14 ENCOUNTER — Encounter: Payer: Self-pay | Admitting: Family Medicine

## 2022-11-14 VITALS — BP 122/76 | Wt 174.4 lb

## 2022-11-14 DIAGNOSIS — E1159 Type 2 diabetes mellitus with other circulatory complications: Secondary | ICD-10-CM

## 2022-11-14 DIAGNOSIS — I10 Essential (primary) hypertension: Secondary | ICD-10-CM | POA: Diagnosis not present

## 2022-11-14 DIAGNOSIS — D6489 Other specified anemias: Secondary | ICD-10-CM

## 2022-11-14 DIAGNOSIS — N1831 Chronic kidney disease, stage 3a: Secondary | ICD-10-CM | POA: Diagnosis not present

## 2022-11-14 DIAGNOSIS — D693 Immune thrombocytopenic purpura: Secondary | ICD-10-CM | POA: Diagnosis not present

## 2022-11-14 MED ORDER — HYDROCODONE-ACETAMINOPHEN 10-325 MG PO TABS: ORAL_TABLET | ORAL | 0 refills | Status: DC

## 2022-11-14 NOTE — Progress Notes (Signed)
Subjective:    Patient ID: Peter [name], male    DOB: [birth year], 81 y.o.   MRN: [id number]  HPI This patient was seen today for chronic pain  The medication list was reviewed and updated.  Location of Pain for which the patient has been treated with regarding narcotics: Primarily low back  Onset of this pain: Present for years   -Compliance with medication: Good compliance  - Number patient states they take daily: Most days 1 some days 2 some days none  -when was the last dose patient took?  2 days ago  The patient was advised the importance of maintaining medication and not using illegal substances with these.  Here for refills and follow up  The patient was educated that we can provide 3 monthly scripts for their medication, it is their responsibility to follow the instructions.  Side effects or complications from medications: Denies  Patient is aware that pain medications are meant to minimize the severity of the pain to allow their pain levels to improve to allow for better function. They are aware of that pain medications cannot totally remove their pain.  Due for UDT ( at least once per year) : Not recently  Scale of 1 to 10 ( 1 is least 10 is most) Your pain level without the medicine: 8 Your pain level with medication 4  Scale 1 to 10 ( 1-helps very little, 10 helps very well) How well does your pain medication reduce your pain so you can function better through out the day? 8  Quality of the pain: Throbbing aching  Persistence of the pain: Present all the time  Modifying factors: Worse with activity  HTN (hypertension), benign - Plan: Microalbumin/Creatinine Ratio, Urine, Basic metabolic panel, CBC with Differential, Lipid panel  Type 2 diabetes mellitus with other circulatory complication, without long-term current use of insulin (HCC) - Plan: Microalbumin/Creatinine Ratio, Urine, Basic metabolic panel, CBC with Differential, Lipid panel  Chronic kidney  disease, stage 3a (HCC) - Plan: Microalbumin/Creatinine Ratio, Urine, Basic metabolic panel, CBC with Differential, Lipid panel  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, 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 - Plan: Microalbumin/Creatinine Ratio, Urine, Basic metabolic panel, CBC with Differential, Lipid panel  Chronic idiopathic thrombocytopenia (HCC) - Plan: Microalbumin/Creatinine Ratio, Urine, Basic metabolic panel, CBC with Differential, Lipid panel       Review of Systems Denies chest tightness pressure pain shortness of breath bowel movements are good denies bleeding and bowel movement or urine    Objective:   Physical Exam  General-in no acute distress Eyes-no discharge Lungs-respiratory rate normal, CTA CV-no murmurs,RRR Extremities skin warm dry no edema Neuro grossly normal Behavior normal, alert       Assessment & Plan:  1. HTN (hypertension), benign HTN- patient seen for follow-up regarding HTN.   Diet, medication compliance, appropriate labs and refills were completed.   Importance of keeping blood pressure under good control to lessen the risk of complications discussed Regular follow-up visits discussed  - Microalbumin/Creatinine Ratio, Urine - Basic metabolic panel - CBC with Differential - Lipid panel  2. Type 2 diabetes mellitus with other circulatory complication, without long-term current use of insulin (HCC) Diabetes decent control in past A1c has been under decent control - Microalbumin/Creatinine Ratio, Urine - Basic metabolic panel - CBC with Differential - Lipid panel  3. Chronic kidney disease, stage 3a ([place]) Healthy diet Lab work ordered Await results - Microalbumin/Creatinine Ratio, Urine - Basic metabolic panel - CBC with Differential - Lipid panel  4.  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 Chronic idiopathic thrombocytopenia (HCC) Check CBC - Microalbumin/Creatinine Ratio, Urine - Basic metabolic panel - CBC with Differential - Lipid panel Follow-up 3 months

## 2022-11-17 DIAGNOSIS — L821 Other seborrheic keratosis: Secondary | ICD-10-CM | POA: Diagnosis not present

## 2022-11-17 DIAGNOSIS — L570 Actinic keratosis: Secondary | ICD-10-CM | POA: Diagnosis not present

## 2022-11-17 DIAGNOSIS — X32XXXD Exposure to sunlight, subsequent encounter: Secondary | ICD-10-CM | POA: Diagnosis not present

## 2022-11-17 DIAGNOSIS — L720 Epidermal cyst: Secondary | ICD-10-CM | POA: Diagnosis not present

## 2022-11-17 DIAGNOSIS — L820 Inflamed seborrheic keratosis: Secondary | ICD-10-CM | POA: Diagnosis not present

## 2022-11-17 DIAGNOSIS — C44311 Basal cell carcinoma of skin of nose: Secondary | ICD-10-CM | POA: Diagnosis not present

## 2022-11-17 DIAGNOSIS — D225 Melanocytic nevi of trunk: Secondary | ICD-10-CM | POA: Diagnosis not present

## 2022-12-20 ENCOUNTER — Telehealth: Payer: Self-pay | Admitting: *Deleted

## 2022-12-20 NOTE — Telephone Encounter (Signed)
Patient stated his wife was in hospital with Covid and he is her caretaker- he tested positive a few days ago and was wondering if he coulf get the medication sent in for himself to [facility] in [place]

## 2022-12-21 NOTE — Telephone Encounter (Signed)
In his situations based on his medication Paxlovid is not a good choice because of drug interactions Molnupiravir would be the better choice.  200 mg capsules 4 pills twice daily for 5 days, #40 To my understanding this medicine available at [facility] but might be available at other pharmacies as well.  If he is having increased problems or worsening issues he ought to follow-up(we can work him into the schedule if he is having shortness of breath issues or other concerning issues)

## 2022-12-21 NOTE — Telephone Encounter (Signed)
Pt contacted and verbalized understanding. Pt state he believes he is getting better and would like to hold off on medication at this time. Pt states he got sick on 12/10/21- pt states he feels he is over the worst of it. Pt states if he sees he is not improving by Friday he will give [place] a call. Pt states sometimes he has to take a deep breath but feels he does not need to be seen at this time.

## 2022-12-27 ENCOUNTER — Encounter: Payer: Self-pay | Admitting: Cardiology

## 2022-12-27 ENCOUNTER — Ambulatory Visit: Payer: Medicare Other | Attending: Cardiology | Admitting: Cardiology

## 2022-12-27 VITALS — BP 134/76 | HR 64 | Ht 72.0 in | Wt 173.0 lb

## 2022-12-27 DIAGNOSIS — I2510 Atherosclerotic heart disease of native coronary artery without angina pectoris: Secondary | ICD-10-CM | POA: Diagnosis not present

## 2022-12-27 DIAGNOSIS — I10 Essential (primary) hypertension: Secondary | ICD-10-CM

## 2022-12-27 DIAGNOSIS — E782 Mixed hyperlipidemia: Secondary | ICD-10-CM | POA: Diagnosis not present

## 2022-12-27 DIAGNOSIS — I5022 Chronic systolic (congestive) heart failure: Secondary | ICD-10-CM

## 2022-12-27 MED ORDER — TICAGRELOR 60 MG PO TABS: 60.0000 mg | ORAL_TABLET | Freq: Two times a day (BID) | ORAL | 0 refills | Status: DC

## 2022-12-27 MED ORDER — TICAGRELOR 60 MG PO TABS: 60.0000 mg | ORAL_TABLET | Freq: Two times a day (BID) | ORAL | 11 refills | Status: DC

## 2022-12-27 NOTE — Addendum Note (Signed)
Addended by: Berlinda Last on: 12/27/2022 03:23 PM   Modules accepted: Orders

## 2022-12-27 NOTE — Progress Notes (Signed)
Clinical Summary Mr. Ketelsen is a 82 y.o.male seen today for follow up of the following medical problems.      1. CAD/ICM/Chronic systolic HF - hx of prior CABG 1997 at [facility]  - stent to SVG-PDA and PTCA of the LAD in 05/2002. Normal LVEF at that time - 2008 heart cath [facility] had repeat stenting, he is unsure of the details - cath 2015 showing patent SVG-OM1-OM2, SVG-RI, SVG-D1, and LIMA-LAD with occluded SVG-PDA and severe stenosis of mid-LAD at LIMA insertion with DES placed),    - 04/2018 cath showed severe 3-vessel CAD with patent LIMA-LAD, patent stent along mid-LAD, patent SVG-OM1-OM2, and patent SVG-RI with occlusion of D1 and known occlusion of SVG-PDA. The diagonal occlusion was thought to be old, therefore continued medical therapy was recommended.  - 04/2018 echo LVEF 30-35%.  01/2020 LVEF 40-45% 02/2022 echo: LVEF 50-55%   02/2020 cath as reported below, DES to SVG-diag. Severe residual small vessel disease.        - no recent chest pains, occasional SOB at times. No recent edema.  - compliant with meds       2. HTN - compliant with meds   3. HL 11/2020 TC 133 TG 153 HDL 45 LDL 62 04/2021 TC 116 TG 83 HDL 44 LDL 56 -06/2022 TC 78 TG 50 HDL 38 LDL 30   4. AAA - followed by vascular - 6 cm by CT, elected for no intervention.   5. Recent covid infection Jan 2024  6. History of bilateral carotid artery stenting - followed by vascular Past Medical History:  Diagnosis Date   AAA (abdominal aortic aneurysm) (HCC)    Arthritis    CAD (coronary artery disease)    a. s/p CABG in 1997 b. stent to SVG-PDA and PTCA of LAD in 2003 c. cath in 2015 showing patent SVG-OM1-OM2, SVG-RI, SVG-D1, and LIMA-LAD with occluded SVG-PDA and severe stenosis of mid-LAD at LIMA insertion with DES placed d. 04/2018: similar results to 2015 with patent LAD stent; occlusion of small caliber D1 stenosis with medical management recommended.    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   Myocardial infarction [facility]) 1997   stents x2 (2003/2007)     Allergies  Allergen Reactions   Dye Fdc Red [Red Dye] Hives    hives   Ivp Dye [Iodinated Contrast Media] Hives   Plavix [Clopidogrel] Hives    Unsure if dye or Plavix   Keflex [Cephalexin] Other (See Comments)    hoarsness     Current Outpatient Medications  Medication Sig Dispense Refill   acetaminophen (TYLENOL) 500 MG tablet Take 1,000 mg by mouth every 6 (six) hours as needed for mild pain.     Aloe Vera Juice LIQD Take 8 oz by mouth daily.     aspirin EC 81 MG tablet Take 1 tablet (81 mg total) by mouth daily with breakfast. 90 tablet 3   Cholecalciferol (VITAMIN D3) 250 MCG (10000 UT)  TABS Take 10,000 Units by mouth 3 (three) times a week.     glucose blood (ACCU-CHEK GUIDE) test strip USE 1 STRIP TO CHECK GLUCOSE TWICE DAILY DUE  TO  FLUCTUATING  SUGARS 100 each 1   HYDROcodone-acetaminophen (NORCO) 10-325 MG tablet 1 twice daily as needed for severe pain caution drowsiness 60 tablet 0   HYDROcodone-acetaminophen (NORCO) 10-325 MG tablet 1 taken twice daily as needed for pain 60 tablet 0   HYDROcodone-acetaminophen (NORCO) 10-325 MG tablet 1 twice daily as needed pain 60 tablet 0   metoprolol succinate (TOPROL-XL) 25 MG 24 hr tablet Take 1 tablet (25 mg total) by mouth daily. 90 tablet 3   predniSONE (DELTASONE) 50 MG tablet      rosuvastatin (CRESTOR) 40 MG tablet Take 1 tablet (40 mg total) by mouth daily. 90 tablet 3   ticagrelor  (BRILINTA) 60 MG TABS tablet Take 1 tablet (60 mg total) by mouth 2 (two) times daily. 60 tablet 11   vitamin E 180 MG (400 UNITS) capsule Take 400 Units by mouth See admin instructions. Twice month     No current facility-administered medications for this visit.     Past Surgical History:  Procedure Laterality Date   CORONARY ANGIOPLASTY  2003/2007   2 stents   CORONARY ANGIOPLASTY WITH STENT PLACEMENT  08/14/2014   DES to LIMA-LAD insertion   CORONARY ARTERY BYPASS GRAFT  12/06/1995   7 vessels   CORONARY STENT INTERVENTION N/A 02/24/2020   Procedure: CORONARY STENT INTERVENTION;  Surgeon: Sherren Mocha, MD;  Location: [place] [facility];  Service: Cardiovascular;  Laterality: N/A;   CORONARY STENT PLACEMENT  08/15/2014   MID LAD  DES  by Dr Leslye Peer ANGIOGRAPHY N/A 02/24/2020   Procedure: Remus Blake ANGIOGRAPHY;  Surgeon: Sherren Mocha, MD;  Location: [place] [facility];  Service: Cardiovascular;  Laterality: N/A;   CYSTECTOMY  12/06/2003   top of head-Jenkins   EAR CYST EXCISION  07/09/2012   Procedure: CYST REMOVAL;  Surgeon: Jamesetta So, MD;  Location: [facility];  Service: General;  Laterality: N/A;   EYE SURGERY Right    cataract removal and then another surgery after   HERNIA REPAIR     1980's inguinal Left side   LEFT HEART CATH AND CORS/GRAFTS ANGIOGRAPHY N/A 04/16/2018   Procedure: LEFT HEART CATH AND CORS/GRAFTS ANGIOGRAPHY;  Surgeon: Martinique, Peter M, MD;  Location: [place] [facility];  Service: Cardiovascular;  Laterality: N/A;   LEFT HEART CATHETERIZATION WITH CORONARY/GRAFT ANGIOGRAM N/A 08/14/2014   Procedure: LEFT HEART CATHETERIZATION WITH Beatrix Fetters;  Surgeon: Blane Ohara, MD;  Location: [facility] [place] [facility] CATH LAB;  Service: Cardiovascular;  Laterality: N/A;   TEE WITHOUT CARDIOVERSION N/A 02/14/2022   Procedure: TRANSESOPHAGEAL ECHOCARDIOGRAM (TEE);  Surgeon: Freada Bergeron, MD;  Location: [facility];  Service: Cardiovascular;   Laterality: N/A;   TRANSCAROTID ARTERY REVASCULARIZATION  Left 03/25/2022   Procedure: LEFT TRANCAROTID ARTERY REVASCULARIZATION USING 52m X 430mENROUTE STENT SYSTEM;  Surgeon: CaWaynetta SandyMD;  [place] [facility] Service: Vascular;  Laterality: Left;   TRANSCAROTID ARTERY REVASCULARIZATION  Right 06/10/2022   Procedure: Right Transcarotid Artery Revascularization;  Surgeon: CaWaynetta SandyMD;  Location: [facility] Service: Vascular;  Laterality: Right;   ULTRASOUND GUIDANCE FOR VASCULAR ACCESS N/A 03/25/2022   Procedure: ULTRASOUND GUIDANCE FOR VASCULAR ACCESS;  Surgeon: CaWaynetta SandyMD;  [place] [facility] Vascular;  Laterality: N/A;   ULTRASOUND GUIDANCE FOR VASCULAR ACCESS Left 06/10/2022  Procedure: ULTRASOUND GUIDANCE FOR VASCULAR ACCESS, LEFT FEMORAL VEIN;  Surgeon: Waynetta Sandy, MD;  Location: [place];  Service: Vascular;  Laterality: Left;     Allergies  Allergen Reactions   Dye Fdc Red [Red Dye] Hives    hives   Ivp Dye [Iodinated Contrast Media] Hives   Plavix [Clopidogrel] Hives    Unsure if dye or Plavix   Keflex [Cephalexin] Other (See Comments)    hoarsness      Family History  Problem Relation Age of Onset   Other Father        deceased after ?TCS or barium enema, age 71s   Hypertension Father    Heart attack Mother    Colon cancer Neg Hx    Liver disease Neg Hx      Social History Mr. Duran reports that he quit smoking about 22 years ago. His smoking use included cigarettes. He has a 120.00 pack-year smoking history. He has never used smokeless tobacco. Mr. Carriger reports that he does not currently use alcohol.   Review of Systems CONSTITUTIONAL: No weight loss, fever, chills, weakness or fatigue.  HEENT: Eyes: No visual loss, blurred vision, double vision or yellow sclerae.No hearing loss, sneezing, congestion, runny nose or sore throat.  SKIN: No rash or itching.  CARDIOVASCULAR: per  hpi RESPIRATORY: No shortness of breath, cough or sputum.  GASTROINTESTINAL: No anorexia, nausea, vomiting or diarrhea. No abdominal pain or blood.  GENITOURINARY: No burning on urination, no polyuria NEUROLOGICAL: No headache, dizziness, syncope, paralysis, ataxia, numbness or tingling in the extremities. No change in bowel or bladder control.  MUSCULOSKELETAL: No muscle, back pain, joint pain or stiffness.  LYMPHATICS: No enlarged nodes. No history of splenectomy.  PSYCHIATRIC: No history of depression or anxiety.  ENDOCRINOLOGIC: No reports of sweating, cold or heat intolerance. No polyuria or polydipsia.  Marland Kitchen   Physical Examination Today's Vitals   12/27/22 1457  BP: 134/76  Pulse: 64  SpO2: 98%  Weight: 173 lb (78.5 kg)  Height: 6' (1.829 m)   Body mass index is 23.46 kg/m.  Gen: resting comfortably, no acute distress HEENT: no scleral icterus, pupils equal round and reactive, no palptable cervical adenopathy,  CV: RRR, no m/r/g, no jvd Resp: Clear to auscultation bilaterally GI: abdomen is soft, non-tender, non-distended, normal bowel sounds, no hepatosplenomegaly MSK: extremities are warm, no edema.  Skin: warm, no rash Neuro:  no focal deficits Psych: appropriate affect   Diagnostic Studies  02/2020 echo IMPRESSIONS     1. Left ventricular ejection fraction, by estimation, is 40 to 45%. The  left ventricle has mildly decreased function. The left ventricle  demonstrates regional wall motion abnormalities (see scoring  diagram/findings for description). There is mild  concentric left ventricular hypertrophy. Left ventricular diastolic  parameters are consistent with Grade I diastolic dysfunction (impaired  relaxation).   2. Right ventricular systolic function is normal. The right ventricular  size is normal. Tricuspid regurgitation signal is inadequate for assessing  PA pressure.   3. The mitral valve is grossly normal. No evidence of mitral valve   regurgitation. No evidence of mitral stenosis.   4. The aortic valve is tricuspid. Aortic valve regurgitation is trivial.  Mild aortic valve sclerosis is present, with no evidence of aortic valve  stenosis.   5. Aortic dilatation noted. 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   6. The inferior vena cava is normal in size with greater than 50%  respiratory variability, suggesting  right atrial pressure of 3 mmHg.    02/2020 cath 1. 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 2. S/P CABG with continued patency of the LIMA-LAD, sequential SVG-OM1 and OM2, and severe stenosis of the SVG-diagonal 3. Chronic occlusion of the SVG-PDA/PLA branches with left-to-right collaterals present 4. Successful SVG-PCI with stenting of the proximal and distal body of the SVG-diagonal, with transient no-reflow and severe small vessel distal disease.    Recommend aggressive medical therapy, minimum 12 months of ASA/ticagrelor consider long-term if tolerated       Assessment and Plan   1. CAD/ICM/Chronic systolic heart - given extensive CAD history and plavix allergy have elected for long term brillinta at lower dose of '60mg'$  bid  - no symptoms. - medical therapy limited by renal dysfunction, most recent LVEF had improved to low normal - continue current meds   2. Hyperlipidemia - at goal, continue current meds   3. HTN -at goal, continue current meds  F/u 6 months     Arnoldo Lenis, M.D.

## 2022-12-27 NOTE — Patient Instructions (Addendum)
Medication Instructions:  Your physician recommends that you continue on your current medications as directed. Please refer to the Current Medication list given to you today.   Labwork: None  Testing/Procedures: None  Follow-Up: Follow up with Dr. Harl Bowie in 6 months.   Any Other Special Instructions Will Be Listed Below (If Applicable).  Brilinta 60 mg tablets- Sample Lot #:[id number] Exp : 05/04/2024   If you need a refill on your cardiac medications before your next appointment, please call your pharmacy.

## 2023-01-05 DIAGNOSIS — Z85828 Personal history of other malignant neoplasm of skin: Secondary | ICD-10-CM | POA: Diagnosis not present

## 2023-01-05 DIAGNOSIS — Z08 Encounter for follow-up examination after completed treatment for malignant neoplasm: Secondary | ICD-10-CM | POA: Diagnosis not present

## 2023-01-07 ENCOUNTER — Other Ambulatory Visit: Payer: Self-pay | Admitting: Family Medicine

## 2023-01-10 ENCOUNTER — Other Ambulatory Visit: Payer: Self-pay | Admitting: Family Medicine

## 2023-01-10 MED ORDER — ACCU-CHEK GUIDE W/DEVICE KIT: PACK | 0 refills | Status: AC

## 2023-01-18 ENCOUNTER — Telehealth: Payer: Self-pay | Admitting: *Deleted

## 2023-01-18 NOTE — Telephone Encounter (Signed)
Pt approved for Brilinta patient assistance program through 12/05/23. Pt notified and thankful for the call.

## 2023-02-14 ENCOUNTER — Ambulatory Visit: Payer: Medicare Other | Admitting: Family Medicine

## 2023-02-22 DIAGNOSIS — D6489 Other specified anemias: Secondary | ICD-10-CM | POA: Diagnosis not present

## 2023-02-22 DIAGNOSIS — I10 Essential (primary) hypertension: Secondary | ICD-10-CM | POA: Diagnosis not present

## 2023-02-22 DIAGNOSIS — N1831 Chronic kidney disease, stage 3a: Secondary | ICD-10-CM | POA: Diagnosis not present

## 2023-02-22 DIAGNOSIS — D693 Immune thrombocytopenic purpura: Secondary | ICD-10-CM | POA: Diagnosis not present

## 2023-02-22 DIAGNOSIS — E1159 Type 2 diabetes mellitus with other circulatory complications: Secondary | ICD-10-CM | POA: Diagnosis not present

## 2023-02-23 LAB — CBC WITH DIFFERENTIAL/PLATELET
Basophils Absolute: 0.1 10*3/uL (ref 0.0–0.2)
Basos: 1 %
EOS (ABSOLUTE): 0.6 10*3/uL — ABNORMAL HIGH (ref 0.0–0.4)
Eos: 8 %
Hematocrit: 40.7 % (ref 37.5–51.0)
Hemoglobin: 14 g/dL (ref 13.0–17.7)
Immature Grans (Abs): 0 10*3/uL (ref 0.0–0.1)
Immature Granulocytes: 0 %
Lymphocytes Absolute: 2.5 10*3/uL (ref 0.7–3.1)
Lymphs: 33 %
MCH: 32.9 pg (ref 26.6–33.0)
MCHC: 34.4 g/dL (ref 31.5–35.7)
MCV: 96 fL (ref 79–97)
Monocytes Absolute: 0.6 10*3/uL (ref 0.1–0.9)
Monocytes: 8 %
Neutrophils Absolute: 3.9 10*3/uL (ref 1.4–7.0)
Neutrophils: 50 %
Platelets: 142 10*3/uL — ABNORMAL LOW (ref 150–450)
RBC: 4.25 x10E6/uL (ref 4.14–5.80)
RDW: 12.8 % (ref 11.6–15.4)
WBC: 7.7 10*3/uL (ref 3.4–10.8)

## 2023-02-23 LAB — LIPID PANEL
Chol/HDL Ratio: 2.6 ratio (ref 0.0–5.0)
Cholesterol, Total: 113 mg/dL (ref 100–199)
HDL: 44 mg/dL (ref >39)
LDL Chol Calc (NIH): 50 mg/dL (ref 0–99)
Triglycerides: 103 mg/dL (ref 0–149)
VLDL Cholesterol Cal: 19 mg/dL (ref 5–40)

## 2023-02-23 LAB — BASIC METABOLIC PANEL
BUN/Creatinine Ratio: 11 (ref 10–24)
BUN: 16 mg/dL (ref 8–27)
CO2: 23 mmol/L (ref 20–29)
Calcium: 9.7 mg/dL (ref 8.6–10.2)
Chloride: 103 mmol/L (ref 96–106)
Creatinine, Ser: 1.5 mg/dL — ABNORMAL HIGH (ref 0.76–1.27)
Glucose: 103 mg/dL — ABNORMAL HIGH (ref 70–99)
Potassium: 4.1 mmol/L (ref 3.5–5.2)
Sodium: 141 mmol/L (ref 134–144)
eGFR: 47 mL/min/{1.73_m2} — ABNORMAL LOW (ref >59)

## 2023-02-23 LAB — MICROALBUMIN / CREATININE URINE RATIO
Creatinine, Urine: 139.2 mg/dL
Microalb/Creat Ratio: 10 mg/g creat (ref 0–29)
Microalbumin, Urine: 13.3 ug/mL

## 2023-02-27 ENCOUNTER — Ambulatory Visit (INDEPENDENT_AMBULATORY_CARE_PROVIDER_SITE_OTHER): Payer: Medicare Other | Admitting: Family Medicine

## 2023-02-27 VITALS — BP 162/84 | HR 63 | Ht 72.0 in | Wt 175.8 lb

## 2023-02-27 DIAGNOSIS — E1122 Type 2 diabetes mellitus with diabetic chronic kidney disease: Secondary | ICD-10-CM | POA: Diagnosis not present

## 2023-02-27 DIAGNOSIS — I7132 Juxtarenal abdominal aortic aneurysm, ruptured: Secondary | ICD-10-CM | POA: Insufficient documentation

## 2023-02-27 DIAGNOSIS — I25709 Atherosclerosis of coronary artery bypass graft(s), unspecified, with unspecified angina pectoris: Secondary | ICD-10-CM

## 2023-02-27 DIAGNOSIS — N1831 Chronic kidney disease, stage 3a: Secondary | ICD-10-CM | POA: Diagnosis not present

## 2023-02-27 DIAGNOSIS — I10 Essential (primary) hypertension: Secondary | ICD-10-CM | POA: Diagnosis not present

## 2023-02-27 DIAGNOSIS — E1159 Type 2 diabetes mellitus with other circulatory complications: Secondary | ICD-10-CM

## 2023-02-27 DIAGNOSIS — D693 Immune thrombocytopenic purpura: Secondary | ICD-10-CM | POA: Diagnosis not present

## 2023-02-27 DIAGNOSIS — J449 Chronic obstructive pulmonary disease, unspecified: Secondary | ICD-10-CM | POA: Diagnosis not present

## 2023-02-27 DIAGNOSIS — N1832 Chronic kidney disease, stage 3b: Secondary | ICD-10-CM | POA: Diagnosis not present

## 2023-02-27 DIAGNOSIS — R52 Pain, unspecified: Secondary | ICD-10-CM

## 2023-02-27 MED ORDER — HYDROCODONE-ACETAMINOPHEN 10-325 MG PO TABS: ORAL_TABLET | ORAL | 0 refills | Status: DC

## 2023-02-27 MED ORDER — LOSARTAN POTASSIUM 25 MG PO TABS: 25.0000 mg | ORAL_TABLET | Freq: Every day | ORAL | 4 refills | Status: DC

## 2023-02-27 NOTE — Progress Notes (Signed)
Subjective:    Patient ID: Peter [name], male    DOB: 1941-08-29, 82 y.o.   MRN: [id number]  HPI Patient arrives today for 3 month follow up. Patient has concerns about lab work.  This patient was seen today for chronic pain  The medication list was reviewed and updated.  Location of Pain for which the patient has been treated with regarding narcotics: Lumbar pain and neck pain  Onset of this pain: Present for years   -Compliance with medication: Uses medicine intermittently has good compliance  - Number patient states they take daily: On some days note pain medicine on some days 1 occasionally 2  -when was the last dose patient took?  He states yesterday  The patient was advised the importance of maintaining medication and not using illegal substances with these.  Here for refills and follow up  The patient was educated that we can provide 3 monthly scripts for their medication, it is their responsibility to follow the instructions.  Side effects or complications from medications: He denies any side effects  Patient is aware that pain medications are meant to minimize the severity of the pain to allow their pain levels to improve to allow for better function. They are aware of that pain medications cannot totally remove their pain.  Due for UDT ( at least once per year) : None recently  Scale of 1 to 10 ( 1 is least 10 is most) Your pain level without the medicine: 8 Your pain level with medication 4  Scale 1 to 10 ( 1-helps very little, 10 helps very well) How well does your pain medication reduce your pain so you can function better through out the day? 7  Quality of the pain: Throbbing aching  Persistence of the pain: Present all the time  Modifying factors: Worse with activity  Is not able to take NSAIDs Tylenol does not alleviate enough of this pain  Blood pressure not under good control has been running high lately.  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)  Chronic obstructive pulmonary disease, unspecified COPD type ([place]), Chronic  Chronic idiopathic thrombocytopenia (HCC), Chronic  Coronary artery disease involving coronary bypass graft of native heart with angina pectoris (HCC), Chronic  Type 2 diabetes mellitus with stage 3b chronic kidney disease, without long-term current use of insulin ([place]), Chronic  Pain management   Review of Systems     Objective:   Physical Exam General-in no acute distress Eyes-no discharge Lungs-respiratory rate normal, CTA CV-no murmurs,RRR Extremities skin warm dry no edema Neuro grossly normal Behavior normal, alert  I did encourage patient to do eye exam      Assessment & Plan:  1. HTN (hypertension), benign Blood pressure not under decent control continue current measures but add losartan 25 mg metabolic 7 in approximately 10 days with a follow-up face-to-face within 2 weeks - Basic Metabolic Panel (7)  2. Type 2 diabetes mellitus with other circulatory complication, without long-term current use of insulin (HCC) Keep A1c under good control check lab work.  Continue dietary measures patient states his sugars been  staying around the 100 range sometimes 90 which is low for him he is trying to eat healthy - Hemoglobin A1c  3. Chronic kidney disease, stage 3a ([place]) Will monitor creatinine and see how responsive losartan expected to go up some but if it goes up dramatically we will have to try something different Not a good candidate for amlodipine due to red dye  allergies   5. Chronic obstructive pulmonary disease, unspecified COPD type ([place]) Seen on CT scan previously patient doing okay with his breathing currently  6. Chronic idiopathic thrombocytopenia (HCC) No bleeding issues does have thrombocytopenia  7. Coronary artery disease involving coronary bypass graft of native heart with angina pectoris ([place]) Does get out of breath when he pushes himself but no severe chest tightness pressure pain  8. Type 2 diabetes mellitus with stage 3b chronic kidney disease, without long-term current use of insulin ([place]) Being monitored closely.  Check A1c.  9. Pain management 3 prescriptions were sent in he does not use 2 pills a day some days only 1, some days 2, some days none we will go ahead and send in 3 prescriptions  The patient was seen in followup for chronic pain. A review over at their current pain status was discussed. Drug registry was checked. Prescriptions were given.  Regular follow-up recommended. Discussion was held regarding the importance of compliance with medication as well as pain medication contract.  Patient was informed that medication may cause drowsiness and should not be combined  with other medications/alcohol or street drugs. If the patient feels medication is causing altered alertness then do not drive or operate dangerous equipment.  Should be noted that the patient appears to be meeting appropriate use of opioids and response.  Evidenced by improved function and decent pain control without significant side effects and no evidence of overt aberrancy issues.  Upon discussion with the patient today they understand that opioid therapy is optional and they feel that the pain has been refractory to reasonable conservative measures and is significant and affecting quality of life enough to warrant ongoing therapy and wishes to continue opioids.  Refills were provided. He states the pain medicine does not make him drowsy he denies  abusing it.  Keeps it in a safe place

## 2023-03-10 DIAGNOSIS — I10 Essential (primary) hypertension: Secondary | ICD-10-CM | POA: Diagnosis not present

## 2023-03-10 DIAGNOSIS — E1159 Type 2 diabetes mellitus with other circulatory complications: Secondary | ICD-10-CM | POA: Diagnosis not present

## 2023-03-11 LAB — BASIC METABOLIC PANEL (7)
BUN/Creatinine Ratio: 12 (ref 10–24)
BUN: 18 mg/dL (ref 8–27)
CO2: 24 mmol/L (ref 20–29)
Chloride: 102 mmol/L (ref 96–106)
Creatinine, Ser: 1.47 mg/dL — ABNORMAL HIGH (ref 0.76–1.27)
Glucose: 149 mg/dL — ABNORMAL HIGH (ref 70–99)
Potassium: 4.5 mmol/L (ref 3.5–5.2)
Sodium: 140 mmol/L (ref 134–144)
eGFR: 48 mL/min/{1.73_m2} — ABNORMAL LOW (ref >59)

## 2023-03-11 LAB — HEMOGLOBIN A1C
Est. average glucose Bld gHb Est-mCnc: 134 mg/dL
Hgb A1c MFr Bld: 6.3 % — ABNORMAL HIGH (ref 4.8–5.6)

## 2023-03-13 ENCOUNTER — Ambulatory Visit (INDEPENDENT_AMBULATORY_CARE_PROVIDER_SITE_OTHER): Payer: Medicare Other | Admitting: Family Medicine

## 2023-03-13 VITALS — BP 134/76 | Ht 72.0 in | Wt 177.8 lb

## 2023-03-13 DIAGNOSIS — I10 Essential (primary) hypertension: Secondary | ICD-10-CM

## 2023-03-13 DIAGNOSIS — N1831 Chronic kidney disease, stage 3a: Secondary | ICD-10-CM | POA: Diagnosis not present

## 2023-03-13 NOTE — Progress Notes (Signed)
   Subjective:    Patient ID: Peter [name], male    DOB: 17-Dec-1940, 82 y.o.   MRN: [id number]  HPI  Patient arrives for a 2 week follow up on hypertension Patient tolerating blood pressure medicine Denies any high fever chills sweats No wheezing or difficulty breathing Review of Systems     Objective:   Physical Exam General-in no acute distress Eyes-no discharge Lungs-respiratory rate normal, CTA CV-no murmurs,RRR Extremities skin warm dry no edema Neuro grossly normal Behavior normal, alert  We did discuss A1c is reasonable at 6.3 Creatinine actually somewhat better than what it was CKD 3A       Assessment & Plan:  Blood pressure on recheck good Continue current medications Follow-up as planned in July  Patient to be careful with his diet Avoid excessive salt Continue current medication Minimize salt in the diet

## 2023-03-22 DIAGNOSIS — Z85828 Personal history of other malignant neoplasm of skin: Secondary | ICD-10-CM | POA: Diagnosis not present

## 2023-03-22 DIAGNOSIS — B9689 Other specified bacterial agents as the cause of diseases classified elsewhere: Secondary | ICD-10-CM | POA: Diagnosis not present

## 2023-03-22 DIAGNOSIS — L0202 Furuncle of face: Secondary | ICD-10-CM | POA: Diagnosis not present

## 2023-03-22 DIAGNOSIS — L905 Scar conditions and fibrosis of skin: Secondary | ICD-10-CM | POA: Diagnosis not present

## 2023-03-22 DIAGNOSIS — Z08 Encounter for follow-up examination after completed treatment for malignant neoplasm: Secondary | ICD-10-CM | POA: Diagnosis not present

## 2023-03-27 ENCOUNTER — Telehealth: Payer: Self-pay | Admitting: Family Medicine

## 2023-03-27 MED ORDER — ROSUVASTATIN CALCIUM 40 MG PO TABS: 40.0000 mg | ORAL_TABLET | Freq: Every day | ORAL | 0 refills | Status: DC

## 2023-03-27 NOTE — Telephone Encounter (Signed)
Refill on rosuvastatin (CRESTOR) 40 MG tablet  [facility]

## 2023-03-27 NOTE — Telephone Encounter (Signed)
Received via fax Rx request: Prescription sent electronically to pharmacy  

## 2023-05-03 ENCOUNTER — Other Ambulatory Visit: Payer: Self-pay | Admitting: Family Medicine

## 2023-05-03 ENCOUNTER — Telehealth: Payer: Self-pay

## 2023-05-03 MED ORDER — METOPROLOL SUCCINATE ER 25 MG PO TB24: 25.0000 mg | ORAL_TABLET | Freq: Every day | ORAL | 3 refills | Status: DC

## 2023-05-03 NOTE — Telephone Encounter (Signed)
Rx sent as requested.

## 2023-05-03 NOTE — Telephone Encounter (Signed)
Prescription Request  05/03/2023  LOV: Visit date not found  What is the name of the medication or equipment? metoprolol succinate (TOPROL-XL) 25 MG 24 hr tablet   Have you contacted your pharmacy to request a refill?  Yes  Which pharmacy would you like this sent to?  [facility] [address], [place] - [address] [place] [address] Phone: [phone] Fax: [phone]    Patient notified that their request is being sent to the clinical staff for review and that they should receive a response within 2 business days.   Please advise at Mobile [phone] (mobile)

## 2023-05-17 NOTE — Progress Notes (Deleted)
HISTORY AND PHYSICAL     CC:  follow up. Requesting Provider:  Babs Sciara, MD  HPI: This is a 82 y.o. male here for follow up for carotid artery stenosis.  Pt is s/p left TCAR for symptomatic carotid artery stenosis on 03/25/2022 by Dr. Randie Heinz and stenting of right TCAR on 06/10/2022 for asymptomatic carotid artery stenosis by Dr. Randie Heinz.    Pt was last seen in September and at that time pt was doing well from carotid standpoint.  He had a CTA to evaluate his known AAA with phone visit.  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  Dr. Randie Heinz recommended referral to [facility] for consideration of 4 vessel fenestrated device or open aneurysm repair at their discretion.  Pt returns today for follow up.    Pt *** any amaurosis fugax, speech difficulties, weakness, numbness, paralysis or clumsiness or facial droop.    ***  The pt is on a statin for cholesterol management.  The pt is on a daily aspirin.   Other AC:  Brilinta The pt is on ARB for hypertension.   The pt is not on medication for diabetes Tobacco hx:  former  Pt does *** have family hx of AAA.  Past Medical History:  Diagnosis Date   AAA (abdominal aortic aneurysm) (HCC)    Arthritis    CAD (coronary artery disease)    a. s/p CABG in 1997 b. stent to SVG-PDA and PTCA of LAD in 2003 c. cath in 2015 showing patent SVG-OM1-OM2, SVG-RI, SVG-D1, and LIMA-LAD with occluded SVG-PDA and severe stenosis of mid-LAD at LIMA insertion with DES placed d. 04/2018: similar results to 2015 with patent LAD stent; occlusion of small caliber D1 stenosis with medical management recommended.    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   Myocardial infarction [facility]) 1997   stents x2 (2003/2007)    Past Surgical History:  Procedure Laterality Date   CORONARY ANGIOPLASTY  2003/2007   2 stents   CORONARY ANGIOPLASTY WITH STENT PLACEMENT  08/14/2014   DES to LIMA-LAD insertion   CORONARY ARTERY BYPASS GRAFT  12/06/1995   7 vessels   CORONARY STENT INTERVENTION N/A 02/24/2020   Procedure: CORONARY STENT INTERVENTION;  Surgeon: Tonny Bollman, MD;  Location: [facility] INVASIVE CV LAB;  Service: Cardiovascular;  Laterality: N/A;   CORONARY STENT PLACEMENT  08/15/2014   MID LAD  DES  by Dr Normajean Glasgow ANGIOGRAPHY N/A 02/24/2020   Procedure: Rosalie Doctor ANGIOGRAPHY;  Surgeon: Tonny Bollman, MD;  Location: [facility] INVASIVE CV LAB;  Service: Cardiovascular;  Laterality: N/A;   CYSTECTOMY  12/06/2003   top of head-Jenkins   EAR CYST EXCISION  07/09/2012   Procedure: CYST REMOVAL;  Surgeon: Dalia Heading, MD;  Location: [facility] ORS;  Service: General;  Laterality: N/A;   EYE SURGERY Right    cataract removal and then another surgery after   HERNIA REPAIR     1980's inguinal Left side   LEFT HEART CATH AND CORS/GRAFTS ANGIOGRAPHY N/A 04/16/2018   Procedure: LEFT HEART CATH AND CORS/GRAFTS ANGIOGRAPHY;  Surgeon: Swaziland, Peter M, MD;  Location: [facility] INVASIVE CV LAB;  Service: Cardiovascular;  Laterality: N/A;   LEFT HEART CATHETERIZATION WITH CORONARY/GRAFT ANGIOGRAM N/A 08/14/2014   Procedure: LEFT HEART CATHETERIZATION WITH Isabel Caprice;  Surgeon: Micheline Chapman, MD;  Location: [facility] CATH LAB;  Service: Cardiovascular;  Laterality: N/A;   TEE WITHOUT CARDIOVERSION  N/A 02/14/2022   Procedure: TRANSESOPHAGEAL ECHOCARDIOGRAM (TEE);  Surgeon: Meriam Sprague, MD;  Location: [facility];  Service: Cardiovascular;  Laterality: N/A;   TRANSCAROTID ARTERY REVASCULARIZATION  Left 03/25/2022   Procedure: LEFT TRANCAROTID ARTERY REVASCULARIZATION USING 10mm X 40mm ENROUTE STENT SYSTEM;  Surgeon: Maeola Harman, MD;  Location: [facility] OR;  Service: Vascular;  Laterality: Left;   TRANSCAROTID ARTERY REVASCULARIZATION  Right 06/10/2022   Procedure: Right Transcarotid Artery Revascularization;  Surgeon: Maeola Harman, MD;  Location: [facility] OR;  Service: Vascular;  Laterality: Right;   ULTRASOUND GUIDANCE FOR VASCULAR ACCESS N/A 03/25/2022   Procedure: ULTRASOUND GUIDANCE FOR VASCULAR ACCESS;  Surgeon: Maeola Harman, MD;  [facility];  Service: Vascular;  Laterality: N/A;   ULTRASOUND GUIDANCE FOR VASCULAR ACCESS Left 06/10/2022   Procedure: ULTRASOUND GUIDANCE FOR VASCULAR ACCESS, LEFT FEMORAL VEIN;  Surgeon: Maeola Harman, MD;  Location: [facility] OR;  Service: Vascular;  Laterality: Left;    Allergies  Allergen Reactions   Dye Fdc Red [Red Dye] Hives    hives   Ivp Dye [Iodinated Contrast Media] Hives   Plavix [Clopidogrel] Hives    Unsure if dye or Plavix   Keflex [Cephalexin] Other (See Comments)    hoarsness    Current Outpatient Medications  Medication Sig Dispense Refill   acetaminophen (TYLENOL) 500 MG tablet Take 1,000 mg by mouth every 6 (six) hours as needed for mild pain.     Aloe Vera Juice LIQD Take 8 oz by mouth daily.     aspirin EC 81 MG tablet Take 1 tablet (81 mg total) by mouth daily with breakfast. 90 tablet 3   Blood Glucose Monitoring Suppl (ACCU-CHEK GUIDE) w/Device KIT Use to check blood sugars BID DX. E11.9 1 kit 0   Cholecalciferol (VITAMIN D3) 250 MCG (10000 UT) TABS Take 10,000 Units by mouth 3 (three) times a week.     glucose blood (ACCU-CHEK GUIDE) test strip USE 1 STRIP TO CHECK GLUCOSE TWICE  DAILY DUE TO FLUCTUATING SUGARS 100 each 1   HYDROcodone-acetaminophen (NORCO) 10-325 MG tablet 1 taken twice daily as needed for pain 60 tablet 0   HYDROcodone-acetaminophen (NORCO) 10-325 MG tablet 1 twice daily as needed pain 60 tablet 0   HYDROcodone-acetaminophen (NORCO) 10-325 MG tablet 1 twice daily as needed for severe pain caution drowsiness 60 tablet 0   losartan (COZAAR) 25 MG tablet Take 1 tablet (25 mg total) by mouth daily. 30 tablet 4   metoprolol succinate (TOPROL-XL) 25 MG 24 hr tablet Take 1 tablet (25 mg total) by mouth daily. 90 tablet 3   predniSONE (DELTASONE) 50 MG tablet      rosuvastatin (CRESTOR) 40 MG tablet Take 1 tablet (40 mg total) by mouth daily. 90 tablet 0   ticagrelor (BRILINTA) 60 MG TABS tablet Take 1 tablet (60 mg total) by mouth 2 (two) times  daily. 60 tablet 11   ticagrelor (BRILINTA) 60 MG TABS tablet Take 1 tablet (60 mg total) by mouth 2 (two) times daily. 28 tablet 0   vitamin E 180 MG (400 UNITS) capsule Take 400 Units by mouth See admin instructions. Twice month     No current facility-administered medications for this visit.    Family History  Problem Relation Age of Onset   Other Father        deceased after ?TCS or barium enema, age 70s   Hypertension Father    Heart attack Mother    Colon cancer Neg Hx    Liver disease Neg Hx     Social History   Socioeconomic History   Marital status: Married    Spouse name: Malachi Bonds   Number of children: 2   Years of education: Not on file   Highest education level: Not on file  Occupational History   Not on file  Tobacco Use   Smoking status: Former    Packs/day: 3.00    Years: 40.00    Additional pack years: 0.00    Total pack years: 120.00    Types: Cigarettes    Quit date: 07/02/2000    Years since quitting: 22.8   Smokeless tobacco: Never  Vaping Use   Vaping Use: Never used  Substance and Sexual Activity   Alcohol use: Not Currently   Drug use: No   Sexual activity: Not Currently     Birth control/protection: None  Other Topics Concern   Not on file  Social History Narrative   Married x 62 years. Cares for wife at home. Family assists.   Social Determinants of Health   Financial Resource Strain: Low Risk  (08/23/2022)   Overall Financial Resource Strain (CARDIA)    Difficulty of Paying Living Expenses: Not hard at all  Food Insecurity: No Food Insecurity (08/23/2022)   Hunger Vital Sign    Worried About Running Out of Food in the Last Year: Never true    Ran Out of Food in the Last Year: Never true  Transportation Needs: No Transportation Needs (08/23/2022)   PRAPARE - Administrator, Civil Service (Medical): No    Lack of Transportation (Non-Medical): No  Physical Activity: Sufficiently Active (08/23/2022)   Exercise Vital Sign    Days of Exercise per Week: 5 days    Minutes of Exercise per Session: 60 min  Stress: Stress Concern Present (08/23/2022)   [employer] of Occupational Health - Occupational Stress Questionnaire    Feeling of Stress : To some extent  Social Connections: Moderately Isolated (08/23/2022)   Social Connection and Isolation Panel [NHANES]    Frequency of Communication with Friends and Family: Three times a week    Frequency of Social Gatherings with Friends and Family: Twice a week    Attends Religious Services: Never    Database administrator or Organizations: No    Attends Banker Meetings: Never    Marital Status: Married  Catering manager Violence: Not At Risk (08/23/2022)   Humiliation, Afraid, Rape, and Kick questionnaire    Fear of Current or Ex-Partner: No    Emotionally Abused: No    Physically Abused: No    Sexually Abused: No     REVIEW OF SYSTEMS:  *** [X]  denotes positive finding, [ ]  denotes negative finding Cardiac  Comments:  Chest pain or chest pressure:    Shortness of breath upon exertion:    Short of breath when  lying flat:    Irregular heart rhythm:        Vascular    Pain  in calf, thigh, or hip brought on by ambulation:    Pain in feet at night that wakes you up from your sleep:     Blood clot in your veins:    Leg swelling:         Pulmonary    Oxygen at home:    Productive cough:     Wheezing:         Neurologic    Sudden weakness in arms or legs:     Sudden numbness in arms or legs:     Sudden onset of difficulty speaking or slurred speech:    Temporary loss of vision in one eye:     Problems with dizziness:         Gastrointestinal    Blood in stool:     Vomited blood:         Genitourinary    Burning when urinating:     Blood in urine:        Psychiatric    Major depression:         Hematologic    Bleeding problems:    Problems with blood clotting too easily:        Skin    Rashes or ulcers:        Constitutional    Fever or chills:      PHYSICAL EXAMINATION:  ***  General:  WDWN in NAD; vital signs documented above Gait: Not observed HENT: WNL, normocephalic Pulmonary: normal non-labored breathing Cardiac: {Desc; regular/irreg:14544} HR, {With/Without:20273} carotid bruit*** Abdomen: soft, NT; aortic pulse is *** palpable Skin: {With/Without:20273} rashes Vascular Exam/Pulses:  Right Left  Radial {Exam; arterial pulse strength 0-4:30167} {Exam; arterial pulse strength 0-4:30167}  Popliteal {Exam; arterial pulse strength 0-4:30167} {Exam; arterial pulse strength 0-4:30167}  DP {Exam; arterial pulse strength 0-4:30167} {Exam; arterial pulse strength 0-4:30167}  PT {Exam; arterial pulse strength 0-4:30167} {Exam; arterial pulse strength 0-4:30167}   Extremities: {With/Without:20273} open wounds Musculoskeletal: no muscle wasting or atrophy  Neurologic: A&O X 3; moving all extremities equally; speech is fluent/normal Psychiatric:  The pt has {Desc; normal/abnormal:11317::"Normal"} affect.   Non-Invasive Vascular Imaging:   Carotid Duplex on 05/24/2023 Right:  ***% ICA stenosis Left:  ***% ICA stenosis ***  Previous  Carotid duplex on 07/20/2022: Right: patent stent without stenosis Left:   patent stent without stenosis    ASSESSMENT/PLAN:: 82 y.o. male here for follow up carotid artery stenosis and ***  -duplex today reveals *** -discussed s/s of stroke with pt and ***he understands should ***he develop any of these sx, ***he will go to the nearest ER or call 911. -pt will f/u in *** with carotid duplex -pt will call sooner should ***he have any issues. -continue statin/asa ***   Doreatha Massed, [facility] [phone]  Clinic MD:  Edilia Bo

## 2023-05-24 ENCOUNTER — Ambulatory Visit: Payer: Medicare Other

## 2023-05-24 ENCOUNTER — Ambulatory Visit (HOSPITAL_COMMUNITY): Payer: Medicare Other

## 2023-05-30 ENCOUNTER — Ambulatory Visit: Payer: Medicare Other | Admitting: Family Medicine

## 2023-06-15 ENCOUNTER — Ambulatory Visit (INDEPENDENT_AMBULATORY_CARE_PROVIDER_SITE_OTHER): Payer: Medicare Other | Admitting: Family Medicine

## 2023-06-15 ENCOUNTER — Ambulatory Visit: Payer: Medicare Other | Admitting: Cardiology

## 2023-06-15 VITALS — BP 119/69 | HR 63 | Temp 98.1°F | Ht 72.0 in | Wt 176.8 lb

## 2023-06-15 DIAGNOSIS — M545 Low back pain, unspecified: Secondary | ICD-10-CM

## 2023-06-15 DIAGNOSIS — G8929 Other chronic pain: Secondary | ICD-10-CM

## 2023-06-15 DIAGNOSIS — E1122 Type 2 diabetes mellitus with diabetic chronic kidney disease: Secondary | ICD-10-CM

## 2023-06-15 DIAGNOSIS — N1832 Chronic kidney disease, stage 3b: Secondary | ICD-10-CM

## 2023-06-15 DIAGNOSIS — E1169 Type 2 diabetes mellitus with other specified complication: Secondary | ICD-10-CM

## 2023-06-15 DIAGNOSIS — N3000 Acute cystitis without hematuria: Secondary | ICD-10-CM

## 2023-06-15 DIAGNOSIS — R52 Pain, unspecified: Secondary | ICD-10-CM

## 2023-06-15 DIAGNOSIS — E785 Hyperlipidemia, unspecified: Secondary | ICD-10-CM | POA: Diagnosis not present

## 2023-06-15 DIAGNOSIS — N1831 Chronic kidney disease, stage 3a: Secondary | ICD-10-CM | POA: Diagnosis not present

## 2023-06-15 MED ORDER — ROSUVASTATIN CALCIUM 40 MG PO TABS: 40.0000 mg | ORAL_TABLET | Freq: Every day | ORAL | 0 refills | Status: DC

## 2023-06-15 MED ORDER — HYDROCODONE-ACETAMINOPHEN 10-325 MG PO TABS: ORAL_TABLET | ORAL | 0 refills | Status: DC

## 2023-06-15 MED ORDER — CEFPROZIL 500 MG PO TABS: 500.0000 mg | ORAL_TABLET | Freq: Two times a day (BID) | ORAL | 0 refills | Status: DC

## 2023-06-15 MED ORDER — LOSARTAN POTASSIUM 25 MG PO TABS: 25.0000 mg | ORAL_TABLET | Freq: Every day | ORAL | 4 refills | Status: DC

## 2023-06-15 NOTE — Progress Notes (Signed)
Subjective:    Patient ID: Peter [name], male    DOB: May 13, 1941, 82 y.o.   MRN: [id number]  HPI Pt comes in today for a follow up. This patient was seen today for chronic pain  The medication list was reviewed and updated.   Location of Pain for which the patient has been treated with regarding narcotics: Lower back pain discomfort.  With some intermittent sciatica  Onset of this pain: Present for years   -Compliance with medication: Good compliance  - Number patient states they take daily: Some days none some days 1 some days to  -Reason for ongoing use of opioids cannot take NSAIDs, Tylenol does not do enough when the pain is severe  What other measures have been tried outside of opioids Tylenol, stretches, previous x-rays  In the ongoing specialists regarding this condition none  -when was the last dose patient took?  Yesterday  The patient was advised the importance of maintaining medication and not using illegal substances with these.  Here for refills and follow up  The patient was educated that we can provide 3 monthly scripts for their medication, it is their responsibility to follow the instructions.  Side effects or complications from medications: Denies side effects  Patient is aware that pain medications are meant to minimize the severity of the pain to allow their pain levels to improve to allow for better function. They are aware of that pain medications cannot totally remove their pain.  Due for UDT ( at least once per year) (pain management contract is also completed at the time of the UDT): None recent  Scale of 1 to 10 ( 1 is least 10 is most) Your pain level without the medicine: 8 Your pain level with medication 5  Scale 1 to 10 ( 1-helps very little, 10 helps very well) How well does your pain medication reduce your pain so you can function better through out the day? 8  Quality of the pain: Aching  Persistence of the pain:  Intermittent  Modifying factors: Worse with activity  Acute cystitis without hematuria  Type 2 diabetes mellitus with stage 3b chronic kidney disease, without long-term current use of insulin (HCC)  Chronic kidney disease, stage 3a (HCC)  Chronic bilateral low back pain without sciatica  Pain management  Hyperlipidemia associated with type 2 diabetes mellitus (HCC)  Past Medical History:  Diagnosis Date   AAA (abdominal aortic aneurysm) (HCC)    Arthritis    CAD (coronary artery disease)    a. s/p CABG in 1997 b. stent to SVG-PDA and PTCA of LAD in 2003 c. cath in 2015 showing patent SVG-OM1-OM2, SVG-RI, SVG-D1, and LIMA-LAD with occluded SVG-PDA and severe stenosis of mid-LAD at LIMA insertion with DES placed d. 04/2018: similar results to 2015 with patent LAD stent; occlusion of small caliber D1 stenosis with medical management recommended.    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   Myocardial infarction [facility]) 1997   stents x2 (2003/2007)  Outpatient Encounter Medications as of 06/15/2023  Medication Sig   cefPROZIL (CEFZIL) 500 MG tablet Take 1 tablet (500 mg total) by mouth 2 (two) times daily.   acetaminophen (TYLENOL) 500 MG tablet Take 1,000 mg by mouth every 6 (six) hours as needed for mild pain.   Aloe Vera Juice LIQD Take 8 oz by mouth daily.   aspirin EC 81 MG tablet Take 1 tablet (81 mg total) by mouth daily with breakfast.   Blood Glucose Monitoring Suppl (ACCU-CHEK GUIDE) w/Device KIT Use to  check blood sugars BID DX. E11.9   Cholecalciferol (VITAMIN D3) 250 MCG (10000 UT) TABS Take 10,000 Units by mouth 3 (three) times a week.   glucose blood (ACCU-CHEK GUIDE) test strip USE 1 STRIP TO CHECK GLUCOSE TWICE DAILY DUE TO FLUCTUATING SUGARS   HYDROcodone-acetaminophen (NORCO) 10-325 MG tablet 1 twice daily as needed pain   HYDROcodone-acetaminophen (NORCO) 10-325 MG tablet 1 twice daily as needed for severe pain caution drowsiness   HYDROcodone-acetaminophen (NORCO) 10-325 MG tablet 1 taken twice daily as needed for pain   losartan (COZAAR) 25 MG tablet Take 1 tablet (25 mg total) by mouth daily.   metoprolol succinate (TOPROL-XL) 25 MG 24 hr tablet Take 1 tablet (25 mg total) by mouth daily.   predniSONE (DELTASONE) 50 MG tablet    rosuvastatin (CRESTOR) 40 MG tablet Take 1 tablet (40 mg total) by mouth daily.   ticagrelor (BRILINTA) 60 MG TABS tablet Take 1 tablet (60 mg total) by mouth 2 (two) times daily.   vitamin E 180 MG (400 UNITS) capsule Take 400 Units by mouth See admin instructions. Twice month   [DISCONTINUED] HYDROcodone-acetaminophen (NORCO) 10-325 MG tablet 1 taken twice daily as needed for pain   [DISCONTINUED] HYDROcodone-acetaminophen (NORCO) 10-325 MG tablet 1 twice daily as needed pain   [DISCONTINUED] HYDROcodone-acetaminophen (NORCO) 10-325 MG tablet 1 twice daily as needed for severe pain caution drowsiness   [DISCONTINUED] losartan (COZAAR) 25 MG tablet Take 1 tablet (25 mg total) by mouth daily.   [DISCONTINUED] rosuvastatin (CRESTOR) 40 MG tablet Take 1 tablet (40 mg total) by mouth daily.   [DISCONTINUED] ticagrelor (BRILINTA) 60 MG TABS tablet Take 1 tablet (60 mg total) by mouth 2 (two) times daily.   No facility-administered encounter medications on file as of 06/15/2023.         Pt did at home UTI test and pt states it came back with a tace. Mild dysuria urinary frequency urine with nitrites and leukocytes  Review of Systems     Objective:    Physical Exam  General-in no acute distress Eyes-no discharge Lungs-respiratory rate normal, CTA CV-no murmurs,RRR Extremities skin warm dry no edema Neuro grossly normal Behavior normal, alert       Assessment & Plan:   1. Acute cystitis without hematuria Patient with dysuria and urinary frequency antibiotics prescribed warning signs discussed  2. Type 2 diabetes mellitus with stage 3b chronic kidney disease, without long-term current use of insulin (HCC) A1c fair control continue current measures  3. Chronic kidney disease, stage 3a (HCC) On losartan continue current measures  4. Chronic bilateral low back pain without sciatica Check PDMP Pain prescriptions prescribed  5. Pain management Patient uses a lot less often denies any drowsiness 3 scripts given  The patient was seen in followup for chronic pain. A review over at their current pain status was discussed. Drug registry was checked. Prescriptions were given.  Regular follow-up recommended. Discussion was held regarding the importance of compliance  with medication as well as pain medication contract.  Patient was informed that medication may cause drowsiness and should not be combined  with other medications/alcohol or street drugs. If the patient feels medication is causing altered alertness then do not drive or operate dangerous equipment.  Should be noted that the patient appears to be meeting appropriate use of opioids and response.  Evidenced by improved function and decent pain control without significant side effects and no evidence of overt aberrancy issues.  Upon discussion with the patient today they understand that opioid therapy is optional and they feel that the pain has been refractory to reasonable conservative measures and is significant and affecting quality of life enough to warrant ongoing therapy and wishes to continue opioids.  Refills were provided. He was encouraged to minimize pain medicine use  keep at or below where it is  6. Hyperlipidemia associated with type 2 diabetes mellitus (HCC) Taking his statin watching diet  Check labs before next visit

## 2023-06-15 NOTE — Progress Notes (Deleted)
Clinical Summary Mr. Ragone is a 82 y.o.male  seen today for follow up of the following medical problems.      1. CAD/ICM/Chronic systolic HF - hx of prior CABG 1997 at [facility]  - stent to SVG-PDA and PTCA of the LAD in 05/2002. Normal LVEF at that time - 2008 heart cath [facility] had repeat stenting, he is unsure of the details - cath 2015 showing patent SVG-OM1-OM2, SVG-RI, SVG-D1, and LIMA-LAD with occluded SVG-PDA and severe stenosis of mid-LAD at LIMA insertion with DES placed),    - 04/2018 cath showed severe 3-vessel CAD with patent LIMA-LAD, patent stent along mid-LAD, patent SVG-OM1-OM2, and patent SVG-RI with occlusion of D1 and known occlusion of SVG-PDA. The diagonal occlusion was thought to be old, therefore continued medical therapy was recommended.  - 04/2018 echo LVEF 30-35%.  01/2020 LVEF 40-45% 02/2022 echo: LVEF 50-55%   02/2020 cath as reported below, DES to SVG-diag. Severe residual small vessel disease.        - no recent chest pains, occasional SOB at times. No recent edema.  - compliant with meds       2. HTN - compliant with meds   3. HL 11/2020 TC 133 TG 153 HDL 45 LDL 62 04/2021 TC 116 TG 83 HDL 44 LDL 56 -06/2022 TC 78 TG 50 HDL 38 LDL 30   4. AAA - followed by vascular - 6 cm by CT, elected for no intervention.    5. Recent covid infection Jan 2024   6. History of bilateral carotid artery stenting - followed by vascular Past Medical History:  Diagnosis Date   AAA (abdominal aortic aneurysm) (HCC)    Arthritis    CAD (coronary artery disease)    a. s/p CABG in 1997 b. stent to SVG-PDA and PTCA of LAD in 2003 c. cath in 2015 showing patent SVG-OM1-OM2, SVG-RI, SVG-D1, and LIMA-LAD with occluded SVG-PDA and severe stenosis of mid-LAD at LIMA insertion with DES placed d. 04/2018: similar results to 2015 with patent LAD stent; occlusion of small caliber D1 stenosis with medical management recommended.    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   Myocardial infarction [facility]) 1997   stents x2 (2003/2007)     Allergies  Allergen Reactions   Dye Fdc Red [Red Dye] Hives    hives   Ivp Dye [Iodinated Contrast Media] Hives   Plavix [Clopidogrel] Hives    Unsure if dye or Plavix   Keflex [Cephalexin] Other (See Comments)    hoarsness     Current Outpatient Medications  Medication Sig Dispense Refill   acetaminophen (TYLENOL) 500 MG tablet Take 1,000 mg by mouth every 6 (six) hours as needed for mild pain.     Aloe Vera Juice LIQD Take 8 oz by mouth daily.     aspirin EC 81 MG tablet Take 1 tablet (81 mg total) by mouth daily with breakfast. 90 tablet 3   Blood Glucose Monitoring Suppl (  ACCU-CHEK GUIDE) w/Device KIT Use to check blood sugars BID DX. E11.9 1 kit 0   Cholecalciferol (VITAMIN D3) 250 MCG (10000 UT) TABS Take 10,000 Units by mouth 3 (three) times a week.     glucose blood (ACCU-CHEK GUIDE) test strip USE 1 STRIP TO CHECK GLUCOSE TWICE DAILY DUE TO FLUCTUATING SUGARS 100 each 1   HYDROcodone-acetaminophen (NORCO) 10-325 MG tablet 1 taken twice daily as needed for pain 60 tablet 0   HYDROcodone-acetaminophen (NORCO) 10-325 MG tablet 1 twice daily as needed pain 60 tablet 0   HYDROcodone-acetaminophen (NORCO) 10-325 MG tablet 1 twice daily as needed for severe pain caution drowsiness 60 tablet 0   losartan (COZAAR) 25 MG tablet Take 1 tablet (25 mg total) by mouth daily. 30 tablet 4   metoprolol succinate (TOPROL-XL) 25 MG 24 hr tablet Take 1  tablet (25 mg total) by mouth daily. 90 tablet 3   predniSONE (DELTASONE) 50 MG tablet      rosuvastatin (CRESTOR) 40 MG tablet Take 1 tablet (40 mg total) by mouth daily. 90 tablet 0   ticagrelor (BRILINTA) 60 MG TABS tablet Take 1 tablet (60 mg total) by mouth 2 (two) times daily. 60 tablet 11   ticagrelor (BRILINTA) 60 MG TABS tablet Take 1 tablet (60 mg total) by mouth 2 (two) times daily. 28 tablet 0   vitamin E 180 MG (400 UNITS) capsule Take 400 Units by mouth See admin instructions. Twice month     No current facility-administered medications for this visit.     Past Surgical History:  Procedure Laterality Date   CORONARY ANGIOPLASTY  2003/2007   2 stents   CORONARY ANGIOPLASTY WITH STENT PLACEMENT  08/14/2014   DES to LIMA-LAD insertion   CORONARY ARTERY BYPASS GRAFT  12/06/1995   7 vessels   CORONARY STENT INTERVENTION N/A 02/24/2020   Procedure: CORONARY STENT INTERVENTION;  Surgeon: Tonny Bollman, MD;  Location: [facility] INVASIVE CV LAB;  Service: Cardiovascular;  Laterality: N/A;   CORONARY STENT PLACEMENT  08/15/2014   MID LAD  DES  by Dr Normajean Glasgow ANGIOGRAPHY N/A 02/24/2020   Procedure: Rosalie Doctor ANGIOGRAPHY;  Surgeon: Tonny Bollman, MD;  Location: [facility] INVASIVE CV LAB;  Service: Cardiovascular;  Laterality: N/A;   CYSTECTOMY  12/06/2003   top of head-Jenkins   EAR CYST EXCISION  07/09/2012   Procedure: CYST REMOVAL;  Surgeon: Dalia Heading, MD;  Location: [facility] ORS;  Service: General;  Laterality: N/A;   EYE SURGERY Right    cataract removal and then another surgery after   HERNIA REPAIR     1980's inguinal Left side   LEFT HEART CATH AND CORS/GRAFTS ANGIOGRAPHY N/A 04/16/2018   Procedure: LEFT HEART CATH AND CORS/GRAFTS ANGIOGRAPHY;  Surgeon: Swaziland, Peter M, MD;  Location: [facility] INVASIVE CV LAB;  Service: Cardiovascular;  Laterality: N/A;   LEFT HEART CATHETERIZATION WITH CORONARY/GRAFT ANGIOGRAM N/A 08/14/2014   Procedure: LEFT HEART CATHETERIZATION  WITH Isabel Caprice;  Surgeon: Micheline Chapman, MD;  Location: [facility] CATH LAB;  Service: Cardiovascular;  Laterality: N/A;   TEE WITHOUT CARDIOVERSION N/A 02/14/2022   Procedure: TRANSESOPHAGEAL ECHOCARDIOGRAM (TEE);  Surgeon: Meriam Sprague, MD;  Location: [facility] ORS;  Service: Cardiovascular;  Laterality: N/A;   TRANSCAROTID ARTERY REVASCULARIZATION  Left 03/25/2022   Procedure: LEFT TRANCAROTID ARTERY REVASCULARIZATION USING 10mm X 40mm ENROUTE STENT SYSTEM;  Surgeon: Maeola Harman, MD;  Location: [facility] OR;  Service: Vascular;  Laterality: Left;   TRANSCAROTID ARTERY REVASCULARIZATION  Right 06/10/2022  Procedure: Right Transcarotid Artery Revascularization;  Surgeon: Maeola Harman, MD;  Location: [facility] OR;  Service: Vascular;  Laterality: Right;   ULTRASOUND GUIDANCE FOR VASCULAR ACCESS N/A 03/25/2022   Procedure: ULTRASOUND GUIDANCE FOR VASCULAR ACCESS;  Surgeon: Maeola Harman, MD;  Location: [facility] OR;  Service: Vascular;  Laterality: N/A;   ULTRASOUND GUIDANCE FOR VASCULAR ACCESS Left 06/10/2022   Procedure: ULTRASOUND GUIDANCE FOR VASCULAR ACCESS, LEFT FEMORAL VEIN;  Surgeon: Maeola Harman, MD;  Location: [facility] OR;  Service: Vascular;  Laterality: Left;     Allergies  Allergen Reactions   Dye Fdc Red [Red Dye] Hives    hives   Ivp Dye [Iodinated Contrast Media] Hives   Plavix [Clopidogrel] Hives    Unsure if dye or Plavix   Keflex [Cephalexin] Other (See Comments)    hoarsness      Family History  Problem Relation Age of Onset   Other Father        deceased after ?TCS or barium enema, age 51s   Hypertension Father    Heart attack Mother    Colon cancer Neg Hx    Liver disease Neg Hx      Social History Mr. Coole reports that he quit smoking about 22 years ago. His smoking use included cigarettes. He started smoking about 62 years ago. He has a 120 pack-year smoking history. He has never used smokeless tobacco. Mr. Burkett  reports that he does not currently use alcohol.   Review of Systems CONSTITUTIONAL: No weight loss, fever, chills, weakness or fatigue.  HEENT: Eyes: No visual loss, blurred vision, double vision or yellow sclerae.No hearing loss, sneezing, congestion, runny nose or sore throat.  SKIN: No rash or itching.  CARDIOVASCULAR:  RESPIRATORY: No shortness of breath, cough or sputum.  GASTROINTESTINAL: No anorexia, nausea, vomiting or diarrhea. No abdominal pain or blood.  GENITOURINARY: No burning on urination, no polyuria NEUROLOGICAL: No headache, dizziness, syncope, paralysis, ataxia, numbness or tingling in the extremities. No change in bowel or bladder control.  MUSCULOSKELETAL: No muscle, back pain, joint pain or stiffness.  LYMPHATICS: No enlarged nodes. No history of splenectomy.  PSYCHIATRIC: No history of depression or anxiety.  ENDOCRINOLOGIC: No reports of sweating, cold or heat intolerance. No polyuria or polydipsia.  Marland Kitchen   Physical Examination There were no vitals filed for this visit. There were no vitals filed for this visit.  Gen: resting comfortably, no acute distress HEENT: no scleral icterus, pupils equal round and reactive, no palptable cervical adenopathy,  CV Resp: Clear to auscultation bilaterally GI: abdomen is soft, non-tender, non-distended, normal bowel sounds, no hepatosplenomegaly MSK: extremities are warm, no edema.  Skin: warm, no rash Neuro:  no focal deficits Psych: appropriate affect   Diagnostic Studies 02/2020 echo IMPRESSIONS     1. Left ventricular ejection fraction, by estimation, is 40 to 45%. The  left ventricle has mildly decreased function. The left ventricle  demonstrates regional wall motion abnormalities (see scoring  diagram/findings for description). There is mild  concentric left ventricular hypertrophy. Left ventricular diastolic  parameters are consistent with Grade I diastolic dysfunction (impaired  relaxation).   2. Right  ventricular systolic function is normal. The right ventricular  size is normal. Tricuspid regurgitation signal is inadequate for assessing  PA pressure.   3. The mitral valve is grossly normal. No evidence of mitral valve  regurgitation. No evidence of mitral stenosis.   4. The aortic valve is tricuspid. Aortic valve regurgitation is trivial.  Mild aortic valve sclerosis  is present, with no evidence of aortic valve  stenosis.   5. Aortic dilatation noted. 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   6. The inferior vena cava is normal in size with greater than 50%  respiratory variability, suggesting right atrial pressure of 3 mmHg.    02/2020 cath 1. 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 2. S/P CABG with continued patency of the LIMA-LAD, sequential SVG-OM1 and OM2, and severe stenosis of the SVG-diagonal 3. Chronic occlusion of the SVG-PDA/PLA branches with left-to-right collaterals present 4. Successful SVG-PCI with stenting of the proximal and distal body of the SVG-diagonal, with transient no-reflow and severe small vessel distal disease.    Recommend aggressive medical therapy, minimum 12 months of ASA/ticagrelor consider long-term if tolerated          Assessment and Plan   1. CAD/ICM/Chronic systolic heart - given extensive CAD history and plavix allergy have elected for long term brillinta at lower dose of 60mg  bid  - no symptoms. - medical therapy limited by renal dysfunction, most recent LVEF had improved to low normal - continue current meds   2. Hyperlipidemia - at goal, continue current meds   3. HTN -at goal, continue current meds     Antoine Poche, M.D.

## 2023-07-18 NOTE — Progress Notes (Unsigned)
Office Note     CC:  follow up Requesting Provider:  Babs Sciara, MD  HPI: Peter [name] is a 81 y.o. 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 He has history of bilateral transcarotid artery stenting in 2023 by Dr. Randie Heinz. He denies any amaurosis or other visual changes, slurred speech, facial drooping, unilateral upper or lower extremity weakness or numbness.  He also has history of large 6 cm AAA.He was referred by Dr. Randie Heinz to [facility] for management of his AAA at time of his last visit in September 2023. However, he reports that he elected to not have any repair done, but he does explain that there was misunderstanding as far as the referral and he thought he was being sent only for surgery without any prior evaluation. Today he denies any new back or abdominal pain.   He is medically managed on aspirin and Brilinta.   He continues to be the primary caregiver for his wife. He says she has severe dementia and he has been caring for her for 9 years.  He is medically managed on BB and ARB for HTN. He is on statin for HLD. He is a diabetic. He is on Brillinta for his CAD. He is on chronic pain management for back pain. He is former smoker, quit in 2001.  Past Medical History:  Diagnosis Date   AAA (abdominal aortic aneurysm) (HCC)    Arthritis    CAD (coronary artery disease)    a. s/p CABG in 1997 b. stent to SVG-PDA and PTCA of LAD in 2003 c. cath in 2015 showing patent SVG-OM1-OM2, SVG-RI, SVG-D1, and LIMA-LAD with occluded SVG-PDA and severe stenosis of mid-LAD at LIMA insertion with DES placed d. 04/2018: similar results to 2015 with patent LAD stent; occlusion of small caliber D1 stenosis with medical management recommended.    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   Myocardial infarction [facility]) 1997   stents x2 (2003/2007)    Past Surgical History:  Procedure Laterality Date   CORONARY ANGIOPLASTY  2003/2007   2 stents   CORONARY ANGIOPLASTY WITH STENT PLACEMENT  08/14/2014   DES to LIMA-LAD insertion   CORONARY ARTERY BYPASS GRAFT  12/06/1995   7 vessels   CORONARY STENT INTERVENTION N/A 02/24/2020   Procedure: CORONARY STENT INTERVENTION;  Surgeon: Tonny Bollman, MD;  Location: [facility] INVASIVE CV LAB;  Service: Cardiovascular;  Laterality: N/A;   CORONARY STENT PLACEMENT  08/15/2014   MID LAD  DES  by Dr Normajean Glasgow ANGIOGRAPHY N/A 02/24/2020   Procedure: Rosalie Doctor ANGIOGRAPHY;  Surgeon: Tonny Bollman, MD;  Location: [facility] INVASIVE CV LAB;  Service: Cardiovascular;  Laterality: N/A;   CYSTECTOMY  12/06/2003   top of head-Jenkins   EAR CYST EXCISION  07/09/2012   Procedure: CYST REMOVAL;  Surgeon: Dalia Heading, MD;  Location: [facility] ORS;  Service: General;  Laterality: N/A;   EYE SURGERY Right    cataract  removal and then another surgery after   HERNIA REPAIR     1980's inguinal Left side   LEFT HEART CATH AND CORS/GRAFTS ANGIOGRAPHY N/A 04/16/2018   Procedure: LEFT HEART CATH AND CORS/GRAFTS ANGIOGRAPHY;  Surgeon: Swaziland, Peter M, MD;  Location: [facility] INVASIVE CV LAB;  Service: Cardiovascular;  Laterality: N/A;   LEFT HEART CATHETERIZATION WITH CORONARY/GRAFT ANGIOGRAM N/A 08/14/2014   Procedure: LEFT HEART CATHETERIZATION WITH Isabel Caprice;  Surgeon: Micheline Chapman, MD;  Location: [facility] CATH LAB;  Service: Cardiovascular;  Laterality: N/A;   TEE WITHOUT CARDIOVERSION N/A 02/14/2022   Procedure: TRANSESOPHAGEAL  ECHOCARDIOGRAM (TEE);  Surgeon: Meriam Sprague, MD;  Location: [facility] ORS;  Service: Cardiovascular;  Laterality: N/A;   TRANSCAROTID ARTERY REVASCULARIZATION  Left 03/25/2022   Procedure: LEFT TRANCAROTID ARTERY REVASCULARIZATION USING 10mm X 40mm ENROUTE STENT SYSTEM;  Surgeon: Maeola Harman, MD;  Location: [facility] OR;  Service: Vascular;  Laterality: Left;   TRANSCAROTID ARTERY REVASCULARIZATION  Right 06/10/2022   Procedure: Right Transcarotid Artery Revascularization;  Surgeon: Maeola Harman, MD;  Location: [facility] OR;  Service: Vascular;  Laterality: Right;   ULTRASOUND GUIDANCE FOR VASCULAR ACCESS N/A 03/25/2022   Procedure: ULTRASOUND GUIDANCE FOR VASCULAR ACCESS;  Surgeon: Maeola Harman, MD;  Location: [facility] OR;  Service: Vascular;  Laterality: N/A;   ULTRASOUND GUIDANCE FOR VASCULAR ACCESS Left 06/10/2022   Procedure: ULTRASOUND GUIDANCE FOR VASCULAR ACCESS, LEFT FEMORAL VEIN;  Surgeon: Maeola Harman, MD;  Location: [facility] OR;  Service: Vascular;  Laterality: Left;    Social History   Socioeconomic History   Marital status: Married    Spouse name: Malachi Bonds   Number of children: 2   Years of education: Not on file   Highest education level: Not on file  Occupational History   Not on file  Tobacco Use   Smoking status: Former    Current packs/day: 0.00    Average packs/day: 3.0 packs/day for 40.0 years (120.0 ttl pk-yrs)    Types: Cigarettes    Start date: 07/02/1960    Quit date: 07/02/2000    Years since quitting: 23.0   Smokeless tobacco: Never  Vaping Use   Vaping status: Never Used  Substance and Sexual Activity   Alcohol use: Not Currently   Drug use: No   Sexual activity: Not Currently    Birth control/protection: None  Other Topics Concern   Not on file  Social History Narrative   Married x 62 years. Cares for wife at home. Family assists.   Social Determinants of Health   Financial Resource Strain: Low Risk  (08/23/2022)   Overall  Financial Resource Strain (CARDIA)    Difficulty of Paying Living Expenses: Not hard at all  Food Insecurity: No Food Insecurity (08/23/2022)   Hunger Vital Sign    Worried About Running Out of Food in the Last Year: Never true    Ran Out of Food in the Last Year: Never true  Transportation Needs: No Transportation Needs (08/23/2022)   PRAPARE - Administrator, Civil Service (Medical): No    Lack of Transportation (Non-Medical): No  Physical Activity: Sufficiently Active (08/23/2022)   Exercise Vital Sign    Days of Exercise per Week: 5 days    Minutes of Exercise per Session: 60 min  Stress: Stress Concern Present (08/23/2022)   [employer] of Occupational Health - Occupational Stress Questionnaire    Feeling of Stress : To some extent  Social Connections: Moderately Isolated (08/23/2022)   Social Connection and  Isolation Panel [NHANES]    Frequency of Communication with Friends and Family: Three times a week    Frequency of Social Gatherings with Friends and Family: Twice a week    Attends Religious Services: Never    Database administrator or Organizations: No    Attends Banker Meetings: Never    Marital Status: Married  Catering manager Violence: Not At Risk (08/23/2022)   Humiliation, Afraid, Rape, and Kick questionnaire    Fear of Current or Ex-Partner: No    Emotionally Abused: No    Physically Abused: No    Sexually Abused: No    Family History  Problem Relation Age of Onset   Other Father        deceased after ?TCS or barium enema, age 45s   Hypertension Father    Heart attack Mother    Colon cancer Neg Hx    Liver disease Neg Hx     Current Outpatient Medications  Medication Sig Dispense Refill   acetaminophen (TYLENOL) 500 MG tablet Take 1,000 mg by mouth every 6 (six) hours as needed for mild pain.     Aloe Vera Juice LIQD Take 8 oz by mouth daily.     aspirin EC 81 MG tablet Take 1 tablet (81 mg total) by mouth daily with breakfast.  90 tablet 3   Blood Glucose Monitoring Suppl (ACCU-CHEK GUIDE) w/Device KIT Use to check blood sugars BID DX. E11.9 1 kit 0   cefPROZIL (CEFZIL) 500 MG tablet Take 1 tablet (500 mg total) by mouth 2 (two) times daily. 10 tablet 0   Cholecalciferol (VITAMIN D3) 250 MCG (10000 UT) TABS Take 10,000 Units by mouth 3 (three) times a week.     glucose blood (ACCU-CHEK GUIDE) test strip USE 1 STRIP TO CHECK GLUCOSE TWICE DAILY DUE TO FLUCTUATING SUGARS 100 each 1   HYDROcodone-acetaminophen (NORCO) 10-325 MG tablet 1 twice daily as needed pain 60 tablet 0   HYDROcodone-acetaminophen (NORCO) 10-325 MG tablet 1 twice daily as needed for severe pain caution drowsiness 60 tablet 0   HYDROcodone-acetaminophen (NORCO) 10-325 MG tablet 1 taken twice daily as needed for pain 60 tablet 0   losartan (COZAAR) 25 MG tablet Take 1 tablet (25 mg total) by mouth daily. 30 tablet 4   metoprolol succinate (TOPROL-XL) 25 MG 24 hr tablet Take 1 tablet (25 mg total) by mouth daily. 90 tablet 3   rosuvastatin (CRESTOR) 40 MG tablet Take 1 tablet (40 mg total) by mouth daily. 90 tablet 0   ticagrelor (BRILINTA) 60 MG TABS tablet Take 1 tablet (60 mg total) by mouth 2 (two) times daily. 60 tablet 11   vitamin E 180 MG (400 UNITS) capsule Take 400 Units by mouth See admin instructions. Twice month     predniSONE (DELTASONE) 50 MG tablet  (Patient not taking: Reported on 07/19/2023)     No current facility-administered medications for this visit.    Allergies  Allergen Reactions   Dye Fdc Red [Red Dye #40 (Allura Red)] Hives    hives   Ivp Dye [Iodinated Contrast Media] Hives   Plavix [Clopidogrel] Hives    Unsure if dye or Plavix   Keflex [Cephalexin] Other (See Comments)    hoarsness     REVIEW OF SYSTEMS:  [X]  denotes positive finding, [ ]  denotes negative finding Cardiac  Comments:  Chest pain or chest pressure:    Shortness of breath upon exertion:    Short of breath when lying flat:  Irregular heart  rhythm:        Vascular    Pain in calf, thigh, or hip brought on by ambulation:    Pain in feet at night that wakes you up from your sleep:     Blood clot in your veins:    Leg swelling:         Pulmonary    Oxygen at home:    Productive cough:     Wheezing:         Neurologic    Sudden weakness in arms or legs:     Sudden numbness in arms or legs:     Sudden onset of difficulty speaking or slurred speech:    Temporary loss of vision in one eye:     Problems with dizziness:         Gastrointestinal    Blood in stool:     Vomited blood:         Genitourinary    Burning when urinating:     Blood in urine:        Psychiatric    Major depression:         Hematologic    Bleeding problems:    Problems with blood clotting too easily:        Skin    Rashes or ulcers:        Constitutional    Fever or chills:      PHYSICAL EXAMINATION:  Vitals:   07/19/23 1440 07/19/23 1441  BP: (!) 184/85 (!) 171/79  Pulse: 63   Resp: 18   Temp: 98.1 F (36.7 C)   TempSrc: Temporal   SpO2: 96%   Weight: 175 lb 9.6 oz (79.7 kg)   Height: 6' (1.829 m)     General:  WDWN in NAD; vital signs documented above Gait: Normal, uses cane HENT: WNL, normocephalic Pulmonary: normal non-labored breathing Cardiac: regular HR Abdomen: soft, NT, no masses Vascular Exam/Pulses: 2+ femoral, 2+ AT pulses, feet warm and well perfused Extremities: without ischemic changes, without Gangrene , without cellulitis; without open wounds Musculoskeletal: no muscle wasting or atrophy  Neurologic: A&O X 3  Psychiatric:  The pt has Normal affect.   Non-Invasive Vascular Imaging:   Summary:  Right Carotid: Velocities in the right ICA are consistent with a 1-39% stenosis. Non-hemodynamically significant plaque <50% noted in the CCA. Patent ICA stent.   Left Carotid: Velocities in the left ICA are consistent with a 1-39% stenosis. Non-hemodynamically significant plaque <50% noted in the CCA. Patent  ICA stent.   Vertebrals:  Bilateral vertebral arteries demonstrate antegrade flow.  Subclavians: Normal flow hemodynamics were seen in bilateral subclavian arteries.   ASSESSMENT/PLAN:: 82 y.o. male here for follow up for carotid artery stenosis. He has history of bilateral transcarotid artery stenting in 2023 by Dr. Randie Heinz. He also has history of AAA. He has no abdominal pain or back pain. He does not have any TIA or stroke like symptoms. Duplex today shows 1-39% ICA stenosis bilaterally. Normal flow in the vertebral and subclavian arteries bilaterally - Continue aspirin and Brillinta - He understands should he have any shearing abdominal pain or back pain he needs to seek immediate medical attention - He will follow up in 1 year with repeat carotid duplex - Patient seen with Dr. Randie Heinz who helps to explain referral and necessity with patient as well as his risk of aneurysmal rupture. Options given to have nothing done, have CTA with review and follow up with Dr. Randie Heinz  or new referral to [facility]. Patient has elected to have CTA repeated by Dr. Randie Heinz and follow up with him for further evaluation - Will arrange CTA Abdomen/ pelvis (AAA eval) and he will follow up via telephone visit with Dr. Randie Heinz to review results and arrange further management   Graceann Congress, PA-C [facility] [phone]  Clinic MD:   Randie Heinz

## 2023-07-19 ENCOUNTER — Ambulatory Visit: Payer: Medicare Other | Admitting: Physician Assistant

## 2023-07-19 ENCOUNTER — Ambulatory Visit (HOSPITAL_COMMUNITY)
Admission: RE | Admit: 2023-07-19 | Discharge: 2023-07-19 | Disposition: A | Discharge status: Home / Self Care | Payer: Medicare Other | Source: Ambulatory Visit | Attending: Vascular Surgery | Admitting: Vascular Surgery

## 2023-07-19 VITALS — BP 171/79 | HR 63 | Temp 98.1°F | Resp 18 | Ht 72.0 in | Wt 175.6 lb

## 2023-07-19 DIAGNOSIS — I7141 Pararenal abdominal aortic aneurysm, without rupture: Secondary | ICD-10-CM | POA: Diagnosis not present

## 2023-07-19 DIAGNOSIS — I6523 Occlusion and stenosis of bilateral carotid arteries: Secondary | ICD-10-CM

## 2023-07-20 ENCOUNTER — Encounter: Payer: Self-pay | Admitting: Physician Assistant

## 2023-07-21 ENCOUNTER — Other Ambulatory Visit: Payer: Self-pay

## 2023-07-21 DIAGNOSIS — I6523 Occlusion and stenosis of bilateral carotid arteries: Secondary | ICD-10-CM

## 2023-08-04 ENCOUNTER — Ambulatory Visit: Payer: Self-pay | Admitting: *Deleted

## 2023-08-24 ENCOUNTER — Other Ambulatory Visit: Payer: Self-pay

## 2023-08-24 DIAGNOSIS — I7142 Juxtarenal abdominal aortic aneurysm, without rupture: Secondary | ICD-10-CM

## 2023-08-24 MED ORDER — PREDNISONE 50 MG PO TABS: ORAL_TABLET | ORAL | 0 refills | Status: DC

## 2023-08-24 MED ORDER — DIPHENHYDRAMINE HCL 50 MG PO CAPS: ORAL_CAPSULE | ORAL | 0 refills | Status: AC

## 2023-09-13 ENCOUNTER — Encounter (HOSPITAL_COMMUNITY): Payer: Self-pay

## 2023-09-13 ENCOUNTER — Ambulatory Visit (HOSPITAL_COMMUNITY)
Admission: RE | Admit: 2023-09-13 | Discharge: 2023-09-13 | Disposition: A | Discharge status: Home / Self Care | Payer: Medicare Other | Source: Ambulatory Visit | Attending: Vascular Surgery | Admitting: Vascular Surgery

## 2023-09-13 DIAGNOSIS — I7142 Juxtarenal abdominal aortic aneurysm, without rupture: Secondary | ICD-10-CM

## 2023-09-19 ENCOUNTER — Ambulatory Visit (HOSPITAL_COMMUNITY)
Admission: RE | Admit: 2023-09-19 | Discharge: 2023-09-19 | Disposition: A | Discharge status: Home / Self Care | Payer: Medicare Other | Source: Ambulatory Visit | Attending: Vascular Surgery | Admitting: Vascular Surgery

## 2023-09-19 DIAGNOSIS — Q272 Other congenital malformations of renal artery: Secondary | ICD-10-CM | POA: Diagnosis not present

## 2023-09-19 DIAGNOSIS — I7142 Juxtarenal abdominal aortic aneurysm, without rupture: Secondary | ICD-10-CM | POA: Diagnosis not present

## 2023-09-19 DIAGNOSIS — I701 Atherosclerosis of renal artery: Secondary | ICD-10-CM | POA: Diagnosis not present

## 2023-09-19 DIAGNOSIS — I7143 Infrarenal abdominal aortic aneurysm, without rupture: Secondary | ICD-10-CM | POA: Diagnosis not present

## 2023-09-19 DIAGNOSIS — I723 Aneurysm of iliac artery: Secondary | ICD-10-CM | POA: Diagnosis not present

## 2023-09-19 MED ORDER — IOHEXOL 350 MG/ML SOLN
80.0000 mL | Freq: Once | INTRAVENOUS | Status: AC | PRN
  Administered 2023-09-19: 75 mL via INTRAVENOUS

## 2023-09-20 ENCOUNTER — Ambulatory Visit: Payer: Medicare Other | Admitting: Vascular Surgery

## 2023-09-22 ENCOUNTER — Ambulatory Visit (INDEPENDENT_AMBULATORY_CARE_PROVIDER_SITE_OTHER): Payer: Medicare Other | Admitting: Family Medicine

## 2023-09-22 VITALS — BP 136/84 | HR 70 | Temp 97.6°F | Wt 178.2 lb

## 2023-09-22 DIAGNOSIS — E1169 Type 2 diabetes mellitus with other specified complication: Secondary | ICD-10-CM

## 2023-09-22 DIAGNOSIS — E1122 Type 2 diabetes mellitus with diabetic chronic kidney disease: Secondary | ICD-10-CM | POA: Diagnosis not present

## 2023-09-22 DIAGNOSIS — N1831 Chronic kidney disease, stage 3a: Secondary | ICD-10-CM

## 2023-09-22 DIAGNOSIS — E785 Hyperlipidemia, unspecified: Secondary | ICD-10-CM | POA: Diagnosis not present

## 2023-09-22 DIAGNOSIS — N1832 Chronic kidney disease, stage 3b: Secondary | ICD-10-CM

## 2023-09-22 DIAGNOSIS — R058 Other specified cough: Secondary | ICD-10-CM

## 2023-09-22 DIAGNOSIS — R52 Pain, unspecified: Secondary | ICD-10-CM | POA: Diagnosis not present

## 2023-09-22 MED ORDER — ROSUVASTATIN CALCIUM 40 MG PO TABS: 40.0000 mg | ORAL_TABLET | Freq: Every day | ORAL | 2 refills | Status: DC

## 2023-09-22 MED ORDER — HYDROCODONE-ACETAMINOPHEN 10-325 MG PO TABS: ORAL_TABLET | ORAL | 0 refills | Status: DC

## 2023-09-22 MED ORDER — LOSARTAN POTASSIUM 25 MG PO TABS: 25.0000 mg | ORAL_TABLET | Freq: Every day | ORAL | 9 refills | Status: DC

## 2023-09-23 NOTE — Progress Notes (Signed)
Subjective:    Patient ID: Peter [name], male    DOB: [birth year], 82 y.o.   MRN: [id number]  HPI Other cough  Type 2 diabetes mellitus with stage 3b chronic kidney disease, without long-term current use of insulin (HCC) - Plan: Hemoglobin A1c  Chronic kidney disease, stage 3a (HCC) - Plan: Basic Metabolic Panel  Pain management  Hyperlipidemia associated with type 2 diabetes mellitus (HCC)  This patient was seen today for chronic pain  The medication list was reviewed and updated.   Location of Pain for which the patient has been treated with regarding narcotics: Lumbar area  Onset of this pain: Present for years   -Compliance with medication: Good compliance  - Number patient states they take daily: Some days he takes none occasionally 1 rarely 2  -Reason for ongoing use of opioids Tylenol does not help enough not a good candidate for NSAIDs  What other measures have been tried outside of opioids Tylenol previously NSAIDs  In the ongoing specialists regarding this condition no back specialist  -when was the last dose patient took?  Earlier this week  The patient was advised the importance of maintaining medication and not using illegal substances with these.  Here for refills and follow up  The patient was educated that we can provide 3 monthly scripts for their medication, it is their responsibility to follow the instructions.  Side effects or complications from medications: Denies side effects  Patient is aware that pain medications are meant to minimize the severity of the pain to allow their pain levels to improve to allow for better function. They are aware of that pain medications cannot totally remove their pain.   Scale of 1 to 10 ( 1 is least 10 is most) Your pain level without the medicine: 7 Your pain level with medication 4  Scale 1 to 10 ( 1-helps very little, 10 helps very well) How well does your pain medication reduce your pain so you can  function better through out the day? 7  Quality of the pain: Throbbing aching  Persistence of the pain: Intermittent  Modifying factors: Worse with activity        Review of Systems     Objective:   Physical Exam   General-in no acute distress Eyes-no discharge Lungs-respiratory rate normal, CTA CV-no murmurs,RRR Extremities skin warm dry no edema Neuro grossly normal Behavior normal, alert      Assessment & Plan:   1. Other cough Has occasional clearing of her throat cough denies shortness of breath no intervention necessary currently  2. Type 2 diabetes mellitus with stage 3b chronic kidney disease, without long-term current use of insulin (HCC) Continue current measures watch diet check A1c goal is A1c around 7 - Hemoglobin A1c  3. Chronic kidney disease, stage 3a (HCC) Avoid NSAIDs check metabolic 7 - Basic Metabolic Panel  4. Pain management The patient was seen in followup for chronic pain. A review over at their current pain status was discussed. Drug registry was checked. Prescriptions were given.  Regular follow-up recommended. Discussion was held regarding the importance of compliance with medication as well as pain medication contract.  Patient was informed that medication may cause drowsiness and should not be combined  with other medications/alcohol or street drugs. If the patient feels medication is causing altered alertness then do not drive or operate dangerous equipment.  Should be noted that the patient appears to be meeting appropriate use of opioids and response.  Evidenced by  improved function and decent pain control without significant side effects and no evidence of overt aberrancy issues.  Upon discussion with the patient today they understand that opioid therapy is optional and they feel that the pain has been refractory to reasonable conservative measures and is significant and affecting quality of life enough to warrant ongoing therapy and  wishes to continue opioids.  Refills were provided.  [facility] medical Board guidelines regarding the pain medicine has been reviewed.  CDC guidelines most updated 2022 has been reviewed by the prescriber.  PDMP is checked on a regular basis yearly urine drug screen and pain management contract   5. Hyperlipidemia associated with type 2 diabetes mellitus (HCC) Patient takes rosuvastatin

## 2023-10-04 ENCOUNTER — Ambulatory Visit: Payer: Medicare Other | Admitting: Vascular Surgery

## 2023-10-04 DIAGNOSIS — I7142 Juxtarenal abdominal aortic aneurysm, without rupture: Secondary | ICD-10-CM | POA: Diagnosis not present

## 2023-10-04 NOTE — Progress Notes (Signed)
Virtual Visit via Telephone Note  I connected with Peter [name] on 10/04/2023 using the [email or web address] by telephone and verified that I was speaking with the correct person using two identifiers. Patient was located at home and alone and I am at the office alone.   The limitations of evaluation and management by telemedicine and the availability of in person appointments have been previously discussed with the patient and are documented in the patients chart. The patient expressed understanding and consented to proceed.   Chief Complaint: AAA  History of Present Illness: Peter [name] is a 82 y.o. male with history of bilateral transcarotid artery stenting.  He also has a known abdominal aortic aneurysm and was previously referred to [facility] for evaluation.  He does not have any new back or abdominal pain.  He continues to be the primary caregiver for his wife.  Past Medical History:  Diagnosis Date   AAA (abdominal aortic aneurysm) (HCC)    Arthritis    CAD (coronary artery disease)    a. s/p CABG in 1997 b. stent to SVG-PDA and PTCA of LAD in 2003 c. cath in 2015 showing patent SVG-OM1-OM2, SVG-RI, SVG-D1, and LIMA-LAD with occluded SVG-PDA and severe stenosis of mid-LAD at LIMA insertion with DES placed d. 04/2018: similar results to 2015 with patent LAD stent; occlusion of small caliber D1 stenosis with medical management recommended.    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   Myocardial infarction [facility]) 1997   stents x2  (2003/2007)   Past Surgical History:  Procedure Laterality Date   CORONARY ANGIOPLASTY  2003/2007   2 stents   CORONARY ANGIOPLASTY WITH STENT PLACEMENT  08/14/2014   DES to LIMA-LAD insertion   CORONARY ARTERY BYPASS GRAFT  12/06/1995   7 vessels   CORONARY STENT INTERVENTION N/A 02/24/2020   Procedure: CORONARY STENT INTERVENTION;  Surgeon: Tonny Bollman, MD;  Location: [facility] INVASIVE CV LAB;  Service: Cardiovascular;  Laterality: N/A;   CORONARY STENT PLACEMENT  08/15/2014   MID LAD  DES  by Dr Normajean Glasgow ANGIOGRAPHY N/A 02/24/2020   Procedure: Rosalie Doctor ANGIOGRAPHY;  Surgeon: Tonny Bollman, MD;  Location: [facility] INVASIVE CV LAB;  Service: Cardiovascular;  Laterality: N/A;   CYSTECTOMY  12/06/2003   top of head-Jenkins   EAR CYST EXCISION  07/09/2012   Procedure: CYST REMOVAL;  Surgeon: Dalia Heading, MD;  Location: [facility] ORS;  Service: General;  Laterality: N/A;   EYE SURGERY Right    cataract removal and then another surgery after   HERNIA REPAIR     1980's inguinal Left side   LEFT HEART CATH AND CORS/GRAFTS ANGIOGRAPHY N/A 04/16/2018   Procedure: LEFT HEART CATH AND CORS/GRAFTS ANGIOGRAPHY;  Surgeon: Swaziland, Peter M, MD;  Location: [facility] INVASIVE CV LAB;  Service: Cardiovascular;  Laterality: N/A;   LEFT HEART CATHETERIZATION WITH CORONARY/GRAFT ANGIOGRAM N/A 08/14/2014   Procedure: LEFT HEART CATHETERIZATION WITH Isabel Caprice;  Surgeon: Micheline Chapman, MD;  Location: [facility] CATH LAB;  Service: Cardiovascular;  Laterality: N/A;   TEE WITHOUT CARDIOVERSION N/A 02/14/2022   Procedure: TRANSESOPHAGEAL ECHOCARDIOGRAM (TEE);  Surgeon: Meriam Sprague, MD;  Location: [facility] ORS;  Service: Cardiovascular;  Laterality: N/A;   TRANSCAROTID ARTERY REVASCULARIZATION  Left 03/25/2022   Procedure: LEFT TRANCAROTID ARTERY REVASCULARIZATION USING 10mm X 40mm ENROUTE STENT SYSTEM;  Surgeon: Maeola Harman, MD;  Location: [facility] OR;  Service: Vascular;  Laterality:  Left;   TRANSCAROTID ARTERY REVASCULARIZATION  Right 06/10/2022   Procedure: Right Transcarotid Artery Revascularization;  Surgeon: Maeola Harman, MD;  Location: [facility] OR;  Service: Vascular;  Laterality: Right;   ULTRASOUND GUIDANCE FOR VASCULAR ACCESS N/A 03/25/2022   Procedure: ULTRASOUND GUIDANCE FOR VASCULAR ACCESS;  Surgeon: Maeola Harman, MD;  Location: [facility] OR;  Service: Vascular;  Laterality: N/A;   ULTRASOUND GUIDANCE FOR VASCULAR ACCESS Left 06/10/2022   Procedure: ULTRASOUND GUIDANCE FOR VASCULAR ACCESS, LEFT FEMORAL VEIN;  Surgeon: Maeola Harman, MD;  Location: [facility] OR;  Service: Vascular;  Laterality: Left;    No outpatient medications have been marked as taking for the 10/04/23 encounter (Appointment) with Maeola Harman, MD.    12 system ROS was negative unless otherwise noted in HPI   Observations/Objective: Patient demonstrates good understanding of our conversation   CT IMPRESSION: 1. Infrarenal abdominal aortic aneurysm measuring up to 5.9 x 5.6 cm appears minimally larger since the where it measured 5.5 x 5.5 cm. Recommend follow-up CT or MR as appropriate in 6 months and referral to or continued care with vascular specialist. (Ref.: J Vasc Surg. 2018; 67:2-77 and J Am Coll Radiol 2013;10(10):789-794.) 2. 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 3. Sigmoid and descending colon diverticulosis. 4. Cholelithiasis.    Assessment and Plan: 82 year old male with now 6 cm abdominal aortic aneurysm that is juxtarenal in nature.  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  We previously discussed surgery including risk to his heart, risk for dialysis especially given the fact that we would likely sacrifice the right renal artery and also the accessory  left renal artery.  We have previously referred him to [facility] for evaluation but he does not want to have surgery there.  At this time patient states that he is comfortable if he dies from his aneurysm but will discuss further with his family.  He will need to follow-up in 10 months for carotid follow-up but at this time we will not pursue any further evaluation of his aneurysm unless he changes his mind on future repair.  He demonstrates good understanding of our conversation today.   I spent 12 minutes with the patient via telephone encounter.   Signed, Lemar Livings [facility] [place] Office: [phone]  10/04/2023, 3:55 PM

## 2023-10-17 ENCOUNTER — Other Ambulatory Visit: Payer: Self-pay | Admitting: Family Medicine

## 2023-10-20 ENCOUNTER — Other Ambulatory Visit: Payer: Self-pay

## 2023-10-20 DIAGNOSIS — I6523 Occlusion and stenosis of bilateral carotid arteries: Secondary | ICD-10-CM

## 2023-11-15 ENCOUNTER — Telehealth: Payer: Self-pay

## 2023-11-15 ENCOUNTER — Other Ambulatory Visit: Payer: Self-pay

## 2023-11-15 DIAGNOSIS — R058 Other specified cough: Secondary | ICD-10-CM

## 2023-11-15 NOTE — Telephone Encounter (Signed)
Persistent cough referral to pulmonologist would be fine

## 2023-11-15 NOTE — Telephone Encounter (Signed)
Reason for CRM: Patient wants a callback to inquire about a referral to see a lung specialist, that he spoke about with Dr. Gerda Diss. Pt is requesting a callback at [phone]   I do not see referral placed is this something that needs to be done?

## 2023-11-15 NOTE — Telephone Encounter (Signed)
Called pt to inform of Referral that has been placed and see if there was any questions, son Peter [name] states Peter [name] would like to be seen as son as possible by Dr Gerda Diss to discuss some things, could not get nay further information.

## 2023-11-16 NOTE — Telephone Encounter (Signed)
Patient scheduled office visit for discussion 11/30/23 with Dr Lorin Picket

## 2023-11-16 NOTE — Telephone Encounter (Signed)
So noted 

## 2023-11-30 ENCOUNTER — Ambulatory Visit: Payer: Medicare Other | Admitting: Family Medicine

## 2023-12-04 ENCOUNTER — Other Ambulatory Visit: Payer: Self-pay | Admitting: Family Medicine

## 2023-12-19 ENCOUNTER — Institutional Professional Consult (permissible substitution): Payer: Medicare Other | Admitting: Internal Medicine

## 2023-12-22 ENCOUNTER — Other Ambulatory Visit: Payer: Self-pay | Admitting: Family Medicine

## 2024-01-04 ENCOUNTER — Telehealth: Payer: Self-pay | Admitting: Family Medicine

## 2024-01-04 ENCOUNTER — Other Ambulatory Visit: Payer: Self-pay

## 2024-01-04 MED ORDER — ATORVASTATIN CALCIUM 80 MG PO TABS: 80.0000 mg | ORAL_TABLET | Freq: Every day | ORAL | 1 refills | Status: DC

## 2024-01-04 NOTE — Telephone Encounter (Signed)
Patient has been informed per provider notes and orders. Medication has been sent, contacted pharmacy.

## 2024-01-04 NOTE — Telephone Encounter (Signed)
Nurses Recently his insurance company denied the rosuvastatin. They will cover atorvastatin  Please let Peter [name] know that this medication is very similar to rosuvastatin.  80 mg tablet would be the recommended size for this medication.  Please talk with patient to make sure he is on board with this change.  Please cancel rosuvastatin. May send in atorvastatin 80 mg 1 daily May have 90 with 1 refill  Keep all regular follow-up visits

## 2024-01-11 ENCOUNTER — Other Ambulatory Visit: Payer: Self-pay | Admitting: Family Medicine

## 2024-01-11 NOTE — Telephone Encounter (Signed)
 Copied from CRM [id number]. Topic: Clinical - Medication Refill >> Jan 11, 2024  2:05 PM [name] wrote: Most Recent Primary Care Visit:  Provider: ALPHONSA HAMILTON A  Department: [facility]  Visit Type: OFFICE VISIT  Date: 09/22/2023  Medication: HYDROcodone -acetaminophen  (NORCO) 10-325 MG tablet  Has the patient contacted their pharmacy? Yes (Agent: If no, request that the patient contact the pharmacy for the refill. If patient does not wish to contact the pharmacy document the reason why and proceed with request.) (Agent: If yes, when and what did the pharmacy advise?)  Is this the correct pharmacy for this prescription? Yes If no, delete pharmacy and type the correct one.  This is the patient's preferred pharmacy:  [facility] [address] [place] - [address] [place] [address] [place] [address] Phone: [phone] Fax: [phone]   Has the prescription been filled recently? Yes  Is the patient out of the medication? Yes  Has the patient been seen for an appointment in the last year OR does the patient have an upcoming appointment? Yes  Can we respond through MyChart? Yes  Agent: Please be advised that Rx refills may take up to 3 business days. We ask that you follow-up with your pharmacy.

## 2024-01-11 NOTE — Telephone Encounter (Signed)
 Please touch base with Peter [name] try to find out how often he is having to utilize pain medicine for himself so that are prescription can reflect his usage thank you he has an office follow-up in mid February please keep

## 2024-01-12 ENCOUNTER — Other Ambulatory Visit: Payer: Self-pay

## 2024-01-12 MED ORDER — HYDROCODONE-ACETAMINOPHEN 10-325 MG PO TABS: ORAL_TABLET | ORAL | 0 refills | Status: DC

## 2024-01-12 NOTE — Telephone Encounter (Signed)
 Spoke with patient and he does state he is needing his pain medication refill to be sent to [facility] pharmacy , he is informed to keep 01/24/24 appt for follow up.

## 2024-01-22 DIAGNOSIS — E1122 Type 2 diabetes mellitus with diabetic chronic kidney disease: Secondary | ICD-10-CM | POA: Diagnosis not present

## 2024-01-22 DIAGNOSIS — N1832 Chronic kidney disease, stage 3b: Secondary | ICD-10-CM | POA: Diagnosis not present

## 2024-01-22 DIAGNOSIS — N1831 Chronic kidney disease, stage 3a: Secondary | ICD-10-CM | POA: Diagnosis not present

## 2024-01-23 ENCOUNTER — Encounter: Payer: Self-pay | Admitting: Family Medicine

## 2024-01-23 LAB — BASIC METABOLIC PANEL
BUN/Creatinine Ratio: 13 (ref 10–24)
BUN: 17 mg/dL (ref 8–27)
CO2: 22 mmol/L (ref 20–29)
Calcium: 9.3 mg/dL (ref 8.6–10.2)
Chloride: 100 mmol/L (ref 96–106)
Creatinine, Ser: 1.33 mg/dL — ABNORMAL HIGH (ref 0.76–1.27)
Glucose: 147 mg/dL — ABNORMAL HIGH (ref 70–99)
Potassium: 4.2 mmol/L (ref 3.5–5.2)
Sodium: 139 mmol/L (ref 134–144)
eGFR: 54 mL/min/{1.73_m2} — ABNORMAL LOW (ref >59)

## 2024-01-23 LAB — HEMOGLOBIN A1C
Est. average glucose Bld gHb Est-mCnc: 151 mg/dL
Hgb A1c MFr Bld: 6.9 % — ABNORMAL HIGH (ref 4.8–5.6)

## 2024-01-24 ENCOUNTER — Ambulatory Visit: Payer: Medicare Other | Admitting: Family Medicine

## 2024-02-07 ENCOUNTER — Other Ambulatory Visit: Payer: Self-pay | Admitting: Family Medicine

## 2024-02-07 ENCOUNTER — Ambulatory Visit: Payer: Medicare Other | Admitting: Family Medicine

## 2024-02-07 VITALS — BP 144/78 | HR 78 | Temp 98.2°F | Ht 72.0 in | Wt 179.4 lb

## 2024-02-07 DIAGNOSIS — E1122 Type 2 diabetes mellitus with diabetic chronic kidney disease: Secondary | ICD-10-CM

## 2024-02-07 DIAGNOSIS — R52 Pain, unspecified: Secondary | ICD-10-CM

## 2024-02-07 DIAGNOSIS — E785 Hyperlipidemia, unspecified: Secondary | ICD-10-CM | POA: Diagnosis not present

## 2024-02-07 DIAGNOSIS — R6889 Other general symptoms and signs: Secondary | ICD-10-CM

## 2024-02-07 DIAGNOSIS — N1832 Chronic kidney disease, stage 3b: Secondary | ICD-10-CM | POA: Diagnosis not present

## 2024-02-07 DIAGNOSIS — E1169 Type 2 diabetes mellitus with other specified complication: Secondary | ICD-10-CM

## 2024-02-07 DIAGNOSIS — N1831 Chronic kidney disease, stage 3a: Secondary | ICD-10-CM

## 2024-02-07 NOTE — Progress Notes (Signed)
 Subjective:    Patient ID: Peter [name], male    DOB: [birth year], 83 y.o.   MRN: [id number]  HPI Cold intolerance  Pain management  Type 2 diabetes mellitus with stage 3b chronic kidney disease, without long-term current use of insulin (HCC)  Hyperlipidemia associated with type 2 diabetes mellitus (HCC)  Chronic kidney disease, stage 3a (HCC)  The patient was seen today as part of a comprehensive diabetic check up. Patient has diabetes Patient relates good compliance with taking the medication. We discussed their diet and exercise activities  We also discussed the importance of notifying us if any excessively high glucoses or low sugars.    Patient for blood pressure check up.  The patient does have hypertension.   Patient relates dietary measures try to minimize salt The importance of healthy diet and activity were discussed Patient relates compliance  Patient here for follow-up regarding cholesterol.    Patient relates taking medication on a regular basis Denies problems with medication Importance of dietary measures discussed Regular lab work regarding lipid and liver was checked and if needing additional labs was appropriately ordered  This patient was seen today for chronic pain  The medication list was reviewed and updated.   Location of Pain for which the patient has been treated with regarding narcotics: Moderate back pain and sometimes knee pain  Onset of this pain: Present for years   -Compliance with medication: Good compliance with medicine uses it intermittently  - Number patient states they take daily: Does not take it often  -Reason for ongoing use of opioids does not get adequate relief with Tylenol NSAIDs  What other measures have been tried outside of opioids Tylenol NSAIDs  In the ongoing specialists regarding this condition none  -when was the last dose patient took?  Within the past several days  The patient was advised the importance  of maintaining medication and not using illegal substances with these.  Here for refills and follow up  The patient was educated that we can provide 3 monthly scripts for their medication, it is their responsibility to follow the instructions.  Side effects or complications from medications: Denies side effects  Patient is aware that pain medications are meant to minimize the severity of the pain to allow their pain levels to improve to allow for better function. They are aware of that pain medications cannot totally remove their pain.  Due for UDT ( at least once per year) (pain management contract is also completed at the time of the UDT): None currently  S Quality of the pain: Throbbing aching  Persistence of the pain: Intermittent  Modifying factors: Worse with activity        Review of Systems     Objective:   Physical Exam General-in no acute distress Eyes-no discharge Lungs-respiratory rate normal, CTA CV-no murmurs,RRR Extremities skin warm dry no edema Neuro grossly normal Behavior normal, alert        Assessment & Plan:  1. Cold intolerance (Primary) I believe that this is age-related I do not find any evidence hypothyroidism currently monitor  2. Pain management The patient was seen in followup for chronic pain. A review over at their current pain status was discussed. Drug registry was checked. Prescriptions were given.  Regular follow-up recommended. Discussion was held regarding the importance of compliance with medication as well as pain medication contract.  Patient was informed that medication may cause drowsiness and should not be combined  with other medications/alcohol or street  drugs. If the patient feels medication is causing altered alertness then do not drive or operate dangerous equipment.  Should be noted that the patient appears to be meeting appropriate use of opioids and response.  Evidenced by improved function and decent pain control  without significant side effects and no evidence of overt aberrancy issues.  Upon discussion with the patient today they understand that opioid therapy is optional and they feel that the pain has been refractory to reasonable conservative measures and is significant and affecting quality of life enough to warrant ongoing therapy and wishes to continue opioids.  Refills were provided.  [facility] medical Board guidelines regarding the pain medicine has been reviewed.  CDC guidelines most updated 2022 has been reviewed by the prescriber.  PDMP is checked on a regular basis yearly urine drug screen and pain management contract  Treatment plan for this patient includes #1-gentle stretching exercises as shown daily basis 2.  Mild strength exercises 3 times per week #3 continue pain medications #4 notify us if any digression We did discuss how as a person gets older it is wise to try to reduce the amount of pain medicine to lessen the risk of falls and injury  Rather than prescribing 60 tablets at a time we will prescribe 30 and he will let us know when he needs refills Follow-up within 4 months Will reduce it to hydrocodone 5/325   3. Type 2 diabetes mellitus with stage 3b chronic kidney disease, without long-term current use of insulin (HCC) The patient was seen today as part of a comprehensive visit for diabetes. The importance of keeping her A1c at or below 7 range was discussed.  Discussed diet, activity, and medication compliance Emphasized healthy eating primarily with vegetables fruits and if utilizing meats lean meats such as chicken or fish grilled baked broiled Avoid sugary drinks Minimize and avoid processed foods Fit in regular physical activity preferably 25 to 30 minutes 4 times per week Standard follow-up visit recommended.  Patient aware lack of control and follow-up increases risk of diabetic complications. Regular follow-up visits Yearly ophthalmology Yearly foot  exam Patient doing a good job keeping A1c under good control dietary measures  4. Hyperlipidemia associated with type 2 diabetes mellitus (HCC) On statin recent lab work looked good  5. Chronic kidney disease, stage 3a (HCC) Actual improvement GFR in the 50s avoid NSAIDs  Patient going through stress with his wife and hospice patient to follow-up within 4 months  Patient systolic blood pressure slightly elevated but it should be noted that he has a significant drop in blood pressure with standing therefore we will not add any additional medicines he is on losartan which will help with chronic kidney disease as well as help his blood pressure

## 2024-03-04 NOTE — Patient Outreach (Signed)
 Encournter opened in error  Dellrose L. Noelle Penner, RN, BSN, CCM [place]  [facility], [facility] RN Care Manager Direct Dial: [phone]  Fax: [phone]

## 2024-03-08 ENCOUNTER — Ambulatory Visit: Admitting: Family Medicine

## 2024-03-08 DIAGNOSIS — W57XXXA Bitten or stung by nonvenomous insect and other nonvenomous arthropods, initial encounter: Secondary | ICD-10-CM | POA: Diagnosis not present

## 2024-03-08 DIAGNOSIS — S70361A Insect bite (nonvenomous), right thigh, initial encounter: Secondary | ICD-10-CM | POA: Diagnosis not present

## 2024-03-08 DIAGNOSIS — F4321 Adjustment disorder with depressed mood: Secondary | ICD-10-CM | POA: Diagnosis not present

## 2024-03-08 MED ORDER — DOXYCYCLINE HYCLATE 100 MG PO TABS: 100.0000 mg | ORAL_TABLET | Freq: Two times a day (BID) | ORAL | 0 refills | Status: DC

## 2024-03-08 NOTE — Progress Notes (Signed)
   Subjective:    Patient ID: RAESHAWN VO, male    DOB: 05-20-1941, 83 y.o.   MRN: [id number]  HPI Patient had a tick bite right leg.  Some swelling redness tenderness.  No high fever chills or sweats Energy level okay  Also going through grief lost his wife of 64 years.    Review of Systems     Objective:   Physical Exam  Leg has a erythematous area approximately 1 cm in diameter some tenderness.  No bull's-eye no other findings.  No abscess      Assessment & Plan:  Tick bite Erythematous area Doxycycline twice daily 7 days Patient states he has taken it before without trouble Take with a snack and tall glass of water Warning signs such as high fevers severe redness swelling worsening issue follow-up

## 2024-04-20 ENCOUNTER — Other Ambulatory Visit: Payer: Self-pay | Admitting: Family Medicine

## 2024-04-23 ENCOUNTER — Other Ambulatory Visit: Payer: Self-pay

## 2024-04-23 MED ORDER — ACCU-CHEK GUIDE TEST VI STRP: ORAL_STRIP | 0 refills | Status: DC

## 2024-05-02 ENCOUNTER — Other Ambulatory Visit: Payer: Self-pay | Admitting: Family Medicine

## 2024-06-12 ENCOUNTER — Ambulatory Visit (INDEPENDENT_AMBULATORY_CARE_PROVIDER_SITE_OTHER): Admitting: Family Medicine

## 2024-06-12 ENCOUNTER — Encounter: Payer: Self-pay | Admitting: Family Medicine

## 2024-06-12 VITALS — BP 134/70 | HR 62 | Temp 98.2°F | Ht 72.0 in | Wt 175.0 lb

## 2024-06-12 DIAGNOSIS — N1832 Chronic kidney disease, stage 3b: Secondary | ICD-10-CM | POA: Diagnosis not present

## 2024-06-12 DIAGNOSIS — N289 Disorder of kidney and ureter, unspecified: Secondary | ICD-10-CM | POA: Diagnosis not present

## 2024-06-12 DIAGNOSIS — R5383 Other fatigue: Secondary | ICD-10-CM | POA: Diagnosis not present

## 2024-06-12 DIAGNOSIS — D509 Iron deficiency anemia, unspecified: Secondary | ICD-10-CM

## 2024-06-12 DIAGNOSIS — I10 Essential (primary) hypertension: Secondary | ICD-10-CM | POA: Diagnosis not present

## 2024-06-12 DIAGNOSIS — N1831 Chronic kidney disease, stage 3a: Secondary | ICD-10-CM | POA: Diagnosis not present

## 2024-06-12 DIAGNOSIS — E1122 Type 2 diabetes mellitus with diabetic chronic kidney disease: Secondary | ICD-10-CM | POA: Diagnosis not present

## 2024-06-12 DIAGNOSIS — K5900 Constipation, unspecified: Secondary | ICD-10-CM

## 2024-06-12 DIAGNOSIS — D6489 Other specified anemias: Secondary | ICD-10-CM

## 2024-06-12 DIAGNOSIS — R634 Abnormal weight loss: Secondary | ICD-10-CM

## 2024-06-12 NOTE — Progress Notes (Signed)
   Subjective:    Patient ID: Peter [name], male    DOB: 26-Mar-1941, 83 y.o.   MRN: [id number]  HPI  4 month follow up for chronic conditions , pt states has low energy since wife's passing  Patient relates that his appetite has not been great He tries to eat couple times a day He states he does get up often around 11 or noon and stays on the Internet or watches TV till midnight he will eat around 11 or noon he will eat again near suppertime snack some States bowel movements have been constipated but denies any hematuria or hematochezia No chest pain or shortness of breath Has lost a little bit of weight His energy is low he feels it is related to the loss of his wife and just not really being active He denies being depressed he states he is more just having occasional symptoms of feeling down Review of Systems     Objective:   Physical Exam General-in no acute distress Eyes-no discharge Lungs-respiratory rate normal, CTA CV-no murmurs,RRR Extremities skin warm dry no edema Neuro grossly normal Behavior normal, alert        Assessment & Plan:  1. Low energy (Primary) Lab work ordered Patient only eats twice a day This could be playing a role in this In addition to this his energy level is slowing down as he is getting older From he primarily stays in the house which could be affecting these things to degree - TSH + free T4 - CBC with Differential  2. Weight loss Patient needs to do a better job getting him calories try to get more protein in the diet along with the healthy habits of eating more often I doubt that there is underlying cancer but we need to do lab work to look at these issues  3. Type 2 diabetes mellitus with stage 3b chronic kidney disease, without long-term current use of insulin  (HCC) Continue current measures check A1c - Hemoglobin A1c  4. Constipation, unspecified constipation type Lab work ordered await results - Basic Metabolic Panel  5.  Chronic kidney disease, stage 3a (HCC) Lab work ordered await results - CBC with Differential - Iron, TIBC and Ferritin Panel  6. HTN (hypertension), benign Check lab work continue current measures blood pressure good - Basic Metabolic Panel - Hepatic Function Panel  7. 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, 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 Lab work ordered await results - Hepatic Function Panel  8. Renal insufficiency Lab work ordered - Basic Metabolic Panel - Hepatic Function Panel

## 2024-06-13 ENCOUNTER — Ambulatory Visit: Payer: Self-pay | Admitting: Family Medicine

## 2024-06-13 ENCOUNTER — Other Ambulatory Visit: Payer: Self-pay

## 2024-06-13 DIAGNOSIS — E038 Other specified hypothyroidism: Secondary | ICD-10-CM

## 2024-06-13 DIAGNOSIS — E538 Deficiency of other specified B group vitamins: Secondary | ICD-10-CM

## 2024-06-13 LAB — IRON,TIBC AND FERRITIN PANEL
Ferritin: 251 ng/mL (ref 30–400)
Iron Saturation: 49 % (ref 15–55)
Iron: 144 ug/dL (ref 38–169)
Total Iron Binding Capacity: 291 ug/dL (ref 250–450)
UIBC: 147 ug/dL (ref 111–343)

## 2024-06-13 LAB — CBC WITH DIFFERENTIAL/PLATELET
Basophils Absolute: 0.1 x10E3/uL (ref 0.0–0.2)
Basos: 1 %
EOS (ABSOLUTE): 0.5 x10E3/uL — ABNORMAL HIGH (ref 0.0–0.4)
Eos: 5 %
Hematocrit: 44.9 % (ref 37.5–51.0)
Hemoglobin: 14.9 g/dL (ref 13.0–17.7)
Immature Grans (Abs): 0 x10E3/uL (ref 0.0–0.1)
Immature Granulocytes: 0 %
Lymphocytes Absolute: 3 x10E3/uL (ref 0.7–3.1)
Lymphs: 32 %
MCH: 33.5 pg — ABNORMAL HIGH (ref 26.6–33.0)
MCHC: 33.2 g/dL (ref 31.5–35.7)
MCV: 101 fL — ABNORMAL HIGH (ref 79–97)
Monocytes Absolute: 0.7 x10E3/uL (ref 0.1–0.9)
Monocytes: 7 %
Neutrophils Absolute: 5.2 x10E3/uL (ref 1.4–7.0)
Neutrophils: 55 %
Platelets: 157 x10E3/uL (ref 150–450)
RBC: 4.45 x10E6/uL (ref 4.14–5.80)
RDW: 13.1 % (ref 11.6–15.4)
WBC: 9.5 x10E3/uL (ref 3.4–10.8)

## 2024-06-13 LAB — BASIC METABOLIC PANEL WITH GFR
BUN/Creatinine Ratio: 10 (ref 10–24)
BUN: 15 mg/dL (ref 8–27)
CO2: 21 mmol/L (ref 20–29)
Calcium: 9.6 mg/dL (ref 8.6–10.2)
Chloride: 101 mmol/L (ref 96–106)
Creatinine, Ser: 1.45 mg/dL — ABNORMAL HIGH (ref 0.76–1.27)
Glucose: 102 mg/dL — ABNORMAL HIGH (ref 70–99)
Potassium: 3.9 mmol/L (ref 3.5–5.2)
Sodium: 139 mmol/L (ref 134–144)
eGFR: 48 mL/min/1.73 — ABNORMAL LOW (ref >59)

## 2024-06-13 LAB — HEMOGLOBIN A1C
Est. average glucose Bld gHb Est-mCnc: 131 mg/dL
Hgb A1c MFr Bld: 6.2 % — ABNORMAL HIGH (ref 4.8–5.6)

## 2024-06-13 LAB — TSH+FREE T4
Free T4: 1.21 ng/dL (ref 0.82–1.77)
TSH: 6.96 u[IU]/mL — ABNORMAL HIGH (ref 0.450–4.500)

## 2024-06-13 LAB — HEPATIC FUNCTION PANEL
ALT: 20 IU/L (ref 0–44)
AST: 26 IU/L (ref 0–40)
Albumin: 4.3 g/dL (ref 3.7–4.7)
Alkaline Phosphatase: 117 IU/L (ref 44–121)
Bilirubin Total: 0.9 mg/dL (ref 0.0–1.2)
Bilirubin, Direct: 0.32 mg/dL (ref 0.00–0.40)
Total Protein: 7.5 g/dL (ref 6.0–8.5)

## 2024-06-19 ENCOUNTER — Other Ambulatory Visit: Payer: Self-pay | Admitting: Family Medicine

## 2024-07-25 ENCOUNTER — Other Ambulatory Visit: Payer: Self-pay | Admitting: Vascular Surgery

## 2024-07-25 DIAGNOSIS — I6523 Occlusion and stenosis of bilateral carotid arteries: Secondary | ICD-10-CM

## 2024-07-31 ENCOUNTER — Encounter: Payer: Self-pay | Admitting: Family Medicine

## 2024-07-31 NOTE — Telephone Encounter (Signed)
 Nurses Please reach out to Peter [name] I am out of the office but will be back next week Please get him an appointment with either myself or Elveria or Charmaine or Dr. Bluford thank you

## 2024-08-02 ENCOUNTER — Ambulatory Visit (INDEPENDENT_AMBULATORY_CARE_PROVIDER_SITE_OTHER): Admitting: Physician Assistant

## 2024-08-02 ENCOUNTER — Encounter: Payer: Self-pay | Admitting: Physician Assistant

## 2024-08-02 VITALS — BP 130/68 | HR 70 | Temp 98.1°F | Ht 72.0 in | Wt 177.0 lb

## 2024-08-02 DIAGNOSIS — H9311 Tinnitus, right ear: Secondary | ICD-10-CM

## 2024-08-02 DIAGNOSIS — H9313 Tinnitus, bilateral: Secondary | ICD-10-CM | POA: Insufficient documentation

## 2024-08-02 NOTE — Progress Notes (Signed)
   Acute Office Visit  Subjective:     Patient ID: Peter [name], male    DOB: 22-Jun-1941, 83 y.o.   MRN: [id number]  Discussed the use of AI scribe software for clinical note transcription with the patient, who gave verbal consent to proceed.  History of Present Illness Peter [name] is an 83 year old male who presents with ringing in the right ear.  He has experienced ringing in his right ear for the past week, with varying pitch. It is more pronounced at night, affecting sleep quality. There is no associated pain, ear discharge, fever, hearing loss, dizziness, headaches, or neck tightness. He has had similar episodes in the past, but they typically resolved within a day. Despite the irritation and sleep disruption, there is no impact on his ability to have conversations or hear his surroundings.    Review of Systems  Constitutional:  Negative for chills, fever and malaise/fatigue.  HENT:  Positive for tinnitus. Negative for congestion, ear discharge, ear pain and hearing loss.   Respiratory:  Negative for cough and shortness of breath.   Cardiovascular:  Negative for chest pain and palpitations.  Neurological:  Negative for dizziness and headaches.        Objective:     BP 130/68   Pulse 70   Temp 98.1 F (36.7 C)   Ht 6' (1.829 m)   Wt 177 lb (80.3 kg)   SpO2 98%   BMI 24.01 kg/m   Physical Exam Vitals reviewed.  Constitutional:      General: He is not in acute distress.    Appearance: Normal appearance. He is not ill-appearing.  HENT:     Right Ear: There is impacted cerumen.     Left Ear: Tympanic membrane normal.     Nose: Nose normal. No congestion or rhinorrhea.     Mouth/Throat:     Mouth: Mucous membranes are moist.     Pharynx: Oropharynx is clear.  Eyes:     Extraocular Movements: Extraocular movements intact.     Conjunctiva/sclera: Conjunctivae normal.  Cardiovascular:     Rate and Rhythm: Normal rate and regular rhythm.     Heart sounds: No  murmur heard.    No friction rub.  Pulmonary:     Effort: Pulmonary effort is normal.     Breath sounds: Normal breath sounds. No wheezing, rhonchi or rales.  Skin:    General: Skin is warm and dry.  Neurological:     General: No focal deficit present.     Mental Status: He is alert and oriented to person, place, and time.  Psychiatric:        Mood and Affect: Mood normal.        Behavior: Behavior normal.    No results found for any visits on 08/02/24.      Assessment & Plan:  Tinnitus of right ear Assessment & Plan: Tinnitus likely due to impacted cerumen, affecting sleep. TM non erythematous, non bulging, no ear canal swelling, no discharge.  - Flush out ear to remove earwax and assess if symptoms improve. - Re-evaluate if symptoms persist after earwax removal. - Discussed melatonin as needed for sleep.  - Advised patient to monitor for worsening or new symptoms such as fever, ear discharge, worsening pain, hearing loss, or increased tinnitus and follow up as indicated.      Return if symptoms worsen or fail to improve.  Charmaine Kynzi Levay, PA-C

## 2024-08-02 NOTE — Assessment & Plan Note (Addendum)
 Tinnitus likely due to impacted cerumen, affecting sleep. TM non erythematous, non bulging, no ear canal swelling, no discharge.  - Flush out ear to remove earwax and assess if symptoms improve. - Re-evaluate if symptoms persist after earwax removal. - Discussed melatonin as needed for sleep.  - Advised patient to monitor for worsening or new symptoms such as fever, ear discharge, worsening pain, hearing loss, or increased tinnitus and follow up as indicated.

## 2024-08-07 ENCOUNTER — Ambulatory Visit (INDEPENDENT_AMBULATORY_CARE_PROVIDER_SITE_OTHER): Admitting: Physician Assistant

## 2024-08-07 ENCOUNTER — Encounter: Payer: Self-pay | Admitting: Physician Assistant

## 2024-08-07 VITALS — BP 138/78 | HR 68 | Temp 97.3°F | Ht 72.0 in | Wt 180.0 lb

## 2024-08-07 DIAGNOSIS — I10 Essential (primary) hypertension: Secondary | ICD-10-CM

## 2024-08-07 DIAGNOSIS — H9313 Tinnitus, bilateral: Secondary | ICD-10-CM | POA: Diagnosis not present

## 2024-08-07 DIAGNOSIS — E038 Other specified hypothyroidism: Secondary | ICD-10-CM | POA: Diagnosis not present

## 2024-08-07 DIAGNOSIS — E538 Deficiency of other specified B group vitamins: Secondary | ICD-10-CM | POA: Diagnosis not present

## 2024-08-07 NOTE — Assessment & Plan Note (Signed)
 154/72, 138/78 Stable. Continue current medications. No change in management. Discussed DASH diet and dietary sodium restrictions.  Continue dietary efforts and physical activity.

## 2024-08-07 NOTE — Assessment & Plan Note (Signed)
 Patient presents today with complaints of continued tinnitus. Overall reassuring exam, normal TMs, hearing intact. He denies associated symptoms. Reassurance offered. Discussed symptomatic management, such as white noise at bedtime. Referral placed to ENT for persistent symptoms. Warning signs reviewed.

## 2024-08-07 NOTE — Progress Notes (Signed)
   Acute Office Visit  Subjective:     Patient ID: Peter [name], male    DOB: 1941-08-30, 83 y.o.   MRN: [id number]   Patient presents today for follow up regarding tinnitus. He was seen in office last Friday for the same complaint. At that time, right ear was irrigated due to impacted cerumen. Today, he reports symptoms are still present, however are more intermittent than Friday. He relates tinnitus is worse at night time. He denies associated symptoms such as headache, dizziness, syncope, or hearing loss.   Additionally, he reports blood pressure readings have been running high at home. He relates daily medication compliance. He denies chest pain, shortness of breath, or visual changes.     Review of Systems  Constitutional:  Negative for chills, fever and malaise/fatigue.  HENT:  Positive for tinnitus. Negative for ear discharge, ear pain and hearing loss.   Eyes:  Negative for blurred vision and double vision.  Respiratory:  Negative for shortness of breath.   Cardiovascular:  Negative for chest pain and palpitations.  Neurological:  Negative for dizziness and headaches.        Objective:     BP 138/78   Pulse 68   Temp (!) 97.3 F (36.3 C)   Ht 6' (1.829 m)   Wt 180 lb (81.6 kg)   SpO2 100%   BMI 24.41 kg/m   Physical Exam Constitutional:      Appearance: Normal appearance. He is normal weight.  HENT:     Head: Normocephalic and atraumatic.     Right Ear: Tympanic membrane normal.     Left Ear: Tympanic membrane normal.     Mouth/Throat:     Mouth: Mucous membranes are moist.     Pharynx: Oropharynx is clear.  Eyes:     Extraocular Movements: Extraocular movements intact.     Conjunctiva/sclera: Conjunctivae normal.  Cardiovascular:     Rate and Rhythm: Normal rate and regular rhythm.     Heart sounds: Normal heart sounds. No murmur heard. Pulmonary:     Effort: Pulmonary effort is normal.     Breath sounds: Normal breath sounds.  Skin:    General:  Skin is warm and dry.  Neurological:     General: No focal deficit present.     Mental Status: He is alert and oriented to person, place, and time.  Psychiatric:        Mood and Affect: Mood normal.        Behavior: Behavior normal.     No results found for any visits on 08/07/24.      Assessment & Plan:  Bilateral tinnitus Assessment & Plan: Patient presents today with complaints of continued tinnitus. Overall reassuring exam, normal TMs, hearing intact. He denies associated symptoms. Reassurance offered. Discussed symptomatic management, such as white noise at bedtime. Referral placed to ENT for persistent symptoms. Warning signs reviewed.   Orders: -     Ambulatory referral to ENT  HTN (hypertension), benign Assessment & Plan: 154/72, 138/78 Stable. Continue current medications. No change in management. Discussed DASH diet and dietary sodium restrictions.  Continue dietary efforts and physical activity.      Return if symptoms worsen or fail to improve.  Charmaine Luvern Mcisaac, PA-C

## 2024-08-08 ENCOUNTER — Ambulatory Visit: Payer: Self-pay | Admitting: Family Medicine

## 2024-08-08 DIAGNOSIS — E038 Other specified hypothyroidism: Secondary | ICD-10-CM

## 2024-08-08 LAB — VITAMIN B12: Vitamin B-12: 411 pg/mL (ref 232–1245)

## 2024-08-08 LAB — TSH: TSH: 8.85 u[IU]/mL — ABNORMAL HIGH (ref 0.450–4.500)

## 2024-08-08 NOTE — Telephone Encounter (Signed)
 Lab order placed.

## 2024-08-13 ENCOUNTER — Telehealth: Payer: Self-pay

## 2024-08-13 NOTE — Telephone Encounter (Signed)
 R/C to Patient.  Spoke with Patient - Patient is aware of Referral information and aware the ENT Office will call him in regards to Appt Scheduling.

## 2024-08-13 NOTE — Telephone Encounter (Signed)
 Peter [name] come by the office wanting a follow up on referral to ENT   Pt call back [phone]

## 2024-08-20 ENCOUNTER — Other Ambulatory Visit: Payer: Self-pay | Admitting: Family Medicine

## 2024-08-28 ENCOUNTER — Ambulatory Visit (HOSPITAL_COMMUNITY)
Admission: RE | Admit: 2024-08-28 | Discharge: 2024-08-28 | Disposition: A | Discharge status: Home / Self Care | Source: Ambulatory Visit | Attending: Vascular Surgery | Admitting: Vascular Surgery

## 2024-08-28 ENCOUNTER — Ambulatory Visit: Admitting: Physician Assistant

## 2024-08-28 VITALS — BP 174/100 | HR 66 | Temp 97.5°F | Ht 72.0 in | Wt 175.2 lb

## 2024-08-28 DIAGNOSIS — I7142 Juxtarenal abdominal aortic aneurysm, without rupture: Secondary | ICD-10-CM | POA: Diagnosis not present

## 2024-08-28 DIAGNOSIS — I6523 Occlusion and stenosis of bilateral carotid arteries: Secondary | ICD-10-CM | POA: Insufficient documentation

## 2024-08-30 ENCOUNTER — Encounter (HOSPITAL_COMMUNITY): Payer: Self-pay

## 2024-08-30 ENCOUNTER — Ambulatory Visit: Payer: Self-pay

## 2024-08-30 ENCOUNTER — Emergency Department (HOSPITAL_COMMUNITY): Admission: EM | Admit: 2024-08-30 | Discharge: 2024-08-30 | Disposition: A | Discharge status: Home / Self Care

## 2024-08-30 ENCOUNTER — Other Ambulatory Visit: Payer: Self-pay

## 2024-08-30 DIAGNOSIS — Z7982 Long term (current) use of aspirin: Secondary | ICD-10-CM | POA: Diagnosis not present

## 2024-08-30 DIAGNOSIS — I10 Essential (primary) hypertension: Secondary | ICD-10-CM | POA: Insufficient documentation

## 2024-08-30 DIAGNOSIS — Z79899 Other long term (current) drug therapy: Secondary | ICD-10-CM | POA: Diagnosis not present

## 2024-08-30 DIAGNOSIS — R519 Headache, unspecified: Secondary | ICD-10-CM | POA: Diagnosis not present

## 2024-08-30 LAB — COMPREHENSIVE METABOLIC PANEL WITH GFR
ALT: 21 U/L (ref 0–44)
AST: 29 U/L (ref 15–41)
Albumin: 3.9 g/dL (ref 3.5–5.0)
Alkaline Phosphatase: 83 U/L (ref 38–126)
Anion gap: 12 (ref 5–15)
BUN: 17 mg/dL (ref 8–23)
CO2: 23 mmol/L (ref 22–32)
Calcium: 9.1 mg/dL (ref 8.9–10.3)
Chloride: 102 mmol/L (ref 98–111)
Creatinine, Ser: 1.45 mg/dL — ABNORMAL HIGH (ref 0.61–1.24)
GFR, Estimated: 48 mL/min — ABNORMAL LOW (ref >60)
Glucose, Bld: 121 mg/dL — ABNORMAL HIGH (ref 70–99)
Potassium: 4 mmol/L (ref 3.5–5.1)
Sodium: 137 mmol/L (ref 135–145)
Total Bilirubin: 1.6 mg/dL — ABNORMAL HIGH (ref 0.0–1.2)
Total Protein: 7.5 g/dL (ref 6.5–8.1)

## 2024-08-30 LAB — CBC WITH DIFFERENTIAL/PLATELET
Abs Immature Granulocytes: 0.02 K/uL (ref 0.00–0.07)
Basophils Absolute: 0.1 K/uL (ref 0.0–0.1)
Basophils Relative: 1 %
Eosinophils Absolute: 0.6 K/uL — ABNORMAL HIGH (ref 0.0–0.5)
Eosinophils Relative: 6 %
HCT: 42 % (ref 39.0–52.0)
Hemoglobin: 14.1 g/dL (ref 13.0–17.0)
Immature Granulocytes: 0 %
Lymphocytes Relative: 27 %
Lymphs Abs: 2.3 K/uL (ref 0.7–4.0)
MCH: 33.3 pg (ref 26.0–34.0)
MCHC: 33.6 g/dL (ref 30.0–36.0)
MCV: 99.3 fL (ref 80.0–100.0)
Monocytes Absolute: 0.7 K/uL (ref 0.1–1.0)
Monocytes Relative: 8 %
Neutro Abs: 5.1 K/uL (ref 1.7–7.7)
Neutrophils Relative %: 58 %
Platelets: 128 K/uL — ABNORMAL LOW (ref 150–400)
RBC: 4.23 MIL/uL (ref 4.22–5.81)
RDW: 13.7 % (ref 11.5–15.5)
WBC: 8.7 K/uL (ref 4.0–10.5)
nRBC: 0 % (ref 0.0–0.2)

## 2024-08-30 LAB — TSH: TSH: 5.036 u[IU]/mL — ABNORMAL HIGH (ref 0.350–4.500)

## 2024-08-30 NOTE — ED Provider Notes (Signed)
 [name] [facility] AT [facility] Provider Note   CSN: [id number] Arrival date & time: 08/30/24  1520     Patient presents with: Hypertension and Headache   Peter [name] is a 83 y.o. male.    Hypertension Associated symptoms include headaches.  Headache   Presents because of hypertension.  Patient states has been having high blood pressure readings at home every day for the past month.  He has not discussed this with his primary care physician.  Patient states he will wake up and subsequent take his blood pressure.  He states that his average systolic is around 175 for the past month.  He is not sure about the diastolic.  He takes his blood pressure before he takes his medication.  After take this medication, subsequently blood pressure will improve.  Patient states that he did take his blood pressure medication today.  End up coming in today because of ongoing hypertension.  Patient endorses episodic headaches that happens every once in a while but no current headache.  No vision changes.  No diplopia.  No numbness or tingling anywhere.  No difficulty walking.  No speech changes.  Denies all complaints other than hypertension.  No chest pain or shortness of breath.     Previous medical history reviewed : Patient was seen at primary care office.  Last seen on September 3.  History of hypertension.  Patient on metoprolol  succinate Toprol -XL 25 mg 24-hour tablet.  Losartan  25 mg once daily.    Prior to Admission medications   Medication Sig Start Date End Date Taking? Authorizing Provider  ACCU-CHEK GUIDE TEST test strip USE AS DIRECTED 08/21/24   Alphonsa Glendia LABOR, MD  acetaminophen  (TYLENOL ) 500 MG tablet Take 1,000 mg by mouth every 6 (six) hours as needed for mild pain.    [provider]  Aloe Vera Juice LIQD Take 8 oz by mouth daily.    [provider]  aspirin  EC 81 MG tablet Take 1 tablet (81 mg total) by mouth daily with breakfast.  02/14/22   Pearlean Manus, MD  atorvastatin  (LIPITOR ) 80 MG tablet Take 1 tablet by mouth once daily 08/21/24   Alphonsa Glendia LABOR, MD  Blood Glucose Monitoring Suppl (ACCU-CHEK GUIDE) w/Device KIT Use to check blood sugars BID DX. E11.9 01/10/23   Alphonsa Glendia LABOR, MD  Cholecalciferol (VITAMIN D3) 250 MCG (10000 UT) TABS Take 10,000 Units by mouth 3 (three) times a week.    [provider]  diphenhydrAMINE  (BENADRYL ) 50 MG capsule Take one capsule 1 hour prior to scan. 08/24/23   Sheree Penne Bruckner, MD  losartan  (COZAAR ) 25 MG tablet Take 1 tablet by mouth once daily 12/22/23   Alphonsa Glendia LABOR, MD  metoprolol  succinate (TOPROL -XL) 25 MG 24 hr tablet Take 1 tablet by mouth once daily 06/19/24   Alphonsa Glendia LABOR, MD  ticagrelor  (BRILINTA ) 60 MG TABS tablet Take 1 tablet (60 mg total) by mouth 2 (two) times daily. 12/27/22   Alvan Dorn FALCON, MD  vitamin E 180 MG (400 UNITS) capsule Take 400 Units by mouth See admin instructions. Twice month    [provider]    Allergies: Dye fdc red [red dye #40 (allura red)], Ivp dye [iodinated contrast media], Plavix [clopidogrel], and Keflex  [cephalexin ]    Review of Systems  Neurological:  Positive for headaches.    Updated Vital Signs BP (!) 173/83   Pulse 71   Temp 97.8 F (36.6 C) (Oral)   Resp  12   Ht 6' (1.829 m)   Wt 79.4 kg   SpO2 98%   BMI 23.73 kg/m   Physical Exam  (all labs ordered are listed, but only abnormal results are displayed) Labs Reviewed  CBC WITH DIFFERENTIAL/PLATELET - Abnormal; Notable for the following components:      Result Value   Platelets 128 (*)    Eosinophils Absolute 0.6 (*)    All other components within normal limits  COMPREHENSIVE METABOLIC PANEL WITH GFR - Abnormal; Notable for the following components:   Glucose, Bld 121 (*)    Creatinine, Ser 1.45 (*)    Total Bilirubin 1.6 (*)    GFR, Estimated 48 (*)    All other components within normal limits  TSH     EKG: None  Radiology: No results found.   Procedures   Medications Ordered in the ED - No data to display                                  Medical Decision Making Amount and/or Complexity of Data Reviewed Labs: ordered.     Presents because of hypertension.  Patient states has been having high blood pressure readings at home every day for the past month.  He has not discussed this with his primary care physician.  Patient states he will wake up and subsequent take his blood pressure.  He states that his average systolic is around 175 for the past month.  He is not sure about the diastolic.  He takes his blood pressure before he takes his medication.  After take this medication, subsequently blood pressure will improve.  Patient states that he did take his blood pressure medication today.  End up coming in today because of ongoing hypertension.  Patient endorses episodic headaches that happens every once in a while but no current headache.  No vision changes.  No diplopia.  No numbness or tingling anywhere.  No difficulty walking.  No speech changes.  Denies all complaints other than hypertension.  No chest pain or shortness of breath.     Previous medical history reviewed : Patient was seen at primary care office.  Last seen on September 3.  History of hypertension.  Patient on metoprolol  succinate Toprol -XL 25 mg 24-hour tablet.  Losartan  25 mg once daily.   Upon exam, patient hypertensive but otherwise hemodynamically stable.  98 on room air.  No tachycardia.  NIH is 0.  Cranials 2 through 12 intact.  No focal deficits.  Normal finger-nose.  Romberg negative.  No vision changes.  No aphasia no dysarthria.  Strength and sensation intact in all extremities.  No current headache.  No indication for neuroimaging.  EKG reviewed. No changes compared to prior. Sinus.   Laboratory workup.  Is mostly interested in making sure patient does not have any kind of worsening CKD.   Creatinine of 1.45 which is around his baseline.  Platelets of 128.  Slightly lower than baseline this could be followed with PCP.  T bilirubin at 1.6.  No pain to palpation of the right upper quadrant.  This can also be followed.  No indication for imaging or further workup.  Blood pressure decreased to systolics 160s to 170s without intervention.  Explained to the patient that he needs to write down his blood pressure twice per day including 1 hour after taking his blood pressure medication in morning as well as before he goes  to sleep.  Take these readings to his PCP so that weight medication can be adjusted appropriately.  Structure stable addition.  Do not all chest pain or shortness of breath throughout the ED stay.  Normal sinus rhythm on cardiac telemetry.      Final diagnoses:  Hypertension, unspecified type    ED Discharge Orders     None          Simon Lavonia SAILOR, MD 08/30/24 1651

## 2024-08-30 NOTE — Telephone Encounter (Signed)
 FYI Only or Action Required?: FYI only for provider.  Patient was last seen in primary care on 08/07/2024 by Grooms, [place], [place].  Called Nurse Triage reporting Hypertension.  Symptoms began several days ago.  Interventions attempted: Prescription medications: Losartan  and Metoprolol .  Symptoms are: gradually worsening.  Triage Disposition: Go to ED Now (Notify PCP)  Patient/caregiver understands and will follow disposition?: Yes Daughter advising that patients BP has been elevated over the past several days. Pt also endorsing blurred vision, ears ringing, and fatigue. RN advising ED, daughter is agreeable and will assist patient to go  Daughter also expressed concern that patient is giving up.  Patient's wife of 60 years recently past away this year and patient has not adjusted.  Daughter says that patient is not as active and engaged as he was before and she is concerned.   Copied from CRM [id number]. Topic: Clinical - Red Word Triage >> Aug 30, 2024  1:53 PM Joesph PARAS wrote: Red Word that prompted transfer to Nurse Triage: Patient's daughter Almarie is calling in (c/b [phone]) to state that she needs medical advisement for a blood pressure 176/94, where that was a few minutes ago and on the very low spectrum of his readings as of recent. Estimates readings around 190/112 (dated yesterday, reported by niece). Saw [facility] for this and they referred to Cardiology who they could not get in touch with. Attempting to get medical advisement from prescriber of blood pressure medication, Dr. Alphonsa. Reason for Disposition  [1] Systolic BP >= 160 OR Diastolic >= 100 AND [2] cardiac (e.g., breathing difficulty, chest pain) or neurologic symptoms (e.g., new-onset blurred or double vision, unsteady gait)  Answer Assessment - Initial Assessment Questions 1. BLOOD PRESSURE: What is your blood pressure? Did you take at least two measurements 5 minutes apart?     176/94  2. ONSET: When did you  take your blood pressure?     Checking a few minutes  3. HOW: How did you take your blood pressure? (e.g., automatic home BP monitor, visiting nurse)     Taken at home  4. HISTORY: Do you have a history of high blood pressure?     Yes  5. MEDICINES: Are you taking any medicines for blood pressure? Have you missed any doses recently?     Metoprolol  and losartan . Duaghter unsure if patient is taking BP meds. Daughter states that patient gets up at various times of the day so if he is taking them, he is not taking them at consistent time  6. OTHER SYMPTOMS: Do you have any symptoms? (e.g., blurred vision, chest pain, difficulty breathing, headache, weakness)     Fatigue, ears ringing, blurred vision  Protocols used: Blood Pressure - High-A-AH

## 2024-08-30 NOTE — ED Triage Notes (Signed)
 Patient BIB [facility]  from home for complaint of slight headache and having blood pressure issues since August. EMS BP 160/92 on scene, 90 heart rate. Denise dizziness. Hx of HTN and high cholesterol.

## 2024-08-30 NOTE — Discharge Instructions (Addendum)
 Please record your blood pressure every day.  Write this down on the piece of paper that I gave you.  Please take these recordings to your PCP. Take your blood pressure one hour after taking your medications and at nighttime before bed.   Please give them a call to see them next week.  They may want to adjust your medication.  We do not like adjusting medication here from the ED because we are afraid that we can drop your blood pressure too much given that we only see you 1 time.  We do not want to drop it too much and you have fall.  Your bilirubin was slightly elevated. You have no abdominal pain. If you start having abdominal pain, then please come back to the ED.   If you ever experience chest pain or shortness of breath then please come back to your ED.    Your thyroid was elevated as well. This needs to be followed by your primary care physician as well.

## 2024-08-31 LAB — T4, FREE: Free T4: 0.91 ng/dL (ref 0.61–1.12)

## 2024-08-31 LAB — T3, FREE: T3, Free: 2.6 pg/mL (ref 2.0–4.4)

## 2024-09-01 NOTE — Progress Notes (Signed)
 Office Note   History of Present Illness   Peter [name] is a 83 y.o. (Apr 15, 1941) male who presents for surveillance of carotid artery stenosis.  He has a history of left TCAR on 03/25/2022 and right TCAR on 06/10/2022, both, both performed by Dr. Sheree.  He also has a known 6 cm AAA.  The patient has previously opted out of repair for his AAA and is aware of the future risk of rupture.  He returns today for follow-up.  He says he is doing okay at today's office visit.  Earlier this year he lost his wife and he has been grieving since then.  He says he has not been taking the best care of himself.  Over the past month he has had persistently elevated blood pressures, with average systolic pressures in the 170s.  He says sometimes his blood pressure is even higher than this.  Usually his blood pressure goes down to 130-140 systolic after taking his medications.  He endorses intermittent headaches when his blood pressure is elevated.  He is planning on calling his PCP to discuss management of his blood pressure.  Otherwise he denies any strokelike symptoms such as slurred speech, facial droop, sudden weakness/numbness, or sudden visual changes.  He also denies any abdominal or back pain.  Current Outpatient Medications  Medication Sig Dispense Refill   ACCU-CHEK GUIDE TEST test strip USE AS DIRECTED 100 each 0   acetaminophen  (TYLENOL ) 500 MG tablet Take 1,000 mg by mouth every 6 (six) hours as needed for mild pain.     Aloe Vera Juice LIQD Take 8 oz by mouth daily.     aspirin  EC 81 MG tablet Take 1 tablet (81 mg total) by mouth daily with breakfast. 90 tablet 3   atorvastatin  (LIPITOR ) 80 MG tablet Take 1 tablet by mouth once daily 90 tablet 0   Blood Glucose Monitoring Suppl (ACCU-CHEK GUIDE) w/Device KIT Use to check blood sugars BID DX. E11.9 1 kit 0   Cholecalciferol (VITAMIN D3) 250 MCG (10000 UT) TABS Take 10,000 Units by mouth 3 (three) times a week.     diphenhydrAMINE  (BENADRYL ) 50 MG  capsule Take one capsule 1 hour prior to scan. 1 capsule 0   losartan  (COZAAR ) 25 MG tablet Take 1 tablet by mouth once daily 90 tablet 0   metoprolol  succinate (TOPROL -XL) 25 MG 24 hr tablet Take 1 tablet by mouth once daily 90 tablet 0   ticagrelor  (BRILINTA ) 60 MG TABS tablet Take 1 tablet (60 mg total) by mouth 2 (two) times daily. 60 tablet 11   vitamin E 180 MG (400 UNITS) capsule Take 400 Units by mouth See admin instructions. Twice month     No current facility-administered medications for this visit.    REVIEW OF SYSTEMS (negative unless checked):   Cardiac:  []  Chest pain or chest pressure? []  Shortness of breath upon activity? []  Shortness of breath when lying flat? []  Irregular heart rhythm?  Vascular:  []  Pain in calf, thigh, or hip brought on by walking? []  Pain in feet at night that wakes you up from your sleep? []  Blood clot in your veins? []  Leg swelling?  Pulmonary:  []  Oxygen at home? []  Productive cough? []  Wheezing?  Neurologic:  []  Sudden weakness in arms or legs? []  Sudden numbness in arms or legs? []  Sudden onset of difficult speaking or slurred speech? []  Temporary loss of vision in one eye? []  Problems with dizziness?  Gastrointestinal:  []  Blood  in stool? []  Vomited blood?  Genitourinary:  []  Burning when urinating? []  Blood in urine?  Psychiatric:  []  Major depression  Hematologic:  []  Bleeding problems? []  Problems with blood clotting?  Dermatologic:  []  Rashes or ulcers?  Constitutional:  []  Fever or chills?  Ear/Nose/Throat:  []  Change in hearing? []  Nose bleeds? []  Sore throat?  Musculoskeletal:  []  Back pain? []  Joint pain? []  Muscle pain?   Physical Examination   Vitals:   08/28/24 1317 08/28/24 1319 08/28/24 1352  BP: (!) 197/94 (!) 194/113 (!) 174/100  Pulse: 66    Temp: (!) 97.5 F (36.4 C)    TempSrc: Temporal    Weight: 175 lb 3.2 oz (79.5 kg)    Height: 6' (1.829 m)     Body mass index is 23.76  kg/m.  General:  WDWN in NAD; vital signs documented above Gait: Not observed HENT: WNL, normocephalic Pulmonary: normal non-labored breathing Cardiac: Regular, hypertensive Abdomen: soft, NT, no masses Skin: without rashes Vascular Exam/Pulses: palpable radial pulses bilaterally Extremities: without ischemic changes, without gangrene , without cellulitis; without open wounds;  Musculoskeletal: no muscle wasting or atrophy  Neurologic: A&O X 3;  No focal weakness or paresthesias are detected Psychiatric:  The pt has Normal affect.  Non-Invasive Vascular Imaging   Bilateral Carotid Duplex (08/28/2024):  Patent bilateral carotid artery stents without hemodynamically significant stenosis   Medical Decision Making   Peter [name] is a 83 y.o. male who presents for surveillance of carotid artery stenosis  Based on the patient's vascular studies, his carotid artery stents are patent without hemodynamically significant stenosis He denies any strokelike symptoms such as slurred speech, facial droop, sudden visual changes, or sudden weakness/numbness. Recently his hypertension has gotten worse with average systolic pressures in the 170s.  He has been taking his medications, however his blood pressure usually only improves to the 140s systolic.  He does endorse intermittent headaches when his blood pressure is elevated.  He denies any headache today.  I have encouraged him to call his PCP this week to discuss adjustments of his blood pressure medication On exam he has no neurological deficits.  He has palpable and equal radial pulses bilaterally. He can follow-up with our office in 1 year with repeat carotid duplex  Ahmed Holster PA-C [facility] Office: [phone]  Clinic MD: Sheree

## 2024-09-02 ENCOUNTER — Ambulatory Visit: Payer: Self-pay

## 2024-09-02 NOTE — Telephone Encounter (Signed)
 FYI Only or Action Required?: FYI only for provider.  Patient was last seen in primary care on 08/07/2024 by Grooms, [place], [place].  Called Nurse Triage reporting Hypertension.  Symptoms began several weeks ago.  Interventions attempted: Rest, hydration, or home remedies.  Symptoms are: unchanged.  Triage Disposition: See PCP When Office is Open (Within 3 Days)  Patient/caregiver understands and will follow disposition?: Yes, will follow disposition  Message from Peter [name] sent at 09/02/2024  2:38 PM EDT  Patients Daughter Peter [name] on [facility]) is calling on behalf on the patient. She states his blood pressure has been high for some time now and they passed by the ER on the 26th. He has a ringing sensation on his ear as well as sporadic vision issues and his daughter wants to see if med management is needed for his medication or what else can be done to assist   Callback: [phone]   Reason for Disposition  Systolic BP >= 160 OR Diastolic >= 100  Answer Assessment - Initial Assessment Questions 1. BLOOD PRESSURE: What is your blood pressure? Did you take at least two measurements 5 minutes apart?     150/90 something,  2. ONSET: When did you take your blood pressure?     Ongoing for few weeks, pt told daughter thought was elevated since March 3. HOW: How did you take your blood pressure? (e.g., automatic home BP monitor, visiting nurse)     Auto cuff 4. HISTORY: Do you have a history of high blood pressure?     Hx of HTN 5. MEDICINES: Are you taking any medicines for blood pressure? Have you missed any doses recently?     Daughter states that he is taking medication like he is suppose to 6. OTHER SYMPTOMS: Do you have any symptoms? (e.g., blurred vision, chest pain, difficulty breathing, headache, weakness)     Denies  Per daughter pt was advised by NT to go to ED d/t vision changes and HTN readings. Pt daughter states that ED DR called her and told her  the pt should not come to the ER for high blood pressure, he needs to see his PCP, at the time pt was having blurry vision and readings of  190 systolic, pt also had ringing in ears.  Pt sched for hosp f/u and continued HTN readings and fatigue.  Protocols used: Blood Pressure - High-A-AH

## 2024-09-02 NOTE — Telephone Encounter (Signed)
 Patient was advise to go to ER by triage nurse

## 2024-09-03 ENCOUNTER — Telehealth: Payer: Self-pay | Admitting: Cardiology

## 2024-09-03 ENCOUNTER — Encounter: Payer: Self-pay | Admitting: Family Medicine

## 2024-09-03 ENCOUNTER — Ambulatory Visit (INDEPENDENT_AMBULATORY_CARE_PROVIDER_SITE_OTHER): Admitting: Family Medicine

## 2024-09-03 VITALS — BP 159/75 | HR 84 | Ht 72.0 in | Wt 176.0 lb

## 2024-09-03 DIAGNOSIS — I10 Essential (primary) hypertension: Secondary | ICD-10-CM | POA: Diagnosis not present

## 2024-09-03 DIAGNOSIS — H9313 Tinnitus, bilateral: Secondary | ICD-10-CM | POA: Diagnosis not present

## 2024-09-03 MED ORDER — TICAGRELOR 60 MG PO TABS: 60.0000 mg | ORAL_TABLET | Freq: Two times a day (BID) | ORAL | 2 refills | Status: DC

## 2024-09-03 MED ORDER — LOSARTAN POTASSIUM 50 MG PO TABS
50.0000 mg | ORAL_TABLET | Freq: Every day | ORAL | 3 refills | Status: AC
Start: 2024-09-03 — End: ?

## 2024-09-03 NOTE — Telephone Encounter (Signed)
 May have same-day appointment with myself this week-would be best for me to see him in person check his blood pressure discussed medications and any questions he has thank you

## 2024-09-03 NOTE — Patient Instructions (Signed)
 Follow up in 1 week.   Lab in 1 week.   Losartan  increased.  Take care  Dr. Bluford

## 2024-09-03 NOTE — Telephone Encounter (Signed)
 Patient calling the office for samples of medication:   1.  What medication and dosage are you requesting samples for? icagrelor (BRILINTA ) 60 MG TABS tablet [[id number]]   2.  Are you currently out of this medication? Has 4 pills left

## 2024-09-03 NOTE — Telephone Encounter (Signed)
 We do not have samples of Brilinta   Patient line just rings  Patient last seen by Dr.Branch 12/27/2022  Has apt scheduled in November with Dr.Branch  Refill sent to [facility] for Brilinta  60 mg #60, RF:2, sig: 1 tablet by mouth twice a day.

## 2024-09-03 NOTE — Telephone Encounter (Signed)
 Per Dr Glendia- may keep appointment with Dr Bluford next week as scheduled

## 2024-09-04 NOTE — Assessment & Plan Note (Signed)
 Has appointment scheduled with ENT.  Advised to use white noise to help combat the tinnitus at night when he is trying to sleep.

## 2024-09-04 NOTE — Assessment & Plan Note (Signed)
 After discussion with the patient and his daughter I have increasing losartan .  Follow-up in 1 week.  Will obtain repeat metabolic panel at that time.

## 2024-09-04 NOTE — Progress Notes (Signed)
 Subjective:  Patient ID: Peter [name], male    DOB: May 17, 1941  Age: 83 y.o. MRN: [id number]  CC:   Chief Complaint  Patient presents with   Follow-up    ER follow up     HPI:  83 year old male with the below mentioned medical problems presents for  ER follow-up.  Patient has been experiencing issues with tinnitus and also had recent markedly elevated blood pressure.  Was seen in the ER on 9/26.  BP was 173/83 in the ER.  Patient and his daughter are concerned about his elevated blood pressure readings.  He is currently on losartan  25 mg daily and metoprolol  25 mg daily.  He has underlying chronic kidney disease.  Most recent creatinine 1.45.  GFR 48.  Patient presents today for follow-up.  BP is mildly elevated here today but decently controlled given advanced age.  He is quite troubled by tinnitus.  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) 02/27/2023   Carotid stenosis 03/25/2022   Chronic systolic heart failure (HCC) 02/23/2022   Chronic idiopathic thrombocytopenia (HCC) 02/11/2022   Chronic kidney disease, stage 3a (HCC) 02/11/2022   Seborrheic keratoses 06/26/2020   NSTEMI (non-ST elevated myocardial infarction) (HCC) 02/24/2020   Chronic back pain 07/25/2018   CVA (cerebral vascular accident) (HCC) 04/14/2018   Claudication 04/12/2018   Coronary artery disease involving coronary bypass graft of native heart with angina pectoris 08/15/2014    PCI- LIMA-LAD insertion site DES 08/14/14 08/15/2014   COPD (chronic obstructive pulmonary disease) (HCC) 08/15/2014   Allergy to IVP dye 08/15/2014   Plavix allergy 08/15/2014   HTN (hypertension), benign 02/03/2014   Dyslipidemia 03/13/2013   Type 2 diabetes mellitus with stage 3b chronic kidney disease, without long-term current use of insulin  (HCC) 03/13/2013    Social Hx   Social History   Socioeconomic History   Marital  status: Married    Spouse name: Meade   Number of children: 2   Years of education: Not on file   Highest education level: Not on file  Occupational History   Not on file  Tobacco Use   Smoking status: Former    Current packs/day: 0.00    Average packs/day: 3.0 packs/day for 40.0 years (120.0 ttl pk-yrs)    Types: Cigarettes    Start date: 07/02/1960    Quit date: 07/02/2000    Years since quitting: 24.1   Smokeless tobacco: Never  Vaping Use   Vaping status: Never Used  Substance and Sexual Activity   Alcohol use: Not Currently   Drug use: No   Sexual activity: Not Currently    Birth control/protection: None  Other Topics Concern   Not on file  Social History Narrative   Married x 62 years. Cares for wife at home. Family assists.   Social Drivers of Corporate investment banker Strain: Low Risk  (08/23/2022)   Overall Financial Resource Strain (CARDIA)    Difficulty of Paying Living Expenses: Not hard at all  Food Insecurity: No Food Insecurity (08/23/2022)   Hunger Vital Sign    Worried About Running Out of Food in the Last Year: Never true    Ran Out of Food in the Last Year: Never true  Transportation Needs: No Transportation Needs (08/23/2022)   PRAPARE - Administrator, Civil Service (Medical): No    Lack of Transportation (Non-Medical): No  Physical Activity: Sufficiently  Active (08/23/2022)   Exercise Vital Sign    Days of Exercise per Week: 5 days    Minutes of Exercise per Session: 60 min  Stress: Stress Concern Present (08/23/2022)   [employer] of Occupational Health - Occupational Stress Questionnaire    Feeling of Stress : To some extent  Social Connections: Moderately Isolated (08/23/2022)   Social Connection and Isolation Panel    Frequency of Communication with Friends and Family: Three times a week    Frequency of Social Gatherings with Friends and Family: Twice a week    Attends Religious Services: Never    Programmer, systems: No    Attends Engineer, structural: Never    Marital Status: Married    Review of Systems Per HPI  Objective:  BP (!) 142/72   Pulse 84   Ht 6' (1.829 m)   Wt 176 lb (79.8 kg)   SpO2 94%   BMI 23.87 kg/m      09/04/2024    8:27 AM 09/03/2024    9:06 AM 08/30/2024    5:15 PM  BP/Weight  Systolic BP 142 159 177  Diastolic BP 72 75 98  Wt. (Lbs)  176   BMI  23.87 kg/m2     Physical Exam Vitals and nursing note reviewed.  Constitutional:      General: He is not in acute distress.    Appearance: Normal appearance.  HENT:     Head: Normocephalic and atraumatic.  Cardiovascular:     Rate and Rhythm: Normal rate and regular rhythm.  Pulmonary:     Effort: Pulmonary effort is normal.     Breath sounds: Normal breath sounds. No wheezing, rhonchi or rales.  Neurological:     Mental Status: He is alert.  Psychiatric:        Mood and Affect: Mood normal.        Behavior: Behavior normal.     Lab Results  Component Value Date   WBC 8.7 08/30/2024   HGB 14.1 08/30/2024   HCT 42.0 08/30/2024   PLT 128 (L) 08/30/2024   GLUCOSE 121 (H) 08/30/2024   CHOL 113 02/22/2023   TRIG 103 02/22/2023   HDL 44 02/22/2023   LDLCALC 50 02/22/2023   ALT 21 08/30/2024   AST 29 08/30/2024   NA 137 08/30/2024   K 4.0 08/30/2024   CL 102 08/30/2024   CREATININE 1.45 (H) 08/30/2024   BUN 17 08/30/2024   CO2 23 08/30/2024   TSH 5.036 (H) 08/30/2024   INR 1.2 06/03/2022   HGBA1C 6.2 (H) 06/12/2024     Assessment & Plan:  HTN (hypertension), benign Assessment & Plan: After discussion with the patient and his daughter I have increasing losartan .  Follow-up in 1 week.  Will obtain repeat metabolic panel at that time.  Orders: -     Losartan  Potassium; Take 1 tablet (50 mg total) by mouth daily.  Dispense: 90 tablet; Refill: 3 -     Basic metabolic panel with GFR  Bilateral tinnitus Assessment & Plan: Has appointment scheduled with ENT.  Advised to use  white noise to help combat the tinnitus at night when he is trying to sleep.     Follow-up: 1 week  Jacqulyn Ahle DO [facility]

## 2024-09-09 NOTE — Telephone Encounter (Signed)
 Pt has been  out since last week and [facility] said they want have any until the end of the week. Please advise

## 2024-09-10 ENCOUNTER — Encounter: Payer: Self-pay | Admitting: Family Medicine

## 2024-09-10 ENCOUNTER — Ambulatory Visit (INDEPENDENT_AMBULATORY_CARE_PROVIDER_SITE_OTHER): Admitting: Family Medicine

## 2024-09-10 VITALS — BP 140/70 | HR 61 | Ht 72.0 in | Wt 178.0 lb

## 2024-09-10 DIAGNOSIS — F5101 Primary insomnia: Secondary | ICD-10-CM | POA: Diagnosis not present

## 2024-09-10 DIAGNOSIS — I10 Essential (primary) hypertension: Secondary | ICD-10-CM

## 2024-09-10 DIAGNOSIS — G4700 Insomnia, unspecified: Secondary | ICD-10-CM | POA: Insufficient documentation

## 2024-09-10 MED ORDER — TRAZODONE HCL 50 MG PO TABS: 25.0000 mg | ORAL_TABLET | Freq: Every evening | ORAL | 3 refills | Status: DC | PRN

## 2024-09-10 NOTE — Progress Notes (Signed)
 Subjective:  Patient ID: Peter [name], male    DOB: [birth year]  Age: 83 y.o. MRN: [id number]  CC:   Chief Complaint  Patient presents with   Hypertension    One week follow up    HPI:  83 year old male presents for follow up.  BP 140/70 today. Needs BMP given recent increase in Losartan .  He reports ongoing insomnia. This is a longstanding issue. He daughter would like to discuss treatment options today to help him sleep. Mostly has trouble staying asleep.  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) 02/27/2023   Carotid stenosis 03/25/2022   Chronic systolic heart failure (HCC) 02/23/2022   Chronic idiopathic thrombocytopenia (HCC) 02/11/2022   Chronic kidney disease, stage 3a (HCC) 02/11/2022   Seborrheic keratoses 06/26/2020   NSTEMI (non-ST elevated myocardial infarction) (HCC) 02/24/2020   Chronic back pain 07/25/2018   CVA (cerebral vascular accident) (HCC) 04/14/2018   Claudication 04/12/2018   Coronary artery disease involving coronary bypass graft of native heart with angina pectoris 08/15/2014    PCI- LIMA-LAD insertion site DES 08/14/14 08/15/2014   COPD (chronic obstructive pulmonary disease) (HCC) 08/15/2014   Allergy to IVP dye 08/15/2014   Plavix allergy 08/15/2014   HTN (hypertension), benign 02/03/2014   Dyslipidemia 03/13/2013   Type 2 diabetes mellitus with stage 3b chronic kidney disease, without long-term current use of insulin  (HCC) 03/13/2013    Social Hx   Social History   Socioeconomic History   Marital status: Married    Spouse name: Meade   Number of children: 2   Years of education: Not on file   Highest education level: Not on file  Occupational History   Not on file  Tobacco Use   Smoking status: Former    Current packs/day: 0.00    Average packs/day: 3.0 packs/day for 40.0 years (120.0 ttl pk-yrs)    Types:  Cigarettes    Start date: 07/02/1960    Quit date: 07/02/2000    Years since quitting: 24.2   Smokeless tobacco: Never  Vaping Use   Vaping status: Never Used  Substance and Sexual Activity   Alcohol use: Not Currently   Drug use: No   Sexual activity: Not Currently    Birth control/protection: None  Other Topics Concern   Not on file  Social History Narrative   Married x 62 years. Cares for wife at home. Family assists.   Social Drivers of Corporate investment banker Strain: Low Risk  (08/23/2022)   Overall Financial Resource Strain (CARDIA)    Difficulty of Paying Living Expenses: Not hard at all  Food Insecurity: No Food Insecurity (08/23/2022)   Hunger Vital Sign    Worried About Running Out of Food in the Last Year: Never true    Ran Out of Food in the Last Year: Never true  Transportation Needs: No Transportation Needs (08/23/2022)   PRAPARE - Administrator, Civil Service (Medical): No    Lack of Transportation (Non-Medical): No  Physical Activity: Sufficiently Active (08/23/2022)   Exercise Vital Sign    Days of Exercise per Week: 5 days    Minutes of Exercise per Session: 60 min  Stress: Stress Concern Present (08/23/2022)   [employer] of Occupational Health - Occupational Stress Questionnaire    Feeling of Stress : To some extent  Social Connections: Moderately Isolated (08/23/2022)  Social Connection and Isolation Panel    Frequency of Communication with Friends and Family: Three times a week    Frequency of Social Gatherings with Friends and Family: Twice a week    Attends Religious Services: Never    Diplomatic Services operational officer: No    Attends Engineer, structural: Never    Marital Status: Married    Review of Systems Per HPI  Objective:  BP (!) 140/70   Pulse 61   Ht 6' (1.829 m)   Wt 178 lb (80.7 kg)   SpO2 98%   BMI 24.14 kg/m      09/10/2024    1:58 PM 09/10/2024    1:21 PM 09/04/2024    8:27 AM  BP/Weight   Systolic BP 140 152 142  Diastolic BP 70 77 72  Wt. (Lbs)  178   BMI  24.14 kg/m2     Physical Exam Vitals and nursing note reviewed.  Constitutional:      General: He is not in acute distress.    Appearance: Normal appearance.  HENT:     Head: Normocephalic and atraumatic.  Cardiovascular:     Rate and Rhythm: Normal rate and regular rhythm.  Pulmonary:     Effort: Pulmonary effort is normal.     Breath sounds: Normal breath sounds.  Neurological:     Mental Status: He is alert.     Lab Results  Component Value Date   WBC 8.7 08/30/2024   HGB 14.1 08/30/2024   HCT 42.0 08/30/2024   PLT 128 (L) 08/30/2024   GLUCOSE 121 (H) 08/30/2024   CHOL 113 02/22/2023   TRIG 103 02/22/2023   HDL 44 02/22/2023   LDLCALC 50 02/22/2023   ALT 21 08/30/2024   AST 29 08/30/2024   NA 137 08/30/2024   K 4.0 08/30/2024   CL 102 08/30/2024   CREATININE 1.45 (H) 08/30/2024   BUN 17 08/30/2024   CO2 23 08/30/2024   TSH 5.036 (H) 08/30/2024   INR 1.2 06/03/2022   HGBA1C 6.2 (H) 06/12/2024     Assessment & Plan:  HTN (hypertension), benign Assessment & Plan: BP stable. BMP today. Continue Losartan .  Orders: -     Basic metabolic panel with GFR  Primary insomnia Assessment & Plan: Uncontrolled. Trial of Trazodone.  Orders: -     traZODone HCl; Take 0.5-1 tablets (25-50 mg total) by mouth at bedtime as needed for sleep.  Dispense: 30 tablet; Refill: 3    Follow-up:  Advised follow up with PCP  Jacqulyn Ahle DO [facility]

## 2024-09-10 NOTE — Assessment & Plan Note (Addendum)
 Uncontrolled. Trial of Trazodone.

## 2024-09-10 NOTE — Patient Instructions (Addendum)
 Lab today.  Trazodone as prescribed.  Follow up with Dr. Glendia in the next few months.  Take care  Dr. Bluf[facility] Address: [address] [place], [place] [address] Phone: [phone]

## 2024-09-10 NOTE — Assessment & Plan Note (Signed)
 BP stable. BMP today. Continue Losartan .

## 2024-09-11 ENCOUNTER — Ambulatory Visit: Payer: Self-pay | Admitting: Family Medicine

## 2024-09-11 LAB — BASIC METABOLIC PANEL WITH GFR
BUN/Creatinine Ratio: 12 (ref 10–24)
BUN: 17 mg/dL (ref 8–27)
CO2: 22 mmol/L (ref 20–29)
Calcium: 9.2 mg/dL (ref 8.6–10.2)
Chloride: 104 mmol/L (ref 96–106)
Creatinine, Ser: 1.42 mg/dL — ABNORMAL HIGH (ref 0.76–1.27)
Glucose: 100 mg/dL — ABNORMAL HIGH (ref 70–99)
Potassium: 4.6 mmol/L (ref 3.5–5.2)
Sodium: 140 mmol/L (ref 134–144)
eGFR: 49 mL/min/1.73 — ABNORMAL LOW (ref >59)

## 2024-09-16 ENCOUNTER — Other Ambulatory Visit: Payer: Self-pay | Admitting: Family Medicine

## 2024-09-19 ENCOUNTER — Telehealth: Payer: Self-pay | Admitting: Family Medicine

## 2024-09-19 ENCOUNTER — Other Ambulatory Visit: Payer: Self-pay | Admitting: Family Medicine

## 2024-09-19 MED ORDER — METOPROLOL SUCCINATE ER 25 MG PO TB24: 25.0000 mg | ORAL_TABLET | Freq: Every day | ORAL | 3 refills | Status: AC

## 2024-09-19 NOTE — Telephone Encounter (Signed)
Refill sent in as requested. 

## 2024-09-19 NOTE — Telephone Encounter (Signed)
 Refill on     metoprolol  succinate (TOPROL -XL) 25 MG 24 hr tablet    [facility]

## 2024-09-30 ENCOUNTER — Ambulatory Visit (INDEPENDENT_AMBULATORY_CARE_PROVIDER_SITE_OTHER): Admitting: Otolaryngology

## 2024-09-30 ENCOUNTER — Encounter (INDEPENDENT_AMBULATORY_CARE_PROVIDER_SITE_OTHER): Payer: Self-pay | Admitting: Otolaryngology

## 2024-09-30 VITALS — BP 174/90 | HR 67 | Ht 72.0 in | Wt 172.0 lb

## 2024-09-30 DIAGNOSIS — H9313 Tinnitus, bilateral: Secondary | ICD-10-CM | POA: Diagnosis not present

## 2024-09-30 DIAGNOSIS — H903 Sensorineural hearing loss, bilateral: Secondary | ICD-10-CM

## 2024-09-30 DIAGNOSIS — H6121 Impacted cerumen, right ear: Secondary | ICD-10-CM

## 2024-09-30 NOTE — Progress Notes (Signed)
 Dear Dr. Mancil, Here is my assessment for our mutual patient, Peter [name]. Thank you for allowing me the opportunity to care for your patient. Please do not hesitate to contact me should you have any other questions. Sincerely, Dr. Eldora Blanch  Otolaryngology Clinic Note Referring provider: Dr. Mancil HPI:  Initial visit (09/2024): Discussed the use of AI scribe software for clinical note transcription with the patient, who gave verbal consent to proceed. History of Present Illness Peter [name] is an 83 year old male who presents with ringing in the ears.  He experiences persistent tinnitus for two months, mainly in the right ear but also in left ear, without pain, drainage, or vertigo. Getting worse. There is no history of ear infections or surgery. Lipoflavonoids have been tried, providing minimal relief. No recent hearing tests have been performed. He denies significant noise exposure or medication changes. The tinnitus sometimes disrupts sleep, becoming louder when lying down. There is no pulsatile quality Denies significant noise exposure. Patient additionally denies: deep pain in ear canal, eustachian tube symptoms such as popping, crackling, sensitive to pressure changes Patient also denies barotrauma, vestibular suppressant use, ototoxic medication use Prior ear surgery: no He saw Dr. Karis in past (2023), does not remember exact recommendations but noted SNHL   His medical history includes a CVA and CAD s/p surgery. e quit smoking thirty years ago   Personal or FHx of bleeding dz or anesthesia difficulty: no  GLP-1: no AP/AC: Brilinta , Warfarin(?)  Tobacco: prior, quit  PMHx: Carotid Artery Stenosis, AAA, HTN, CAD, CVA, CKD  Independent Review of Additional Tests or Records:  Peter [name] (08/07/2024): b/l tinnitus, impacted cerumen removed prior; persistent sx; worse at night; Dx: Tinnitus; Rx: ref to ENT Same provider 08/02/2024: noted tinnitus on right, cerumen  removed; melatonin for sleep CBC, CMP, TSH 08/30/2024: WBC 8.7, BUN/Cr 17/1.45; TSH 5.03 MRI Brain 02/11/2022 independently interpreted with respect to ears: done for pulsatile tinnitus(?); noted mastoid well aerated except small right mastoid effusion; IAC cuts show no noted IAC lesions Teoh outside audio 05/10/2022: b/l normal downsloping to severe SNHL AU; WRT 100% at 65dB; A/A tymps  PMH/Meds/All/SocHx/FamHx/ROS:   Past Medical History:  Diagnosis Date   AAA (abdominal aortic aneurysm)    Arthritis    CAD (coronary artery disease)    a. s/p CABG in 1997 b. stent to SVG-PDA and PTCA of LAD in 2003 c. cath in 2015 showing patent SVG-OM1-OM2, SVG-RI, SVG-D1, and LIMA-LAD with occluded SVG-PDA and severe stenosis of mid-LAD at LIMA insertion with DES placed d. 04/2018: similar results to 2015 with patent LAD stent; occlusion of small caliber D1 stenosis with medical management recommended.    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   Myocardial infarction [facility]) 1997   stents x2 (2003/2007)     Past Surgical History:  Procedure Laterality Date   CORONARY ANGIOPLASTY  2003/2007   2 stents   CORONARY ANGIOPLASTY WITH STENT PLACEMENT  08/14/2014   DES to LIMA-LAD insertion  CORONARY ARTERY BYPASS GRAFT  12/06/1995   7 vessels   CORONARY STENT INTERVENTION N/A 02/24/2020   Procedure: CORONARY STENT INTERVENTION;  Surgeon: Wonda Sharper, MD;  Location[facility] INVASIVE CV LAB;  Service: Cardiovascular;  Laterality: N/A;   CORONARY  STENT PLACEMENT  08/15/2014   MID LAD  DES  by Dr Wonda DOWNS ANGIOGRAPHY N/A 02/24/2020   Procedure: DOWNS ANGIOGRAPHY;  Surgeon: Wonda Sharper, MD;  Location: [facility] INVASIVE CV LAB;  Service: Cardiovascular;  Laterality: N/A;   CYSTECTOMY  12/06/2003   top of head-Jenkins   EAR CYST EXCISION  07/09/2012   Procedure: CYST REMOVAL;  Surgeon: Oneil DELENA Budge, MD;  Location: [facility] ORS;  Service: General;  Laterality: N/A;   EYE SURGERY Right    cataract removal and then another surgery after   HERNIA REPAIR     1980's inguinal Left side   LEFT HEART CATH AND CORS/GRAFTS ANGIOGRAPHY N/A 04/16/2018   Procedure: LEFT HEART CATH AND CORS/GRAFTS ANGIOGRAPHY;  Surgeon: Jordan, Peter M, MD;  Location: [facility] INVASIVE CV LAB;  Service: Cardiovascular;  Laterality: N/A;   LEFT HEART CATHETERIZATION WITH CORONARY/GRAFT ANGIOGRAM N/A 08/14/2014   Procedure: LEFT HEART CATHETERIZATION WITH DOWNS BILE;  Surgeon: Sharper JONETTA Wonda, MD;  Location: [facility] CATH LAB;  Service: Cardiovascular;  Laterality: N/A;   TEE WITHOUT CARDIOVERSION N/A 02/14/2022   Procedure: TRANSESOPHAGEAL ECHOCARDIOGRAM (TEE);  Surgeon: Hobart Powell BRAVO, MD;  Location: [facility] ORS;  Service: Cardiovascular;  Laterality: N/A;   TRANSCAROTID ARTERY REVASCULARIZATION  Left 03/25/2022   Procedure: LEFT TRANCAROTID ARTERY REVASCULARIZATION USING 10mm X 40mm ENROUTE STENT SYSTEM;  Surgeon: Sheree Penne Bruckner, MD;  Location: [facility] OR;  Service: Vascular;  Laterality: Left;   TRANSCAROTID ARTERY REVASCULARIZATION  Right 06/10/2022   Procedure: Right Transcarotid Artery Revascularization;  Surgeon: Sheree Penne Bruckner, MD;  Location: [facility] OR;  Service: Vascular;  Laterality: Right;   ULTRASOUND GUIDANCE FOR VASCULAR ACCESS N/A 03/25/2022   Procedure: ULTRASOUND GUIDANCE FOR VASCULAR ACCESS;  Surgeon: Sheree Penne Bruckner, MD;  Location: [facility] OR;  Service: Vascular;  Laterality: N/A;   ULTRASOUND GUIDANCE FOR VASCULAR  ACCESS Left 06/10/2022   Procedure: ULTRASOUND GUIDANCE FOR VASCULAR ACCESS, LEFT FEMORAL VEIN;  Surgeon: Sheree Penne Bruckner, MD;  Location: [facility] OR;  Service: Vascular;  Laterality: Left;    Family History  Problem Relation Age of Onset   Other Father        deceased after ?TCS or barium enema, age 23s   Hypertension Father    Heart attack Mother    Colon cancer Neg Hx    Liver disease Neg Hx      Social Connections: Moderately Isolated (08/23/2022)   Social Connection and Isolation Panel    Frequency of Communication with Friends and Family: Three times a week    Frequency of Social Gatherings with Friends and Family: Twice a week    Attends Religious Services: Never    Database Administrator or Organizations: No    Attends Engineer, Structural: Never    Marital Status: Married      Current Outpatient Medications:    ACCU-CHEK GUIDE TEST test strip, USE AS DIRECTED, Disp: 100 each, Rfl: 0   acetaminophen  (TYLENOL ) 500 MG tablet, Take 1,000 mg by mouth every 6 (six) hours as needed for mild pain., Disp: , Rfl:    Aloe Vera Juice LIQD, Take 8 oz by mouth daily., Disp: , Rfl:    aspirin  EC 81 MG tablet, Take 1 tablet (81 mg  total) by mouth daily with breakfast., Disp: 90 tablet, Rfl: 3   atorvastatin  (LIPITOR ) 80 MG tablet, Take 1 tablet by mouth once daily, Disp: 90 tablet, Rfl: 0   Blood Glucose Monitoring Suppl (ACCU-CHEK GUIDE) w/Device KIT, Use to check blood sugars BID DX. E11.9, Disp: 1 kit, Rfl: 0   Cholecalciferol (VITAMIN D3) 250 MCG (10000 UT) TABS, Take 10,000 Units by mouth 3 (three) times a week., Disp: , Rfl:    diphenhydrAMINE  (BENADRYL ) 50 MG capsule, Take one capsule 1 hour prior to scan., Disp: 1 capsule, Rfl: 0   losartan  (COZAAR ) 50 MG tablet, Take 1 tablet (50 mg total) by mouth daily., Disp: 90 tablet, Rfl: 3   metoprolol  succinate (TOPROL -XL) 25 MG 24 hr tablet, Take 1 tablet (25 mg total) by mouth daily., Disp: 90 tablet, Rfl: 3   ticagrelor   (BRILINTA ) 60 MG TABS tablet, Take 1 tablet (60 mg total) by mouth 2 (two) times daily., Disp: 60 tablet, Rfl: 2   traZODone (DESYREL) 50 MG tablet, Take 0.5-1 tablets (25-50 mg total) by mouth at bedtime as needed for sleep., Disp: 30 tablet, Rfl: 3   vitamin E 180 MG (400 UNITS) capsule, Take 400 Units by mouth See admin instructions. Twice month, Disp: , Rfl:    Physical Exam:   BP (!) 174/90 (BP Location: Right Arm, Patient Position: Sitting, Cuff Size: Large)   Pulse 67   Ht 6' (1.829 m)   Wt 172 lb (78 kg)   SpO2 94%   BMI 23.33 kg/m   Salient findings:  CN II-XII intact Given history and complaints, ear microscopy was indicated and performed for evaluation with findings as below in physical exam section and in procedures; Right ear cerumen impaction; after clearance, Bilateral EAC clear and TM intact with well pneumatized middle ear spaces Weber 512: mid Rinne 512: AC > BC b/l  Anterior rhinoscopy: Septum intact; bilateral inferior turbinates without significant hypertrophy No lesions of oral cavity/oropharynx No obviously palpable neck masses/lymphadenopathy/thyromegaly No respiratory distress or stridor  Seprately Identifiable Procedures:  Prior to initiating any procedures, risks/benefits/alternatives were explained to the patient and verbal consent obtained. Procedure: Bilateral ear microscopy and cerumen removal using microscope (CPT [phone]) - Mod 25 Pre-procedure diagnosis: Cerumen impaction right external ears Post-procedure diagnosis: same Indication: Right cerumen impaction; given patient's otologic complaints and history as well as for improved and comprehensive examination of external ear and tympanic membrane, bilateral otologic examination using microscope was performed and impacted cerumen removed  Procedure: Patient was placed semi-recumbent. Both ear canals were examined using the microscope with findings above. Impacted Cerumen removed on right using suction and  currette with improvement in EAC examination and patency. Patient tolerated the procedure well.      Impression & Plans:  Espen Bethel is a 83 y.o. male with:  1. Bilateral tinnitus   2. Sensorineural hearing loss (SNHL) of both ears   3. Impacted cerumen of right ear    Bilateral tinnitus, worsening, for two months, predominantly in the right ear. No ear pain, drainage, or vertigo. Cerumen removed from right ear. No tympanic membrane rupture or infection. 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 - Educated on potential causes, including presbycusis. - Discussed hearing aids but declined - Also discussed updated audio but declined. - Can consider tinnitus retraining  - Ultimately, after discussion options, patient opted to monitor  F/u PRN; Return precautions discussed  See below regarding exact medications prescribed this encounter including dosages and route: No orders of the defined types  were placed in this encounter.     Thank you for allowing me the opportunity to care for your patient. Please do not hesitate to contact me should you have any other questions.  Sincerely, Eldora Blanch, MD Otolaryngologist (ENT), [facility] Phone: [phone] Fax: [phone]  10/13/2024, 7:40 AM   MDM:  Level 4 - [phone] Complexity/Problems addressed: mod - chronic worsening problem Data complexity: high - independent review of MRI imaging, outside audiogram, review of note, labs - Morbidity: low  - Prescription Drug prescribed or managed: n

## 2024-10-16 ENCOUNTER — Encounter: Payer: Self-pay | Admitting: Family Medicine

## 2024-10-16 ENCOUNTER — Other Ambulatory Visit: Payer: Self-pay | Admitting: Family Medicine

## 2024-10-16 ENCOUNTER — Ambulatory Visit: Admitting: Family Medicine

## 2024-10-16 VITALS — BP 138/72 | HR 79 | Ht 72.0 in | Wt 177.0 lb

## 2024-10-16 DIAGNOSIS — E038 Other specified hypothyroidism: Secondary | ICD-10-CM | POA: Diagnosis not present

## 2024-10-16 DIAGNOSIS — I10 Essential (primary) hypertension: Secondary | ICD-10-CM | POA: Diagnosis not present

## 2024-10-16 DIAGNOSIS — E1122 Type 2 diabetes mellitus with diabetic chronic kidney disease: Secondary | ICD-10-CM

## 2024-10-16 DIAGNOSIS — F5101 Primary insomnia: Secondary | ICD-10-CM | POA: Diagnosis not present

## 2024-10-16 DIAGNOSIS — K5900 Constipation, unspecified: Secondary | ICD-10-CM

## 2024-10-16 DIAGNOSIS — H9311 Tinnitus, right ear: Secondary | ICD-10-CM | POA: Diagnosis not present

## 2024-10-16 DIAGNOSIS — N1831 Chronic kidney disease, stage 3a: Secondary | ICD-10-CM

## 2024-10-16 DIAGNOSIS — N1832 Chronic kidney disease, stage 3b: Secondary | ICD-10-CM

## 2024-10-16 DIAGNOSIS — M545 Low back pain, unspecified: Secondary | ICD-10-CM

## 2024-10-16 MED ORDER — HYDROCODONE-ACETAMINOPHEN 5-325 MG PO TABS: ORAL_TABLET | ORAL | 0 refills | Status: DC

## 2024-10-16 MED ORDER — ATORVASTATIN CALCIUM 80 MG PO TABS: 80.0000 mg | ORAL_TABLET | Freq: Every day | ORAL | 1 refills | Status: AC

## 2024-10-16 NOTE — Progress Notes (Signed)
   Subjective:    Patient ID: Peter [name], male    DOB: [birth year], 83 y.o.   MRN: [id number]  HPI HTN (hypertension), benign  Primary insomnia  Tinnitus of right ear  Subclinical hypothyroidism  Chronic kidney disease, stage 3a (HCC)  Type 2 diabetes mellitus with stage 3b chronic kidney disease, without long-term current use of insulin  (HCC)  Constipation, unspecified constipation type  Lumbar pain  He is taking his blood pressure medicine trying to be relatively healthy with eating habits is not that active he partly relates to being getting older but also being at home with not much area to walk  He does have a hard time falling asleep at night hard time staying asleep at night-tried trazodone but it causes drowsiness and made him feel weird so he stopped taking it  He does have ringing in the right ear was seen by ENT not much they could do for it we did discuss white moist technique  He has chronic kidney disease recent lab work stable  Does have type 2 diabetes currently controlled by diet recent A1c looked good  Mild constipation issues no blood in stool bowel movements are firm hard  Patient with longstanding lumbar pain Tylenol  does not do much for him he would like to resume hydrocodone  we did discuss how regular use of hydrocodone  unfortunately has more negatives than positives   Review of Systems     Objective:   Physical Exam  General-in no acute distress Eyes-no discharge Lungs-respiratory rate normal, CTA CV-no murmurs,RRR Extremities skin warm dry no edema Neuro grossly normal Behavior normal, alert       Assessment & Plan:  1. HTN (hypertension), benign (Primary) Blood pressure decent control within reason I would not recommend increasing dose  2. Primary insomnia Behavioral techniques discussed trazodone caused side effects I would not recommend any other prescription med  3. Tinnitus of right ear Patient is learning to live with has  already seen ENT  4. Subclinical hypothyroidism Will recheck labs in the spring time  5. Chronic kidney disease, stage 3a (HCC) Recheck labs in springtime continue losartan   6. Type 2 diabetes mellitus with stage 3b chronic kidney disease, without long-term current use of insulin  (HCC) Check A1c in the spring time continue healthy diet recent A1c is 6.2  7. Constipation, unspecified constipation type Add stool softener daily no blood in the stool give us  feedback in 3 weeks if not doing better consider MiraLAX , if ongoing troubles may need testing  8. Lumbar pain Intermittent lumbar pain and scrotum pain sometimes pain into the leg has had this before in the past was around high-dose hydrocodone  we will go with low-dose for intermittent occasional use  Patient was given Brilinta  through the manufacture this stops at the end of this year he is uncertain if he can afford the ongoing Brilinta

## 2024-10-18 ENCOUNTER — Other Ambulatory Visit: Payer: Self-pay | Admitting: Family Medicine

## 2024-10-24 ENCOUNTER — Encounter: Payer: Self-pay | Admitting: Family Medicine

## 2024-10-30 ENCOUNTER — Ambulatory Visit: Admitting: Cardiology

## 2024-11-05 ENCOUNTER — Telehealth: Payer: Self-pay | Admitting: Family Medicine

## 2024-11-05 NOTE — Telephone Encounter (Unsigned)
 Copied from CRM [id number]. Topic: Clinical - Medication Refill >> Nov 05, 2024  3:29 PM Peter [name] wrote: Medication: HYDROcodone -acetaminophen  (NORCO/VICODIN) 5-325 MG tablet  Has the patient contacted their pharmacy? Yes  This is the patient's preferred pharmacy:  [facility] [address] [place] - [address] [place] [address] Phone: [phone] Fax: [phone]  Is this the correct pharmacy for this prescription? Yes   Has the prescription been filled recently? Yes.Last refill was on 10/16/24  Is the patient out of the medication? Yes  Has the patient been seen for an appointment in the last year OR does the patient have an upcoming appointment? Yes. Last ov with Dr. Alphonsa was on 10/16/24  Can we respond through MyChart? No.Contact the patient by phone at [phone]  Agent: Please be advised that Rx refills may take up to 3 business days. We ask that you follow-up with your pharmacy.

## 2024-11-07 MED ORDER — HYDROCODONE-ACETAMINOPHEN 5-325 MG PO TABS: ORAL_TABLET | ORAL | 0 refills | Status: AC

## 2024-12-20 ENCOUNTER — Other Ambulatory Visit: Payer: Self-pay | Admitting: Family Medicine

## 2024-12-30 ENCOUNTER — Other Ambulatory Visit: Payer: Self-pay | Admitting: Cardiology

## 2024-12-30 NOTE — Telephone Encounter (Signed)
" °  Valid encounter within last 12 months  Cr in normal range and within 365 days  PLT in normal range and within 365 days  BMET 09/10/24 creatinine 1.42 PLT  08/30/24 128  Made patient an appointment in March with Dr. Alvan. Patient needs up to date lab work at appointment. Will refill medication up to his appointment. "

## 2025-02-04 ENCOUNTER — Ambulatory Visit: Admitting: Family Medicine

## 2025-02-20 ENCOUNTER — Ambulatory Visit

## 2025-03-03 ENCOUNTER — Ambulatory Visit: Admitting: Cardiology

## 2025-03-06 ENCOUNTER — Ambulatory Visit: Admitting: Family Medicine
